# Patient Record
Sex: Female | Born: 1955
Health system: Southern US, Community
[De-identification: ages and names within clinical notes are randomized; demographics above are authoritative.]

## PROBLEM LIST (undated history)

## (undated) DIAGNOSIS — M25569 Pain in unspecified knee: Secondary | ICD-10-CM

## (undated) DIAGNOSIS — J45909 Unspecified asthma, uncomplicated: Secondary | ICD-10-CM

## (undated) DIAGNOSIS — E119 Type 2 diabetes mellitus without complications: Secondary | ICD-10-CM

## (undated) DIAGNOSIS — Z8719 Personal history of other diseases of the digestive system: Secondary | ICD-10-CM

## (undated) DIAGNOSIS — I5189 Other ill-defined heart diseases: Secondary | ICD-10-CM

## (undated) DIAGNOSIS — M722 Plantar fascial fibromatosis: Secondary | ICD-10-CM

## (undated) DIAGNOSIS — I10 Essential (primary) hypertension: Secondary | ICD-10-CM

## (undated) DIAGNOSIS — M797 Fibromyalgia: Secondary | ICD-10-CM

## (undated) DIAGNOSIS — K8021 Calculus of gallbladder without cholecystitis with obstruction: Secondary | ICD-10-CM

## (undated) DIAGNOSIS — I509 Heart failure, unspecified: Secondary | ICD-10-CM

## (undated) DIAGNOSIS — G4730 Sleep apnea, unspecified: Secondary | ICD-10-CM

## (undated) DIAGNOSIS — O24419 Gestational diabetes mellitus in pregnancy, unspecified control: Secondary | ICD-10-CM

## (undated) DIAGNOSIS — Z79899 Other long term (current) drug therapy: Secondary | ICD-10-CM

## (undated) DIAGNOSIS — I499 Cardiac arrhythmia, unspecified: Secondary | ICD-10-CM

## (undated) DIAGNOSIS — I4891 Unspecified atrial fibrillation: Secondary | ICD-10-CM

## (undated) DIAGNOSIS — I4819 Other persistent atrial fibrillation: Secondary | ICD-10-CM

## (undated) DIAGNOSIS — M549 Dorsalgia, unspecified: Secondary | ICD-10-CM

## (undated) DIAGNOSIS — Z5181 Encounter for therapeutic drug level monitoring: Secondary | ICD-10-CM

## (undated) DIAGNOSIS — R109 Unspecified abdominal pain: Secondary | ICD-10-CM

## (undated) DIAGNOSIS — Q2112 Patent foramen ovale: Secondary | ICD-10-CM

## (undated) DIAGNOSIS — M1990 Unspecified osteoarthritis, unspecified site: Secondary | ICD-10-CM

## (undated) DIAGNOSIS — J189 Pneumonia, unspecified organism: Secondary | ICD-10-CM

## (undated) DIAGNOSIS — K8020 Calculus of gallbladder without cholecystitis without obstruction: Secondary | ICD-10-CM

## (undated) DIAGNOSIS — Z87442 Personal history of urinary calculi: Secondary | ICD-10-CM

## (undated) DIAGNOSIS — K219 Gastro-esophageal reflux disease without esophagitis: Secondary | ICD-10-CM

## (undated) HISTORY — DX: Unspecified asthma, uncomplicated: J45.909

## (undated) HISTORY — PX: APPENDECTOMY: SHX54

## (undated) HISTORY — PX: ABDOMINAL HYSTERECTOMY: SHX81

## (undated) HISTORY — DX: Unspecified osteoarthritis, unspecified site: M19.90

## (undated) HISTORY — DX: Calculus of gallbladder without cholecystitis without obstruction: K80.20

## (undated) HISTORY — DX: Dorsalgia, unspecified: M54.9

## (undated) HISTORY — DX: Pain in unspecified knee: M25.569

## (undated) HISTORY — DX: Unspecified atrial fibrillation: I48.91

## (undated) HISTORY — DX: Heart failure, unspecified: I50.9

## (undated) HISTORY — DX: Unspecified abdominal pain: R10.9

## (undated) HISTORY — DX: Other persistent atrial fibrillation: I48.19

## (undated) HISTORY — DX: Calculus of gallbladder without cholecystitis with obstruction: K80.21

## (undated) HISTORY — DX: Patent foramen ovale: Q21.12

---

## 2000-08-01 ENCOUNTER — Other Ambulatory Visit: Admission: RE | Admit: 2000-08-01 | Discharge: 2000-08-01 | Payer: Self-pay | Admitting: *Deleted

## 2001-06-07 HISTORY — PX: ABDOMINAL HYSTERECTOMY: SHX81

## 2015-02-22 ENCOUNTER — Emergency Department (HOSPITAL_COMMUNITY): Payer: Medicare Other

## 2015-02-22 ENCOUNTER — Inpatient Hospital Stay (HOSPITAL_COMMUNITY)
Admission: EM | Admit: 2015-02-22 | Discharge: 2015-02-26 | DRG: 418 | Disposition: A | Discharge status: Home / Self Care | Payer: Medicare Other | Attending: Internal Medicine | Admitting: Internal Medicine

## 2015-02-22 ENCOUNTER — Encounter (HOSPITAL_COMMUNITY): Payer: Self-pay | Admitting: Emergency Medicine

## 2015-02-22 DIAGNOSIS — K8020 Calculus of gallbladder without cholecystitis without obstruction: Secondary | ICD-10-CM

## 2015-02-22 DIAGNOSIS — Z9049 Acquired absence of other specified parts of digestive tract: Secondary | ICD-10-CM | POA: Diagnosis not present

## 2015-02-22 DIAGNOSIS — Z01810 Encounter for preprocedural cardiovascular examination: Secondary | ICD-10-CM

## 2015-02-22 DIAGNOSIS — I481 Persistent atrial fibrillation: Secondary | ICD-10-CM | POA: Diagnosis not present

## 2015-02-22 DIAGNOSIS — R1011 Right upper quadrant pain: Secondary | ICD-10-CM | POA: Diagnosis not present

## 2015-02-22 DIAGNOSIS — I480 Paroxysmal atrial fibrillation: Secondary | ICD-10-CM | POA: Diagnosis present

## 2015-02-22 DIAGNOSIS — M797 Fibromyalgia: Secondary | ICD-10-CM | POA: Diagnosis not present

## 2015-02-22 DIAGNOSIS — M179 Osteoarthritis of knee, unspecified: Secondary | ICD-10-CM | POA: Diagnosis present

## 2015-02-22 DIAGNOSIS — R339 Retention of urine, unspecified: Secondary | ICD-10-CM | POA: Diagnosis not present

## 2015-02-22 DIAGNOSIS — K5900 Constipation, unspecified: Secondary | ICD-10-CM | POA: Diagnosis not present

## 2015-02-22 DIAGNOSIS — R109 Unspecified abdominal pain: Secondary | ICD-10-CM | POA: Insufficient documentation

## 2015-02-22 DIAGNOSIS — K8001 Calculus of gallbladder with acute cholecystitis with obstruction: Secondary | ICD-10-CM | POA: Diagnosis present

## 2015-02-22 DIAGNOSIS — K8021 Calculus of gallbladder without cholecystitis with obstruction: Secondary | ICD-10-CM | POA: Insufficient documentation

## 2015-02-22 DIAGNOSIS — R030 Elevated blood-pressure reading, without diagnosis of hypertension: Secondary | ICD-10-CM | POA: Diagnosis present

## 2015-02-22 DIAGNOSIS — Z90710 Acquired absence of both cervix and uterus: Secondary | ICD-10-CM

## 2015-02-22 DIAGNOSIS — I4891 Unspecified atrial fibrillation: Secondary | ICD-10-CM | POA: Diagnosis not present

## 2015-02-22 DIAGNOSIS — Z6841 Body Mass Index (BMI) 40.0 and over, adult: Secondary | ICD-10-CM

## 2015-02-22 DIAGNOSIS — L299 Pruritus, unspecified: Secondary | ICD-10-CM | POA: Diagnosis not present

## 2015-02-22 DIAGNOSIS — I4892 Unspecified atrial flutter: Secondary | ICD-10-CM | POA: Diagnosis present

## 2015-02-22 HISTORY — DX: Calculus of gallbladder without cholecystitis without obstruction: K80.20

## 2015-02-22 HISTORY — DX: Fibromyalgia: M79.7

## 2015-02-22 HISTORY — DX: Gestational diabetes mellitus in pregnancy, unspecified control: O24.419

## 2015-02-22 LAB — CBC
HCT: 42.2 % (ref 36.0–46.0)
Hemoglobin: 14.3 g/dL (ref 12.0–15.0)
MCH: 30.8 pg (ref 26.0–34.0)
MCHC: 33.9 g/dL (ref 30.0–36.0)
MCV: 90.9 fL (ref 78.0–100.0)
PLATELETS: 230 10*3/uL (ref 150–400)
RBC: 4.64 MIL/uL (ref 3.87–5.11)
RDW: 12.7 % (ref 11.5–15.5)
WBC: 8.8 10*3/uL (ref 4.0–10.5)

## 2015-02-22 LAB — URINALYSIS, ROUTINE W REFLEX MICROSCOPIC
BILIRUBIN URINE: NEGATIVE
Glucose, UA: NEGATIVE mg/dL
Hgb urine dipstick: NEGATIVE
KETONES UR: 15 mg/dL — AB
LEUKOCYTES UA: NEGATIVE
NITRITE: NEGATIVE
PROTEIN: NEGATIVE mg/dL
Specific Gravity, Urine: 1.018 (ref 1.005–1.030)
UROBILINOGEN UA: 0.2 mg/dL (ref 0.0–1.0)
pH: 6.5 (ref 5.0–8.0)

## 2015-02-22 LAB — MRSA PCR SCREENING: MRSA BY PCR: NEGATIVE

## 2015-02-22 LAB — COMPREHENSIVE METABOLIC PANEL
ALT: 25 U/L (ref 14–54)
AST: 30 U/L (ref 15–41)
Albumin: 4.2 g/dL (ref 3.5–5.0)
Alkaline Phosphatase: 58 U/L (ref 38–126)
Anion gap: 7 (ref 5–15)
BILIRUBIN TOTAL: 0.7 mg/dL (ref 0.3–1.2)
BUN: 8 mg/dL (ref 6–20)
CHLORIDE: 106 mmol/L (ref 101–111)
CO2: 25 mmol/L (ref 22–32)
CREATININE: 0.84 mg/dL (ref 0.44–1.00)
Calcium: 9.3 mg/dL (ref 8.9–10.3)
Glucose, Bld: 149 mg/dL — ABNORMAL HIGH (ref 65–99)
Potassium: 3.7 mmol/L (ref 3.5–5.1)
Sodium: 138 mmol/L (ref 135–145)
TOTAL PROTEIN: 8.5 g/dL — AB (ref 6.5–8.1)

## 2015-02-22 LAB — LIPASE, BLOOD: LIPASE: 26 U/L (ref 22–51)

## 2015-02-22 LAB — TSH: TSH: 1.26 u[IU]/mL (ref 0.350–4.500)

## 2015-02-22 MED ORDER — SODIUM CHLORIDE 0.9 % IV BOLUS (SEPSIS)
1000.0000 mL | Freq: Once | INTRAVENOUS | Status: AC
  Administered 2015-02-22: 1000 mL via INTRAVENOUS

## 2015-02-22 MED ORDER — SODIUM CHLORIDE 0.9 % IV SOLN
INTRAVENOUS | Status: DC
  Administered 2015-02-25: 1000 mL via INTRAVENOUS

## 2015-02-22 MED ORDER — KETOROLAC TROMETHAMINE 30 MG/ML IJ SOLN
30.0000 mg | Freq: Once | INTRAMUSCULAR | Status: AC
  Administered 2015-02-22: 30 mg via INTRAVENOUS
  Filled 2015-02-22: qty 1

## 2015-02-22 MED ORDER — HEPARIN (PORCINE) IN NACL 100-0.45 UNIT/ML-% IJ SOLN
1700.0000 [IU]/h | INTRAMUSCULAR | Status: DC
  Administered 2015-02-22: 1200 [IU]/h via INTRAVENOUS
  Administered 2015-02-23: 1500 [IU]/h via INTRAVENOUS
  Administered 2015-02-24: 1700 [IU]/h via INTRAVENOUS
  Filled 2015-02-22 (×3): qty 250

## 2015-02-22 MED ORDER — ONDANSETRON 4 MG PO TBDP
ORAL_TABLET | ORAL | Status: AC
  Filled 2015-02-22: qty 1

## 2015-02-22 MED ORDER — SODIUM CHLORIDE 0.9 % IJ SOLN
3.0000 mL | Freq: Two times a day (BID) | INTRAMUSCULAR | Status: DC
  Administered 2015-02-23 – 2015-02-25 (×5): 3 mL via INTRAVENOUS

## 2015-02-22 MED ORDER — DILTIAZEM LOAD VIA INFUSION
10.0000 mg | Freq: Once | INTRAVENOUS | Status: DC
  Filled 2015-02-22: qty 10

## 2015-02-22 MED ORDER — ONDANSETRON 4 MG PO TBDP
4.0000 mg | ORAL_TABLET | Freq: Once | ORAL | Status: AC | PRN
  Administered 2015-02-22: 4 mg via ORAL

## 2015-02-22 MED ORDER — HYDROMORPHONE HCL 1 MG/ML IJ SOLN
1.0000 mg | Freq: Once | INTRAMUSCULAR | Status: AC
  Administered 2015-02-22: 1 mg via INTRAVENOUS
  Filled 2015-02-22: qty 1

## 2015-02-22 MED ORDER — MORPHINE SULFATE (PF) 4 MG/ML IV SOLN: 4.0000 mg | INTRAVENOUS | Status: DC | PRN

## 2015-02-22 MED ORDER — MORPHINE SULFATE (PF) 2 MG/ML IV SOLN: 1.0000 mg | INTRAVENOUS | Status: DC | PRN

## 2015-02-22 MED ORDER — MORPHINE SULFATE (PF) 4 MG/ML IV SOLN
4.0000 mg | Freq: Once | INTRAVENOUS | Status: AC
  Administered 2015-02-22: 4 mg via INTRAVENOUS
  Filled 2015-02-22: qty 1

## 2015-02-22 MED ORDER — DIPHENHYDRAMINE HCL 50 MG/ML IJ SOLN
25.0000 mg | Freq: Once | INTRAMUSCULAR | Status: AC
  Administered 2015-02-22: 25 mg via INTRAVENOUS
  Filled 2015-02-22: qty 1

## 2015-02-22 MED ORDER — ONDANSETRON HCL 4 MG/2ML IJ SOLN
4.0000 mg | Freq: Four times a day (QID) | INTRAMUSCULAR | Status: DC | PRN
  Administered 2015-02-23 – 2015-02-25 (×7): 4 mg via INTRAVENOUS
  Filled 2015-02-22 (×7): qty 2

## 2015-02-22 MED ORDER — KETOROLAC TROMETHAMINE 30 MG/ML IJ SOLN
30.0000 mg | Freq: Four times a day (QID) | INTRAMUSCULAR | Status: DC | PRN
  Administered 2015-02-22 – 2015-02-24 (×4): 30 mg via INTRAVENOUS
  Filled 2015-02-22 (×4): qty 1

## 2015-02-22 MED ORDER — HEPARIN BOLUS VIA INFUSION
4000.0000 [IU] | Freq: Once | INTRAVENOUS | Status: DC
  Filled 2015-02-22: qty 4000

## 2015-02-22 MED ORDER — DILTIAZEM HCL 25 MG/5ML IV SOLN
10.0000 mg | Freq: Once | INTRAVENOUS | Status: AC
  Administered 2015-02-22: 10 mg via INTRAVENOUS
  Filled 2015-02-22: qty 5

## 2015-02-22 MED ORDER — ONDANSETRON HCL 4 MG/2ML IJ SOLN
4.0000 mg | Freq: Once | INTRAMUSCULAR | Status: AC
  Administered 2015-02-22: 4 mg via INTRAVENOUS
  Filled 2015-02-22: qty 2

## 2015-02-22 MED ORDER — DILTIAZEM HCL 100 MG IV SOLR
5.0000 mg/h | INTRAVENOUS | Status: DC
  Administered 2015-02-22 (×2): 10 mg/h via INTRAVENOUS
  Administered 2015-02-22 (×2): 5 mg/h via INTRAVENOUS
  Administered 2015-02-22: 15 mg/h via INTRAVENOUS
  Administered 2015-02-23: 10 mg/h via INTRAVENOUS
  Filled 2015-02-22 (×2): qty 100

## 2015-02-22 MED ORDER — ONDANSETRON HCL 4 MG PO TABS
4.0000 mg | ORAL_TABLET | Freq: Four times a day (QID) | ORAL | Status: DC | PRN
  Administered 2015-02-26: 4 mg via ORAL
  Filled 2015-02-22: qty 1

## 2015-02-22 MED ORDER — DEXTROSE 5 % IV SOLN
1.0000 g | INTRAVENOUS | Status: DC
  Administered 2015-02-22 – 2015-02-23 (×2): 1 g via INTRAVENOUS
  Filled 2015-02-22 (×3): qty 10

## 2015-02-22 MED ORDER — PROMETHAZINE HCL 25 MG/ML IJ SOLN
12.5000 mg | Freq: Once | INTRAMUSCULAR | Status: AC
  Administered 2015-02-24: 12.5 mg via INTRAVENOUS
  Filled 2015-02-22 (×2): qty 1

## 2015-02-22 MED ORDER — METOPROLOL TARTRATE 1 MG/ML IV SOLN
10.0000 mg | Freq: Once | INTRAVENOUS | Status: AC
  Administered 2015-02-22: 10 mg via INTRAVENOUS
  Filled 2015-02-22: qty 10

## 2015-02-22 MED ORDER — MORPHINE SULFATE (PF) 2 MG/ML IV SOLN
1.0000 mg | INTRAVENOUS | Status: DC | PRN
  Administered 2015-02-23 – 2015-02-24 (×2): 2 mg via INTRAVENOUS
  Filled 2015-02-22 (×2): qty 1

## 2015-02-22 NOTE — ED Notes (Signed)
Pt HR reading 130 on monitor; pt placed on 12 lead and EKG captured, shown to MD. No recommendations at this time. Pt denies CP and SOB.

## 2015-02-22 NOTE — Consult Note (Signed)
Reason for Consult:abdominal pain, n/v Referring Physician: Cyrilla, [facility] Atkinson is an 59 y.o. female.  HPI: 59 yo obese WF with fibromyalgia awoke around 0300 with RUQ/epigastric pain that was constant that worsened throughout morning prompting her to come to [facility] ED. Pain was severe. Some radiation to back. No f/c. +n/V in ED. No prior symptoms. Normal BMs. No weight loss. No frequent NSAID use. No alcoholic stools. No melena/hematochezia. Late this pm, pt went into rapid Afib with RVR.   History reviewed. No pertinent past medical history.  Past Surgical History  Procedure Laterality Date  . Abdominal hysterectomy    . Appendectomy      History reviewed. No pertinent family history.  Social History:  reports that she has never smoked. She does not have any smokeless tobacco history on file. She reports that she drinks alcohol. She reports that she does not use illicit drugs.  Allergies:  Allergies  Allergen Reactions  . Other     Metals  . Sulfur     Medications: I have reviewed [facility] Atkinson's current medications.  Results for orders placed or performed during [facility] hospital encounter of 02/22/15 (from [facility] past 48 hour(s))  Urinalysis, Routine w reflex microscopic (not at [facility])     Status: Abnormal   Collection Time: 02/22/15 11:59 AM  Result Value Ref Range   Color, Urine YELLOW YELLOW   APPearance CLEAR CLEAR   Specific Gravity, Urine 1.018 1.005 - 1.030   pH 6.5 5.0 - 8.0   Glucose, UA NEGATIVE NEGATIVE mg/dL   Hgb urine dipstick NEGATIVE NEGATIVE   Bilirubin Urine NEGATIVE NEGATIVE   Ketones, ur 15 (A) NEGATIVE mg/dL   Protein, ur NEGATIVE NEGATIVE mg/dL   Urobilinogen, UA 0.2 0.0 - 1.0 mg/dL   Nitrite NEGATIVE NEGATIVE   Leukocytes, UA NEGATIVE NEGATIVE    Comment: MICROSCOPIC NOT DONE ON URINES WITH NEGATIVE PROTEIN, BLOOD, LEUKOCYTES, NITRITE, OR GLUCOSE <1000 mg/dL.  Lipase, blood     Status: None   Collection Time: 02/22/15 12:15 PM  Result Value Ref  Range   Lipase 26 22 - 51 U/L  Comprehensive metabolic panel     Status: Abnormal   Collection Time: 02/22/15 12:15 PM  Result Value Ref Range   Sodium 138 135 - 145 mmol/L   Potassium 3.7 3.5 - 5.1 mmol/L   Chloride 106 101 - 111 mmol/L   CO2 25 22 - 32 mmol/L   Glucose, Bld 149 (H) 65 - 99 mg/dL   BUN 8 6 - 20 mg/dL   Creatinine, Ser 0.84 0.44 - 1.00 mg/dL   Calcium 9.3 8.9 - 10.3 mg/dL   Total Protein 8.5 (H) 6.5 - 8.1 g/dL   Albumin 4.2 3.5 - 5.0 g/dL   AST 30 15 - 41 U/L   ALT 25 14 - 54 U/L   Alkaline Phosphatase 58 38 - 126 U/L   Total Bilirubin 0.7 0.3 - 1.2 mg/dL   GFR calc non Af Amer >60 >60 mL/min   GFR calc Af Amer >60 >60 mL/min    Comment: (NOTE) Christie eGFR has been calculated using Christie CKD EPI equation. This calculation has not been validated in all clinical situations. eGFR's persistently <60 mL/min signify possible Chronic Kidney Disease.    Anion gap 7 5 - 15  CBC     Status: None   Collection Time: 02/22/15 12:15 PM  Result Value Ref Range   WBC 8.8 4.0 - 10.5 K/uL   RBC 4.64 3.87 -  5.11 MIL/uL   Hemoglobin 14.3 12.0 - 15.0 g/dL   HCT 42.2 36.0 - 46.0 %   MCV 90.9 78.0 - 100.0 fL   MCH 30.8 26.0 - 34.0 pg   MCHC 33.9 30.0 - 36.0 g/dL   RDW 12.7 11.5 - 15.5 %   Platelets 230 150 - 400 K/uL    Dg Abd 1 View  02/22/2015   CLINICAL DATA:  Abdominal pain. Pt c/o generalized abdominal pain, nausea, of vomiting of yellow substance x today. Hx appendectomy, hysterectomy and liver biopsy.  EXAM: ABDOMEN - 1 VIEW  COMPARISON:  CT 10/20/2010  FINDINGS: Small bowel decompressed. Moderate proximal colonic fecal material, decompressed distally. Surgical clips in Christie left pelvis.  No abnormal abdominal calcifications.  Mild degenerative changes in Christie lower lumbar spine.  IMPRESSION: 1. Nonobstructive bowel gas pattern.   Electronically Signed   By: Lucrezia Europe M.D.   On: 02/22/2015 15:08   US Abdomen Complete  02/22/2015   CLINICAL DATA:  Pain right upper quadrant  radiating to left upper quadrant right flank since 3 a.m. with nausea and vomiting  EXAM: ULTRASOUND ABDOMEN COMPLETE  COMPARISON:  None.  FINDINGS: Gallbladder: 18 mm fell lodged in Christie neck of Christie gallbladder. Gallbladder wall is not thickened and there is no Murphy's sign.  Common bile duct: Diameter: 5 mm  Liver: Fatty infiltration  IVC: No abnormality visualized.  Pancreas: Visualized portion unremarkable.  Spleen: Size and appearance within normal limits.  Right Kidney: Length: 13.6 cm. Echogenicity within normal limits. No mass or hydronephrosis visualized.  Left Kidney: Length: 13.3 cm. Echogenicity within normal limits. No mass or hydronephrosis visualized.  Abdominal aorta: No aneurysm visualized.  Other findings: None.  IMPRESSION: Cholelithiasis with nonmobile stone lodged in Christie neck of Christie gallbladder.   Electronically Signed   By: Skipper Cliche M.D.   On: 02/22/2015 15:15    Review of Systems  Constitutional: Negative for fever, chills and weight loss.  HENT: Negative for nosebleeds.   Eyes: Negative for blurred vision.       Spots when have migraines   Respiratory: Positive for shortness of breath.   Cardiovascular: Negative for chest pain, palpitations, orthopnea and PND.       Denies DOE  Gastrointestinal: Positive for nausea, vomiting and abdominal pain. Negative for diarrhea and constipation.  Genitourinary: Negative for dysuria and hematuria.       Has "very large" bladder and "will eventually lose ability to empty bladder". No self cath   Musculoskeletal: Positive for joint pain (has b/l knee pain).       Has fibromyalgia; got injections in knees recently  Skin: Negative for itching and rash.  Neurological: Negative for dizziness, focal weakness, seizures, loss of consciousness and headaches.       Denies TIAs, amaurosis fugax; +h/o migraines  Endo/Heme/Allergies: Does not bruise/bleed easily.  Psychiatric/Behavioral: Christie Atkinson is not nervous/anxious.    Blood pressure  140/94, pulse 102, temperature 97.7 F (36.5 C), temperature source Oral, resp. rate 18, height 5' 6.5" (1.689 m), weight 117.935 kg (260 lb), SpO2 94 %. Physical Exam  Vitals reviewed. Constitutional: She is oriented to person, place, and time. She appears well-developed and well-nourished. No distress.  obese  HENT:  Head: Normocephalic and atraumatic.  Right Ear: External ear normal.  Left Ear: External ear normal.  Eyes: Conjunctivae are normal. No scleral icterus.  Neck: Normal range of motion. Neck supple. No tracheal deviation present. No thyromegaly present.  Cardiovascular: Normal heart sounds.  An irregular rhythm present. Tachycardia present.   Respiratory: Effort normal and breath sounds normal. No stridor. No respiratory distress. She has no wheezes.  GI: Soft. Normal appearance. There is no rigidity, no rebound and no guarding. No hernia.  Very MILD TTP in RUQ. No rt/guarding. Old transverse incision  Musculoskeletal: She exhibits no edema or tenderness.  Lymphadenopathy:    She has no cervical adenopathy.  Neurological: She is alert and oriented to person, place, and time. She exhibits normal muscle tone.  Skin: Skin is warm and dry. No rash noted. She is not diaphoretic. No erythema. No pallor.  Psychiatric: She has a normal mood and affect. Her behavior is normal. Judgment and thought content normal.    Assessment/Plan: Symptomatic cholelithiasis vs acute calculous cholecystitis New onset Afib Morbid obesity Fibromyalgia Anticoagulated  Unclear if pt has acute cholecystitis. No wall thickening, fluid on u/s. Normal labs. Min tenderness on exam. Could be early acute cholecystitis.  Unclear why pt developed afib. -?cholecystitis For now, would cover for cholecystitis with IV rocephin Need work up for afib and cardiology consult Need cardiology to weigh in Can always check HIDA to confirm cholecystitis Can have clears from my standpoint. Would make NPO after  midnight for possible HIDA scan Sunday Repeat labs in am Right now not anticipating cholecystectomy on Sunday  Will follow  Leighton Ruff. Redmond Pulling, MD, FACS General, Bariatric, & Minimally Invasive Surgery [facility], [place]   [facility] M 02/22/2015, 7:18 PM

## 2015-02-22 NOTE — Progress Notes (Signed)
Ok to run cardizem and heparin together if connected at lowest point.

## 2015-02-22 NOTE — ED Provider Notes (Signed)
CSN: [id number]     Arrival date & time 02/22/15  1133 History   First MD Initiated Contact with Patient 02/22/15 1214     Chief Complaint  Patient presents with  . Abdominal Pain     (Consider location/radiation/quality/duration/timing/severity/associated sxs/prior Treatment) The history is provided by the patient.     Patient is a 59 year-old female with history of abdominal hysterectomy, appendectomy, distended and dysfunctional bladder, fibromyalgia, who presents to the emergency room with abdominal pain, nausea, vomiting and diarrhea. She states that yesterday she had dark diarrhea but she denies hematochezia, melena. At approximately 4 AM today, she woke up with sudden onset right upper quadrant abdominal pain that radiated across her abdomen and to her back. The pain has been constant since it's onset and associated with abdominal distention and sweats.  She has gotten some relief with standing and leaning over forward.  She has not eaten since the onset of the abdominal pain and she has never experienced anything like this before. She states that her vomiting was yellow, she denies hematemesis.   She complains of urinary retention which is chronic due to bladder sling complication. She states that she never has complete bladder emptying and does feel very distended currently. She has been told that she will need to catheter herself eventually, and she is currently requesting a Foley catheter be placed, and also requesting a urology consult.  She denies dysuria and hematuria. The patient claimed that she has not passed gas since yesterday and has not had a bowel movement since yesterday.   She denies any chest pain, shortness of breath, fever, chills, sweats.  She denies any sick contacts. She states that she has had some dyspepsia and bloating for several months, but she has not yet seen a physician to have it evaluated.  She has not other complaints at this time.  History reviewed. No  pertinent past medical history. Past Surgical History  Procedure Laterality Date  . Abdominal hysterectomy    . Appendectomy     History reviewed. No pertinent family history. Social History  Substance Use Topics  . Smoking status: Never Smoker   . Smokeless tobacco: None  . Alcohol Use: Yes     Comment: little   OB History    No data available     Review of Systems  Constitutional: Negative.   HENT: Negative.   Eyes: Negative.   Respiratory: Negative.  Negative for cough, chest tightness, shortness of breath and wheezing.   Cardiovascular: Negative.  Negative for chest pain, palpitations and leg swelling.  Endocrine: Negative.   Genitourinary: Positive for difficulty urinating. Negative for dysuria, urgency, frequency, hematuria, flank pain, pelvic pain and dyspareunia.  Musculoskeletal: Positive for myalgias.  Skin: Negative.   Neurological: Negative for dizziness, tremors, syncope, weakness, light-headedness and headaches.  Hematological: Negative.   Psychiatric/Behavioral: Negative.       Allergies  Other and Sulfur  Home Medications   Prior to Admission medications   Not on File   BP 144/101 mmHg  Pulse 53  Temp(Src) 97.7 F (36.5 C) (Oral)  Resp 18  Ht 5' 6.5" (1.689 m)  Wt 260 lb (117.935 kg)  BMI 41.34 kg/m2  SpO2 94% Physical Exam  Constitutional: She is oriented to person, place, and time. Vital signs are normal. She appears well-developed and well-nourished. She is cooperative. No distress.  Obese female, appears stated age, appears uncomfortable writhing around in bed then standing and leaning over bed, moaning, NAD  HENT:  Head: Normocephalic and atraumatic.  Nose: Nose normal.  Mouth/Throat: Oropharynx is clear and moist. No oropharyngeal exudate.  Eyes: Conjunctivae and EOM are normal. Pupils are equal, round, and reactive to light. Right eye exhibits no discharge. Left eye exhibits no discharge. No scleral icterus.  Neck: Normal range of  motion. No JVD present. No tracheal deviation present. No thyromegaly present.  Cardiovascular: Normal rate, regular rhythm, normal heart sounds and intact distal pulses.  Exam reveals no gallop and no friction rub.   No murmur heard. Pulses:      Radial pulses are 2+ on the right side, and 2+ on the left side.       Dorsalis pedis pulses are 2+ on the right side, and 2+ on the left side.  No LE edema, symmetrical pulses radial and DP 2+, regular rate and rhythm.    Pulmonary/Chest: Effort normal and breath sounds normal. No respiratory distress. She has no wheezes. She has no rales. She exhibits no tenderness.  Abdominal: Soft. Bowel sounds are normal. She exhibits no mass. There is tenderness in the right upper quadrant, epigastric area and left upper quadrant. There is guarding. There is no rigidity, no rebound, no CVA tenderness and negative Murphy's sign. No hernia.  Abdomen obese, mildly distended, exam limited by body habitus  Musculoskeletal: Normal range of motion. She exhibits no edema or tenderness.  Lymphadenopathy:    She has no cervical adenopathy.  Neurological: She is alert and oriented to person, place, and time. She has normal reflexes. No cranial nerve deficit. She exhibits normal muscle tone. Coordination normal.  Skin: Skin is warm and dry. No rash noted. She is not diaphoretic. No cyanosis or erythema. No pallor. Nails show no clubbing.  Psychiatric: She has a normal mood and affect. Her behavior is normal. Judgment and thought content normal.  Nursing note and vitals reviewed.     ED Course  Procedures (including critical care time) Labs Review Labs Reviewed  COMPREHENSIVE METABOLIC PANEL - Abnormal; Notable for the following:    Glucose, Bld 149 (*)    Total Protein 8.5 (*)    All other components within normal limits  URINALYSIS, ROUTINE W REFLEX MICROSCOPIC (NOT AT [facility]) - Abnormal; Notable for the following:    Ketones, ur 15 (*)    All other components within  normal limits  LIPASE, BLOOD  CBC    Imaging Review Dg Abd 1 View  02/22/2015   CLINICAL DATA:  Abdominal pain. Pt c/o generalized abdominal pain, nausea, of vomiting of yellow substance x today. Hx appendectomy, hysterectomy and liver biopsy.  EXAM: ABDOMEN - 1 VIEW  COMPARISON:  CT 10/20/2010  FINDINGS: Small bowel decompressed. Moderate proximal colonic fecal material, decompressed distally. Surgical clips in the left pelvis.  No abnormal abdominal calcifications.  Mild degenerative changes in the lower lumbar spine.  IMPRESSION: 1. Nonobstructive bowel gas pattern.   Electronically Signed   By: Lucrezia Europe M.D.   On: 02/22/2015 15:08   US Abdomen Complete  02/22/2015   CLINICAL DATA:  Pain right upper quadrant radiating to left upper quadrant right flank since 3 a.m. with nausea and vomiting  EXAM: ULTRASOUND ABDOMEN COMPLETE  COMPARISON:  None.  FINDINGS: Gallbladder: 18 mm fell lodged in the neck of the gallbladder. Gallbladder wall is not thickened and there is no Murphy's sign.  Common bile duct: Diameter: 5 mm  Liver: Fatty infiltration  IVC: No abnormality visualized.  Pancreas: Visualized portion unremarkable.  Spleen: Size and appearance within normal  limits.  Right Kidney: Length: 13.6 cm. Echogenicity within normal limits. No mass or hydronephrosis visualized.  Left Kidney: Length: 13.3 cm. Echogenicity within normal limits. No mass or hydronephrosis visualized.  Abdominal aorta: No aneurysm visualized.  Other findings: None.  IMPRESSION: Cholelithiasis with nonmobile stone lodged in the neck of the gallbladder.   Electronically Signed   By: Skipper Cliche M.D.   On: 02/22/2015 15:15   I have personally reviewed and evaluated these images and lab results as part of my medical decision-making.   EKG Interpretation   Date/Time:  Saturday February 22 2015 15:46:59 EDT Ventricular Rate:  133 PR Interval:    QRS Duration: 87 QT Interval:  325 QTC Calculation: 483 R Axis:   88 Text  Interpretation:  AtTrial fibrillation with RVR  No prior EKG for  comparison Confirmed by LIU MD, DANA [phone]) on 02/22/2015 3:51:28 PM      MDM   Final diagnoses:  Abdominal pain  Calculus of gallbladder with biliary obstruction but without cholecystitis    Abdominal pain, sudden onset this am, constant with N/V Abdominal x-ray reviewed normal gas bowel patterns, no obstruction  Abdominal ultrasound revealed nonmobile stone in gallbladder neck, measuring 18 mm, without gallbladder thickening, negative Murphy's sign.   Labs were largely unremarkable, the patient does not have a white count, electrolytes are within normal limits, no elevated LFTs, normal lipase UA negative for UTI The patient continued to have abdominal pain with nausea and vomiting, she was given another dose of morphine and Zofran.  Vitals at 1545 revealed tachycardia (which was new, pt had previously had regular pulse recorded and palpated in the 50-60's) patient was subsequently placed a 5-lead and EKG obtained which showed Afib with RVR, patient did feel palpitations and lightheadedness, but denies chest pain, blood pressure is stable, 135/89, respirations 14, unlabored The patient denies any cardiac history.  Another liter IV fluids was administered with more aggressive pain and nausea treatment.  Will observe the pt to see if she spontaneously converts.  Dilaudid cause pruritis, pt given benadryl for itch Pt had no improvement in HR/rhythm, after an hour of fluids, the pt was sleeping with HR 122-141 and BP 143/104 - 144/101. Pt's was discussed with Dr. Oleta Mouse, the new attending on shift, will add diltiazem.  Calls out to general surgery for cholelithiasis/early cholecystitis with nonmobile/lodged gallstone Also called unassigned for admission for new onset A. fib, pain and nausea control. They may need to assist with serial abdominal exams to watch for progression of cholecystitis.  Dr. Redmond Pulling from surgery will  consult Internal medicine to admit for new onset A. fib, RUQ abdominal pain, Veneda Melter, PA-C 02/24/15 [place], DO 02/26/15 [id number]

## 2015-02-22 NOTE — Consult Note (Addendum)
CARDIOLOGY CONSULT NOTE   Patient ID: Christie Atkinson MRN: [id number], DOB/AGE: 06-Nov-1955   Admit date: 02/22/2015 Date of Consult: 02/23/2015  Primary Physician: No primary care provider on file.  Reason for Consult:  Afib  HPI:  59 yo F w a h/o fibromyalgia & chronic urinary retention was admitted this evening with a 1 day h/o RUQ pain, N/V, & diarrhea, found to have evidence of an 18 mm obstructive gallstone in the biliary tract.  Per surgical consultation, it is unclear if her presentation is consistent with cholecystitis with a potential HIDA planned for tomorrow.  Though initial vitals did not reveal tachycardia, HR at 15:45 revealed a HR of 125, when an EKG was obtained & revealed Afib with RVR as well as nonspecific ST-T abnormalities.  She was asymptomatic & HD stable.  While in the ED, she was treated with Diltiazem 10 mg IV, Metoprolol 10 mg IV, Morphine 4 mg, Dilaudid 1 mg, Benadryl 25 mg IV, Zofran x 2, & NS x 2 L.  She is currently receiving a Diltiazem gtt at 5 mg/hr.    She was unaware of ever having Afib previously.  She stated that she last saw a cardiologist in the 2000's (unable to recall the name) for CP for which she underwent a stress test that was unremarkable.  The pain was ultimately attributed to pleurisy but has not recurred since then.  At baseline, her physical activity is limited by knee OA & Fibromyalgia for which she is on disability.  She is able to garden comfortably but has occasional dyspnea when climbing the stairs in her home.  This is chronic & stable.  Otherwise, she denied chest pain, palpitations, lightheadedness, lower extremity swelling, orthopnea, or paroxysmal nocturnal dyspnea.  She consumes 2 large cups of coffee daily but denied other stimulants.     Problem List  History reviewed. No pertinent past medical history.  Past Surgical History  Procedure Laterality Date  . Abdominal hysterectomy    . Appendectomy      Allergies  Allergies    Allergen Reactions  . Other     Metals  . Sulfur    Inpatient Medications  . cefTRIAXone (ROCEPHIN)  IV  1 g Intravenous Q24H  . diltiazem  10 mg Intravenous Once  . heparin  4,000 Units Intravenous Once  . promethazine  12.5 mg Intravenous Once  . sodium chloride  3 mL Intravenous Q12H   Family History History reviewed. No pertinent family history.   Social History Social History   Social History  . Marital Status: Married    Spouse Name: N/A  . Number of Children: N/A  . Years of Education: N/A   Occupational History  . Not on file.   Social History Main Topics  . Smoking status: Never Smoker   . Smokeless tobacco: Not on file  . Alcohol Use: Yes     Comment: little  . Drug Use: No  . Sexual Activity: Not on file   Other Topics Concern  . Not on file   Social History Narrative  . No narrative on file    Review of Systems:  A 12 point ROS was negative except as indicated in HPI.  Physical Exam  Blood pressure 101/61, pulse 94, temperature 98.6 F (37 C), temperature source Oral, resp. rate 14, height 5' 6.5" (1.689 m), weight 260 lb 5.8 oz (118.1 kg), SpO2 95 %.  General:  Pleasant, NAD Psych:  Normal affect. Neuro:  Alert and oriented  X 3. Moves all extremities spontaneously. HEENT:  Normal  Neck:   Supple without bruits or JVD. Lungs:   Resp regular and unlabored, CTA. Heart: I rregularly irregular, no s3, s4, or murmurs. Abdomen:  Soft, RUQ TTP, non-distended, BS + x 4.  Extremities: No clubbing, cyanosis or edema. DP/PT/Radials 2+ and equal bilaterally.  Labs  No results for input(s): CKTOTAL, CKMB, TROPONINI in the last 72 hours. Lab Results  Component Value Date   WBC 8.8 02/22/2015   HGB 14.3 02/22/2015   HCT 42.2 02/22/2015   MCV 90.9 02/22/2015   PLT 230 02/22/2015    Recent Labs Lab 02/22/15 1215  NA 138  K 3.7  CL 106  CO2 25  BUN 8  CREATININE 0.84  CALCIUM 9.3  PROT 8.5*  BILITOT 0.7  ALKPHOS 58  ALT 25  AST 30   GLUCOSE 149*   No results found for: CHOL, HDL, LDLCALC, TRIG No results found for: DDIMER  Radiology/Studies  Dg Abd 1 View  02/22/2015   CLINICAL DATA:  Abdominal pain. Pt c/o generalized abdominal pain, nausea, of vomiting of yellow substance x today. Hx appendectomy, hysterectomy and liver biopsy.  EXAM: ABDOMEN - 1 VIEW  COMPARISON:  CT 10/20/2010  FINDINGS: Small bowel decompressed. Moderate proximal colonic fecal material, decompressed distally. Surgical clips in the left pelvis.  No abnormal abdominal calcifications.  Mild degenerative changes in the lower lumbar spine.  IMPRESSION: 1. Nonobstructive bowel gas pattern.   Electronically Signed   By: Lucrezia Europe M.D.   On: 02/22/2015 15:08   US Abdomen Complete  02/22/2015   CLINICAL DATA:  Pain right upper quadrant radiating to left upper quadrant right flank since 3 a.m. with nausea and vomiting  EXAM: ULTRASOUND ABDOMEN COMPLETE  COMPARISON:  None.  FINDINGS: Gallbladder: 18 mm fell lodged in the neck of the gallbladder. Gallbladder wall is not thickened and there is no Murphy's sign.  Common bile duct: Diameter: 5 mm  Liver: Fatty infiltration  IVC: No abnormality visualized.  Pancreas: Visualized portion unremarkable.  Spleen: Size and appearance within normal limits.  Right Kidney: Length: 13.6 cm. Echogenicity within normal limits. No mass or hydronephrosis visualized.  Left Kidney: Length: 13.3 cm. Echogenicity within normal limits. No mass or hydronephrosis visualized.  Abdominal aorta: No aneurysm visualized.  Other findings: None.  IMPRESSION: Cholelithiasis with nonmobile stone lodged in the neck of the gallbladder.   Electronically Signed   By: Skipper Cliche M.D.   On: 02/22/2015 15:15   59 yo F w a h/o fibromyalgia & chronic urinary retention was admitted this evening with a 1 day h/o RUQ pain, N/V, & diarrhea, found to have evidence of an 18 mm obstructive gallstone in the biliary tract, course complicated by Afib.    # Atrial  fibrillation - This appears to have started during her time in the ED, likely related to catecholamine release from her pain.  CHA2DS2-VASc = 1 (based on gender).  Her TSH is wnl.   - Will plan for an echocardiogram tomorrow to rule-out structural disease contributing to her Afib.   - As long as her HR is controlled, the Afib would not preclude her from undergoing surgery. - Based on CHA2DS2-VASc = 1, she would be a candidate for ASA or anticoagulation.  Without either of these, she would have a 1.3% annual risk of CVA. - Overall, we would advise pain control & observing on telemetry while her abdominal pain is being further worked up during which time  the Heparin gtt can be continued.  If it is deemed that surgery is necessary, we can further discuss timing for discontinuation of the Heparin gtt.  Based on "Perioperative Bridging Anticoagulation in Patients with Atrial Fibrillation" (NEJM 2015), perioperative risk of arterial embolization in AF is <0.5%. - If Afib is persistent, she would not be a candidate for DCCV prior to surgery as this mandates 30 days of anticoagulation.  However, this could be considered after surgery once she is able to be anticoagulated. - Continue Heparin & Diltiazem gtts for now.   - This sounds complicated, but the overall message is to continue workup of her potential cholecystitis, & we will continue to follow with you as we collaborate to risk stratify her from an Afib & surgical perspective.  # Pre-operative Risk Assessment - Her Revised cardiac risk index = 0 for this intermidiate risk procedure, meaning that her risk of MACE is < 1%.  Her baseline METS are ~ 4.   - There would be no benefit to stress testing this patient.   - As per above, we will plan for a resting echocardiogram.    # Intermittent blood pressure elevation ED without a diagnosis of hypertension - This occurred in the ED & may have been related to pain.   - Will continue to  monitor.  Signed, Alfonso Ramus, MD 02/23/2015, 1:25 AM

## 2015-02-22 NOTE — ED Notes (Signed)
Pt reports woke up about 3 am with a bad smell in her room that caused her to feel nausea. Pt reports abdominal pain and back pain.

## 2015-02-22 NOTE — H&P (Signed)
Date: 02/22/2015               Patient Name:  Christie Atkinson MRN: [id number]  DOB: Jun 21, 1955 Age / Sex: 59 y.o., female   PCP: No primary care provider on file.         Medical Service: Internal Medicine Teaching Service         Attending Physician: Dr. Sid Falcon, MD    First Contact: Dr. Benjamine Mola Pager: [phone]  Second Contact: Dr. Genene Churn Pager: [phone]       After Hours (After 5p/  First Contact Pager: [phone]  weekends / holidays): Second Contact Pager: [phone]   Chief Complaint: Abdominal pain  History of Present Illness: 59 year old woman with history of gestational diabetes, total hysterectomy, appendectomy, osteoarthritis, fibromyalgia present to the emergency department with right upper quadrant abdominal pain, nausea, vomiting, and diarrhea. She was generally feeling well yesterday although did note a single episode of loose foul-smelling stool. She states that she woke up around 4 AM this morning to a malodorous smell and right upper quadrant pain. She felt significant abdominal pressure and attempted but was unable to pass a bowel movement. Pain was severe and accompanied by sweating. It is partially relieved by leaning forward. Since that time she has multiple episodes of vomiting mostly of yellow color and gastric contents. Denies any fevers chills or generalized pain except in her right upper quadrant and right flank. At the emergency department right upper quadrant ultrasound demonstrated an obstructing 18 mm gallstone in the biliary duct. She was then noted to go into A. fib with RVR with a pulse maximum of 153. Pulse improved with pain control with IV morphine and Dilaudid. Also Toradol and intravenous metoprolol administered with incomplete resolution. Heart rate of 110 and hemodynamically stable patient was started on diltiazem drip.  Meds: Current Facility-Administered Medications  Medication Dose Route Frequency Provider Last Rate Last Dose  . 0.9 %  sodium  chloride infusion   Intravenous Continuous Tasrif Ahmed, MD      . diltiazem (CARDIZEM) 1 mg/mL load via infusion 10 mg  10 mg Intravenous Once Tasrif Ahmed, MD      . diltiazem (CARDIZEM) 100 mg in dextrose 5 % 100 mL (1 mg/mL) infusion  5-15 mg/hr Intravenous Titrated Tasrif Ahmed, MD      . heparin ADULT infusion 100 units/mL (25000 units/250 mL)  1,200 Units/hr Intravenous Continuous Harvel Quale, RPH 12 mL/hr at 02/22/15 1856 1,200 Units/hr at 02/22/15 1856  . heparin bolus via infusion 4,000 Units  4,000 Units Intravenous Once [employer], [facility]      . morphine 2 MG/ML injection 1 mg  1 mg Intravenous Q4H PRN Tasrif Ahmed, MD      . ondansetron (ZOFRAN) tablet 4 mg  4 mg Oral Q6H PRN Tasrif Ahmed, MD       Or  . ondansetron (ZOFRAN) injection 4 mg  4 mg Intravenous Q6H PRN Tasrif Ahmed, MD      . promethazine (PHENERGAN) injection 12.5 mg  12.5 mg Intravenous Once Leisa Tapia, PA-C      . sodium chloride 0.9 % injection 3 mL  3 mL Intravenous Q12H Tasrif Ahmed, MD        Allergies: Allergies as of 02/22/2015 - Review Complete 02/22/2015  Allergen Reaction Noted  . Other  02/22/2015  . Sulfur  02/22/2015   History reviewed. No pertinent past medical history. Past Surgical History  Procedure Laterality Date  . Abdominal hysterectomy    .  Appendectomy     History reviewed. No pertinent family history. Social History   Social History  . Marital Status: Married    Spouse Name: N/A  . Number of Children: N/A  . Years of Education: N/A   Occupational History  . Not on file.   Social History Main Topics  . Smoking status: Never Smoker   . Smokeless tobacco: Not on file  . Alcohol Use: Yes     Comment: little  . Drug Use: No  . Sexual Activity: Not on file   Other Topics Concern  . Not on file   Social History Narrative  . No narrative on file    Review of Systems: Review of Systems  Constitutional: Positive for diaphoresis. Negative for fever and chills.    Eyes: Negative for blurred vision and double vision.  Respiratory: Negative for shortness of breath.   Cardiovascular: Negative for chest pain and leg swelling.  Gastrointestinal: Positive for nausea and vomiting.  Genitourinary:       Retention  Musculoskeletal: Positive for joint pain.  Skin: Negative for itching.  Neurological: Negative for dizziness, loss of consciousness and headaches.  Endo/Heme/Allergies: Does not bruise/bleed easily.   Physical Exam: Blood pressure 140/94, pulse 102, temperature 97.7 F (36.5 C), temperature source Oral, resp. rate 18, height 5' 6.5" (1.689 m), weight 117.935 kg (260 lb), SpO2 94 %. GENERAL- alert, co-operative, obese, NAD HEENT- pupils constricted bilaterally, oral mucosa appears moist, good and intact dentition CARDIAC- RRR, no murmurs, rubs or gallops. RESP- CTAB, no wheezes or crackles. ABDOMEN- Soft, right upper quadrant tenderness to palpation without rebound, normoactive bowel sounds present throughout BACK- Normal curvature, no paraspinal tenderness, no CVA tenderness. EXTREMITIES- pulse 2+, symmetric, no pedal edema. SKIN- Warm, dry, No rash or lesion. PSYCH- euphoric mood and affect, tangential thought content and speech  Lab results: Basic Metabolic Panel:  Recent Labs  02/22/15 1215  NA 138  K 3.7  CL 106  CO2 25  GLUCOSE 149*  BUN 8  CREATININE 0.84  CALCIUM 9.3   Liver Function Tests:  Recent Labs  02/22/15 1215  AST 30  ALT 25  ALKPHOS 58  BILITOT 0.7  PROT 8.5*  ALBUMIN 4.2    Recent Labs  02/22/15 1215  LIPASE 26   No results for input(s): AMMONIA in the last 72 hours. CBC:  Recent Labs  02/22/15 1215  WBC 8.8  HGB 14.3  HCT 42.2  MCV 90.9  PLT 230   Cardiac Enzymes: No results for input(s): CKTOTAL, CKMB, CKMBINDEX, TROPONINI in the last 72 hours. BNP: No results for input(s): PROBNP in the last 72 hours. D-Dimer: No results for input(s): DDIMER in the last 72 hours. CBG: No  results for input(s): GLUCAP in the last 72 hours. Hemoglobin A1C: No results for input(s): HGBA1C in the last 72 hours. Fasting Lipid Panel: No results for input(s): CHOL, HDL, LDLCALC, TRIG, CHOLHDL, LDLDIRECT in the last 72 hours. Thyroid Function Tests: No results for input(s): TSH, T4TOTAL, FREET4, T3FREE, THYROIDAB in the last 72 hours. Anemia Panel: No results for input(s): VITAMINB12, FOLATE, FERRITIN, TIBC, IRON, RETICCTPCT in the last 72 hours. Coagulation: No results for input(s): LABPROT, INR in the last 72 hours. Urine Drug Screen: Drugs of Abuse  No results found for: LABOPIA, COCAINSCRNUR, LABBENZ, AMPHETMU, THCU, LABBARB  Alcohol Level: No results for input(s): ETH in the last 72 hours. Urinalysis:  Recent Labs  02/22/15 1159  COLORURINE YELLOW  LABSPEC 1.018  PHURINE 6.5  GLUCOSEU NEGATIVE  HGBUR NEGATIVE  BILIRUBINUR NEGATIVE  KETONESUR 15*  PROTEINUR NEGATIVE  UROBILINOGEN 0.2  NITRITE NEGATIVE  LEUKOCYTESUR NEGATIVE    Imaging results:  Dg Abd 1 View  02/22/2015   CLINICAL DATA:  Abdominal pain. Pt c/o generalized abdominal pain, nausea, of vomiting of yellow substance x today. Hx appendectomy, hysterectomy and liver biopsy.  EXAM: ABDOMEN - 1 VIEW  COMPARISON:  CT 10/20/2010  FINDINGS: Small bowel decompressed. Moderate proximal colonic fecal material, decompressed distally. Surgical clips in the left pelvis.  No abnormal abdominal calcifications.  Mild degenerative changes in the lower lumbar spine.  IMPRESSION: 1. Nonobstructive bowel gas pattern.   Electronically Signed   By: Lucrezia Europe M.D.   On: 02/22/2015 15:08   US Abdomen Complete  02/22/2015   CLINICAL DATA:  Pain right upper quadrant radiating to left upper quadrant right flank since 3 a.m. with nausea and vomiting  EXAM: ULTRASOUND ABDOMEN COMPLETE  COMPARISON:  None.  FINDINGS: Gallbladder: 18 mm fell lodged in the neck of the gallbladder. Gallbladder wall is not thickened and there is no  Murphy's sign.  Common bile duct: Diameter: 5 mm  Liver: Fatty infiltration  IVC: No abnormality visualized.  Pancreas: Visualized portion unremarkable.  Spleen: Size and appearance within normal limits.  Right Kidney: Length: 13.6 cm. Echogenicity within normal limits. No mass or hydronephrosis visualized.  Left Kidney: Length: 13.3 cm. Echogenicity within normal limits. No mass or hydronephrosis visualized.  Abdominal aorta: No aneurysm visualized.  Other findings: None.  IMPRESSION: Cholelithiasis with nonmobile stone lodged in the neck of the gallbladder.   Electronically Signed   By: Skipper Cliche M.D.   On: 02/22/2015 15:15    Other results: EKG: A. fib with RVR heart rate 90-100  Assessment & Plan by Problem: Symptomatic cholelithiasis 18 mm obstructive gallstone identified on right upper quadrant ultrasound. General surgery consult for evaluation of patient. Intervention on current A. fib will need to be planned accordingly as is his stone is high risk for cholecystitis if not resolved. - Morphine 1-2 mg IV every 2 hours when necessary - Zofran 4 mg every 6 hours when necessary - F/U AM CBC, CMP  A. fib with RVR New onset atrial fibrillation and patient not on chronic anticoagulation. Likely secondary to acute pain from obstructive gallstone. No other contributory etiologies at this time. Cardiology consulted for management recs. Patient hemodynamically stable and admitted on drips. - Unfractionated Heparin infusion - Cardizem drip 5-15 mg/hr - TSH - F/U AM EKG - Cardiac monitoring  Diet: NPO DVT ppx: On heparin drip FULL CODE  Dispo: Disposition is deferred at this time, awaiting improvement of current medical problems.   The patient does not have a current PCP (No primary care provider on file.) and does not know need an [facility] hospital follow-up appointment after discharge.  The patient does not know have transportation limitations that hinder transportation to clinic  appointments.  Signed: Collier Salina, MD 02/22/2015, 7:16 PM

## 2015-02-22 NOTE — Progress Notes (Addendum)
ANTICOAGULATION CONSULT NOTE - Initial Consult  Pharmacy Consult for heparin Indication: atrial fibrillation  Allergies  Allergen Reactions  . Other     Metals  . Sulfur     Patient Measurements: Height: 5' 6.5" (168.9 cm) Weight: 260 lb (117.935 kg) IBW/kg (Calculated) : 60.45 Heparin Dosing Weight: 88.2 kg  Vital Signs: Temp: 97.7 F (36.5 C) (09/17 1140) Temp Source: Oral (09/17 1140) BP: 134/87 mmHg (09/17 1715) Pulse Rate: 102 (09/17 1715)  Labs:  Recent Labs  02/22/15 1215  HGB 14.3  HCT 42.2  PLT 230  CREATININE 0.84    Estimated Creatinine Clearance: 93.9 mL/min (by C-G formula based on Cr of 0.84).   Medical History: History reviewed. No pertinent past medical history.  Medications:   (Not in a hospital admission)  Assessment: 59 yo female admitted with new onset AFib. No anticoagulation PTA. CBC stable, SCr 0.8, eCrCl 90 ml/min.  Goal of Therapy:  Heparin level 0.3-0.7 units/ml Monitor platelets by anticoagulation protocol: Yes   Plan:  -Heparin 4000 units x1 then 1200 units/hr -Daily HL, CBC -Monitor s/sx bleeding -First heparin level this evening    Hughes Better, PharmD, BCPS Clinical Pharmacist Pager: [phone] 02/22/2015 5:38 PM   ADDN: Initial HL is subtherapeutic at 0.13 on heparin 1200 units/hr. Nurse reports no issues with infusion or bleeding.  Plan: Bolus heparin 2500 units and increase rate to 1500 units/hr 6h HL  Andrey Cota. Diona Foley, PharmD Clinical Pharmacist Pager [phone]

## 2015-02-22 NOTE — ED Notes (Signed)
After 10 mg of cardizem pt hr from 154 to 91, new EKG captured.

## 2015-02-22 NOTE — ED Notes (Signed)
Pt bladder scan revealed <70 cc urine. Spoke with PA. Not doing foley catheter.

## 2015-02-23 ENCOUNTER — Inpatient Hospital Stay (HOSPITAL_COMMUNITY): Payer: Medicare Other

## 2015-02-23 DIAGNOSIS — R030 Elevated blood-pressure reading, without diagnosis of hypertension: Secondary | ICD-10-CM | POA: Diagnosis present

## 2015-02-23 DIAGNOSIS — K8021 Calculus of gallbladder without cholecystitis with obstruction: Secondary | ICD-10-CM | POA: Insufficient documentation

## 2015-02-23 DIAGNOSIS — M797 Fibromyalgia: Secondary | ICD-10-CM

## 2015-02-23 DIAGNOSIS — Z01810 Encounter for preprocedural cardiovascular examination: Secondary | ICD-10-CM

## 2015-02-23 DIAGNOSIS — R109 Unspecified abdominal pain: Secondary | ICD-10-CM | POA: Insufficient documentation

## 2015-02-23 DIAGNOSIS — I4891 Unspecified atrial fibrillation: Secondary | ICD-10-CM

## 2015-02-23 DIAGNOSIS — I481 Persistent atrial fibrillation: Secondary | ICD-10-CM

## 2015-02-23 DIAGNOSIS — K8020 Calculus of gallbladder without cholecystitis without obstruction: Secondary | ICD-10-CM | POA: Insufficient documentation

## 2015-02-23 HISTORY — DX: Elevated blood-pressure reading, without diagnosis of hypertension: R03.0

## 2015-02-23 HISTORY — DX: Unspecified atrial fibrillation: I48.91

## 2015-02-23 HISTORY — DX: Encounter for preprocedural cardiovascular examination: Z01.810

## 2015-02-23 LAB — CBC WITH DIFFERENTIAL/PLATELET
BASOS ABS: 0 10*3/uL (ref 0.0–0.1)
BASOS PCT: 0 %
EOS ABS: 0 10*3/uL (ref 0.0–0.7)
EOS PCT: 0 %
HCT: 41.7 % (ref 36.0–46.0)
Hemoglobin: 14 g/dL (ref 12.0–15.0)
Lymphocytes Relative: 15 %
Lymphs Abs: 1.8 10*3/uL (ref 0.7–4.0)
MCH: 30.8 pg (ref 26.0–34.0)
MCHC: 33.6 g/dL (ref 30.0–36.0)
MCV: 91.9 fL (ref 78.0–100.0)
MONO ABS: 1.2 10*3/uL — AB (ref 0.1–1.0)
Monocytes Relative: 10 %
Neutro Abs: 9 10*3/uL — ABNORMAL HIGH (ref 1.7–7.7)
Neutrophils Relative %: 75 %
PLATELETS: 192 10*3/uL (ref 150–400)
RBC: 4.54 MIL/uL (ref 3.87–5.11)
RDW: 12.9 % (ref 11.5–15.5)
WBC: 12 10*3/uL — AB (ref 4.0–10.5)

## 2015-02-23 LAB — COMPREHENSIVE METABOLIC PANEL
ALBUMIN: 3.5 g/dL (ref 3.5–5.0)
ALK PHOS: 57 U/L (ref 38–126)
ALT: 25 U/L (ref 14–54)
ANION GAP: 9 (ref 5–15)
AST: 32 U/L (ref 15–41)
BILIRUBIN TOTAL: 0.9 mg/dL (ref 0.3–1.2)
BUN: 6 mg/dL (ref 6–20)
CALCIUM: 8.9 mg/dL (ref 8.9–10.3)
CO2: 22 mmol/L (ref 22–32)
Chloride: 106 mmol/L (ref 101–111)
Creatinine, Ser: 0.89 mg/dL (ref 0.44–1.00)
GFR calc Af Amer: >60 mL/min (ref >60)
GLUCOSE: 143 mg/dL — AB (ref 65–99)
Potassium: 3.9 mmol/L (ref 3.5–5.1)
Sodium: 137 mmol/L (ref 135–145)
TOTAL PROTEIN: 7.5 g/dL (ref 6.5–8.1)

## 2015-02-23 LAB — HEPARIN LEVEL (UNFRACTIONATED)
Heparin Unfractionated: 0.13 IU/mL — ABNORMAL LOW (ref 0.30–0.70)
Heparin Unfractionated: 0.24 IU/mL — ABNORMAL LOW (ref 0.30–0.70)
Heparin Unfractionated: 0.3 IU/mL (ref 0.30–0.70)

## 2015-02-23 MED ORDER — DILTIAZEM HCL ER COATED BEADS 240 MG PO CP24: 240.0000 mg | ORAL_CAPSULE | Freq: Every day | ORAL | Status: DC

## 2015-02-23 MED ORDER — POTASSIUM CHLORIDE CRYS ER 20 MEQ PO TBCR
40.0000 meq | EXTENDED_RELEASE_TABLET | Freq: Two times a day (BID) | ORAL | Status: DC
  Administered 2015-02-23 – 2015-02-25 (×6): 40 meq via ORAL
  Filled 2015-02-23 (×8): qty 2

## 2015-02-23 MED ORDER — DILTIAZEM HCL 60 MG PO TABS
60.0000 mg | ORAL_TABLET | Freq: Four times a day (QID) | ORAL | Status: DC
  Administered 2015-02-23 – 2015-02-25 (×8): 60 mg via ORAL
  Filled 2015-02-23 (×8): qty 1

## 2015-02-23 MED ORDER — MAGNESIUM SULFATE IN D5W 10-5 MG/ML-% IV SOLN
1.0000 g | Freq: Once | INTRAVENOUS | Status: AC
  Administered 2015-02-23: 1 g via INTRAVENOUS
  Filled 2015-02-23: qty 100

## 2015-02-23 MED ORDER — DOCUSATE SODIUM 50 MG/5ML PO LIQD: 50.0000 mg | Freq: Every day | ORAL | Status: DC | PRN

## 2015-02-23 MED ORDER — HEPARIN BOLUS VIA INFUSION
2500.0000 [IU] | Freq: Once | INTRAVENOUS | Status: AC
  Administered 2015-02-23: 2500 [IU] via INTRAVENOUS
  Filled 2015-02-23: qty 2500

## 2015-02-23 MED ORDER — DOCUSATE SODIUM 50 MG PO CAPS
50.0000 mg | ORAL_CAPSULE | Freq: Every day | ORAL | Status: DC | PRN
  Filled 2015-02-23: qty 1

## 2015-02-23 MED ORDER — DIPHENHYDRAMINE HCL 50 MG/ML IJ SOLN
12.5000 mg | Freq: Once | INTRAMUSCULAR | Status: AC
  Administered 2015-02-23: 12.5 mg via INTRAVENOUS
  Filled 2015-02-23: qty 1

## 2015-02-23 NOTE — Progress Notes (Signed)
*  PRELIMINARY RESULTS* Echocardiogram 2D Echocardiogram has been performed.  Leavy Cella 02/23/2015, 3:55 PM

## 2015-02-23 NOTE — Progress Notes (Signed)
  Date: 02/23/2015  Patient name: CHANIYAH JAHR  Medical record number: [id number]  Date of birth: Oct 06, 1955   I have seen and evaluated [name] and discussed their care with the Residency Team.  Briefly, Ms. Tadros is a 59yo woman with PMH of gestational DM, osteoarthritis, fibromyalgia who presented to the emergency department with RUQ pain in her abdomen, nausea, vomiting and diarrhea. She notes feeling well prior to pain starting.  She had one episode of loose stool on the day prior to admission.  On the day of admission, she was awoken early by a bad smell and pain in her abdomen.  She had associated symptoms including diaphoresis, vomiting.  The pain was 10/10 and better with leaning forward.  The pain was continuous.  She denied any blood in the emesis or stool, fevers, chills, other sites of pain. She denied any chest pain.   In the ED, she was found to have an obstructing gallstone by ultrasound.  Likely due to pain, she went in to Afib with RVR.  Her pain was treated with IV morphine, dilaudid, toradol.  She was started on a diltiazem drip.   Exam:  Gen: Alert, NAD Eyes: Anicteric, EOMI HENT: NCAT, no oropharyngeal exudate CVS: Irreg Irreg, no murmur noted, pulses palpable in upper extremities Pulm: CTAB at bases, no wheezing Abd: Pain free when I saw her, low frequency of bowel sounds, obese Ext: No edema or erythema Neuro: Grossly intact, strength normal.   Pertinent data LFTs normal except for mildly elevated Protein, WBC normal, lipase normal  US Abdomen Complete  02/22/2015 CLINICAL DATA: Pain right upper quadrant radiating to left upper quadrant right flank since 3 a.m. with nausea and vomiting EXAM: ULTRASOUND ABDOMEN COMPLETE COMPARISON: None. FINDINGS: Gallbladder: 18 mm fell lodged in the neck of the gallbladder. Gallbladder wall is not thickened and there is no Murphy's sign. Common bile duct: Diameter: 5 mm Liver: Fatty infiltration IVC: No abnormality  visualized. Pancreas: Visualized portion unremarkable. Spleen: Size and appearance within normal limits. Right Kidney: Length: 13.6 cm. Echogenicity within normal limits. No mass or hydronephrosis visualized. Left Kidney: Length: 13.3 cm. Echogenicity within normal limits. No mass or hydronephrosis visualized. Abdominal aorta: No aneurysm visualized. Other findings: None. IMPRESSION: Cholelithiasis with nonmobile stone lodged in the neck of the gallbladder. Electronically Signed By: Skipper Cliche M.D. On: 02/22/2015 15:15   EKG reviewed: Afib   Assessment and Plan: I have seen and evaluated the patient as outlined above. I saw the patient with the resident team.   1. Symptomatic cholelithiasis - Gallstone seen on ultrasound - Surgery consulted, requested HIDA scan which will be ordered - Morphine PRN for pain - Zofran for nausea - Trend WBC and LFTs - Will likely need surgery given size of gallstone; possibility of passing the stone is likely small  2. New onset Afib with RVR - Cardiology consulted - Heparin infusion started given new onset - Cardizem drip, transition to PO when able - check TSH - AM EKG - Consider cardioversion  3. Fibromyalgia, pain - morphine as noted above  Sid Falcon, MD 9/18/201610:59 AM

## 2015-02-23 NOTE — Progress Notes (Signed)
Subjective: Patient doing well this morning with some improvement in right upper quadrant abdominal pain, reporting of mild at this time. No chest pain or shortness of breath. No fevers chills, does endorse some nausea without vomiting well controlled on Zofran when necessary.  Objective: Vital signs in last 24 hours: Filed Vitals:   02/23/15 0800 02/23/15 0900 02/23/15 1000 02/23/15 1308  BP: 131/108 107/66 97/49 123/97  Pulse: 81 93 80 88  Temp: 99.7 F (37.6 C)   98.6 F (37 C)  TempSrc: Axillary   Oral  Resp: 19 25 17 18   Height:      Weight:      SpO2:   98% 96%   Weight change:   Intake/Output Summary (Last 24 hours) at 02/23/15 1414 Last data filed at 02/23/15 1300  Gross per 24 hour  Intake 1774.23 ml  Output   3050 ml  Net -1275.77 ml   GENERAL- alert, co-operative, obese, NAD HEENT- pupils constricted bilaterally, oral mucosa appears moist, good and intact dentition CARDIAC- RRR, no murmurs, rubs or gallops. RESP- CTAB, no wheezes or crackles. ABDOMEN- Soft, right upper quadrant tenderness to palpation without rebound, normoactive bowel sounds present throughout BACK- Normal curvature, no paraspinal tenderness, no CVA tenderness. EXTREMITIES- pulse 2+, symmetric, no pedal edema. SKIN- Warm, dry, No rash or lesion. PSYCH- euphoric mood and affect, tangential thought content and speech   Lab Results: Basic Metabolic Panel:  Recent Labs Lab 02/22/15 1215 02/23/15 0619  NA 138 137  K 3.7 3.9  CL 106 106  CO2 25 22  GLUCOSE 149* 143*  BUN 8 6  CREATININE 0.84 0.89  CALCIUM 9.3 8.9   Liver Function Tests:  Recent Labs Lab 02/22/15 1215 02/23/15 0619  AST 30 32  ALT 25 25  ALKPHOS 58 57  BILITOT 0.7 0.9  PROT 8.5* 7.5  ALBUMIN 4.2 3.5    Recent Labs Lab 02/22/15 1215  LIPASE 26   No results for input(s): AMMONIA in the last 168 hours. CBC:  Recent Labs Lab 02/22/15 1215 02/23/15 0619  WBC 8.8 12.0*  NEUTROABS  --  9.0*  HGB 14.3  14.0  HCT 42.2 41.7  MCV 90.9 91.9  PLT 230 192   Cardiac Enzymes: No results for input(s): CKTOTAL, CKMB, CKMBINDEX, TROPONINI in the last 168 hours. BNP: No results for input(s): PROBNP in the last 168 hours. D-Dimer: No results for input(s): DDIMER in the last 168 hours. CBG: No results for input(s): GLUCAP in the last 168 hours. Hemoglobin A1C: No results for input(s): HGBA1C in the last 168 hours. Fasting Lipid Panel: No results for input(s): CHOL, HDL, LDLCALC, TRIG, CHOLHDL, LDLDIRECT in the last 168 hours. Thyroid Function Tests:  Recent Labs Lab 02/22/15 1954  TSH 1.260   Coagulation: No results for input(s): LABPROT, INR in the last 168 hours. Anemia Panel: No results for input(s): VITAMINB12, FOLATE, FERRITIN, TIBC, IRON, RETICCTPCT in the last 168 hours. Urine Drug Screen: Drugs of Abuse  No results found for: LABOPIA, COCAINSCRNUR, LABBENZ, AMPHETMU, THCU, LABBARB  Alcohol Level: No results for input(s): ETH in the last 168 hours. Urinalysis:  Recent Labs Lab 02/22/15 1159  COLORURINE YELLOW  LABSPEC 1.018  PHURINE 6.5  GLUCOSEU NEGATIVE  HGBUR NEGATIVE  BILIRUBINUR NEGATIVE  KETONESUR 15*  PROTEINUR NEGATIVE  UROBILINOGEN 0.2  NITRITE NEGATIVE  LEUKOCYTESUR NEGATIVE    Micro Results: Recent Results (from the past 240 hour(s))  MRSA PCR Screening     Status: None   Collection Time: 02/22/15  6:45 PM  Result Value Ref Range Status   MRSA by PCR NEGATIVE NEGATIVE Final    Comment:        The GeneXpert MRSA Assay (FDA approved for NASAL specimens only), is one component of a comprehensive MRSA colonization surveillance program. It is not intended to diagnose MRSA infection nor to guide or monitor treatment for MRSA infections.    Studies/Results: Dg Abd 1 View  02/22/2015   CLINICAL DATA:  Abdominal pain. Pt c/o generalized abdominal pain, nausea, of vomiting of yellow substance x today. Hx appendectomy, hysterectomy and liver biopsy.   EXAM: ABDOMEN - 1 VIEW  COMPARISON:  CT 10/20/2010  FINDINGS: Small bowel decompressed. Moderate proximal colonic fecal material, decompressed distally. Surgical clips in the left pelvis.  No abnormal abdominal calcifications.  Mild degenerative changes in the lower lumbar spine.  IMPRESSION: 1. Nonobstructive bowel gas pattern.   Electronically Signed   By: Lucrezia Europe M.D.   On: 02/22/2015 15:08   US Abdomen Complete  02/22/2015   CLINICAL DATA:  Pain right upper quadrant radiating to left upper quadrant right flank since 3 a.m. with nausea and vomiting  EXAM: ULTRASOUND ABDOMEN COMPLETE  COMPARISON:  None.  FINDINGS: Gallbladder: 18 mm fell lodged in the neck of the gallbladder. Gallbladder wall is not thickened and there is no Murphy's sign.  Common bile duct: Diameter: 5 mm  Liver: Fatty infiltration  IVC: No abnormality visualized.  Pancreas: Visualized portion unremarkable.  Spleen: Size and appearance within normal limits.  Right Kidney: Length: 13.6 cm. Echogenicity within normal limits. No mass or hydronephrosis visualized.  Left Kidney: Length: 13.3 cm. Echogenicity within normal limits. No mass or hydronephrosis visualized.  Abdominal aorta: No aneurysm visualized.  Other findings: None.  IMPRESSION: Cholelithiasis with nonmobile stone lodged in the neck of the gallbladder.   Electronically Signed   By: Skipper Cliche M.D.   On: 02/22/2015 15:15   Medications: I have reviewed the patient's current medications. Scheduled Meds: . cefTRIAXone (ROCEPHIN)  IV  1 g Intravenous Q24H  . diltiazem  60 mg Oral 4 times per day  . potassium chloride  40 mEq Oral BID  . promethazine  12.5 mg Intravenous Once  . sodium chloride  3 mL Intravenous Q12H   Continuous Infusions: . sodium chloride    . heparin 1,500 Units/hr (02/23/15 1107)   PRN Meds:.docusate sodium, ketorolac, morphine injection, ondansetron **OR** ondansetron (ZOFRAN) IV Assessment/Plan: Symptomatic cholelithiasis 18 mm stone in neck  of gallbladder on RUQ. No HIDA scan today due to patient was eating and ongoing workup. Leukocytosis mildly increased to 12.0 from 8.8 yesterday and patient is on ceftriaxone due to risk of developing acute cholecystitis. - Morphine 1-2 mg IV every 2 hours when necessary - Zofran 4 mg every 6 hours when necessary - F/U AM CBC, CMP - Nothing by mouth at midnight for possible HIDA scan versus other intervention  A. fib with RVR Remains in A. Fib, hemodynamically stable with pulse less than 100 on evaluation. Rhythm not improved despite adequate pain control. TSH normal. - Unfractionated Heparin infusion - Convert to by mouth diltiazem, starting 60 mg by mouth 4 times a day and titrate to maintain heart rate less than 120 - Cardiac monitoring  Diet: NPO at midnight DVT ppx: On heparin drip FULL CODE  Dispo: Disposition is deferred at this time, awaiting improvement of current medical problems.   The patient does not have a current PCP (No primary care provider on file.) and does  not know need an [facility] hospital follow-up appointment after discharge.  The patient does not know have transportation limitations that hinder transportation to clinic appointments.   LOS: 1 day   Collier Salina, MD 02/23/2015, 2:14 PM

## 2015-02-23 NOTE — Progress Notes (Signed)
  Subjective: RUQ pain better  Objective: Vital signs in last 24 hours: Temp:  [97.7 F (36.5 C)-99.8 F (37.7 C)] 99.8 F (37.7 C) (09/18 0455) Pulse Rate:  [53-114] 81 (09/18 0800) Resp:  [14-21] 19 (09/18 0800) BP: (93-164)/(55-108) 131/108 mmHg (09/18 0800) SpO2:  [91 %-99 %] 91 % (09/18 0455) Weight:  [117.935 kg (260 lb)-118.1 kg (260 lb 5.8 oz)] 118.1 kg (260 lb 5.8 oz) (09/17 1833) Last BM Date: 02/22/15  Intake/Output from previous day: 09/17 0701 - 09/18 0700 In: 1314.2 [I.V.:1214.2; IV Piggyback:100] Out: 1450 [Urine:1450] Intake/Output this shift:    General appearance: cooperative Resp: clear to auscultation bilaterally Cardio: irregularly irregular rhythm and A flutter on monitor GI: soft, mild RUQ tenderness  Lab Results:   Recent Labs  02/22/15 1215 02/23/15 0619  WBC 8.8 12.0*  HGB 14.3 14.0  HCT 42.2 41.7  PLT 230 192   BMET  Recent Labs  02/22/15 1215 02/23/15 0619  NA 138 137  K 3.7 3.9  CL 106 106  CO2 25 22  GLUCOSE 149* 143*  BUN 8 6  CREATININE 0.84 0.89  CALCIUM 9.3 8.9   PT/INR No results for input(s): LABPROT, INR in the last 72 hours. ABG No results for input(s): PHART, HCO3 in the last 72 hours.  Invalid input(s): PCO2, PO2  Studies/Results: Dg Abd 1 View  02/22/2015   CLINICAL DATA:  Abdominal pain. Pt c/o generalized abdominal pain, nausea, of vomiting of yellow substance x today. Hx appendectomy, hysterectomy and liver biopsy.  EXAM: ABDOMEN - 1 VIEW  COMPARISON:  CT 10/20/2010  FINDINGS: Small bowel decompressed. Moderate proximal colonic fecal material, decompressed distally. Surgical clips in the left pelvis.  No abnormal abdominal calcifications.  Mild degenerative changes in the lower lumbar spine.  IMPRESSION: 1. Nonobstructive bowel gas pattern.   Electronically Signed   By: Lucrezia Europe M.D.   On: 02/22/2015 15:08   US Abdomen Complete  02/22/2015   CLINICAL DATA:  Pain right upper quadrant radiating to left  upper quadrant right flank since 3 a.m. with nausea and vomiting  EXAM: ULTRASOUND ABDOMEN COMPLETE  COMPARISON:  None.  FINDINGS: Gallbladder: 18 mm fell lodged in the neck of the gallbladder. Gallbladder wall is not thickened and there is no Murphy's sign.  Common bile duct: Diameter: 5 mm  Liver: Fatty infiltration  IVC: No abnormality visualized.  Pancreas: Visualized portion unremarkable.  Spleen: Size and appearance within normal limits.  Right Kidney: Length: 13.6 cm. Echogenicity within normal limits. No mass or hydronephrosis visualized.  Left Kidney: Length: 13.3 cm. Echogenicity within normal limits. No mass or hydronephrosis visualized.  Abdominal aorta: No aneurysm visualized.  Other findings: None.  IMPRESSION: Cholelithiasis with nonmobile stone lodged in the neck of the gallbladder.   Electronically Signed   By: Skipper Cliche M.D.   On: 02/22/2015 15:15    Anti-infectives: Anti-infectives    Start     Dose/Rate Route Frequency Ordered Stop   02/22/15 2100  cefTRIAXone (ROCEPHIN) 1 g in dextrose 5 % 50 mL IVPB     1 g 100 mL/hr over 30 Minutes Intravenous Every 24 hours 02/22/15 2025        Assessment/Plan: Symptomatic cholelithiasis - no need for urgent surgery, consider lap chole this admit pending cardiology work-up, Rocephin Paroxysmal a fib/ a flutter - per cards  LOS: 1 day    THOMPSON,BURKE E 02/23/2015

## 2015-02-23 NOTE — Progress Notes (Signed)
Utilization Review Completed.Dowell, Deborah T9/18/2016  

## 2015-02-23 NOTE — Progress Notes (Signed)
       Patient Name: Christie Atkinson Date of Encounter: 02/23/2015    SUBJECTIVE: No specific cardiac complaints. No prior history of A. fib. Chads score is 1 and we will await echo to help further define risk of stroke.  TELEMETRY:  Atrial fibrillation with better controlled rate. Filed Vitals:   02/23/15 0600 02/23/15 0800 02/23/15 0900 02/23/15 1000  BP: 96/55 131/108 107/66 97/49  Pulse: 88 81 93 80  Temp:  99.7 F (37.6 C)    TempSrc:  Axillary    Resp: 19 19 25 17   Height:      Weight:      SpO2:    98%    Intake/Output Summary (Last 24 hours) at 02/23/15 1102 Last data filed at 02/23/15 1000  Gross per 24 hour  Intake 1774.23 ml  Output   1850 ml  Net -75.77 ml   LABS: Basic Metabolic Panel:  Recent Labs  02/22/15 1215 02/23/15 0619  NA 138 137  K 3.7 3.9  CL 106 106  CO2 25 22  GLUCOSE 149* 143*  BUN 8 6  CREATININE 0.84 0.89  CALCIUM 9.3 8.9   CBC:  Recent Labs  02/22/15 1215 02/23/15 0619  WBC 8.8 12.0*  NEUTROABS  --  9.0*  HGB 14.3 14.0  HCT 42.2 41.7  MCV 90.9 91.9  PLT 230 192   Radiology/Studies:  No cardiopulmonary data  ECG: Coarse atrial fibrillation with controlled rate at 80 weeks per minute  Physical Exam: Blood pressure 97/49, pulse 80, temperature 99.7 F (37.6 C), temperature source Axillary, resp. rate 17, height 5' 6.5" (1.689 m), weight 118.1 kg (260 lb 5.8 oz), SpO2 98 %. Weight change:   Wt Readings from Last 3 Encounters:  02/22/15 118.1 kg (260 lb 5.8 oz)    Irregularly irregular rhythm. No rub.  ASSESSMENT:  1. Atrial fibrillation of unknown duration. CHADS VASC of 1. Patient is asymptomatic with reference to atrial flutter fib.  Plan:  1. Await echo results 2. Agree with consult as outlined by Dr. Rosanna Randy last evening.  Demetrios Isaacs 02/23/2015, 11:02 AM

## 2015-02-23 NOTE — Progress Notes (Addendum)
ANTICOAGULATION CONSULT NOTE - Follow Up Consult  Pharmacy Consult for Heparin Indication: atrial fibrillation  Allergies  Allergen Reactions  . Other     Metals Polyester - including hospital gowns and sheets - allergic contact dermatitis  . Sulfur     Patient Measurements: Height: 5' 6.5" (168.9 cm) Weight: 260 lb 5.8 oz (118.1 kg) IBW/kg (Calculated) : 60.45 Heparin Dosing Weight: 88 kg  Vital Signs: Temp: 98.6 F (37 C) (09/18 1308) Temp Source: Oral (09/18 1308) BP: 123/97 mmHg (09/18 1308) Pulse Rate: 88 (09/18 1308)  Labs:  Recent Labs  02/22/15 1215 02/23/15 0030 02/23/15 0619 02/23/15 1203  HGB 14.3  --  14.0  --   HCT 42.2  --  41.7  --   PLT 230  --  192  --   HEPARINUNFRC  --  0.13*  --  0.24*  CREATININE 0.84  --  0.89  --     Estimated Creatinine Clearance: 88.6 mL/min (by C-G formula based on Cr of 0.89).   Medications:  Infusions:  . sodium chloride    . heparin 1,500 Units/hr (02/23/15 1107)    Assessment: 59 year old female with new onset atrial fibrillation who is receiving anticoagulation with Heparin.  Her heparin level remains subtherapeutic after rate adjustment this morning. Hgb is stable, Platelets are down slightly, no bleeding noted  Goal of Therapy:  Heparin level 0.3-0.7 units/ml Monitor platelets by anticoagulation protocol: Yes   Plan:  Increase Heparin to 1700 units/hr 6hr heparin level  Legrand Como, Pharm.D., BCPS, AAHIVP Clinical Pharmacist Phone: [phone] or [phone] 02/23/2015, 1:51 PM   Addendum  -HL therapeutic on low end, continue 1700 units/hr for now -Monitor s/sx bleeding -F/u plan for longterm anticoagulation -Next level with AM labs   Harvel Quale  02/23/2015 8:42 PM

## 2015-02-24 ENCOUNTER — Encounter (HOSPITAL_COMMUNITY): Payer: Self-pay

## 2015-02-24 ENCOUNTER — Inpatient Hospital Stay (HOSPITAL_COMMUNITY): Payer: Medicare Other | Admitting: Anesthesiology

## 2015-02-24 ENCOUNTER — Encounter (HOSPITAL_COMMUNITY)
Admission: EM | Disposition: A | Discharge status: Home / Self Care | Payer: Self-pay | Source: Home / Self Care | Attending: Internal Medicine

## 2015-02-24 HISTORY — PX: CHOLECYSTECTOMY: SHX55

## 2015-02-24 LAB — CBC
HEMATOCRIT: 43.2 % (ref 36.0–46.0)
Hemoglobin: 14.8 g/dL (ref 12.0–15.0)
MCH: 31.9 pg (ref 26.0–34.0)
MCHC: 34.3 g/dL (ref 30.0–36.0)
MCV: 93.1 fL (ref 78.0–100.0)
Platelets: 216 10*3/uL (ref 150–400)
RBC: 4.64 MIL/uL (ref 3.87–5.11)
RDW: 13 % (ref 11.5–15.5)
WBC: 9.3 10*3/uL (ref 4.0–10.5)

## 2015-02-24 LAB — HEPARIN LEVEL (UNFRACTIONATED): Heparin Unfractionated: 0.35 IU/mL (ref 0.30–0.70)

## 2015-02-24 SURGERY — LAPAROSCOPIC CHOLECYSTECTOMY
Anesthesia: General | Site: Abdomen

## 2015-02-24 MED ORDER — EPHEDRINE SULFATE 50 MG/ML IJ SOLN
INTRAMUSCULAR | Status: DC | PRN
  Administered 2015-02-24 (×4): 5 mg via INTRAVENOUS

## 2015-02-24 MED ORDER — ROCURONIUM BROMIDE 100 MG/10ML IV SOLN
INTRAVENOUS | Status: DC | PRN
  Administered 2015-02-24: 50 mg via INTRAVENOUS

## 2015-02-24 MED ORDER — 0.9 % SODIUM CHLORIDE (POUR BTL) OPTIME
TOPICAL | Status: DC | PRN
  Administered 2015-02-24: 1000 mL

## 2015-02-24 MED ORDER — DEXTROSE 5 % IV SOLN
2.0000 g | Freq: Once | INTRAVENOUS | Status: AC
  Administered 2015-02-25: 2 g via INTRAVENOUS
  Filled 2015-02-24 (×2): qty 2

## 2015-02-24 MED ORDER — MEPERIDINE HCL 25 MG/ML IJ SOLN: 6.2500 mg | INTRAMUSCULAR | Status: DC | PRN

## 2015-02-24 MED ORDER — PNEUMOCOCCAL VAC POLYVALENT 25 MCG/0.5ML IJ INJ
0.5000 mL | INJECTION | INTRAMUSCULAR | Status: AC
  Administered 2015-02-25: 0.5 mL via INTRAMUSCULAR
  Filled 2015-02-24: qty 0.5

## 2015-02-24 MED ORDER — SUGAMMADEX SODIUM 200 MG/2ML IV SOLN
INTRAVENOUS | Status: DC | PRN
  Administered 2015-02-24: 236.2 mg via INTRAVENOUS

## 2015-02-24 MED ORDER — PHENYLEPHRINE HCL 10 MG/ML IJ SOLN
10.0000 mg | INTRAVENOUS | Status: DC | PRN
  Administered 2015-02-24: 20 ug/min via INTRAVENOUS

## 2015-02-24 MED ORDER — BUPIVACAINE-EPINEPHRINE (PF) 0.25% -1:200000 IJ SOLN
INTRAMUSCULAR | Status: AC
  Filled 2015-02-24: qty 30

## 2015-02-24 MED ORDER — LIDOCAINE HCL (CARDIAC) 20 MG/ML IV SOLN
INTRAVENOUS | Status: AC
  Filled 2015-02-24: qty 5

## 2015-02-24 MED ORDER — SUGAMMADEX SODIUM 200 MG/2ML IV SOLN
INTRAVENOUS | Status: AC
  Filled 2015-02-24: qty 2

## 2015-02-24 MED ORDER — FENTANYL CITRATE (PF) 100 MCG/2ML IJ SOLN
INTRAMUSCULAR | Status: DC | PRN
  Administered 2015-02-24: 100 ug via INTRAVENOUS
  Administered 2015-02-24: 50 ug via INTRAVENOUS
  Administered 2015-02-24: 100 ug via INTRAVENOUS

## 2015-02-24 MED ORDER — MORPHINE SULFATE (PF) 2 MG/ML IV SOLN
1.0000 mg | INTRAVENOUS | Status: DC | PRN
  Administered 2015-02-24: 2 mg via INTRAVENOUS
  Filled 2015-02-24: qty 1

## 2015-02-24 MED ORDER — MIDAZOLAM HCL 5 MG/5ML IJ SOLN
INTRAMUSCULAR | Status: DC | PRN
  Administered 2015-02-24: 2 mg via INTRAVENOUS

## 2015-02-24 MED ORDER — HYDROMORPHONE HCL 1 MG/ML IJ SOLN: 0.2500 mg | INTRAMUSCULAR | Status: DC | PRN

## 2015-02-24 MED ORDER — ROCURONIUM BROMIDE 50 MG/5ML IV SOLN
INTRAVENOUS | Status: AC
  Filled 2015-02-24: qty 1

## 2015-02-24 MED ORDER — PROPRANOLOL HCL 1 MG/ML IV SOLN
INTRAVENOUS | Status: DC | PRN
  Administered 2015-02-24: .25 mg via INTRAVENOUS

## 2015-02-24 MED ORDER — DEXTROSE 5 % IV SOLN
2.0000 g | INTRAVENOUS | Status: DC
  Administered 2015-02-24: 2 g via INTRAVENOUS
  Filled 2015-02-24: qty 2

## 2015-02-24 MED ORDER — OXYCODONE-ACETAMINOPHEN 5-325 MG PO TABS
1.0000 | ORAL_TABLET | ORAL | Status: DC | PRN
  Administered 2015-02-24: 2 via ORAL
  Administered 2015-02-25: 1 via ORAL
  Administered 2015-02-25 – 2015-02-26 (×5): 2 via ORAL
  Filled 2015-02-24 (×8): qty 2

## 2015-02-24 MED ORDER — PROMETHAZINE HCL 25 MG/ML IJ SOLN: 6.2500 mg | INTRAMUSCULAR | Status: DC | PRN

## 2015-02-24 MED ORDER — ENOXAPARIN SODIUM 40 MG/0.4ML ~~LOC~~ SOLN
40.0000 mg | SUBCUTANEOUS | Status: DC
  Filled 2015-02-24: qty 0.4

## 2015-02-24 MED ORDER — LACTATED RINGERS IV SOLN
INTRAVENOUS | Status: DC | PRN
  Administered 2015-02-24 (×2): via INTRAVENOUS

## 2015-02-24 MED ORDER — SODIUM CHLORIDE 0.9 % IR SOLN
Status: DC | PRN
  Administered 2015-02-24: 1000 mL

## 2015-02-24 MED ORDER — LIDOCAINE HCL (CARDIAC) 20 MG/ML IV SOLN
INTRAVENOUS | Status: DC | PRN
  Administered 2015-02-24: 80 mg via INTRAVENOUS

## 2015-02-24 MED ORDER — MIDAZOLAM HCL 2 MG/2ML IJ SOLN
INTRAMUSCULAR | Status: AC
Start: 2015-02-24 — End: 2015-02-24
  Filled 2015-02-24: qty 4

## 2015-02-24 MED ORDER — MIDAZOLAM HCL 2 MG/2ML IJ SOLN: 0.5000 mg | Freq: Once | INTRAMUSCULAR | Status: DC | PRN

## 2015-02-24 MED ORDER — FENTANYL CITRATE (PF) 250 MCG/5ML IJ SOLN
INTRAMUSCULAR | Status: AC
  Filled 2015-02-24: qty 5

## 2015-02-24 MED ORDER — PROPOFOL 10 MG/ML IV BOLUS
INTRAVENOUS | Status: AC
  Filled 2015-02-24: qty 20

## 2015-02-24 MED ORDER — IOHEXOL 300 MG/ML  SOLN
INTRAMUSCULAR | Status: DC | PRN
  Administered 2015-02-24: 50 mL

## 2015-02-24 MED ORDER — PROPOFOL 10 MG/ML IV BOLUS
INTRAVENOUS | Status: DC | PRN
  Administered 2015-02-24: 160 mg via INTRAVENOUS

## 2015-02-24 MED ORDER — ONDANSETRON HCL 4 MG/2ML IJ SOLN
INTRAMUSCULAR | Status: DC | PRN
  Administered 2015-02-24: 4 mg via INTRAVENOUS

## 2015-02-24 MED ORDER — BUPIVACAINE-EPINEPHRINE 0.25% -1:200000 IJ SOLN
INTRAMUSCULAR | Status: DC | PRN
  Administered 2015-02-24: 18 mL

## 2015-02-24 SURGICAL SUPPLY — 52 items
APPLIER CLIP 5 13 M/L LIGAMAX5 (MISCELLANEOUS) ×3
BANDAGE ADH SHEER 1  50/CT (GAUZE/BANDAGES/DRESSINGS) ×9 IMPLANT
BENZOIN TINCTURE PRP APPL 2/3 (GAUZE/BANDAGES/DRESSINGS) ×3 IMPLANT
BLADE SURG ROTATE 9660 (MISCELLANEOUS) IMPLANT
CANISTER SUCTION 2500CC (MISCELLANEOUS) ×3 IMPLANT
CHLORAPREP W/TINT 26ML (MISCELLANEOUS) ×3 IMPLANT
CLIP APPLIE 5 13 M/L LIGAMAX5 (MISCELLANEOUS) ×2 IMPLANT
COVER MAYO STAND STRL (DRAPES) ×3 IMPLANT
COVER SURGICAL LIGHT HANDLE (MISCELLANEOUS) ×3 IMPLANT
DEVICE TROCAR PUNCTURE CLOSURE (ENDOMECHANICALS) ×3 IMPLANT
DRAPE C-ARM 42X72 X-RAY (DRAPES) ×3 IMPLANT
DRAPE LAPAROSCOPIC ABDOMINAL (DRAPES) ×3 IMPLANT
DRSG TEGADERM 4X4.75 (GAUZE/BANDAGES/DRESSINGS) ×3 IMPLANT
ELECT REM PT RETURN 9FT ADLT (ELECTROSURGICAL) ×3
ELECTRODE REM PT RTRN 9FT ADLT (ELECTROSURGICAL) ×2 IMPLANT
GAUZE SPONGE 2X2 8PLY STRL LF (GAUZE/BANDAGES/DRESSINGS) ×2 IMPLANT
GLOVE BIO SURGEON STRL SZ 6.5 (GLOVE) ×6 IMPLANT
GLOVE BIOGEL M STRL SZ7.5 (GLOVE) ×3 IMPLANT
GLOVE BIOGEL PI IND STRL 6.5 (GLOVE) ×2 IMPLANT
GLOVE BIOGEL PI IND STRL 7.0 (GLOVE) ×2 IMPLANT
GLOVE BIOGEL PI IND STRL 8 (GLOVE) ×4 IMPLANT
GLOVE BIOGEL PI INDICATOR 6.5 (GLOVE) ×1
GLOVE BIOGEL PI INDICATOR 7.0 (GLOVE) ×1
GLOVE BIOGEL PI INDICATOR 8 (GLOVE) ×2
GLOVE ECLIPSE 7.0 STRL STRAW (GLOVE) ×3 IMPLANT
GLOVE ORTHOPEDIC STR SZ6.5 (GLOVE) ×3 IMPLANT
GOWN STRL REUS W/ TWL LRG LVL3 (GOWN DISPOSABLE) ×6 IMPLANT
GOWN STRL REUS W/ TWL XL LVL3 (GOWN DISPOSABLE) ×2 IMPLANT
GOWN STRL REUS W/TWL LRG LVL3 (GOWN DISPOSABLE) ×3
GOWN STRL REUS W/TWL XL LVL3 (GOWN DISPOSABLE) ×1
KIT BASIN OR (CUSTOM PROCEDURE TRAY) ×3 IMPLANT
KIT ROOM TURNOVER OR (KITS) ×3 IMPLANT
NS IRRIG 1000ML POUR BTL (IV SOLUTION) ×3 IMPLANT
PAD ARMBOARD 7.5X6 YLW CONV (MISCELLANEOUS) ×3 IMPLANT
PENCIL BUTTON HOLSTER BLD 10FT (ELECTRODE) ×3 IMPLANT
POUCH RETRIEVAL ECOSAC 10 (ENDOMECHANICALS) ×2 IMPLANT
POUCH RETRIEVAL ECOSAC 10MM (ENDOMECHANICALS) ×1
SCISSORS LAP 5X35 DISP (ENDOMECHANICALS) ×3 IMPLANT
SET CHOLANGIOGRAPH 5 50 .035 (SET/KITS/TRAYS/PACK) ×3 IMPLANT
SET IRRIG TUBING LAPAROSCOPIC (IRRIGATION / IRRIGATOR) ×3 IMPLANT
SLEEVE ENDOPATH XCEL 5M (ENDOMECHANICALS) ×6 IMPLANT
SPECIMEN JAR SMALL (MISCELLANEOUS) ×3 IMPLANT
SPONGE GAUZE 2X2 STER 10/PKG (GAUZE/BANDAGES/DRESSINGS) ×1
STRIP CLOSURE SKIN 1/2X4 (GAUZE/BANDAGES/DRESSINGS) ×3 IMPLANT
SUT MNCRL AB 4-0 PS2 18 (SUTURE) ×3 IMPLANT
SUT VICRYL 0 UR6 27IN ABS (SUTURE) ×3 IMPLANT
TOWEL OR 17X24 6PK STRL BLUE (TOWEL DISPOSABLE) ×3 IMPLANT
TOWEL OR 17X26 10 PK STRL BLUE (TOWEL DISPOSABLE) ×3 IMPLANT
TRAY LAPAROSCOPIC MC (CUSTOM PROCEDURE TRAY) ×3 IMPLANT
TROCAR XCEL BLUNT TIP 100MML (ENDOMECHANICALS) ×3 IMPLANT
TROCAR XCEL NON-BLD 5MMX100MML (ENDOMECHANICALS) ×3 IMPLANT
TUBING INSUFFLATION (TUBING) ×3 IMPLANT

## 2015-02-24 NOTE — Op Note (Signed)
Christie Atkinson [id number] Jun 05, 1956 02/24/2015  Laparoscopic Cholecystectomy Procedure Note  Indications: This patient presents with symptomatic gallbladder disease and will undergo laparoscopic cholecystectomy. Please see chart for additional details  Pre-operative Diagnosis: Acute cholecystitis  Post-operative Diagnosis: Same  Surgeon: Gayland Curry   Assistants: Jocelyn Lamer, PA-C  Anesthesia: General endotracheal anesthesia  ASA Class: 3  Procedure Details  The patient was seen again in the Holding Room. The risks, benefits, complications, treatment options, and expected outcomes were discussed with the patient. The possibilities of reaction to medication, pulmonary aspiration, perforation of viscus, bleeding, recurrent infection, finding a normal gallbladder, the need for additional procedures, failure to diagnose a condition, the possible need to convert to an open procedure, and creating a complication requiring transfusion or operation were discussed with the patient. The likelihood of improving the patient's symptoms with return to their baseline status is good.  The patient and/or family concurred with the proposed plan, giving informed consent. The site of surgery properly noted. The patient was taken to Operating Room, identified as Christie Atkinson and the procedure verified as Laparoscopic Cholecystectomy with possible Intraoperative Cholangiogram. A Time Out was held and the above information confirmed. Antibiotic prophylaxis was administered.   Prior to the induction of general anesthesia, antibiotic prophylaxis was administered. General endotracheal anesthesia was then administered and tolerated well. After the induction, the abdomen was prepped with Chloraprep and draped in the sterile fashion. The patient was positioned in the supine position.  Local anesthetic agent was injected into the skin near the umbilicus and an incision made. We dissected down to the abdominal  fascia with blunt dissection.  The fascia was incised vertically and we entered the peritoneal cavity bluntly.  A pursestring suture of 0-Vicryl was placed around the fascial opening.  The Hasson cannula was inserted and secured with the stay suture.  Pneumoperitoneum was then created with CO2 and tolerated well without any adverse changes in the patient's vital signs. An 5-mm port was placed in the subxiphoid position.  Two 5-mm ports were placed in the right upper quadrant. All skin incisions were infiltrated with a local anesthetic agent before making the incision and placing the trocars.   We positioned the patient in reverse Trendelenburg, tilted slightly to the patient's left.  The gallbladder was very thickened and inflammed and edematous. I had to aspirate gallbladder in order to grasp it. The gallbladder was identified, the fundus grasped and retracted cephalad. Adhesions were lysed bluntly and with the electrocautery where indicated, taking care not to injure any adjacent organs or viscus. The infundibulum was grasped and retracted laterally, exposing the peritoneum overlying the triangle of Calot. This was then divided and exposed in a blunt fashion. There was inflammed peritoneum which was stripped off bluntly and with a Clinical research associate.  A critical view of the cystic duct and cystic artery was obtained.  The cystic duct was clearly identified and bluntly dissected circumferentially. It was shortened but clearly going into the gallbladder. Because it was short, i elected not to do a cholangiogram.  The cystic duct was then ligated with clips and divided. The cystic artery which had been identified & dissected free was ligated with clips and divided as well.   The gallbladder was dissected from the liver bed in retrograde fashion with the electrocautery. The gallbladder was removed and placed in an Ecco sac.  The gallbladder and Ecco sac were then removed through the umbilical port site. The liver  bed was irrigated and inspected. Hemostasis was  achieved with the electrocautery. Copious irrigation was utilized and was repeatedly aspirated until clear.  The pursestring suture was used to close the umbilical fascia.  An additional interrupted 0 vicryl suture was placed at the umbilical fascia.   We again inspected the right upper quadrant for hemostasis.  The umbilical closure was inspected and there was no air leak and nothing trapped within the closure. Pneumoperitoneum was released as we removed the trocars.  4-0 Monocryl was used to close the skin.   Benzoin, steri-strips, and clean dressings were applied. The patient was then extubated and brought to the recovery room in stable condition. Instrument, sponge, and needle counts were correct at closure and at the conclusion of the case.   Findings: Cholecystitis with Cholelithiasis  Estimated Blood Loss: Minimal         Drains: none         Specimens: Gallbladder           Complications: None; patient tolerated the procedure well.         Disposition: PACU - hemodynamically stable.         Condition: stable  Christie Atkinson. Redmond Pulling, MD, FACS General, Bariatric, & Minimally Invasive Surgery [facility], [place]

## 2015-02-24 NOTE — Transfer of Care (Signed)
Immediate Anesthesia Transfer of Care Note  Patient: Christie Atkinson  Procedure(s) Performed: Procedure(s): LAPAROSCOPIC CHOLECYSTECTOMY (N/A)  Patient Location: PACU  Anesthesia Type:General  Level of Consciousness: awake, sedated and patient cooperative  Airway & Oxygen Therapy: Patient Spontanous Breathing and Patient connected to face mask oxygen  Post-op Assessment: Report given to RN, Post -op Vital signs reviewed and stable and Patient moving all extremities  Post vital signs: Reviewed and stable  Last Vitals:  Filed Vitals:   02/24/15 1221  BP: 116/69  Pulse:   Temp:   Resp:     Complications: No apparent anesthesia complications

## 2015-02-24 NOTE — Progress Notes (Signed)
     SUBJECTIVE: Only c/o abdominal pain. No SOB or chest pain.   BP 131/74 mmHg  Pulse 75  Temp(Src) 98.4 F (36.9 C) (Oral)  Resp 16  Ht 5' 6.5" (1.689 m)  Wt 260 lb 5.8 oz (118.1 kg)  BMI 41.40 kg/m2  SpO2 96%  Intake/Output Summary (Last 24 hours) at 02/24/15 0915 Last data filed at 02/24/15 0405  Gross per 24 hour  Intake 1139.01 ml  Output   2500 ml  Net -1360.99 ml    PHYSICAL EXAM General: Well developed, well nourished, in no acute distress. Alert and oriented x 3.  Psych:  Good affect, responds appropriately Neck: No JVD. No masses noted.  Lungs: Clear bilaterally with no wheezes or rhonci noted.  Heart: Irregular irregular with no murmurs noted. Abdomen: Bowel sounds are present. Soft, non-tender.  Extremities: No lower extremity edema.   LABS: Basic Metabolic Panel:  Recent Labs  02/22/15 1215 02/23/15 0619  NA 138 137  K 3.7 3.9  CL 106 106  CO2 25 22  GLUCOSE 149* 143*  BUN 8 6  CREATININE 0.84 0.89  CALCIUM 9.3 8.9   CBC:  Recent Labs  02/23/15 0619 02/24/15 0310  WBC 12.0* 9.3  NEUTROABS 9.0*  --   HGB 14.0 14.8  HCT 41.7 43.2  MCV 91.9 93.1  PLT 192 216   Current Meds: . cefTRIAXone (ROCEPHIN)  IV  2 g Intravenous Q24H  . diltiazem  60 mg Oral 4 times per day  . potassium chloride  40 mEq Oral BID  . promethazine  12.5 mg Intravenous Once  . sodium chloride  3 mL Intravenous Q12H   Echo 02/23/15: Left ventricle: The cavity size was normal. Wall thickness was increased in a pattern of mild LVH. Systolic function was normal. The estimated ejection fraction was in the range of 55% to 60%. Wall motion was normal; there were no regional wall motion abnormalities. - Left atrium: The atrium was moderately to severely dilated. Impressions: - Normal LV systolic function; mild LVH; moderate to severe LAE; trace MR and TR.  ASSESSMENT AND PLAN: 59 yo female admitted with acute cholecystitis and found to have atrial  fibrillation with RVR.   1. Atrial fibrillation: Uncertain duration. CHADS VASC score of 1. Rate controlled on oral Cardizem. She has been on a heparin drip. Echo with normal LV function. Moderate left atrial enlargement. NO significant valvular disease. Based on current data, the only feature of her history that would increase her risk of stroke would be her gender (CHADS VASC score 1 due to gender). ASA would be appropriate at this time although she could opt for anti-coagulation also. Can stop heparin today and proceed with the planned surgical procedure.  In the post-operative period, would use ASA alone. If plan is for rate control only and she does not convert to sinus, would d/c later this week with Cardizem and ASA. I will follow with you.   MCALHANY,CHRISTOPHER  9/19/20169:15 AM

## 2015-02-24 NOTE — Anesthesia Procedure Notes (Signed)
Procedure Name: Intubation Date/Time: 02/24/2015 1:28 PM Performed by: Izora Gala Pre-anesthesia Checklist: Patient identified, Emergency Drugs available, Suction available and Patient being monitored Patient Re-evaluated:Patient Re-evaluated prior to inductionOxygen Delivery Method: Circle system utilized Preoxygenation: Pre-oxygenation with 100% oxygen Intubation Type: IV induction Ventilation: Mask ventilation without difficulty Laryngoscope Size: Miller and 3 Grade View: Grade II Tube type: Oral Tube size: 7.0 mm Airway Equipment and Method: Stylet Placement Confirmation: ETT inserted through vocal cords under direct vision,  positive ETCO2 and breath sounds checked- equal and bilateral Secured at: 22 cm Tube secured with: Tape Dental Injury: Teeth and Oropharynx as per pre-operative assessment

## 2015-02-24 NOTE — Anesthesia Preprocedure Evaluation (Addendum)
Anesthesia Evaluation  Patient identified by MRN, date of birth, ID band Patient awake    Reviewed: Allergy & Precautions, NPO status , Patient's Chart, lab work & pertinent test results  History of Anesthesia Complications Negative for: history of anesthetic complications  Airway Mallampati: I  TM Distance: >3 FB Neck ROM: Full    Dental  (+) Dental Advisory Given   Pulmonary neg pulmonary ROS,    breath sounds clear to auscultation       Cardiovascular (-) angina+ dysrhythmias (new onset ) Atrial Fibrillation  Rhythm:Irregular Rate:Normal  02/23/15 ECHO: EF 55-60%, valves OK   Neuro/Psych negative neurological ROS     GI/Hepatic Neg liver ROS, N/v with acute chole   Endo/Other  Morbid obesity  Renal/GU negative Renal ROS     Musculoskeletal   Abdominal (+) + obese,   Peds  Hematology negative hematology ROS (+)   Anesthesia Other Findings   Reproductive/Obstetrics                          Anesthesia Physical Anesthesia Plan  ASA: III  Anesthesia Plan: General   Post-op Pain Management:    Induction: Intravenous  Airway Management Planned: Oral ETT  Additional Equipment:   Intra-op Plan:   Post-operative Plan: Extubation in OR  Informed Consent: I have reviewed the patients History and Physical, chart, labs and discussed the procedure including the risks, benefits and alternatives for the proposed anesthesia with the patient or authorized representative who has indicated his/her understanding and acceptance.   Dental advisory given  Plan Discussed with: CRNA and Surgeon  Anesthesia Plan Comments: (Plan routine monitors, GETA)        Anesthesia Quick Evaluation

## 2015-02-24 NOTE — Progress Notes (Addendum)
Subjective: Feeling bloated and little more tender today.   Objective: Vital signs in last 24 hours: Temp:  [97.9 F (36.6 C)-99 F (37.2 C)] 98.4 F (36.9 C) (09/19 0743) Pulse Rate:  [67-111] 111 (09/19 0728) Resp:  [11-25] 16 (09/19 0728) BP: (96-145)/(27-97) 131/74 mmHg (09/19 0728) SpO2:  [94 %-98 %] 96 % (09/19 0728) Last BM Date: 02/22/15  Intake/Output from previous day: 09/18 0701 - 09/19 0700 In: 1539 [P.O.:1080; I.V.:359; IV Piggyback:100] Out: 2900 [Urine:2900] Intake/Output this shift:    Alert, nontoxic cta  Irregular Obese, soft, RUQ TTP  Lab Results:   Recent Labs  02/23/15 0619 02/24/15 0310  WBC 12.0* 9.3  HGB 14.0 14.8  HCT 41.7 43.2  PLT 192 216   BMET  Recent Labs  02/22/15 1215 02/23/15 0619  NA 138 137  K 3.7 3.9  CL 106 106  CO2 25 22  GLUCOSE 149* 143*  BUN 8 6  CREATININE 0.84 0.89  CALCIUM 9.3 8.9   PT/INR No results for input(s): LABPROT, INR in the last 72 hours. ABG No results for input(s): PHART, HCO3 in the last 72 hours.  Invalid input(s): PCO2, PO2  Studies/Results: Dg Abd 1 View  02/22/2015   CLINICAL DATA:  Abdominal pain. Pt c/o generalized abdominal pain, nausea, of vomiting of yellow substance x today. Hx appendectomy, hysterectomy and liver biopsy.  EXAM: ABDOMEN - 1 VIEW  COMPARISON:  CT 10/20/2010  FINDINGS: Small bowel decompressed. Moderate proximal colonic fecal material, decompressed distally. Surgical clips in the left pelvis.  No abnormal abdominal calcifications.  Mild degenerative changes in the lower lumbar spine.  IMPRESSION: 1. Nonobstructive bowel gas pattern.   Electronically Signed   By: Lucrezia Europe M.D.   On: 02/22/2015 15:08   US Abdomen Complete  02/22/2015   CLINICAL DATA:  Pain right upper quadrant radiating to left upper quadrant right flank since 3 a.m. with nausea and vomiting  EXAM: ULTRASOUND ABDOMEN COMPLETE  COMPARISON:  None.  FINDINGS: Gallbladder: 18 mm fell lodged in the neck of  the gallbladder. Gallbladder wall is not thickened and there is no Murphy's sign.  Common bile duct: Diameter: 5 mm  Liver: Fatty infiltration  IVC: No abnormality visualized.  Pancreas: Visualized portion unremarkable.  Spleen: Size and appearance within normal limits.  Right Kidney: Length: 13.6 cm. Echogenicity within normal limits. No mass or hydronephrosis visualized.  Left Kidney: Length: 13.3 cm. Echogenicity within normal limits. No mass or hydronephrosis visualized.  Abdominal aorta: No aneurysm visualized.  Other findings: None.  IMPRESSION: Cholelithiasis with nonmobile stone lodged in the neck of the gallbladder.   Electronically Signed   By: Skipper Cliche M.D.   On: 02/22/2015 15:15    Anti-infectives: Anti-infectives    Start     Dose/Rate Route Frequency Ordered Stop   02/24/15 2000  cefTRIAXone (ROCEPHIN) 2 g in dextrose 5 % 50 mL IVPB     2 g 100 mL/hr over 30 Minutes Intravenous Every 24 hours 02/24/15 0816     02/22/15 2100  cefTRIAXone (ROCEPHIN) 1 g in dextrose 5 % 50 mL IVPB  Status:  Discontinued     1 g 100 mL/hr over 30 Minutes Intravenous Every 24 hours 02/22/15 2025 02/24/15 0816      Assessment/Plan: Morbid obesity Symptomatic cholelithiasis vs cholecystitis A fib on hep gtt  No fever, wbc normal today. No cmet today. More tender today so I'm leaning more toward cholecystitis given abdominal exam.  If she is high risk for surgery  and/or to stop hep gtt for surgery then can get HIDA  If risk is low and no significant risk with stopping hep gtt, then would rec proceeding with lap cholecystectomy later today once hep gtt has been off Spoke with cards who will see her soon and let me know  UPDATE - cleared by cards for cholecystectomy. Hep gtt off at 0930  I believe the patient's symptoms are consistent with gallbladder disease.  We discussed gallbladder disease.  I discussed laparoscopic cholecystectomy with IOC in detail.  The patient was shown diagrams  detailing the procedure.  We discussed the risks and benefits of a laparoscopic cholecystectomy including, but not limited to bleeding, infection, injury to surrounding structures such as the intestine or liver, bile leak, retained gallstones, need to convert to an open procedure, prolonged diarrhea, blood clots such as  DVT, common bile duct injury, anesthesia risks, and possible need for additional procedures.  We discussed the typical post-operative recovery course. I explained that the likelihood of improvement of their symptoms is good.   Leighton Ruff. Redmond Pulling, MD, FACS General, Bariatric, & Minimally Invasive Surgery [facility], [place]   LOS: 2 days    Gayland Curry 02/24/2015

## 2015-02-24 NOTE — Progress Notes (Signed)
Subjective: Patient was continuing to feel some right upper quadrant pain, controlled with morphine. Also had moderate discomfort from constipation with no stools since the day before admission (9/16). She was NPO prior going for laproscopic cholecystectomy today.  Objective: Vital signs in last 24 hours: Filed Vitals:   02/24/15 1523 02/24/15 1540 02/24/15 1553 02/24/15 1608  BP: 94/39 103/55 108/53 120/52  Pulse: 60 56 56 57  Temp:      TempSrc:      Resp: 15 12 17 11   Height:      Weight:      SpO2: 98% 97% 98% 99%   Weight change:   Intake/Output Summary (Last 24 hours) at 02/24/15 1729 Last data filed at 02/24/15 1512  Gross per 24 hour  Intake 1934.13 ml  Output    710 ml  Net 1224.13 ml   GENERAL- alert, co-operative, obese, NAD HEENT- oral mucosa appears moist CARDIAC- RRR, no murmurs, rubs or gallops. RESP- CTAB, no wheezes or crackles. ABDOMEN- Soft, right upper quadrant tenderness to palpation, normoactive bowel sounds present throughout BACK- Normal curvature, no paraspinal tenderness, no CVA tenderness. EXTREMITIES- pulse 2+, symmetric, no pedal edema. SKIN- Warm, dry, No rash or lesion. PSYCH- appropriate mood and affect  Lab Results: Basic Metabolic Panel:  Recent Labs Lab 02/22/15 1215 02/23/15 0619  NA 138 137  K 3.7 3.9  CL 106 106  CO2 25 22  GLUCOSE 149* 143*  BUN 8 6  CREATININE 0.84 0.89  CALCIUM 9.3 8.9   Liver Function Tests:  Recent Labs Lab 02/22/15 1215 02/23/15 0619  AST 30 32  ALT 25 25  ALKPHOS 58 57  BILITOT 0.7 0.9  PROT 8.5* 7.5  ALBUMIN 4.2 3.5    Recent Labs Lab 02/22/15 1215  LIPASE 26   No results for input(s): AMMONIA in the last 168 hours. CBC:  Recent Labs Lab 02/23/15 0619 02/24/15 0310  WBC 12.0* 9.3  NEUTROABS 9.0*  --   HGB 14.0 14.8  HCT 41.7 43.2  MCV 91.9 93.1  PLT 192 216   Cardiac Enzymes: No results for input(s): CKTOTAL, CKMB, CKMBINDEX, TROPONINI in the last 168  hours. BNP: No results for input(s): PROBNP in the last 168 hours. D-Dimer: No results for input(s): DDIMER in the last 168 hours. CBG: No results for input(s): GLUCAP in the last 168 hours. Hemoglobin A1C: No results for input(s): HGBA1C in the last 168 hours. Fasting Lipid Panel: No results for input(s): CHOL, HDL, LDLCALC, TRIG, CHOLHDL, LDLDIRECT in the last 168 hours. Thyroid Function Tests:  Recent Labs Lab 02/22/15 1954  TSH 1.260   Coagulation: No results for input(s): LABPROT, INR in the last 168 hours. Anemia Panel: No results for input(s): VITAMINB12, FOLATE, FERRITIN, TIBC, IRON, RETICCTPCT in the last 168 hours. Urine Drug Screen: Drugs of Abuse  No results found for: LABOPIA, COCAINSCRNUR, LABBENZ, AMPHETMU, THCU, LABBARB  Alcohol Level: No results for input(s): ETH in the last 168 hours. Urinalysis:  Recent Labs Lab 02/22/15 1159  COLORURINE YELLOW  LABSPEC 1.018  PHURINE 6.5  GLUCOSEU NEGATIVE  HGBUR NEGATIVE  BILIRUBINUR NEGATIVE  KETONESUR 15*  PROTEINUR NEGATIVE  UROBILINOGEN 0.2  NITRITE NEGATIVE  LEUKOCYTESUR NEGATIVE    Micro Results: Recent Results (from the past 240 hour(s))  MRSA PCR Screening     Status: None   Collection Time: 02/22/15  6:45 PM  Result Value Ref Range Status   MRSA by PCR NEGATIVE NEGATIVE Final    Comment:  The GeneXpert MRSA Assay (FDA approved for NASAL specimens only), is one component of a comprehensive MRSA colonization surveillance program. It is not intended to diagnose MRSA infection nor to guide or monitor treatment for MRSA infections.    Studies/Results: No results found. Medications: I have reviewed the patient's current medications. Scheduled Meds: . [START ON 02/25/2015] cefTRIAXone (ROCEPHIN)  IV  2 g Intravenous Once  . diltiazem  60 mg Oral 4 times per day  . [START ON 02/25/2015] enoxaparin (LOVENOX) injection  40 mg Subcutaneous Q24H  . [START ON 02/25/2015] pneumococcal 23 valent  vaccine  0.5 mL Intramuscular Tomorrow-1000  . potassium chloride  40 mEq Oral BID  . promethazine  12.5 mg Intravenous Once  . sodium chloride  3 mL Intravenous Q12H   Continuous Infusions: . sodium chloride     PRN Meds:.docusate sodium, morphine injection, ondansetron **OR** ondansetron (ZOFRAN) IV, oxyCODONE-acetaminophen Assessment/Plan: Symptomatic cholelithiasis Underwent laproscopic cholecystectomy today. Will continue to need adequate pain control but should be curative after recovery and no abnormal or unexpected findings in surgical report. - Morphine 1-2 mg IV every 2 hours when necessary - Zofran 4 mg every 6 hours when necessary - F/U AM CBC, CMP  A. fib with RVR Hemodynamically stable, rate very well controlled in the 60s-100s over interval. Per cardiology recs she is quite low risk and likely does not need cardioversion even if Afib remains overnight on monitoring - Unfractionated Heparin infusion - Continue PO cardizem - Cardiac monitoring  Constipation Patient on low PO intake plus fairly high doses of morphine for pain control. - Docusate 50mg  PO  Diet: Regular DVT ppx: On heparin drip FULL CODE  Dispo: Disposition is deferred at this time, awaiting improvement of current medical problems.   The patient does not have a current PCP (No primary care provider on file.) and does not know need an [facility] hospital follow-up appointment after discharge.  The patient does not know have transportation limitations that hinder transportation to clinic appointments.    LOS: 2 days   Collier Salina, MD 02/24/2015, 5:29 PM

## 2015-02-24 NOTE — Progress Notes (Signed)
  Date: 02/24/2015  Patient name: Christie Atkinson  Medical record number: [id number]  Date of birth: 11/29/1955    ID: Ms. Bartz is a 59yo woman with PMH of gestational DM, osteoarthritis, fibromyalgia who presented  With abdominal RUQ pain , nausea, vomiting and diarrhea.   The pain was 10/10 but now improved with pain medication. on ultrasound, she was found to have an obstructing gallstone. She was also found to have new onset Afib with RVR.  She was started on a diltiazem drip then switched to oral diltiazem for rate control of afib. She is being followed by cardiology and general surgery  S:  Better abdominal pain, denies chest pain, passing flatus but no bowel movement since admit.  i have reviewed her meds, and notes/evaluation by general surgery and cardiology  BP 120/52 mmHg  Pulse 57  Temp(Src) 98.2 F (36.8 C) (Oral)  Resp 11  Ht 5' 6.5" (1.689 m)  Wt 260 lb 5.8 oz (118.1 kg)  BMI 41.40 kg/m2  SpO2 99% Physical Exam  Constitutional:  oriented to person, place, and time. appears well-developed and well-nourished. No distress.  HENT: [place]/AT, PERRLA, no scleral icterus Mouth/Throat: Oropharynx is clear and moist. No oropharyngeal exudate.  Cardiovascular: Normal rate, regular rhythm and normal heart sounds. Exam reveals no gallop and no friction rub.  No murmur heard.  Pulmonary/Chest: Effort normal and breath sounds normal. No respiratory distress.  has no wheezes.  Neck = supple, no nuchal rigidity Abdominal: Soft. Bowel sounds are normal.  mild distension. RUQ pain with mild palpation Skin: Skin is warm and dry. No rash noted. No erythema.  Psychiatric: a normal mood and affect.  behavior is normal.   Labs: CBC    Component Value Date/Time   WBC 9.3 02/24/2015 0310   RBC 4.64 02/24/2015 0310   HGB 14.8 02/24/2015 0310   HCT 43.2 02/24/2015 0310   PLT 216 02/24/2015 0310   MCV 93.1 02/24/2015 0310   MCH 31.9 02/24/2015 0310   MCHC 34.3 02/24/2015 0310   RDW  13.0 02/24/2015 0310   LYMPHSABS 1.8 02/23/2015 0619   MONOABS 1.2* 02/23/2015 0619   EOSABS 0.0 02/23/2015 0619   BASOSABS 0.0 02/23/2015 0619    BMET    Component Value Date/Time   NA 137 02/23/2015 0619   K 3.9 02/23/2015 0619   CL 106 02/23/2015 0619   CO2 22 02/23/2015 0619   GLUCOSE 143* 02/23/2015 0619   BUN 6 02/23/2015 0619   CREATININE 0.89 02/23/2015 0619   CALCIUM 8.9 02/23/2015 0619   GFRNONAA >60 02/23/2015 0619   GFRAA >60 02/23/2015 0619    Assessment and Plan: 59yo F with cholelithiasis presented with sudden onset RUQ likely for obstructive gallstone, pain causing secondary afib with RVR. I have seen and evaluated [name] and discussed their care with the Residency Team.  - agree with surgery plan to have patient undergo cholecystectomy  - afib appears under good rate control, continue on telemetry, and diltiazem for rate control. Per discussion with cardiology, plan to treat with aspirin and have her follow up with cardiology as outpatient if she remains asymptomatic. Will keep on telemetry overnight to see if she returns back to sinus rhythm.  -constipation = likely situational in part due to poor po intake, and also getting morphine for abdominal pain. Will give stool softener    [name], MD 02/24/2015, 4:54 PM

## 2015-02-25 ENCOUNTER — Encounter (HOSPITAL_COMMUNITY): Payer: Self-pay | Admitting: General Surgery

## 2015-02-25 DIAGNOSIS — K8001 Calculus of gallbladder with acute cholecystitis with obstruction: Secondary | ICD-10-CM | POA: Diagnosis not present

## 2015-02-25 LAB — CBC
HCT: 36.8 % (ref 36.0–46.0)
Hemoglobin: 12 g/dL (ref 12.0–15.0)
MCH: 30.6 pg (ref 26.0–34.0)
MCHC: 32.6 g/dL (ref 30.0–36.0)
MCV: 93.9 fL (ref 78.0–100.0)
PLATELETS: 217 10*3/uL (ref 150–400)
RBC: 3.92 MIL/uL (ref 3.87–5.11)
RDW: 13.3 % (ref 11.5–15.5)
WBC: 11.1 10*3/uL — AB (ref 4.0–10.5)

## 2015-02-25 LAB — BASIC METABOLIC PANEL
ANION GAP: 5 (ref 5–15)
BUN: 7 mg/dL (ref 6–20)
CALCIUM: 8.2 mg/dL — AB (ref 8.9–10.3)
CO2: 25 mmol/L (ref 22–32)
Chloride: 108 mmol/L (ref 101–111)
Creatinine, Ser: 1.14 mg/dL — ABNORMAL HIGH (ref 0.44–1.00)
GFR, EST AFRICAN AMERICAN: 59 mL/min — AB (ref >60)
GFR, EST NON AFRICAN AMERICAN: 51 mL/min — AB (ref >60)
Glucose, Bld: 118 mg/dL — ABNORMAL HIGH (ref 65–99)
POTASSIUM: 4.4 mmol/L (ref 3.5–5.1)
Sodium: 138 mmol/L (ref 135–145)

## 2015-02-25 MED ORDER — DILTIAZEM HCL ER COATED BEADS 180 MG PO CP24
180.0000 mg | ORAL_CAPSULE | Freq: Every day | ORAL | Status: DC
  Administered 2015-02-25: 180 mg via ORAL
  Filled 2015-02-25 (×2): qty 1

## 2015-02-25 MED ORDER — DIPHENHYDRAMINE HCL 25 MG PO CAPS
25.0000 mg | ORAL_CAPSULE | Freq: Four times a day (QID) | ORAL | Status: DC | PRN
  Administered 2015-02-25 – 2015-02-26 (×4): 25 mg via ORAL
  Filled 2015-02-25 (×4): qty 1

## 2015-02-25 MED ORDER — DOCUSATE SODIUM 100 MG PO CAPS
100.0000 mg | ORAL_CAPSULE | Freq: Every day | ORAL | Status: DC | PRN
  Administered 2015-02-25 – 2015-02-26 (×3): 100 mg via ORAL
  Filled 2015-02-25 (×3): qty 1

## 2015-02-25 MED ORDER — DIPHENHYDRAMINE HCL 25 MG PO CAPS
25.0000 mg | ORAL_CAPSULE | Freq: Once | ORAL | Status: AC
  Administered 2015-02-25: 25 mg via ORAL
  Filled 2015-02-25: qty 1

## 2015-02-25 NOTE — Progress Notes (Signed)
Pt being transferred to 5W31. Report called to Ginger on 5W. Pt eating lunch. Will transfer as soon as she is finished.

## 2015-02-25 NOTE — Progress Notes (Signed)
Received report from [place] on [facility]

## 2015-02-25 NOTE — Progress Notes (Signed)
Patient arrived to Christie Atkinson. Patient gave pain score 6/10 and all questions answered. Patient oriented to unit. Skin assessment completed with Ginger Gleason RN. Laparotomy sites on abdomen clean, dry, and intact. Nursing staff will continue to monitor. Earleen Reaper, RN

## 2015-02-25 NOTE — Anesthesia Postprocedure Evaluation (Signed)
  Anesthesia Post-op Note  Patient: Christie Atkinson  Procedure(s) Performed: Procedure(s): LAPAROSCOPIC CHOLECYSTECTOMY (N/A)  Patient Location: PACU  Anesthesia Type:General  Level of Consciousness: awake  Airway and Oxygen Therapy: Patient Spontanous Breathing  Post-op Pain: mild  Post-op Assessment: Post-op Vital signs reviewed, Patient's Cardiovascular Status Stable, Respiratory Function Stable, Patent Airway, No signs of Nausea or vomiting and Pain level controlled              Post-op Vital Signs: Reviewed and stable  Last Vitals:  Filed Vitals:   02/25/15 1410  BP: 169/100  Pulse: 68  Temp: 37.3 C  Resp: 16    Complications: No apparent anesthesia complications

## 2015-02-25 NOTE — Care Management Important Message (Signed)
Important Message  Patient Details  Name: Christie Atkinson MRN: [id number] Date of Birth: [birth year]   Medicare Important Message Given:  Yes-second notification given    Nathen May 02/25/2015, 11:26 AM

## 2015-02-25 NOTE — Progress Notes (Signed)
Date: 02/25/2015  Patient name: Christie Atkinson  Medical record number: [id number]  Date of birth: Mar 18, 1956    ID: Ms. Yott is a 59yo woman with PMH of gestational DM, osteoarthritis, fibromyalgia who presented  With abdominal RUQ pain , nausea, vomiting and diarrhea.   The pain was 10/10 but now improved with pain medication. on ultrasound, she was found to have an obstructing gallstone. She was also found to have new onset Afib with RVR.  She was started on a diltiazem drip then switched to oral diltiazem for rate control of afib. She is being followed by cardiology and general surgery.POD#1 s/p cholecystectomy  S:  Having some abdominal discomfort from diffuse swelling likely from surgery, still less pain than on admit. Has ambulated in room without assistance. No chest pain or palpitations. She is concerned that she is not reaching goal of incentive spirometer. Has mild dyspnea with deep breathing.  Telemetry: NSR, has occ HR 60s   i have reviewed her meds, and OR notes/evaluation by general surgery and cardiology  BP 169/100 mmHg  Pulse 68  Temp(Src) 99.1 F (37.3 C) (Oral)  Resp 16  Ht 5' 6.5" (1.689 m)  Wt 260 lb 5.8 oz (118.1 kg)  BMI 41.40 kg/m2  SpO2 96% Physical Exam  Constitutional:  oriented to person, place, and time. appears well-developed and well-nourished. No distress.  HENT: [place]/AT, PERRLA, no scleral icterus Mouth/Throat: Oropharynx is clear and moist. No oropharyngeal exudate.  Cardiovascular: Normal rate, regular rhythm and normal heart sounds. Exam reveals no gallop and no friction rub.  No murmur heard.  Pulmonary/Chest: Effort normal and breath sounds normal. No respiratory distress.  has no wheezes.  Abdominal: Soft. Protuberant. Mild TTP Skin: Skin is warm and dry. No rash noted. No erythema.  Psychiatric: a normal mood and affect.  behavior is normal.   Labs: CBC    Component Value Date/Time   WBC 11.1* 02/25/2015 0054   RBC 3.92 02/25/2015  0054   HGB 12.0 02/25/2015 0054   HCT 36.8 02/25/2015 0054   PLT 217 02/25/2015 0054   MCV 93.9 02/25/2015 0054   MCH 30.6 02/25/2015 0054   MCHC 32.6 02/25/2015 0054   RDW 13.3 02/25/2015 0054   LYMPHSABS 1.8 02/23/2015 0619   MONOABS 1.2* 02/23/2015 0619   EOSABS 0.0 02/23/2015 0619   BASOSABS 0.0 02/23/2015 0619    BMET    Component Value Date/Time   NA 138 02/25/2015 0054   K 4.4 02/25/2015 0054   CL 108 02/25/2015 0054   CO2 25 02/25/2015 0054   GLUCOSE 118* 02/25/2015 0054   BUN 7 02/25/2015 0054   CREATININE 1.14* 02/25/2015 0054   CALCIUM 8.2* 02/25/2015 0054   GFRNONAA 51* 02/25/2015 0054   GFRAA 59* 02/25/2015 0054    Assessment and Plan: 59yo F with cholelithiasis presented with sudden onset RUQ likely for obstructive gallstone, pain causing secondary afib with RVR POD#1 s/p cholecystectomy and now resolution of arrhythmia   I have seen and evaluated Christie Atkinson and discussed their care with the Residency Team.  - cholelithiasis = will finish last dose of antibiotics has had definitive treatment with cholecystectomy.  - afib = appears that she is going back into sinus rhythm. Per cardiology, will change her to lower dose of diltiazem at 180mg  daily plus ASA to see how she tolerates. Anticipate to follow up with cardiology as o/p.  -constipation =passing flatus, and ambulating. Anticipate improvement with stool softener  dispo anticipate discharge tomorrow.  Christie Basques, MD 02/25/2015, 3:16 PM

## 2015-02-25 NOTE — Progress Notes (Signed)
Patient Name: Christie Atkinson Date of Encounter: 02/25/2015  Active Problems:   Cholelithiasis   Preoperative cardiovascular examination   Atrial fibrillation   Elevated blood pressure reading without diagnosis of hypertension   Abdominal pain   Calculus of gallbladder with biliary obstruction but without cholecystitis   Symptomatic cholelithiasis   Primary Cardiologist: Dr Tamala Julian (saw in [place])  Patient Profile: 59 yo female w/ hx fibromyalgia, urinary retention, was admitted 09/17 w/ symptomatic cholelithiasis and rapid afib.  SUBJECTIVE: Not aware of when she went into SR, sore all over and has belly pain, no chest pain or SOB.  OBJECTIVE Filed Vitals:   02/25/15 0048 02/25/15 0140 02/25/15 0300 02/25/15 0615  BP: 95/49 100/55 90/52 109/64  Pulse:   65   Temp: 98 F (36.7 C)  97.9 F (36.6 C)   TempSrc: Oral  Oral   Resp:   15   Height:      Weight:      SpO2:   94%     Intake/Output Summary (Last 24 hours) at 02/25/15 0744 Last data filed at 02/25/15 0300  Gross per 24 hour  Intake   2766 ml  Output   1285 ml  Net   1481 ml   Filed Weights   02/22/15 1140 02/22/15 1833  Weight: 260 lb (117.935 kg) 260 lb 5.8 oz (118.1 kg)    PHYSICAL EXAM General: Well developed, well nourished, female in no acute distress. Head: Normocephalic, atraumatic.  Neck: Supple without bruits, JVD difficult to assess 2nd body habitus. Lungs:  Resp regular and unlabored, CTA. Heart: RRR, S1, S2, no S3, S4, 2/6 murmur; no rub. Abdomen: Soft, non-tender, non-distended, BS + x 4.  Extremities: No clubbing, cyanosis, trace edema.  Neuro: Alert and oriented X 3. Moves all extremities spontaneously. Psych: Normal affect.  LABS: CBC: Recent Labs  02/23/15 0619 02/24/15 0310 02/25/15 0054  WBC 12.0* 9.3 11.1*  NEUTROABS 9.0*  --   --   HGB 14.0 14.8 12.0  HCT 41.7 43.2 36.8  MCV 91.9 93.1 93.9  PLT 192 216 595   Basic Metabolic Panel: Recent Labs  02/23/15 0619  02/25/15 0054  NA 137 138  K 3.9 4.4  CL 106 108  CO2 22 25  GLUCOSE 143* 118*  BUN 6 7  CREATININE 0.89 1.14*  CALCIUM 8.9 8.2*   Liver Function Tests: Recent Labs  02/22/15 1215 02/23/15 0619  AST 30 32  ALT 25 25  ALKPHOS 58 57  BILITOT 0.7 0.9  PROT 8.5* 7.5  ALBUMIN 4.2 3.5   Thyroid Function Tests: Recent Labs  02/22/15 1954  TSH 1.260   TELE:  SR, S brady 96s, since last pm  Echo 02/23/15: Left ventricle: The cavity size was normal. Wall thickness was increased in a pattern of mild LVH. Systolic function was normal. The estimated ejection fraction was in the range of 55% to 60%. Wall motion was normal; there were no regional wall motion abnormalities. - Left atrium: The atrium was moderately to severely dilated. Impressions: - Normal LV systolic function; mild LVH; moderate to severe LAE; trace MR and TR.  Current Medications:  . cefTRIAXone (ROCEPHIN)  IV  2 g Intravenous Once  . diltiazem  60 mg Oral 4 times per day  . enoxaparin (LOVENOX) injection  40 mg Subcutaneous Q24H  . pneumococcal 23 valent vaccine  0.5 mL Intramuscular Tomorrow-1000  . potassium chloride  40 mEq Oral BID  . sodium chloride  3 mL  Intravenous Q12H   . sodium chloride 1,000 mL (02/25/15 0147)    ASSESSMENT AND PLAN: 1. Atrial fibrillation: Uncertain duration. CHADS VASC score of 1. Rate controlled on oral Cardizem, will change 60 mg q 6 h>> 180 mg daily as she has never gotten more than 3 doses in 24 hr and HR 50s at times.  Echo with normal LV function. Moderate left atrial enlargement. NO significant valvular disease. Based on current data, the only feature of her history that would increase her risk of stroke would be her gender (CHADS VASC score 1 due to gender). ASA would be appropriate at this time although she could opt for anti-coagulation also.   OK  to d/c when medically stable with Cardizem and ASA. We will see prn here and f/u as OP.   MD advise on event  monitor to determine afib burden.   Otherwise, per IM/CCS, consider decreasing IV to 75 cc/hr. Active Problems:   Cholelithiasis   Preoperative cardiovascular examination   Atrial fibrillation   Elevated blood pressure reading without diagnosis of hypertension   Abdominal pain   Calculus of gallbladder with biliary obstruction but without cholecystitis   Symptomatic cholelithiasis   Signed, Lenoard Aden 7:44 AM 02/25/2015  Patient seen, examined. Available data reviewed. Agree with findings, assessment, and plan as outlined by Rosaria Ferries, PA-C. The patient was independently interviewed and examined. I have reviewed her telemetry which shows normal sinus rhythm over the last 24 hours. I agree with discharge on aspirin and Cardizem. With CHADS-Vasc = 1 and atrial fib limited to period of acute illness, I think risk of oral anticoagulation outweighs potential benefit. Will arrange outpatient follow-up. thx  Sherren Mocha, M.D. 02/25/2015 12:57 PM

## 2015-02-25 NOTE — Progress Notes (Signed)
Patient ID: Christie Atkinson, female   DOB: 12/18/1955, 59 y.o.   MRN: [id number] 1 Day Post-Op  Subjective: Pt sore, but pain is better.  Tolerating clear liquids with no nausea.  Objective: Vital signs in last 24 hours: Temp:  [97.8 F (36.6 C)-98.4 F (36.9 C)] 97.9 F (36.6 C) (09/20 0300) Pulse Rate:  [56-90] 65 (09/20 0300) Resp:  [11-17] 15 (09/20 0300) BP: (90-125)/(39-69) 109/64 mmHg (09/20 0615) SpO2:  [94 %-100 %] 94 % (09/20 0300) Last BM Date: 02/22/15  Intake/Output from previous day: 09/19 0701 - 09/20 0700 In: 2766 [P.O.:360; I.V.:2406] Out: 1285 [Urine:1275; Blood:10] Intake/Output this shift:    PE: Abd: soft, appropriately tender, +BS, ND, obese, incisions c/d/i with minimal old bloody drainage on umbilical incision  Lab Results:   Recent Labs  02/24/15 0310 02/25/15 0054  WBC 9.3 11.1*  HGB 14.8 12.0  HCT 43.2 36.8  PLT 216 217   BMET  Recent Labs  02/23/15 0619 02/25/15 0054  NA 137 138  K 3.9 4.4  CL 106 108  CO2 22 25  GLUCOSE 143* 118*  BUN 6 7  CREATININE 0.89 1.14*  CALCIUM 8.9 8.2*   PT/INR No results for input(s): LABPROT, INR in the last 72 hours. CMP     Component Value Date/Time   NA 138 02/25/2015 0054   K 4.4 02/25/2015 0054   CL 108 02/25/2015 0054   CO2 25 02/25/2015 0054   GLUCOSE 118* 02/25/2015 0054   BUN 7 02/25/2015 0054   CREATININE 1.14* 02/25/2015 0054   CALCIUM 8.2* 02/25/2015 0054   PROT 7.5 02/23/2015 0619   ALBUMIN 3.5 02/23/2015 0619   AST 32 02/23/2015 0619   ALT 25 02/23/2015 0619   ALKPHOS 57 02/23/2015 0619   BILITOT 0.9 02/23/2015 0619   GFRNONAA 51* 02/25/2015 0054   GFRAA 59* 02/25/2015 0054   Lipase     Component Value Date/Time   LIPASE 26 02/22/2015 1215       Studies/Results: No results found.  Anti-infectives: Anti-infectives    Start     Dose/Rate Route Frequency Ordered Stop   02/25/15 1400  cefTRIAXone (ROCEPHIN) 2 g in dextrose 5 % 50 mL IVPB     2 g 100 mL/hr over  30 Minutes Intravenous  Once 02/24/15 1651     02/24/15 2000  cefTRIAXone (ROCEPHIN) 2 g in dextrose 5 % 50 mL IVPB  Status:  Discontinued     2 g 100 mL/hr over 30 Minutes Intravenous Every 24 hours 02/24/15 0816 02/24/15 1651   02/22/15 2100  cefTRIAXone (ROCEPHIN) 1 g in dextrose 5 % 50 mL IVPB  Status:  Discontinued     1 g 100 mL/hr over 30 Minutes Intravenous Every 24 hours 02/22/15 2025 02/24/15 0816       Assessment/Plan  POD 1, s/p lap chole for acute cholecystitis -advance diet -mobilize and pulm toilet -1 dose of post op abx today, then DC  -convert to oral pain meds A fib -per primary and cards  LOS: 3 days    Dreyton Roessner E 02/25/2015, 7:30 AM Pager: [phone]

## 2015-02-25 NOTE — Progress Notes (Signed)
Patient reported burning upon urination and had chills. Checked temp, 98.1. Nursing staff will continue to monitor. Earleen Reaper, RN

## 2015-02-25 NOTE — Progress Notes (Signed)
Writer entered patients room to find patient yelling about the IV pole beeping.  Pt was very demanding and complaining about care.  Pt then stated that she wanted to leave the hospital and wanted to call her doctor.  Writer called Agricultural consultant and paged MD on call.  Pt refused IV fluids and Lovenox, MD aware.

## 2015-02-25 NOTE — Evaluation (Signed)
Physical Therapy Evaluation Patient Details Name: Christie Atkinson MRN: [id number] DOB: [birth year] Today's Date: 02/25/2015   History of Present Illness  Christie Atkinson is a 59yo woman with PMH of gestational DM, osteoarthritis, fibromyalgia who presented With abdominal RUQ pain , nausea, vomiting and diarrhea.  The pain was 10/10 but now improved with pain medication. on ultrasound, she was found to have an obstructing gallstone. She was also found to have new onset Afib with RVR. She was started on a diltiazem drip then switched to oral diltiazem for rate control of afib. She is being followed by cardiology and general surgery.POD#1 s/p cholecystectomy  Clinical Impression  Pt with sleepiness from benydrl however tolerated mobility well. Pt able to tolerate ambulation with RW however deferred stair negotiation due to dizziness. Anticipate pt to be safe to d/c home with 24/7 assist from children once medically stable. Pt with good home set up and recommended DME.    Follow Up Recommendations No PT follow up;Supervision/Assistance - 24 hour    Equipment Recommendations  Rolling walker with 5" wheels (pt already has)    Recommendations for Other Services       Precautions / Restrictions Precautions Precautions: Fall Precaution Comments: dizzy from benydrl Restrictions Weight Bearing Restrictions: No      Mobility  Bed Mobility Overal bed mobility: Needs Assistance Bed Mobility: Rolling;Sidelying to Sit;Sit to Supine Rolling: Supervision Sidelying to sit: Supervision   Sit to supine: Supervision   General bed mobility comments: used bed rail, able to swing legs back up into bed  Transfers Overall transfer level: Needs assistance Equipment used: Rolling walker (2 wheeled) Transfers: Sit to/from Stand Sit to Stand: Min guard         General transfer comment: v/c's to push up from bed, walker safety  Ambulation/Gait Ambulation/Gait assistance: Min guard Ambulation  Distance (Feet): 150 Feet Assistive device: Rolling walker (2 wheeled) Gait Pattern/deviations: Step-through pattern;Decreased stride length;Trunk flexed Gait velocity: decreased but typically for surgery Gait velocity interpretation: Below normal speed for age/gender General Gait Details: pt c/o lightheaded from benydrl and sleepy. pt without knee buckling or LOB however with lateral and ant/post sway due to dizziness. dizziness ceased once pt laid down   Stairs Stairs:  (deferred due to dizziness)          Wheelchair Mobility    Modified Rankin (Stroke Patients Only)       Balance Overall balance assessment: Needs assistance Sitting-balance support: Feet supported;No upper extremity supported Sitting balance-Leahy Scale: Good     Standing balance support: Bilateral upper extremity supported Standing balance-Leahy Scale: Poor Standing balance comment: needs RW at this time                             Pertinent Vitals/Pain Pain Assessment: 0-10 Pain Score: 4  Pain Location: abdomen Pain Descriptors / Indicators: Aching Pain Intervention(s): Monitored during session    Home Living Family/patient expects to be discharged to:: Private residence Living Arrangements: Children Available Help at Discharge: Family;Available 24 hours/day Type of Home: House Home Access: Stairs to enter Entrance Stairs-Rails: Can reach both Entrance Stairs-Number of Steps: 5 Home Layout: One level Home Equipment: Walker - 2 wheels;Shower seat      Prior Function Level of Independence: Independent         Comments: had to use RW day she arrived to hospital due to [place]: Right  Extremity/Trunk Assessment   Upper Extremity Assessment: Overall WFL for tasks assessed           Lower Extremity Assessment: Overall WFL for tasks assessed      Cervical / Trunk Assessment: Normal  Communication   Communication: No difficulties   Cognition Arousal/Alertness: Awake/alert (but sleepy from benydrl) Behavior During Therapy: WFL for tasks assessed/performed Overall Cognitive Status: Within Functional Limits for tasks assessed                      General Comments      Exercises        Assessment/Plan    PT Assessment Patient needs continued PT services  PT Diagnosis Generalized weakness;Difficulty walking   PT Problem List Decreased strength;Decreased activity tolerance;Decreased balance;Decreased mobility;Decreased knowledge of use of DME  PT Treatment Interventions DME instruction;Gait training;Stair training;Functional mobility training;Therapeutic activities;Therapeutic exercise   PT Goals (Current goals can be found in the Care Plan section) Acute Rehab PT Goals PT Goal Formulation: With patient Time For Goal Achievement: 03/04/15 Potential to Achieve Goals: Good    Frequency Min 3X/week   Barriers to discharge        Co-evaluation               End of Session Equipment Utilized During Treatment: Gait belt Activity Tolerance: Patient tolerated treatment well Patient left: in bed;with call bell/phone within reach;with bed alarm set Nurse Communication: Mobility status (dizziness with amb, SpO2 >94% on RA)         Time: 6226-3335 PT Time Calculation (min) (ACUTE ONLY): 27 min   Charges:   PT Evaluation $Initial PT Evaluation Tier I: 1 Procedure PT Treatments $Gait Training: 8-22 mins   PT G CodesKingsley Callander 02/25/2015, 4:12 PM   Kittie Plater, PT, DPT Pager #: [phone] Office #: [phone]

## 2015-02-25 NOTE — Progress Notes (Signed)
Subjective: Patient doing well today, moved to bedside without assistance and using incentive spirometry frequently. Pain at surgical site is significant but controlled on current medication. She has no fevers, no nausea, and has been passing flatus but no stools. She appears to have spontaneously converted to sinus rhythm from Afib according to monitor. Transferred to floor from step down.  Objective: Vital signs in last 24 hours: Filed Vitals:   02/25/15 0140 02/25/15 0300 02/25/15 0615 02/25/15 1410  BP: 100/55 90/52 109/64 169/100  Pulse:  65  68  Temp:  97.9 F (36.6 C)  99.1 F (37.3 C)  TempSrc:  Oral  Oral  Resp:  15  16  Height:      Weight:      SpO2:  94%  96%   Weight change:   Intake/Output Summary (Last 24 hours) at 02/25/15 1535 Last data filed at 02/25/15 1507  Gross per 24 hour  Intake   1825 ml  Output   1075 ml  Net    750 ml   GENERAL- alert, co-operative, obese, NAD HEENT- oral mucosa appears moist CARDIAC- RRR, no murmurs, rubs or gallops. RESP- CTAB, mild end-inspiratory crackles at bases ABDOMEN- Soft, minimal TTP, no ecchymoses, dressing on laparoscopic incisions all clean/dry/intact BACK- Normal curvature, no paraspinal tenderness, no CVA tenderness. EXTREMITIES- pulse 2+, symmetric, no pedal edema. SKIN- Warm, dry, No rash or lesion. PSYCH- appropriate mood and affect  Lab Results: Basic Metabolic Panel:  Recent Labs Lab 02/23/15 0619 02/25/15 0054  NA 137 138  K 3.9 4.4  CL 106 108  CO2 22 25  GLUCOSE 143* 118*  BUN 6 7  CREATININE 0.89 1.14*  CALCIUM 8.9 8.2*   Liver Function Tests:  Recent Labs Lab 02/22/15 1215 02/23/15 0619  AST 30 32  ALT 25 25  ALKPHOS 58 57  BILITOT 0.7 0.9  PROT 8.5* 7.5  ALBUMIN 4.2 3.5    Recent Labs Lab 02/22/15 1215  LIPASE 26   No results for input(s): AMMONIA in the last 168 hours. CBC:  Recent Labs Lab 02/23/15 0619 02/24/15 0310 02/25/15 0054  WBC 12.0* 9.3 11.1*    NEUTROABS 9.0*  --   --   HGB 14.0 14.8 12.0  HCT 41.7 43.2 36.8  MCV 91.9 93.1 93.9  PLT 192 216 217   Cardiac Enzymes: No results for input(s): CKTOTAL, CKMB, CKMBINDEX, TROPONINI in the last 168 hours. BNP: No results for input(s): PROBNP in the last 168 hours. D-Dimer: No results for input(s): DDIMER in the last 168 hours. CBG: No results for input(s): GLUCAP in the last 168 hours. Hemoglobin A1C: No results for input(s): HGBA1C in the last 168 hours. Fasting Lipid Panel: No results for input(s): CHOL, HDL, LDLCALC, TRIG, CHOLHDL, LDLDIRECT in the last 168 hours. Thyroid Function Tests:  Recent Labs Lab 02/22/15 1954  TSH 1.260   Coagulation: No results for input(s): LABPROT, INR in the last 168 hours. Anemia Panel: No results for input(s): VITAMINB12, FOLATE, FERRITIN, TIBC, IRON, RETICCTPCT in the last 168 hours. Urine Drug Screen: Drugs of Abuse  No results found for: LABOPIA, COCAINSCRNUR, LABBENZ, AMPHETMU, THCU, LABBARB  Alcohol Level: No results for input(s): ETH in the last 168 hours. Urinalysis:  Recent Labs Lab 02/22/15 1159  COLORURINE YELLOW  LABSPEC 1.018  PHURINE 6.5  GLUCOSEU NEGATIVE  HGBUR NEGATIVE  BILIRUBINUR NEGATIVE  KETONESUR 15*  PROTEINUR NEGATIVE  UROBILINOGEN 0.2  NITRITE NEGATIVE  LEUKOCYTESUR NEGATIVE    Micro Results: Recent Results (from the  past 240 hour(s))  MRSA PCR Screening     Status: None   Collection Time: 02/22/15  6:45 PM  Result Value Ref Range Status   MRSA by PCR NEGATIVE NEGATIVE Final    Comment:        The GeneXpert MRSA Assay (FDA approved for NASAL specimens only), is one component of a comprehensive MRSA colonization surveillance program. It is not intended to diagnose MRSA infection nor to guide or monitor treatment for MRSA infections.    Studies/Results: No results found. Medications: I have reviewed the patient's current medications. Scheduled Meds: . diltiazem  180 mg Oral Daily  .  enoxaparin (LOVENOX) injection  40 mg Subcutaneous Q24H  . potassium chloride  40 mEq Oral BID  . sodium chloride  3 mL Intravenous Q12H   Continuous Infusions: . sodium chloride 1,000 mL (02/25/15 0147)   PRN Meds:.diphenhydrAMINE, docusate sodium, ondansetron **OR** ondansetron (ZOFRAN) IV, oxyCODONE-acetaminophen Assessment/Plan: Symptomatic cholelithiasis Well healing incisions and well controlled pain. No evidence of obstruction or significant ileus. Will walk her today with PT and maybe discharge tonight if ambulates well. Otherwise expect discharge tomorrow. - Zofran 4 mg every 6 hours when necessary - Percocet 5mg  q4hrs PRN  A. fib with RVR Apparently resolved, spontaneous conversion to NSR. Will - Change to 180 dilt 24-hr PO qday  Pruritis Complaint of moderate pruritis diffusely since yesterday. She states this is common at home and uses nutritional supplements to manage. - Benadryl 25mg  PO q6hrs PRN  Constipation Patient on low PO intake plus fairly high doses of morphine for pain control. - Docusate 50mg  PO  Diet: Regular DVT ppx: On heparin drip FULL CODE  Dispo: Disposition is deferred at this time, awaiting improvement of current medical problems. Discharge to home planning in approximately 0-1 days.  The patient does not have a current PCP (No primary care provider on file.) and does not know need an [facility] hospital follow-up appointment after discharge.  The patient does not know have transportation limitations that hinder transportation to clinic appointments.   LOS: 3 days   Collier Salina, MD 02/25/2015, 3:35 PM

## 2015-02-26 ENCOUNTER — Telehealth: Payer: Self-pay | Admitting: Internal Medicine

## 2015-02-26 LAB — BASIC METABOLIC PANEL
ANION GAP: 5 (ref 5–15)
BUN: 7 mg/dL (ref 6–20)
CALCIUM: 8.6 mg/dL — AB (ref 8.9–10.3)
CO2: 26 mmol/L (ref 22–32)
Chloride: 102 mmol/L (ref 101–111)
Creatinine, Ser: 0.91 mg/dL (ref 0.44–1.00)
Glucose, Bld: 118 mg/dL — ABNORMAL HIGH (ref 65–99)
POTASSIUM: 4.3 mmol/L (ref 3.5–5.1)
Sodium: 133 mmol/L — ABNORMAL LOW (ref 135–145)

## 2015-02-26 MED ORDER — TRAMADOL HCL 50 MG PO TABS: 50.0000 mg | ORAL_TABLET | Freq: Four times a day (QID) | ORAL | Status: AC | PRN

## 2015-02-26 MED ORDER — OXYCODONE-ACETAMINOPHEN 2.5-325 MG PO TABS: 1.0000 | ORAL_TABLET | ORAL | Status: DC | PRN

## 2015-02-26 MED ORDER — ASPIRIN 81 MG PO CHEW: 81.0000 mg | CHEWABLE_TABLET | Freq: Every day | ORAL | Status: DC

## 2015-02-26 NOTE — Care Management Note (Signed)
Case Management Note  Patient Details  Name: ISABELLAH SOBOCINSKI MRN: [id number] Date of Birth: Jan 26, 1956  Subjective/Objective:      Date: 921/16 Spoke with patient at the bedside along with daughter. Introduced self as Tourist information centre manager and explained role in discharge planning and how to be reached. Verified patient lives in [place] with her two daughters, has DME rolling walker . Expressed no need for no other DME. Verified patient anticipates to go home with family,  at time of discharge and will have  part-time supervision by family  at this time to best of their knowledge. Patient  denied needing help with their medication. Patient drives to  MD appointments. Verified patient has PCP [facility].   Plan: CM will continue to follow for discharge planning and [facility] resources.               Action/Plan:   Expected Discharge Date:                  Expected Discharge Plan:  Home/Self Care  In-House Referral:     Discharge planning Services  CM Consult  Post Acute Care Choice:    Choice offered to:     DME Arranged:    DME Agency:     HH Arranged:    [place] Agency:     Status of Service:  Completed, signed off  Medicare Important Message Given:  Yes-second notification given Date Medicare IM Given:    Medicare IM give by:    Date Additional Medicare IM Given:    Additional Medicare Important Message give by:     If discussed at [place] of Stay Meetings, dates discussed:    Additional Comments:  Zenon Mayo, RN 02/26/2015, 2:36 PM

## 2015-02-26 NOTE — Progress Notes (Signed)
Date: 02/26/2015  Patient name: Christie Atkinson  Medical record number: [id number]  Date of birth: [birth year]    ID: Christie Atkinson is a 59yo woman with PMH of gestational DM, osteoarthritis, fibromyalgia who presented  With abdominal RUQ pain , nausea, vomiting and diarrhea.   The pain was 10/10 but now improved with pain medication. on ultrasound, she was found to have an obstructing gallstone. She was also found to have new onset Afib with RVR.  She was started on a diltiazem drip then switched to oral diltiazem for rate control of afib. She is being followed by cardiology and general surgery.POD#2 s/p cholecystectomy, now in sinus rhythm  S:   Has ambulated in room without assistance. No chest pain or palpitations. She is feeling less distended but still has some abd discomfort.   Telemetry: NSR,  HR 60s   i have reviewed her meds, and OR notes/evaluation by general surgery and cardiology  BP 113/54 mmHg  Pulse 66  Temp(Src) 98.2 F (36.8 C) (Oral)  Resp 18  Ht 5' 6.5" (1.689 m)  Wt 260 lb 5.8 oz (118.1 kg)  BMI 41.40 kg/m2  SpO2 94% Physical Exam  Constitutional:  oriented to person, place, and time. appears well-developed and well-nourished. No distress.  HENT: Christie Atkinson, PERRLA, no scleral icterus Mouth/Throat: Oropharynx is clear and moist. No oropharyngeal exudate.  Cardiovascular: Normal rate, regular rhythm and normal heart sounds. Exam reveals no gallop and no friction rub.  No murmur heard.  Pulmonary/Chest: Effort normal and breath sounds normal. No respiratory distress.  has no wheezes.  Abdominal: Soft. Protuberant. 3 incisions, bandaged/glued Skin: Skin is warm and dry. No rash noted. No erythema.  Psychiatric: a normal mood and affect.  behavior is normal.   Labs: CBC    Component Value Date/Time   WBC 11.1* 02/25/2015 0054   RBC 3.92 02/25/2015 0054   HGB 12.0 02/25/2015 0054   HCT 36.8 02/25/2015 0054   PLT 217 02/25/2015 0054   MCV 93.9 02/25/2015 0054   MCH 30.6 02/25/2015 0054   MCHC 32.6 02/25/2015 0054   RDW 13.3 02/25/2015 0054   LYMPHSABS 1.8 02/23/2015 0619   MONOABS 1.2* 02/23/2015 0619   EOSABS 0.0 02/23/2015 0619   BASOSABS 0.0 02/23/2015 0619    BMET    Component Value Date/Time   NA 133* 02/26/2015 1158   K 4.3 02/26/2015 1158   CL 102 02/26/2015 1158   CO2 26 02/26/2015 1158   GLUCOSE 118* 02/26/2015 1158   BUN 7 02/26/2015 1158   CREATININE 0.91 02/26/2015 1158   CALCIUM 8.6* 02/26/2015 1158   GFRNONAA >60 02/26/2015 1158   GFRAA >60 02/26/2015 1158    Assessment and Plan: 59yo F with cholelithiasis presented with sudden onset RUQ likely for obstructive gallstone, pain causing secondary afib with RVR POD#2s/p cholecystectomy and now resolution of arrhythmia   I have seen and evaluated Christie Atkinson and discussed their care with the Residency Team.  - cholelithiasis =  has had definitive treatment with cholecystectomy. Abdominal pain is no longer RUQ but rather from discomfort from insuflation. Continue to use pain meds prn. Follow surgery recs for bathing, and no heavy lifting x 3 wk  - afib = appears that she is going back into sinus rhythm. No need for dilt at this time since HR 60 and sBP 110s. Will only send out on ASA and have her pcp check BP to see if needs add back diltiazem. Appreciate cardiology recs. Anticipate to follow up with  cardiology as o/p.  -constipation =passing flatus, and ambulating. Anticipate improvement with stool softener and returning back to baseline in 1-2 wk   dispo anticipate discharge today    [name], MD 02/26/2015, 8:15 PM

## 2015-02-26 NOTE — Progress Notes (Signed)
Subjective: Patient had many complaints about her sleep being disrupted and towards staff but was physically quite well. Passed a formed bowel movement overnight. Some itching overnight she attributes to her gown, improved with benadryl. She anticipates heading home today with her daughter.  Objective: Vital signs in last 24 hours: Filed Vitals:   02/25/15 0615 02/25/15 1410 02/25/15 2139 02/26/15 0552  BP: 109/64 169/100 169/57 113/54  Pulse:  68 79 66  Temp:  99.1 F (37.3 C) 99.8 F (37.7 C) 98.2 F (36.8 C)  TempSrc:  Oral Oral Oral  Resp:  16 18 18   Height:      Weight:      SpO2:  96% 94% 94%   Weight change:   Intake/Output Summary (Last 24 hours) at 02/26/15 1342 Last data filed at 02/26/15 1100  Gross per 24 hour  Intake 862.83 ml  Output   2500 ml  Net -1637.17 ml   GENERAL- alert, co-operative, obese, NAD HEENT- oral mucosa appears moist CARDIAC- RRR, no murmurs, rubs or gallops. RESP- CTAB, good air movement ABDOMEN- Soft, minimal TTP, no ecchymoses, dressing on laparoscopic incisions all clean/dry/intact, decreased abdominal distension BACK- Normal curvature, no paraspinal tenderness, no CVA tenderness. EXTREMITIES- pulse 2+, symmetric, no pedal edema. SKIN- Warm, dry, No rash or lesion. PSYCH- appropriate mood and affect  Lab Results: Basic Metabolic Panel:  Recent Labs Lab 02/25/15 0054 02/26/15 1158  NA 138 133*  K 4.4 4.3  CL 108 102  CO2 25 26  GLUCOSE 118* 118*  BUN 7 7  CREATININE 1.14* 0.91  CALCIUM 8.2* 8.6*   Liver Function Tests:  Recent Labs Lab 02/22/15 1215 02/23/15 0619  AST 30 32  ALT 25 25  ALKPHOS 58 57  BILITOT 0.7 0.9  PROT 8.5* 7.5  ALBUMIN 4.2 3.5    Recent Labs Lab 02/22/15 1215  LIPASE 26   No results for input(s): AMMONIA in the last 168 hours. CBC:  Recent Labs Lab 02/23/15 0619 02/24/15 0310 02/25/15 0054  WBC 12.0* 9.3 11.1*  NEUTROABS 9.0*  --   --   HGB 14.0 14.8 12.0  HCT 41.7 43.2 36.8   MCV 91.9 93.1 93.9  PLT 192 216 217   Cardiac Enzymes: No results for input(s): CKTOTAL, CKMB, CKMBINDEX, TROPONINI in the last 168 hours. BNP: No results for input(s): PROBNP in the last 168 hours. D-Dimer: No results for input(s): DDIMER in the last 168 hours. CBG: No results for input(s): GLUCAP in the last 168 hours. Hemoglobin A1C: No results for input(s): HGBA1C in the last 168 hours. Fasting Lipid Panel: No results for input(s): CHOL, HDL, LDLCALC, TRIG, CHOLHDL, LDLDIRECT in the last 168 hours. Thyroid Function Tests:  Recent Labs Lab 02/22/15 1954  TSH 1.260   Coagulation: No results for input(s): LABPROT, INR in the last 168 hours. Anemia Panel: No results for input(s): VITAMINB12, FOLATE, FERRITIN, TIBC, IRON, RETICCTPCT in the last 168 hours. Urine Drug Screen: Drugs of Abuse  No results found for: LABOPIA, COCAINSCRNUR, LABBENZ, AMPHETMU, THCU, LABBARB  Alcohol Level: No results for input(s): ETH in the last 168 hours. Urinalysis:  Recent Labs Lab 02/22/15 1159  COLORURINE YELLOW  LABSPEC 1.018  PHURINE 6.5  GLUCOSEU NEGATIVE  HGBUR NEGATIVE  BILIRUBINUR NEGATIVE  KETONESUR 15*  PROTEINUR NEGATIVE  UROBILINOGEN 0.2  NITRITE NEGATIVE  LEUKOCYTESUR NEGATIVE    Micro Results: Recent Results (from the past 240 hour(s))  MRSA PCR Screening     Status: None   Collection Time: 02/22/15  6:45 PM  Result Value Ref Range Status   MRSA by PCR NEGATIVE NEGATIVE Final    Comment:        The GeneXpert MRSA Assay (FDA approved for NASAL specimens only), is one component of a comprehensive MRSA colonization surveillance program. It is not intended to diagnose MRSA infection nor to guide or monitor treatment for MRSA infections.    Studies/Results: No results found. Medications: I have reviewed the patient's current medications. Scheduled Meds: . enoxaparin (LOVENOX) injection  40 mg Subcutaneous Q24H  . sodium chloride  3 mL Intravenous Q12H    Continuous Infusions: . sodium chloride Stopped (02/26/15 0700)   PRN Meds:.diphenhydrAMINE, docusate sodium, ondansetron **OR** ondansetron (ZOFRAN) IV, oxyCODONE-acetaminophen Assessment/Plan: Symptomatic cholelithiasis Well healing incisions and well controlled pain. No evidence of obstruction or significant ileus. Walked well up and down the hallway with PT. - Zofran 4 mg every 6 hours when necessary - Percocet 5mg  q4hrs PRN  A. fib with RVR Apparently resolved. Heart rate in NSR normal to brady during evaluation this morning. Will hold medications and defer to outpatient F/U / outpatient cardiology - ASA 81mg   Pruritis Complaint of moderate pruritis, thinks it is due to her gown. - Benadryl 25mg  PO q6hrs PRN  Constipation Patient on low PO intake plus fairly high doses of morphine for pain control. Bowel movement improved today. - Docusate 50mg  PO  Diet: Regular DVT ppx: On heparin drip FULL CODE  Dispo: Discharge to home later today.  The patient does not have a current PCP (No primary care provider on file.) and does not know need an [facility] hospital follow-up appointment after discharge.  The patient does not know have transportation limitations that hinder transportation to clinic appointments.    LOS: 4 days   Collier Salina, MD 02/26/2015, 1:42 PM

## 2015-02-26 NOTE — Discharge Instructions (Signed)
[facility] ______[facility] °LAPAROSCOPIC SURGERY: POST OP INSTRUCTIONS °Always review your discharge instruction sheet given to you by the facility where your surgery was performed. °IF YOU HAVE DISABILITY OR FAMILY LEAVE FORMS, YOU MUST BRING THEM TO THE OFFICE FOR PROCESSING.   °DO NOT GIVE THEM TO YOUR DOCTOR. ° °1. A prescription for pain medication may be given to you upon discharge.  Take your pain medication as prescribed, if needed.  If narcotic pain medicine is not needed, then you may take acetaminophen (Tylenol) or ibuprofen (Advil) as needed. °2. Take your usually prescribed medications unless otherwise directed. °3. If you need a refill on your pain medication, please contact your pharmacy.  They will contact our office to request authorization. Prescriptions will not be filled after 5pm or on week-ends. °4. You should follow a light diet the first few days after arrival home, such as soup and crackers, etc.  Be sure to include lots of fluids daily. °5. Most patients will experience some swelling and bruising in the area of the incisions.  Ice packs will help.  Swelling and bruising can take several days to resolve.  °6. It is common to experience some constipation if taking pain medication after surgery.  Increasing fluid intake and taking a stool softener (such as Colace) will usually help or prevent this problem from occurring.  A mild laxative (Milk of Magnesia or Miralax) should be taken according to package instructions if there are no bowel movements after 48 hours. °7. Unless discharge instructions indicate otherwise, you may remove your bandages 24-48 hours after surgery, and you may shower at that time.  You may have steri-strips (small skin tapes) in place directly over the incision.  These strips should be left on the skin for 7-10 days.  If your surgeon used skin glue on the incision, you may shower in 24 hours.  The glue will flake off over the next 2-3 weeks.  Any sutures or  staples will be removed at the office during your follow-up visit. °8. ACTIVITIES:  You may resume regular (light) daily activities beginning the next day--such as daily self-care, walking, climbing stairs--gradually increasing activities as tolerated.  You may have sexual intercourse when it is comfortable.  Refrain from any heavy lifting or straining until approved by your doctor. °a. You may drive when you are no longer taking prescription pain medication, you can comfortably wear a seatbelt, and you can safely maneuver your car and apply brakes. °b. RETURN TO WORK:  __________________________________________________________ °9. You should see your doctor in the office for a follow-up appointment approximately 2-3 weeks after your surgery.  Make sure that you call for this appointment within a day or two after you arrive home to insure a convenient appointment time. °10. OTHER INSTRUCTIONS: __________________________________________________________________________________________________________________________ __________________________________________________________________________________________________________________________ °WHEN TO CALL YOUR DOCTOR: °1. Fever over 101.0 °2. Inability to urinate °3. Continued bleeding from incision. °4. Increased pain, redness, or drainage from the incision. °5. Increasing abdominal pain ° °The clinic staff is available to answer your questions during regular business hours.  Please don’t hesitate to call and ask to speak to one of the nurses for clinical concerns.  If you have a medical emergency, go to the nearest emergency room or call 911.  A surgeon from [facility] is always on call at the hospital. °[address], [address], [place], [place]  [address] ? [address] [address], [place],    [address] °[phone] ? [phone] ? FAX [phone] °Web site:   [email or web address]  - No heavy lifting >20lbs for 3 weeks.  - No submerging  wounds for 24-48 hours.  - Take aspirin 81mg  daily indefinitely unless instructed otherwise by your regular doctor.  - If you develop persistent heart palpitations that do not improve with rest or severe shortness of breath, do not wait for your scheduled appointment to seek medical help

## 2015-02-26 NOTE — Progress Notes (Signed)
Pt refusing all medications; Cardizem and potassium. Education given with pt continuing to refuse. Notified MD, Rice.

## 2015-02-26 NOTE — Telephone Encounter (Signed)
Patient called after being unable to fill written pain medication prescription as that dose is not stocked by her chosen pharmacy. Called pharmacy to instruct them to provide alternate prescription. Instructed them to require previous script and void/destroy this before filling this new prescription. Discussed with patient and pharmacy and all expressed understanding of plan.

## 2015-02-26 NOTE — Progress Notes (Signed)
[facility] Progress Note  2 Days Post-Op  Subjective: Pt doing well.  Less pain each day.  No N/V, tolerating HH diet.  Ambulating well OOB with therapy.  Urinating well, +flatus, no BM yet.  Objective: Vital signs in last 24 hours: Temp:  [98.2 F (36.8 C)-99.8 F (37.7 C)] 98.2 F (36.8 C) (09/21 0552) Pulse Rate:  [66-79] 66 (09/21 0552) Resp:  [16-18] 18 (09/21 0552) BP: (113-169)/(54-100) 113/54 mmHg (09/21 0552) SpO2:  [94 %-96 %] 94 % (09/21 0552) Last BM Date: 02/22/15  Intake/Output from previous day: 09/20 0701 - 09/21 0700 In: 862.8 [P.O.:462; I.V.:400.8] Out: 800 [Urine:800] Intake/Output this shift: Total I/O In: 240 [P.O.:240] Out: 1200 [Urine:1200]  PE: Gen:  Alert, NAD, pleasant Abd: Soft, minimal tenderness, mild distension, +BS, no HSM, incisions C/D/I   Lab Results:   Recent Labs  02/24/15 0310 02/25/15 0054  WBC 9.3 11.1*  HGB 14.8 12.0  HCT 43.2 36.8  PLT 216 217   BMET  Recent Labs  02/25/15 0054  NA 138  K 4.4  CL 108  CO2 25  GLUCOSE 118*  BUN 7  CREATININE 1.14*  CALCIUM 8.2*   PT/INR No results for input(s): LABPROT, INR in the last 72 hours. CMP     Component Value Date/Time   NA 138 02/25/2015 0054   K 4.4 02/25/2015 0054   CL 108 02/25/2015 0054   CO2 25 02/25/2015 0054   GLUCOSE 118* 02/25/2015 0054   BUN 7 02/25/2015 0054   CREATININE 1.14* 02/25/2015 0054   CALCIUM 8.2* 02/25/2015 0054   PROT 7.5 02/23/2015 0619   ALBUMIN 3.5 02/23/2015 0619   AST 32 02/23/2015 0619   ALT 25 02/23/2015 0619   ALKPHOS 57 02/23/2015 0619   BILITOT 0.9 02/23/2015 0619   GFRNONAA 51* 02/25/2015 0054   GFRAA 59* 02/25/2015 0054   Lipase     Component Value Date/Time   LIPASE 26 02/22/2015 1215       Studies/Results: No results found.  Anti-infectives: Anti-infectives    Start     Dose/Rate Route Frequency Ordered Stop   02/25/15 1400  cefTRIAXone (ROCEPHIN) 2 g in dextrose 5 % 50 mL IVPB     2  g 100 mL/hr over 30 Minutes Intravenous  Once 02/24/15 1651 02/25/15 1529   02/24/15 2000  cefTRIAXone (ROCEPHIN) 2 g in dextrose 5 % 50 mL IVPB  Status:  Discontinued     2 g 100 mL/hr over 30 Minutes Intravenous Every 24 hours 02/24/15 0816 02/24/15 1651   02/22/15 2100  cefTRIAXone (ROCEPHIN) 1 g in dextrose 5 % 50 mL IVPB  Status:  Discontinued     1 g 100 mL/hr over 30 Minutes Intravenous Every 24 hours 02/22/15 2025 02/24/15 0816       Assessment/Plan POD #2, s/p lap chole for acute cholecystitis -Tolerating HH diet -mobilize and pulm toilet -no further antibiotics needed -Oral pain meds -Discussed discharge instructions A fib -per primary and cards  Okay from our perspective to d/c home when medically stable.  Follow up arranged.   LOS: 4 days    Nat Christen 02/26/2015, 10:16 AM Pager: [phone]

## 2015-02-26 NOTE — Progress Notes (Signed)
Physical Therapy Treatment Patient Details Name: Christie Atkinson MRN: [id number] DOB: August 12, 1955 Today's Date: 02/26/2015    History of Present Illness Ms. Fluellen is a 59yo woman with PMH of gestational DM, osteoarthritis, fibromyalgia who presented With abdominal RUQ pain , nausea, vomiting and diarrhea.  The pain was 10/10 but now improved with pain medication. on ultrasound, she was found to have an obstructing gallstone. She was also found to have new onset Afib with RVR. She was started on a diltiazem drip then switched to oral diltiazem for rate control of afib. She is being followed by cardiology and general surgery.POD#1 s/p cholecystectomy    PT Comments    Ready for DC home from PT standpoint. SaO2 93% with amb on RA.  Follow Up Recommendations  No PT follow up;Supervision - Intermittent     Equipment Recommendations  None recommended by PT    Recommendations for Other Services       Precautions / Restrictions Precautions Precautions: Fall Precaution Comments: dizzy from benydrl Restrictions Weight Bearing Restrictions: No    Mobility  Bed Mobility Overal bed mobility: Modified Independent Bed Mobility: Rolling;Sidelying to Sit;Sit to Supine Rolling: Modified independent (Device/Increase time) Sidelying to sit: Modified independent (Device/Increase time);HOB elevated   Sit to supine: Modified independent (Device/Increase time);HOB elevated   General bed mobility comments: Incr time  Transfers Overall transfer level: Modified independent Equipment used: Rolling walker (2 wheeled)   Sit to Stand: Modified independent (Device/Increase time)            Ambulation/Gait Ambulation/Gait assistance: Modified independent (Device/Increase time) Ambulation Distance (Feet): 200 Feet Assistive device: Rolling walker (2 wheeled) Gait Pattern/deviations: Step-through pattern;Decreased stride length Gait velocity: decreased but typically for surgery Gait  velocity interpretation: Below normal speed for age/gender General Gait Details: SaO2 93% amb on RA.Slow, steady gait with walker   Stairs Stairs: Yes Stairs assistance: Min guard Stair Management: One rail Left Number of Stairs: 1    Wheelchair Mobility    Modified Rankin (Stroke Patients Only)       Balance     Sitting balance-Leahy Scale: Good       Standing balance-Leahy Scale: Fair                      Cognition Arousal/Alertness: Awake/alert (but sleepy from benydrl) Behavior During Therapy: WFL for tasks assessed/performed Overall Cognitive Status: Within Functional Limits for tasks assessed                      Exercises      General Comments        Pertinent Vitals/Pain Pain Assessment: Faces Faces Pain Scale: Hurts little more Pain Location: abdomen Pain Descriptors / Indicators: Operative site guarding Pain Intervention(s): Limited activity within patient's tolerance;Monitored during session    Home Living                      Prior Function            PT Goals (current goals can now be found in the care plan section) Acute Rehab PT Goals PT Goal Formulation: With patient Time For Goal Achievement: 03/04/15 Potential to Achieve Goals: Good Progress towards PT goals: Goals met/education completed, patient discharged from PT    Frequency  Min 3X/week    PT Plan Current plan remains appropriate    Co-evaluation             End of Session   Activity  Tolerance: Patient tolerated treatment well Patient left: in bed;with call bell/phone within reach     Time: 1112-1127 PT Time Calculation (min) (ACUTE ONLY): 15 min  Charges:  $Gait Training: 8-22 mins                    G Codes:      MAYCOCK,CARY March 04, 2015, 11:52 AM  Suanne Marker PT [phone]

## 2015-02-26 NOTE — Progress Notes (Signed)
RN encouraged pt to ambulate in hall with staff as per order. Pt refused at this time.

## 2015-02-26 NOTE — Discharge Summary (Signed)
Name: Christie Atkinson MRN: [id number] DOB: [birth year] 59 y.o. PCP: No primary care [name] on file.  Date of Admission: 02/22/2015 12:14 PM Date of Discharge: 02/26/2015 Attending Physician: [name], MD  Discharge Diagnosis: Active Problems:   Cholelithiasis   Preoperative cardiovascular examination   Atrial fibrillation   Elevated blood pressure reading without diagnosis of hypertension   Abdominal pain   Calculus of gallbladder with biliary obstruction but without cholecystitis   Symptomatic cholelithiasis  Discharge Medications:   Medication List    TAKE these medications        aspirin 81 MG chewable tablet  Commonly known as:  ASPIRIN CHILDRENS  Chew 1 tablet (81 mg total) by mouth daily.     oxycodone-acetaminophen 2.5-325 MG per tablet  Commonly known as:  PERCOCET  Take 1 tablet by mouth every 4 (four) hours as needed for pain.        Disposition and follow-up:   Ms.Christie Atkinson was discharged from [facility] in Good condition.  At the hospital follow up visit please address:  1. Laparoscopic cholecystectomy: Surgery 3/87 without complication. Patient ambulatory at discharge. Given short course 5mg  Percocet PRN for pain control, no antibiotics indicated. Check site and analgesic requirement as appropriate.  2. Atrial fibrillation: New onset of atrial fibrillation with rapid ventricular response during hospital course. This resolved after cholecystectomy. Patient discharged on ASA 81mg  daily. Follow up with vitals check and EKG at office. She may need to be placed on home PO diltiazem/other rate controller if tachycardic with recurrence of Afib.  3. Labs / imaging needed at time of follow-up: EKG   Follow-up Appointments: Follow-up Information    Follow up with [name] On 03/11/2015.   Specialty:  General Surgery   Why:  Doc of the [facility], 3:45pm, arrive no later than 3:15pm for paperwork   Contact  information:   [place] [address] [phone]       [facility] Go on 03/04/2015.   Why:  @ 2:15pm for hospital follow up   Contact information:   [name], [name] [address] [phone]      Discharge Instructions: Discharge Instructions    Diet - low sodium heart healthy    Complete by:  As directed      Increase activity slowly    Complete by:  As directed            Consultations: Treatment Team:  Jerline Pain, MD  Procedures Performed:  Dg Abd 1 View  02/22/2015   CLINICAL DATA:  Abdominal pain. Pt c/o generalized abdominal pain, nausea, of vomiting of yellow substance x today. Hx appendectomy, hysterectomy and liver biopsy.  EXAM: ABDOMEN - 1 VIEW  COMPARISON:  CT 10/20/2010  FINDINGS: Small bowel decompressed. Moderate proximal colonic fecal material, decompressed distally. Surgical clips in the left pelvis.  No abnormal abdominal calcifications.  Mild degenerative changes in the lower lumbar spine.  IMPRESSION: 1. Nonobstructive bowel gas pattern.   Electronically Signed   By: Lucrezia Europe M.D.   On: 02/22/2015 15:08   US Abdomen Complete  02/22/2015   CLINICAL DATA:  Pain right upper quadrant radiating to left upper quadrant right flank since 3 a.m. with nausea and vomiting  EXAM: ULTRASOUND ABDOMEN COMPLETE  COMPARISON:  None.  FINDINGS: Gallbladder: 18 mm fell lodged in the neck of the gallbladder. Gallbladder wall is not thickened and there is  no Murphy's sign.  Common bile duct: Diameter: 5 mm  Liver: Fatty infiltration  IVC: No abnormality visualized.  Pancreas: Visualized portion unremarkable.  Spleen: Size and appearance within normal limits.  Right Kidney: Length: 13.6 cm. Echogenicity within normal limits. No mass or hydronephrosis visualized.  Left Kidney: Length: 13.3 cm. Echogenicity within normal limits. No mass or hydronephrosis visualized.  Abdominal aorta: No aneurysm visualized.  Other findings:  None.  IMPRESSION: Cholelithiasis with nonmobile stone lodged in the neck of the gallbladder.   Electronically Signed   By: Skipper Cliche M.D.   On: 02/22/2015 15:15    2D Echo: LV EF: 55% -  60%  ------------------------------------------------------------------- Indications:   Atrial fibrillation - 427.31.  ------------------------------------------------------------------- History:  PMH: Elevated Blood Pressure. Atrial fibrillation.  ------------------------------------------------------------------- Study Conclusions  - Left ventricle: The cavity size was normal. Wall thickness was increased in a pattern of mild LVH. Systolic function was normal. The estimated ejection fraction was in the range of 55% to 60%. Wall motion was normal; there were no regional wall motion abnormalities. - Left atrium: The atrium was moderately to severely dilated.  Impressions:  - Normal LV systolic function; mild LVH; moderate to severe LAE; trace MR and TR.  Transthoracic echocardiography. M-mode, complete 2D, spectral Doppler, and color Doppler. Birthdate: Patient birthdate: [birth year]. Age: Patient is 59 yr old. Sex: Gender: female. BMI: 40.7 kg/m^2. Blood pressure:   101/66 Patient status: Inpatient. Study date: Study date: 02/23/2015. Study time: 03:09 PM. Location: Bedside.  -------------------------------------------------------------------  ------------------------------------------------------------------- Left ventricle: The cavity size was normal. Wall thickness was increased in a pattern of mild LVH. Systolic function was normal. The estimated ejection fraction was in the range of 55% to 60%. Wall motion was normal; there were no regional wall motion abnormalities. The study was not technically sufficient to allow evaluation of LV diastolic dysfunction due to atrial  fibrillation.  ------------------------------------------------------------------- Aortic valve:  Trileaflet; normal thickness leaflets. Mobility was not restricted. Doppler: Transvalvular velocity was within the normal range. There was no stenosis. There was no regurgitation.  ------------------------------------------------------------------- Aorta: Aortic root: The aortic root was normal in size.  ------------------------------------------------------------------- Mitral valve:  Structurally normal valve.  Mobility was not restricted. Doppler: Transvalvular velocity was within the normal range. There was no evidence for stenosis. There was trivial regurgitation.  Peak gradient (D): 5 mm Hg.  ------------------------------------------------------------------- Left atrium: The atrium was moderately to severely dilated.  ------------------------------------------------------------------- Right ventricle: The cavity size was normal. Systolic function was normal.  ------------------------------------------------------------------- Pulmonic valve:  Doppler: Transvalvular velocity was within the normal range. There was no evidence for stenosis. There was trivial regurgitation.  ------------------------------------------------------------------- Tricuspid valve:  Structurally normal valve.  Doppler: Transvalvular velocity was within the normal range. There was trivial regurgitation.  ------------------------------------------------------------------- Right atrium: The atrium was normal in size.  ------------------------------------------------------------------- Pericardium: There was no pericardial effusion.  ------------------------------------------------------------------- Systemic veins: Inferior vena cava: The vessel was normal in size.  ------------------------------------------------------------------- Measurements  Left ventricle             Value    Reference LV ID, ED, PLAX chordal    (L)  35.1 mm   43 - 52 LV ID, ES, PLAX chordal        24.7 mm   23 - 38 LV fx shortening, PLAX chordal    30  %   >=29 LV PW thickness, ED          13.4 mm   --------- IVS/LV PW ratio, ED  1.07     <=1.3 LV e&', lateral            10.3 cm/s  --------- LV E/e&', lateral           10.39    --------- LV e&', medial             7.94 cm/s  --------- LV E/e&', medial            13.48    --------- LV e&', average            9.12 cm/s  --------- LV E/e&', average           11.73    --------- Longitudinal strain, TDI       15  %   ---------  Ventricular septum          Value    Reference IVS thickness, ED           14.3 mm   ---------  LVOT                 Value    Reference LVOT ID, S              20  mm   --------- LVOT area               3.14 cm^2  ---------  Aorta                 Value    Reference Aortic root ID, ED          32  mm   ---------  Left atrium              Value    Reference LA ID, A-P, ES            43  mm   --------- LA ID/bsa, A-P            1.78 cm/m^2 <=2.2 LA volume, S             89.3 ml   --------- LA volume/bsa, S           36.9 ml/m^2 --------- LA volume, ES, 1-p A4C        108  ml   --------- LA volume/bsa, ES, 1-p A4C      44.7 ml/m^2 --------- LA volume, ES, 1-p A2C        66.7 ml   --------- LA volume/bsa, ES, 1-p A2C      27.6 ml/m^2 ---------  Mitral valve             Value    Reference Mitral E-wave peak velocity      107  cm/s  --------- Mitral deceleration  time    (H)  236  ms   150 - 230 Mitral peak gradient, D        5   mm Hg ---------  Systemic veins            Value    Reference Estimated CVP             3   mm Hg ---------  Right ventricle            Value    Reference TAPSE                 22.1 mm   ---------  Legend: (L) and (H) mark values outside specified reference range.  ------------------------------------------------------------------- Prepared and Electronically Authenticated by  Kirk Ruths 2016-09-19T07:23:11  Admission HPI: 59 year old woman with history of gestational diabetes, total hysterectomy, appendectomy, osteoarthritis, fibromyalgia present to the emergency department with right upper quadrant abdominal pain, nausea, vomiting, and diarrhea. She was generally feeling well yesterday although did note a single episode of loose foul-smelling stool. She states that she woke up around 4 AM this morning to a malodorous smell and right upper quadrant pain. She felt significant abdominal pressure and attempted but was unable to pass a bowel movement. Pain was severe and accompanied by sweating. It is partially relieved by leaning forward. Since that time she has multiple episodes of vomiting mostly of yellow color and gastric contents. Denies any fevers chills or generalized pain except in her right upper quadrant and right flank. At the emergency department right upper quadrant ultrasound demonstrated an obstructing 18 mm gallstone in the biliary duct. She was then noted to go into A. fib with RVR with a pulse maximum of 153. Pulse improved with pain control with IV morphine and Dilaudid. Also Toradol and intravenous metoprolol administered with incomplete resolution. Heart rate of 110 and hemodynamically stable patient was started on diltiazem drip.  Hospital Course by problem list: Active Problems:   Cholelithiasis   Preoperative  cardiovascular examination   Atrial fibrillation   Elevated blood pressure reading without diagnosis of hypertension   Abdominal pain   Calculus of gallbladder with biliary obstruction but without cholecystitis   Symptomatic cholelithiasis   Symptomatic cholelithiasis Patient admitted with significant RUQ pain, nausea, and vomiting. In ED ultrasound noted a 84mm stone in the gallbladder neck. She was placed on ceftriaxone and observed for 1 day, with worsening leukocytosis and no obvious improvement or worsening. She underwent laparoscopic cholecystectomy on 9/19 without any signs of complication. She was observed until the morning of 9/21 in order to ensure her PO intake, ambulation, and family at home would be appropriate for discharge.  Atrial fibrillation She was noted to be in Afib with RVR at an elevated rate most likely secondary to her acute pain. No history of previous Afib. Patient chads-vasc2 score was 1 and very low risk for embolic event in the acute setting. She was placed on cardizem drip for rate control and underwent surgery. Postoperatively she was back in NSR, medication changed to 180mg  ER PO dilt and remained stable. Medication discontinued prior to discharge with no evidence of tachycardia, hypertension, and seemed to be returned to baseline. Discharged on ASA 81mg  daily due to postoperative and paroxysmal Afib.  Discharge Vitals:   BP 113/54 mmHg  Pulse 66  Temp(Src) 98.2 F (36.8 C) (Oral)  Resp 18  Ht 5' 6.5" (1.689 m)  Wt 118.1 kg (260 lb 5.8 oz)  BMI 41.40 kg/m2  SpO2 94%  Discharge Labs:  Results for orders placed or performed during the hospital encounter of 02/22/15 (from the past 24 hour(s))  Basic metabolic panel     Status: Abnormal   Collection Time: 02/26/15 11:58 AM  Result Value Ref Range   Sodium 133 (L) 135 - 145 mmol/L   Potassium 4.3 3.5 - 5.1 mmol/L   Chloride 102 101 - 111 mmol/L   CO2 26 22 - 32 mmol/L   Glucose, Bld 118 (H) 65 - 99 mg/dL    BUN 7 6 - 20 mg/dL   Creatinine, Ser 0.91 0.44 - 1.00 mg/dL   Calcium 8.6 (L) 8.9 - 10.3 mg/dL   GFR calc non Af Amer >60 >60 mL/min   GFR calc Af Amer >60 >60 mL/min   Anion  gap 5 5 - 15    Signed: Collier Salina, MD 02/26/2015, 2:00 PM

## 2015-02-26 NOTE — Progress Notes (Signed)
Christie Atkinson discharged Home with family per MD order.  Discharge instructions reviewed and discussed with the patient, all questions and concerns answered. Copy of instructions, care note for new medication & diagnosis and scripts given to patient.    Medication List    TAKE these medications        aspirin 81 MG chewable tablet  Commonly known as:  ASPIRIN CHILDRENS  Chew 1 tablet (81 mg total) by mouth daily.     oxycodone-acetaminophen 2.5-325 MG per tablet  Commonly known as:  PERCOCET  Take 1 tablet by mouth every 4 (four) hours as needed for pain.        Patients skin is clean, dry and intact, no evidence of skin break down, incisions to abdomen CDI with steri strips & gauge in place. IV site discontinued and catheter remains intact. Site without signs and symptoms of complications. Dressing and pressure applied.  Patient escorted to car by NT in a wheelchair,  no distress noted upon discharge.  Wynetta Emery, Delcine C 02/26/2015 3:01 PM

## 2015-06-10 DIAGNOSIS — R2689 Other abnormalities of gait and mobility: Secondary | ICD-10-CM | POA: Diagnosis not present

## 2015-06-10 DIAGNOSIS — M170 Bilateral primary osteoarthritis of knee: Secondary | ICD-10-CM | POA: Diagnosis not present

## 2015-06-10 DIAGNOSIS — M1712 Unilateral primary osteoarthritis, left knee: Secondary | ICD-10-CM | POA: Diagnosis not present

## 2015-06-10 DIAGNOSIS — M25562 Pain in left knee: Secondary | ICD-10-CM | POA: Diagnosis not present

## 2015-06-10 DIAGNOSIS — M25561 Pain in right knee: Secondary | ICD-10-CM | POA: Diagnosis not present

## 2015-06-13 DIAGNOSIS — M25561 Pain in right knee: Secondary | ICD-10-CM | POA: Diagnosis not present

## 2015-06-13 DIAGNOSIS — M170 Bilateral primary osteoarthritis of knee: Secondary | ICD-10-CM | POA: Diagnosis not present

## 2015-06-13 DIAGNOSIS — M25562 Pain in left knee: Secondary | ICD-10-CM | POA: Diagnosis not present

## 2015-06-13 DIAGNOSIS — R2689 Other abnormalities of gait and mobility: Secondary | ICD-10-CM | POA: Diagnosis not present

## 2015-06-13 DIAGNOSIS — M1711 Unilateral primary osteoarthritis, right knee: Secondary | ICD-10-CM | POA: Diagnosis not present

## 2015-06-18 DIAGNOSIS — M25562 Pain in left knee: Secondary | ICD-10-CM | POA: Diagnosis not present

## 2015-06-18 DIAGNOSIS — M1712 Unilateral primary osteoarthritis, left knee: Secondary | ICD-10-CM | POA: Diagnosis not present

## 2015-06-18 DIAGNOSIS — M170 Bilateral primary osteoarthritis of knee: Secondary | ICD-10-CM | POA: Diagnosis not present

## 2015-06-18 DIAGNOSIS — M25561 Pain in right knee: Secondary | ICD-10-CM | POA: Diagnosis not present

## 2015-06-18 DIAGNOSIS — R2689 Other abnormalities of gait and mobility: Secondary | ICD-10-CM | POA: Diagnosis not present

## 2015-06-27 DIAGNOSIS — M25562 Pain in left knee: Secondary | ICD-10-CM | POA: Diagnosis not present

## 2015-06-27 DIAGNOSIS — M25561 Pain in right knee: Secondary | ICD-10-CM | POA: Diagnosis not present

## 2015-06-27 DIAGNOSIS — M170 Bilateral primary osteoarthritis of knee: Secondary | ICD-10-CM | POA: Diagnosis not present

## 2015-07-23 DIAGNOSIS — R2689 Other abnormalities of gait and mobility: Secondary | ICD-10-CM | POA: Diagnosis not present

## 2015-07-23 DIAGNOSIS — M25562 Pain in left knee: Secondary | ICD-10-CM | POA: Diagnosis not present

## 2015-07-23 DIAGNOSIS — M25561 Pain in right knee: Secondary | ICD-10-CM | POA: Diagnosis not present

## 2015-07-23 DIAGNOSIS — M170 Bilateral primary osteoarthritis of knee: Secondary | ICD-10-CM | POA: Diagnosis not present

## 2015-10-21 DIAGNOSIS — M25561 Pain in right knee: Secondary | ICD-10-CM | POA: Diagnosis not present

## 2015-10-21 DIAGNOSIS — M170 Bilateral primary osteoarthritis of knee: Secondary | ICD-10-CM | POA: Diagnosis not present

## 2015-10-21 DIAGNOSIS — R262 Difficulty in walking, not elsewhere classified: Secondary | ICD-10-CM | POA: Diagnosis not present

## 2015-10-21 DIAGNOSIS — M25562 Pain in left knee: Secondary | ICD-10-CM | POA: Diagnosis not present

## 2015-10-27 DIAGNOSIS — M1711 Unilateral primary osteoarthritis, right knee: Secondary | ICD-10-CM | POA: Diagnosis not present

## 2015-10-27 DIAGNOSIS — M25561 Pain in right knee: Secondary | ICD-10-CM | POA: Diagnosis not present

## 2015-10-28 DIAGNOSIS — M1712 Unilateral primary osteoarthritis, left knee: Secondary | ICD-10-CM | POA: Diagnosis not present

## 2015-10-28 DIAGNOSIS — M25562 Pain in left knee: Secondary | ICD-10-CM | POA: Diagnosis not present

## 2015-11-04 DIAGNOSIS — M25561 Pain in right knee: Secondary | ICD-10-CM | POA: Diagnosis not present

## 2015-11-04 DIAGNOSIS — M1711 Unilateral primary osteoarthritis, right knee: Secondary | ICD-10-CM | POA: Diagnosis not present

## 2015-11-06 DIAGNOSIS — M1712 Unilateral primary osteoarthritis, left knee: Secondary | ICD-10-CM | POA: Diagnosis not present

## 2015-11-06 DIAGNOSIS — M25562 Pain in left knee: Secondary | ICD-10-CM | POA: Diagnosis not present

## 2015-11-10 DIAGNOSIS — M25561 Pain in right knee: Secondary | ICD-10-CM | POA: Diagnosis not present

## 2015-11-10 DIAGNOSIS — M1711 Unilateral primary osteoarthritis, right knee: Secondary | ICD-10-CM | POA: Diagnosis not present

## 2015-11-11 DIAGNOSIS — M1712 Unilateral primary osteoarthritis, left knee: Secondary | ICD-10-CM | POA: Diagnosis not present

## 2015-11-11 DIAGNOSIS — M25562 Pain in left knee: Secondary | ICD-10-CM | POA: Diagnosis not present

## 2016-08-09 DIAGNOSIS — M170 Bilateral primary osteoarthritis of knee: Secondary | ICD-10-CM | POA: Diagnosis not present

## 2016-08-09 DIAGNOSIS — M25562 Pain in left knee: Secondary | ICD-10-CM | POA: Diagnosis not present

## 2016-08-09 DIAGNOSIS — M25561 Pain in right knee: Secondary | ICD-10-CM | POA: Diagnosis not present

## 2016-08-17 DIAGNOSIS — M25562 Pain in left knee: Secondary | ICD-10-CM | POA: Diagnosis not present

## 2016-08-17 DIAGNOSIS — M25561 Pain in right knee: Secondary | ICD-10-CM | POA: Diagnosis not present

## 2016-08-17 DIAGNOSIS — M170 Bilateral primary osteoarthritis of knee: Secondary | ICD-10-CM | POA: Diagnosis not present

## 2016-08-23 DIAGNOSIS — M25561 Pain in right knee: Secondary | ICD-10-CM | POA: Diagnosis not present

## 2016-08-23 DIAGNOSIS — M1711 Unilateral primary osteoarthritis, right knee: Secondary | ICD-10-CM | POA: Diagnosis not present

## 2016-08-24 DIAGNOSIS — M1712 Unilateral primary osteoarthritis, left knee: Secondary | ICD-10-CM | POA: Diagnosis not present

## 2016-08-24 DIAGNOSIS — M25562 Pain in left knee: Secondary | ICD-10-CM | POA: Diagnosis not present

## 2016-08-30 DIAGNOSIS — M170 Bilateral primary osteoarthritis of knee: Secondary | ICD-10-CM | POA: Diagnosis not present

## 2016-08-30 DIAGNOSIS — M25561 Pain in right knee: Secondary | ICD-10-CM | POA: Diagnosis not present

## 2016-08-30 DIAGNOSIS — M25562 Pain in left knee: Secondary | ICD-10-CM | POA: Diagnosis not present

## 2017-03-23 DIAGNOSIS — M25561 Pain in right knee: Secondary | ICD-10-CM | POA: Diagnosis not present

## 2017-03-23 DIAGNOSIS — M170 Bilateral primary osteoarthritis of knee: Secondary | ICD-10-CM | POA: Diagnosis not present

## 2017-03-23 DIAGNOSIS — M25562 Pain in left knee: Secondary | ICD-10-CM | POA: Diagnosis not present

## 2017-03-29 DIAGNOSIS — M25562 Pain in left knee: Secondary | ICD-10-CM | POA: Diagnosis not present

## 2017-03-29 DIAGNOSIS — M1712 Unilateral primary osteoarthritis, left knee: Secondary | ICD-10-CM | POA: Diagnosis not present

## 2017-03-30 DIAGNOSIS — M25561 Pain in right knee: Secondary | ICD-10-CM | POA: Diagnosis not present

## 2017-03-30 DIAGNOSIS — M1711 Unilateral primary osteoarthritis, right knee: Secondary | ICD-10-CM | POA: Diagnosis not present

## 2017-04-05 DIAGNOSIS — M25561 Pain in right knee: Secondary | ICD-10-CM | POA: Diagnosis not present

## 2017-04-05 DIAGNOSIS — M25562 Pain in left knee: Secondary | ICD-10-CM | POA: Diagnosis not present

## 2017-04-05 DIAGNOSIS — M170 Bilateral primary osteoarthritis of knee: Secondary | ICD-10-CM | POA: Diagnosis not present

## 2017-06-23 ENCOUNTER — Telehealth: Payer: Self-pay | Admitting: Interventional Cardiology

## 2017-06-23 NOTE — Telephone Encounter (Signed)
New message     Pt c/o Shortness Of Breath: STAT if SOB developed within the last 24 hours or pt is noticeably SOB on the phone  1. Are you currently SOB (can you hear that pt is SOB on the phone)? no  2. How long have you been experiencing SOB? Allergy asthma attack the week of Christmas   3. Are you SOB when sitting or when up moving around? both  4. Are you currently experiencing any other symptoms?  She said when she lays down she struggles to breathe, feels like all her oxygen is being cut off, she is also having problems eating,  She says she is having a lot of bloating and belching.

## 2017-06-23 NOTE — Telephone Encounter (Signed)
Spoke with pt and she states she has been having SOB when lying down, belching, bloating and occasional pain in arm that comes and goes.  Not currently having any of those sx.  Pt states she had an asthma attack the week of Christmas and has had trouble since.  Pt has hx of Afib in 2016 and states that she does feels fluttering and palps off and on.  Scheduled pt to come in and see Ellen Henri, PA-C tomorrow. Pt verbalized understanding and was in agreement with this plan.

## 2017-06-24 ENCOUNTER — Encounter: Payer: Self-pay | Admitting: Cardiology

## 2017-06-24 ENCOUNTER — Ambulatory Visit (INDEPENDENT_AMBULATORY_CARE_PROVIDER_SITE_OTHER): Payer: Medicare Other | Admitting: Cardiology

## 2017-06-24 VITALS — BP 150/100 | HR 84 | Ht 66.0 in | Wt 264.8 lb

## 2017-06-24 DIAGNOSIS — R0600 Dyspnea, unspecified: Secondary | ICD-10-CM

## 2017-06-24 DIAGNOSIS — I4891 Unspecified atrial fibrillation: Secondary | ICD-10-CM

## 2017-06-24 LAB — CBC
HEMATOCRIT: 39.1 % (ref 34.0–46.6)
HEMOGLOBIN: 13.4 g/dL (ref 11.1–15.9)
MCH: 31.1 pg (ref 26.6–33.0)
MCHC: 34.3 g/dL (ref 31.5–35.7)
MCV: 91 fL (ref 79–97)
Platelets: 247 10*3/uL (ref 150–379)
RBC: 4.31 x10E6/uL (ref 3.77–5.28)
RDW: 12.9 % (ref 12.3–15.4)
WBC: 9.6 10*3/uL (ref 3.4–10.8)

## 2017-06-24 LAB — BASIC METABOLIC PANEL
BUN/Creatinine Ratio: 14 (ref 12–28)
BUN: 14 mg/dL (ref 8–27)
CO2: 19 mmol/L — AB (ref 20–29)
CREATININE: 1.01 mg/dL — AB (ref 0.57–1.00)
Calcium: 9.3 mg/dL (ref 8.7–10.3)
Chloride: 99 mmol/L (ref 96–106)
GFR calc Af Amer: 69 mL/min/{1.73_m2} (ref >59)
GFR, EST NON AFRICAN AMERICAN: 60 mL/min/{1.73_m2} (ref >59)
GLUCOSE: 131 mg/dL — AB (ref 65–99)
Potassium: 4.2 mmol/L (ref 3.5–5.2)
SODIUM: 135 mmol/L (ref 134–144)

## 2017-06-24 LAB — TSH: TSH: 4.41 u[IU]/mL (ref 0.450–4.500)

## 2017-06-24 LAB — PRO B NATRIURETIC PEPTIDE: NT-PRO BNP: 3404 pg/mL — AB (ref 0–287)

## 2017-06-24 MED ORDER — DILTIAZEM HCL ER COATED BEADS 120 MG PO CP24: 120.0000 mg | ORAL_CAPSULE | Freq: Every day | ORAL | 3 refills | Status: DC

## 2017-06-24 MED ORDER — APIXABAN 5 MG PO TABS: 5.0000 mg | ORAL_TABLET | Freq: Two times a day (BID) | ORAL | 3 refills | Status: DC

## 2017-06-24 NOTE — Patient Instructions (Addendum)
Medication Instructions:  1. START CARDIZEM 120 MG DAILY 2. START ELIQUIS 5 MG 1 TABLET TWICE DAILY 3. STOP ASPIRIN  Labwork: 1. TODAY BMET, PRO BNP, CBC, TSH  Testing/Procedures: NONE ORDERED TODAY  Follow-Up: 1. [employer] ON 07/14/17 @ 3 PM   Any Other Special Instructions Will Be Listed Below (If Applicable). AVOID CAFFEINE AVOID IBUPROFEN  CALL IF YOU HAVE ANY UNUSUAL BLEEDING      If you need a refill on your cardiac medications before your next appointment, please call your pharmacy.

## 2017-06-24 NOTE — Progress Notes (Signed)
06/24/2017 Christie Atkinson   13-Jul-1955  [id number]  Primary Physician Patient, No Pcp Per Primary Cardiologist: Dr. Tamala Julian   Reason for Visit/CC: SOB  HPI:  Christie Atkinson is a 62 y.o. female with a h/o gestational DM,  Fibromyalgia, gallbladder disease, asthma and atrial fibrillation, presenting to clinic with complaint of exertional dyspnea.   She was first seen by [facility] when she was admitted to the hospital 02/2015 with symptomatic cholelithiasis and was noted at that time to be in new onset atrial fibrillation of unknown duration. She was placed in IV Cardizem for rate control. Echo showed normal LVEF with moderate LAE. No significant valvular disease. CHA2DS2 VASc score 1. No indication for Outlook. ASA was started. Same hospitalization, she underwent lap chole on 02/24/15. She did well post op. On telemetry heart rate remained stable and it was noted on post op cardiology rounding notes that she was in NSR. She was discharged on ASA and PO Cardizem. Pt failed to f/u in clinic. She has not been seen since her hospitalization 02/2015.   Pt called our office yesterday with new complaints of dyspnea, occasional arm pain and intermitted palpations. Pt requested office visit. She is here today with her son. She looks comfortable at rest. No distress or increased work of breathing. EKG shows atrial fibrillation w/ RVR at 136 bpm. BP is elevated at 150/100. Med list reviewed. Only listed Med is ASA. No cardizem. Pt states she was never prescribed it in 2016 after hospital discharge. Did not know she was suppose to take it.   Pt states she started having symptoms 1 month ago, which she thinks was triggered by an asthma attack, which is better. She denies any use or albuterol. She notes some slight abdominal fullness but no LEE. She has cut out caffiene. No n/v/d. No h/o abnormal bleeding. No chest pain.    No outpatient medications have been marked as taking for the 06/24/17 encounter (Office  Visit) with Consuelo Pandy, PA-C.   Allergies  Allergen Reactions  . Other     Metals Polyester - including hospital gowns and sheets - allergic contact dermatitis  . Sulfur    Past Medical History:  Diagnosis Date  . Fibromyalgia   . Gestational diabetes   . Heart murmur    Family History  Problem Relation Age of Onset  . Heart disease Mother   . Hypertension Mother   . Hyperlipidemia Mother   . Heart disease Father   . Heart attack Father   . Hypertension Father   . Hyperlipidemia Father    Past Surgical History:  Procedure Laterality Date  . ABDOMINAL HYSTERECTOMY    . APPENDECTOMY    . CHOLECYSTECTOMY N/A 02/24/2015   Procedure: LAPAROSCOPIC CHOLECYSTECTOMY;  Surgeon: [name], MD;  Location: [place];  Service: General;  Laterality: N/A;   Social History   Socioeconomic History  . Marital status: Married    Spouse name: Not on file  . Number of children: Not on file  . Years of education: Not on file  . Highest education level: Not on file  Social Needs  . Financial resource strain: Not on file  . Food insecurity - worry: Not on file  . Food insecurity - inability: Not on file  . Transportation needs - medical: Not on file  . Transportation needs - non-medical: Not on file  Occupational History  . Not on file  Tobacco Use  . Smoking status: Never Smoker  . Smokeless  tobacco: Never Used  Substance and Sexual Activity  . Alcohol use: Yes    Comment: little  . Drug use: No  . Sexual activity: Not on file  Other Topics Concern  . Not on file  Social History Narrative  . Not on file     Review of Systems: General: negative for chills, fever, night sweats or weight changes.  Cardiovascular: negative for chest pain, dyspnea on exertion, edema, orthopnea, palpitations, paroxysmal nocturnal dyspnea or shortness of breath Dermatological: negative for rash Respiratory: negative for cough or wheezing Urologic: negative for hematuria Abdominal:  negative for nausea, vomiting, diarrhea, bright red blood per rectum, melena, or hematemesis Neurologic: negative for visual changes, syncope, or dizziness All other systems reviewed and are otherwise negative except as noted above.   Physical Exam:  Blood pressure (!) 150/100, pulse 84, height 5\' 6"  (1.676 m), weight 264 lb 12.8 oz (120.1 kg), SpO2 93 %.  General appearance: alert, cooperative, no distress and moderately obese Neck: no carotid bruit and no JVD Lungs: clear to auscultation bilaterally Heart: irregularly irregular rhythm and tachy rate Abdomen: soft, non-tender; bowel sounds normal; no masses,  no organomegaly Extremities: extremities normal, atraumatic, no cyanosis or edema Pulses: 2+ and symmetric Skin: Skin color, texture, turgor normal. No rashes or lesions Neurologic: Grossly normal  EKG atrial fibrillation w/ RVR  136 bpm-- personally reviewed   ASSESSMENT AND PLAN:   1. Atrial Fibrillation w/ RVR: first diagnosed in 2016. Pt lost to f/u and has not been on Cardizem as initially recommended (pt states she was unaware to take). Despite RVR, she is comfortable at rest. She has mild dyspnea with exertion but denies syncope/ near syncope. No hypotension. BP is elevated at 150/100. Pt notes some abdominal fullness and some recent orthopnea, but no LEE on exam and lungs are CTAB. O2 sats are stable on RA. At this point, It don't think that she is unstable to the point where she would need to be admitted to the hospital. I think outpatient management is reasonable. If she becomes more unstable in regards to symptoms, she is to go to the closest ER. We will check basic labs today including CBC, BMP, BNP and TSH. We will initiate PO Cardizem, 120 mg to start, and can further titrate if needed. Given possible need for DCCV, we will start Eliquis 5 mg BID. Pt denies h/o abnormal bleeding. CBC and BMP today for baseline assessment of H/H and renal function. She was given instruction to  stop ASA. Pt to f/u in 2 weeks. If she is still in afib, will set up DCCV after 3 weeks of Eliquis. We discussed plan and pt is in agreement. Pt instructed to f/u with phone call if HR and symptoms not improved after starting rate control medication and we will arrange sooner f/u. Pt advised to go to the ED if she develops any significant symptoms at rest. Call if any abnormal bleeding. Given body habitus/ obesity, we will also need to consider sleep study in the future  to r/o OSA. If initial plan fails and if she continues to have difficulties controlling afib, will refer to the [facility]ib Clinic for further management.   Khiara Shuping Ladoris Gene, MHS [facility] 06/24/2017 10:35 AM

## 2017-06-25 ENCOUNTER — Emergency Department (HOSPITAL_COMMUNITY): Payer: Medicare Other

## 2017-06-25 ENCOUNTER — Other Ambulatory Visit: Payer: Self-pay

## 2017-06-25 ENCOUNTER — Telehealth: Payer: Self-pay | Admitting: Cardiology

## 2017-06-25 ENCOUNTER — Inpatient Hospital Stay (HOSPITAL_COMMUNITY)
Admission: EM | Admit: 2017-06-25 | Discharge: 2017-06-29 | DRG: 309 | Disposition: A | Discharge status: Home / Self Care | Payer: Medicare Other | Attending: Internal Medicine | Admitting: Internal Medicine

## 2017-06-25 ENCOUNTER — Encounter (HOSPITAL_COMMUNITY): Payer: Self-pay | Admitting: Emergency Medicine

## 2017-06-25 ENCOUNTER — Telehealth: Payer: Self-pay | Admitting: Physician Assistant

## 2017-06-25 DIAGNOSIS — Z91048 Other nonmedicinal substance allergy status: Secondary | ICD-10-CM

## 2017-06-25 DIAGNOSIS — M797 Fibromyalgia: Secondary | ICD-10-CM | POA: Diagnosis present

## 2017-06-25 DIAGNOSIS — Z7901 Long term (current) use of anticoagulants: Secondary | ICD-10-CM

## 2017-06-25 DIAGNOSIS — I5031 Acute diastolic (congestive) heart failure: Secondary | ICD-10-CM | POA: Diagnosis not present

## 2017-06-25 DIAGNOSIS — R0602 Shortness of breath: Secondary | ICD-10-CM | POA: Diagnosis not present

## 2017-06-25 DIAGNOSIS — Z882 Allergy status to sulfonamides status: Secondary | ICD-10-CM | POA: Diagnosis not present

## 2017-06-25 DIAGNOSIS — Z888 Allergy status to other drugs, medicaments and biological substances status: Secondary | ICD-10-CM | POA: Diagnosis not present

## 2017-06-25 DIAGNOSIS — Z8249 Family history of ischemic heart disease and other diseases of the circulatory system: Secondary | ICD-10-CM | POA: Diagnosis not present

## 2017-06-25 DIAGNOSIS — I493 Ventricular premature depolarization: Secondary | ICD-10-CM | POA: Diagnosis present

## 2017-06-25 DIAGNOSIS — R739 Hyperglycemia, unspecified: Secondary | ICD-10-CM | POA: Diagnosis present

## 2017-06-25 DIAGNOSIS — Z9049 Acquired absence of other specified parts of digestive tract: Secondary | ICD-10-CM | POA: Diagnosis not present

## 2017-06-25 DIAGNOSIS — I4891 Unspecified atrial fibrillation: Secondary | ICD-10-CM | POA: Diagnosis not present

## 2017-06-25 DIAGNOSIS — E876 Hypokalemia: Secondary | ICD-10-CM | POA: Diagnosis not present

## 2017-06-25 DIAGNOSIS — Z6841 Body Mass Index (BMI) 40.0 and over, adult: Secondary | ICD-10-CM

## 2017-06-25 DIAGNOSIS — J45909 Unspecified asthma, uncomplicated: Secondary | ICD-10-CM

## 2017-06-25 DIAGNOSIS — I480 Paroxysmal atrial fibrillation: Principal | ICD-10-CM | POA: Diagnosis present

## 2017-06-25 DIAGNOSIS — I361 Nonrheumatic tricuspid (valve) insufficiency: Secondary | ICD-10-CM | POA: Diagnosis not present

## 2017-06-25 DIAGNOSIS — R030 Elevated blood-pressure reading, without diagnosis of hypertension: Secondary | ICD-10-CM | POA: Diagnosis present

## 2017-06-25 DIAGNOSIS — I509 Heart failure, unspecified: Secondary | ICD-10-CM | POA: Diagnosis not present

## 2017-06-25 DIAGNOSIS — Z90710 Acquired absence of both cervix and uterus: Secondary | ICD-10-CM

## 2017-06-25 LAB — BASIC METABOLIC PANEL WITH GFR
Anion gap: 9 (ref 5–15)
BUN: 11 mg/dL (ref 6–20)
CO2: 22 mmol/L (ref 22–32)
Calcium: 8.7 mg/dL — ABNORMAL LOW (ref 8.9–10.3)
Chloride: 104 mmol/L (ref 101–111)
Creatinine, Ser: 0.87 mg/dL (ref 0.44–1.00)
GFR calc Af Amer: >60 mL/min
GFR calc non Af Amer: >60 mL/min
Glucose, Bld: 108 mg/dL — ABNORMAL HIGH (ref 65–99)
Potassium: 3.8 mmol/L (ref 3.5–5.1)
Sodium: 135 mmol/L (ref 135–145)

## 2017-06-25 LAB — I-STAT TROPONIN, ED: Troponin i, poc: 0 ng/mL (ref 0.00–0.08)

## 2017-06-25 LAB — CBC
HCT: 39.1 % (ref 36.0–46.0)
Hemoglobin: 12.8 g/dL (ref 12.0–15.0)
MCH: 30.8 pg (ref 26.0–34.0)
MCHC: 32.7 g/dL (ref 30.0–36.0)
MCV: 94 fL (ref 78.0–100.0)
Platelets: 218 K/uL (ref 150–400)
RBC: 4.16 MIL/uL (ref 3.87–5.11)
RDW: 13.2 % (ref 11.5–15.5)
WBC: 7.7 K/uL (ref 4.0–10.5)

## 2017-06-25 LAB — BRAIN NATRIURETIC PEPTIDE: B Natriuretic Peptide: 300.1 pg/mL — ABNORMAL HIGH (ref 0.0–100.0)

## 2017-06-25 MED ORDER — DILTIAZEM HCL-DEXTROSE 100-5 MG/100ML-% IV SOLN (PREMIX)
5.0000 mg/h | Freq: Once | INTRAVENOUS | Status: AC
  Administered 2017-06-25: 5 mg/h via INTRAVENOUS
  Filled 2017-06-25: qty 100

## 2017-06-25 MED ORDER — FUROSEMIDE 10 MG/ML IJ SOLN
40.0000 mg | Freq: Once | INTRAMUSCULAR | Status: AC
  Administered 2017-06-25: 40 mg via INTRAVENOUS
  Filled 2017-06-25: qty 4

## 2017-06-25 NOTE — Telephone Encounter (Signed)
Received page that patient called answering service again asking about admission orders, but the number she provided was her daughter whom she is not with. Tried her mobile number but reached another daughter. She told me to try calling brother but this went right to voicemail. The page was in reference to "no admission orders" - per triage note patient was under the impression there would be orders in (not clear where this came from as I was sitting next to the APP who instructed her to proceed to ER for further eval by EDP for recent suspicion of CHF). As I was unable to reach her I called ED triage and relayed the situation to them and asked them to make Ms. Vera aware we got her page but she provided incorrect number, and that it looks like she is currently undergoing ED assessment to see whether or not she will need admission. Jacqlyn Larsen took the message and reported they would relay to pt. Jasim Harari PA-C

## 2017-06-25 NOTE — ED Notes (Signed)
MD notified of current vitals.

## 2017-06-25 NOTE — Telephone Encounter (Signed)
Pt called after hours answering service for complaints of SOB and needing a prescription for an inhaler. She has a history of asthma, however has not had an acute exacerbation since around the end of December. She was recently seen in the office (06/24/17) for dyspnea and palpitations and was dx with Afib RVR, started on Cardizem CD 120/Eliquis, and had outpatient labs drawn. Her BNP level was found to be >3000. She denies chest pain or palpitations today and actually reports that she feels better overall. She does report that in taking her pulse, her HR has been "spiking" with ambulation. Due to her recent diagnosis and symptoms (dyspnea with exertion and mildly at rest), it was recommended that she go to the emergency room for further evaluation.   Pt knows not to drive herself and has plans in place to have one of her children to bring her to the hospital.   Kathyrn Drown NP-C [place] Pager: [phone]

## 2017-06-25 NOTE — ED Notes (Signed)
10mg  bolus of Diltiazem given per EDP verbal order

## 2017-06-25 NOTE — ED Provider Notes (Signed)
[facility] EMERGENCY DEPARTMENT Provider Note   CSN: [id number] Arrival date & time: 06/25/17  1329     History   Chief Complaint Chief Complaint  Patient presents with  . Congestive Heart Failure  . Atrial Fibrillation    HPI Christie Atkinson is a 62 y.o. female.  HPI 62 year old female presents today complaining of shortness of breath that began around Christmas.  She describes it as worsening at night.  She describes paroxysmal nocturnal dyspnea.  She is now sleeping on up to 2-3 pillows.  She states it is present all the time and feels like her breath is cut off.  She has not noted any associated chest pain, lateralized leg swelling, history of PE, or DVT.  She has noted increased abdominal girth.  He has a history of atrial fibrillation in the past.  She has not been on any medication for this.  She states that she was seen by Dr. Pernell Dupre in the office yesterday.  She states that she was started on diltiazem 120 ER as well as Eliquis.  She has taken 2 doses of this.  She presents to the ED because she was under the impression that she had abnormalities that need to be treated.  However, notes from cardiology nurse practitioner states that patient called the answering service complaining of shortness of breath.  They note she was seen in the office yesterday for dyspnea and palpitations.  They noted a diagnosis of A. fib RVR that she was started on the Cardizem and Eliquis and had outpatient labs drawn.  They note that her BNP was found to be greater than 3000. Past Medical History:  Diagnosis Date  . CHF (congestive heart failure) ([place])   . Fibromyalgia   . Gestational diabetes   . Heart murmur     Patient Active Problem List   Diagnosis Date Noted  . Preoperative cardiovascular examination 02/23/2015  . Atrial fibrillation ([place]) 02/23/2015  . Elevated blood pressure reading without diagnosis of hypertension 02/23/2015  . Abdominal pain   . Calculus of  gallbladder with biliary obstruction but without cholecystitis   . Symptomatic cholelithiasis   . Cholelithiasis 02/22/2015    Past Surgical History:  Procedure Laterality Date  . ABDOMINAL HYSTERECTOMY    . APPENDECTOMY    . CHOLECYSTECTOMY N/A 02/24/2015   Procedure: LAPAROSCOPIC CHOLECYSTECTOMY;  Surgeon: [name], MD;  Location: [facility];  Service: General;  Laterality: N/A;    OB History    No data available       Home Medications    Prior to Admission medications   Medication Sig Start Date End Date Taking? Authorizing Provider  apixaban (ELIQUIS) 5 MG TABS tablet Take 1 tablet (5 mg total) by mouth 2 (two) times daily. 06/24/17   Lyda Jester M, PA-C  diltiazem (CARDIZEM CD) 120 MG 24 hr capsule Take 1 capsule (120 mg total) by mouth daily. 06/24/17 09/22/17  Consuelo Pandy, PA-C    Family History Family History  Problem Relation Age of Onset  . Heart disease Mother   . Hypertension Mother   . Hyperlipidemia Mother   . Heart disease Father   . Heart attack Father   . Hypertension Father   . Hyperlipidemia Father     Social History Social History   Tobacco Use  . Smoking status: Never Smoker  . Smokeless tobacco: Never Used  Substance Use Topics  . Alcohol use: Yes    Comment: little  . Drug use:  No     Allergies   Other and Sulfur   Review of Systems Review of Systems   Physical Exam Updated Vital Signs BP (!) 143/119   Pulse (!) 103   Temp 97.8 F (36.6 C) (Oral)   Resp 18   SpO2 99%   Physical Exam  Constitutional: She is oriented to person, place, and time. She appears well-developed and well-nourished. No distress.  HENT:  Head: Normocephalic.  Right Ear: External ear normal.  Left Ear: External ear normal.  Nose: Nose normal.  Mouth/Throat: Oropharynx is clear and moist.  Eyes: EOM are normal. Pupils are equal, round, and reactive to light.  Neck: Normal range of motion. Neck supple.  Cardiovascular: An irregularly  irregular rhythm present. Tachycardia present.  Pulmonary/Chest: Effort normal.  Bibasilar rhonchi  Abdominal: Soft. Bowel sounds are normal.  Musculoskeletal: Normal range of motion. She exhibits edema.  Neurological: She is alert and oriented to person, place, and time.  Skin: Skin is warm and dry. Capillary refill takes less than 2 seconds.  Psychiatric: She has a normal mood and affect.  Nursing note and vitals reviewed.    ED Treatments / Results  Labs (all labs ordered are listed, but only abnormal results are displayed) Labs Reviewed  BASIC METABOLIC PANEL - Abnormal; Notable for the following components:      Result Value   Glucose, Bld 108 (*)    Calcium 8.7 (*)    All other components within normal limits  BRAIN NATRIURETIC PEPTIDE - Abnormal; Notable for the following components:   B Natriuretic Peptide 300.1 (*)    All other components within normal limits  CBC  I-STAT TROPONIN, ED    EKG  EKG Interpretation  Date/Time:  Saturday June 25 2017 13:53:51 EST Ventricular Rate:  100 PR Interval:    QRS Duration: 84 QT Interval:  370 QTC Calculation: 477 R Axis:   93 Text Interpretation:  Atrial fibrillation Rightward axis Abnormal ECG Confirmed by Pattricia Boss [phone]) on 06/25/2017 4:51:08 PM       Radiology Dg Chest 2 View  Result Date: 06/25/2017 CLINICAL DATA:  Increasing shortness of breath. AFib. Elevated BNP. EXAM: CHEST  2 VIEW COMPARISON:  08/23/2003 FINDINGS: The cardiac silhouette is mildly enlarged. Mediastinal contours appear intact. Tortuosity of the aorta. There is no evidence pneumothorax. Right pleural effusion versus pleural thickening with airspace consolidation or pulmonary nodule in the right costophrenic angle. Mildly increased interstitial markings. Osseous structures are without acute abnormality. Soft tissues are grossly normal. IMPRESSION: Enlarged cardiac silhouette. Mild pulmonary vascular congestion. Right pleural effusion versus  pleural thickening with airspace consolidation versus pulmonary nodule in the right costophrenic angle. Electronically Signed   By: Fidela Salisbury M.D.   On: 06/25/2017 15:07    Procedures Procedures (including critical care time)  Medications Ordered in ED Medications - No data to display   Initial Impression / Assessment and Plan / ED Course  I have reviewed the triage vital signs and the nursing notes.  Pertinent labs & imaging results that were available during my care of the patient were reviewed by me and considered in my medical decision making (see chart for details).     Patient with hr 130-140 on monitor with some dyspnea. Iv lasix given Iv cardizem bolus and infusion started HR down to 110 at rest but increases to 140 with any exertion. Patient had started cardizem and has had two doses as op Plan admission for rate control and diuresis Discussed with  Dr. Rhae Hammock and will admit for cardiology   CRITICAL CARE Performed by: Pattricia Boss Total critical care time: 30 minutes  Titration of cardizem for rate control with mulitple bedside rechecks-with one episode of borderline hypotension following bolus.  HR down to 100s at rest Critical care time was exclusive of separately billable procedures and treating other patients. Critical care was necessary to treat or prevent imminent or life-threatening deterioration. Critical care was time spent personally by me on the following activities: development of treatment plan with patient and/or surrogate as well as nursing, discussions with consultants, evaluation of patient's response to treatment, examination of patient, obtaining history from patient or surrogate, ordering and performing treatments and interventions, ordering and review of laboratory studies, ordering and review of radiographic studies, pulse oximetry and re-evaluation of patient's condition.  Final Clinical Impressions(s) / ED Diagnoses   Final diagnoses:   Atrial fibrillation with RVR (HCC)  Congestive heart failure, unspecified HF chronicity, unspecified heart failure type [facility])    ED Discharge Orders    None       Pattricia Boss, MD 06/25/17 [phone]

## 2017-06-25 NOTE — ED Triage Notes (Addendum)
Pt has abnormal lab results yesterday at cardiology. BNP elevated. Pt states it all started around christmas. Pt states diagnosis of afib and CHF. Pt very difficult to triage, will not give detailed answers and states "they sent me here and were supposed to put in the orders." EKG shows afib

## 2017-06-26 DIAGNOSIS — I4891 Unspecified atrial fibrillation: Secondary | ICD-10-CM

## 2017-06-26 DIAGNOSIS — I509 Heart failure, unspecified: Secondary | ICD-10-CM

## 2017-06-26 LAB — LIPID PANEL
Cholesterol: 107 mg/dL (ref 0–200)
HDL: 20 mg/dL — AB (ref >40)
LDL Cholesterol: 72 mg/dL (ref 0–99)
TRIGLYCERIDES: 77 mg/dL (ref <150)
Total CHOL/HDL Ratio: 5.4 RATIO
VLDL: 15 mg/dL (ref 0–40)

## 2017-06-26 LAB — BASIC METABOLIC PANEL
ANION GAP: 11 (ref 5–15)
BUN: 10 mg/dL (ref 6–20)
CALCIUM: 8.7 mg/dL — AB (ref 8.9–10.3)
CO2: 23 mmol/L (ref 22–32)
CREATININE: 1 mg/dL (ref 0.44–1.00)
Chloride: 105 mmol/L (ref 101–111)
GFR, EST NON AFRICAN AMERICAN: 59 mL/min — AB (ref >60)
Glucose, Bld: 147 mg/dL — ABNORMAL HIGH (ref 65–99)
Potassium: 4.2 mmol/L (ref 3.5–5.1)
Sodium: 139 mmol/L (ref 135–145)

## 2017-06-26 LAB — CBC
HEMATOCRIT: 37.2 % (ref 36.0–46.0)
HEMOGLOBIN: 12.2 g/dL (ref 12.0–15.0)
MCH: 30.6 pg (ref 26.0–34.0)
MCHC: 32.8 g/dL (ref 30.0–36.0)
MCV: 93.2 fL (ref 78.0–100.0)
Platelets: 232 10*3/uL (ref 150–400)
RBC: 3.99 MIL/uL (ref 3.87–5.11)
RDW: 13.6 % (ref 11.5–15.5)
WBC: 8.6 10*3/uL (ref 4.0–10.5)

## 2017-06-26 LAB — HEMOGLOBIN A1C
Hgb A1c MFr Bld: 5.8 % — ABNORMAL HIGH (ref 4.8–5.6)
Mean Plasma Glucose: 119.76 mg/dL

## 2017-06-26 LAB — HIV ANTIBODY (ROUTINE TESTING W REFLEX): HIV SCREEN 4TH GENERATION: NONREACTIVE

## 2017-06-26 MED ORDER — FUROSEMIDE 10 MG/ML IJ SOLN
40.0000 mg | Freq: Two times a day (BID) | INTRAMUSCULAR | Status: DC
  Administered 2017-06-26 – 2017-06-28 (×5): 40 mg via INTRAVENOUS
  Filled 2017-06-26 (×5): qty 4

## 2017-06-26 MED ORDER — APIXABAN 5 MG PO TABS
5.0000 mg | ORAL_TABLET | Freq: Two times a day (BID) | ORAL | Status: DC
  Administered 2017-06-26 – 2017-06-29 (×7): 5 mg via ORAL
  Filled 2017-06-26 (×8): qty 1

## 2017-06-26 MED ORDER — DILTIAZEM HCL-DEXTROSE 100-5 MG/100ML-% IV SOLN (PREMIX)
5.0000 mg/h | INTRAVENOUS | Status: DC
  Administered 2017-06-26 – 2017-06-27 (×3): 7.5 mg/h via INTRAVENOUS
  Filled 2017-06-26 (×3): qty 100

## 2017-06-26 MED ORDER — ACETAMINOPHEN 325 MG PO TABS
650.0000 mg | ORAL_TABLET | ORAL | Status: DC | PRN
  Administered 2017-06-26 – 2017-06-29 (×5): 650 mg via ORAL
  Filled 2017-06-26 (×5): qty 2

## 2017-06-26 NOTE — Progress Notes (Signed)
Patient arrived to the unit from the ED. Patient is alert and oriented. Patient VS are 114/50, HR 95, O2 95% and RR14. Patient Is on a Cardizem drip and HR is controlled. Patient lunch tray has been ordered 2 g Sodium diet. Patient is resting comfortably in bed.

## 2017-06-26 NOTE — H&P (Signed)
Cardiology Admission History and Physical:   Patient ID: Christie Atkinson; MRN: [id number]; DOB: July 29, 1955   Admission date: 06/25/2017  Primary Care Provider: Patient, No Pcp Per Primary Cardiologist: Dr. Tamala Julian   Chief Complaint:  AF, dyspnea  Patient Profile:   Christie Atkinson is a 62 y.o. female with a history of AF, heart failure, fibromyalgia who presents with AFRVR and heart failure exacerbation.   History of Present Illness:   Christie Atkinson is a 62 y.o. female with a history of AF, heart failure, fibromyalgia who presents with AFRVR and heart failure exacerbation.   The patient was diagnosed with AF during a hospitalization in 02/2015 for laparoscopic cholecystectomy. She was discharged on oral diltiazem and ASA 81. She was not evaluated by cardiology again until 06/24/17, when she was seen in clinic and reported one month of exertional dyspnea and palpitations. At that visit, she was in AF with HR 130s with elevated BP. She reported at that visit that she never started her diltiazem. At that visit, her diltiazem was restarted and apixaban was initiated with plans for outpatient follow up for possible DCCV. Lab were checked and later revealed pro-BNP >3000. The patient called back to clinic and reported worsening dyspnea, and she was instructed to come to the ED.   In the ED, the patient was in AF with HR up to the 140s. ECG showed AF with PVCs and nonspecific ST changes. Labs showed normal BMP and CBC with negative troponin and elevated BNP of 300. She was given a dose of IV furosemide and was started on a diltiazem infusion. Cardiology was consulted for admission.  On my evaluation, the patient describes her progressive dyspnea with associated PND and orthopnea. She denies chest pain. She does endorse edema in her abdomen >legs.    Past Medical History:  Diagnosis Date  . CHF (congestive heart failure) ([place])   . Fibromyalgia   . Gestational diabetes   . Heart murmur      Past Surgical History:  Procedure Laterality Date  . ABDOMINAL HYSTERECTOMY    . APPENDECTOMY    . CHOLECYSTECTOMY N/A 02/24/2015   Procedure: LAPAROSCOPIC CHOLECYSTECTOMY;  Surgeon: [name], MD;  Location: [facility];  Service: General;  Laterality: N/A;     Medications Prior to Admission: Prior to Admission medications   Medication Sig Start Date End Date Taking? Authorizing Provider  apixaban (ELIQUIS) 5 MG TABS tablet Take 1 tablet (5 mg total) by mouth 2 (two) times daily. 06/24/17  Yes Rosita Fire, Brittainy M, PA-C  diltiazem (CARDIZEM CD) 120 MG 24 hr capsule Take 1 capsule (120 mg total) by mouth daily. 06/24/17 09/22/17 Yes Simmons, Brittainy M, PA-C  Hyaluronic Acid-Vitamin C (HYALURONIC ACID PO) Take 1 tablet by mouth 2 (two) times a week.   Yes [provider]  Multiple Vitamin (MULTIVITAMIN WITH MINERALS) TABS tablet Take 1 tablet by mouth daily as needed (when not eating properly).   Yes [provider]  OVER THE COUNTER MEDICATION Take 250 mg by mouth daily as needed (cold symptoms). "Relora" herbal supplement   Yes [provider]  OVER THE COUNTER MEDICATION Take 3 drops by mouth daily as needed (pain). CBD oil   Yes [provider]     Allergies:    Allergies  Allergen Reactions  . Other Other (See Comments)    Metals Polyester - including hospital gowns and sheets - allergic contact dermatitis  . Sulfa Antibiotics Hives  . Sulfur Hives  Social History:   Social History   Socioeconomic History  . Marital status: Married    Spouse name: Not on file  . Number of children: Not on file  . Years of education: Not on file  . Highest education level: Not on file  Social Needs  . Financial resource strain: Not on file  . Food insecurity - worry: Not on file  . Food insecurity - inability: Not on file  . Transportation needs - medical: Not on file  . Transportation needs - non-medical: Not on file  Occupational History  . Not  on file  Tobacco Use  . Smoking status: Never Smoker  . Smokeless tobacco: Never Used  Substance and Sexual Activity  . Alcohol use: Yes    Comment: little  . Drug use: No  . Sexual activity: Not on file  Other Topics Concern  . Not on file  Social History Narrative  . Not on file    Family History:   The patient's family history includes Heart attack in her father; Heart disease in her father and mother; Hyperlipidemia in her father and mother; Hypertension in her father and mother.    ROS:  All other ROS reviewed and negative.     Physical Exam/Data:   Vitals:   06/25/17 2030 06/25/17 2245 06/25/17 2330 06/26/17 0000  BP: 108/71 102/64 101/67 101/83  Pulse:  (!) 122 (!) 54 95  Resp: (!) 27 16 19 19   Temp:      TempSrc:      SpO2:  95% 97% 94%  Weight:      Height:        Intake/Output Summary (Last 24 hours) at 06/26/2017 0108 Last data filed at 06/26/2017 0053 Gross per 24 hour  Intake -  Output 2600 ml  Net -2600 ml   Filed Weights   06/25/17 1807  Weight: 119.7 kg (264 lb)   Body mass index is 42.61 kg/m.  General:  Well nourished, well developed, in no acute distress  HEENT: normal Neck: JVD at mid-neck at 45 degrees Cardiac:  Tachycardic and irregularly irregular without significant murmur Lungs:  Crackles at bases bilaterally Abd: soft, nontender with some fullness  Ext: 1+ pitting LE edema Musculoskeletal:  No deformities, BUE and BLE strength Skin: warm and dry  Neuro:  No focal abnormalities noted Psych:  Normal affect    EKG:  The ECG that was done was personally reviewed and demonstrates AFRVR with nonspecific ST changes  Relevant CV Studies: Study Conclusions - Left ventricle: The cavity size was normal. Wall thickness was   increased in a pattern of mild LVH. Systolic function was normal.   The estimated ejection fraction was in the range of 55% to 60%. Wall motion was normal; there were no regional wall motion abnormalities. - Left  atrium: The atrium was moderately to severely dilated. Impressions: - Normal LV systolic function; mild LVH; moderate to severe LAE; trace MR and TR  Laboratory Data:  Chemistry Recent Labs  Lab 06/24/17 1046 06/25/17 1359  NA 135 135  K 4.2 3.8  CL 99 104  CO2 19* 22  GLUCOSE 131* 108*  BUN 14 11  CREATININE 1.01* 0.87  CALCIUM 9.3 8.7*  GFRNONAA 60 >60  GFRAA 69 >60  ANIONGAP  --  9    No results for input(s): PROT, ALBUMIN, AST, ALT, ALKPHOS, BILITOT in the last 168 hours. Hematology Recent Labs  Lab 06/24/17 1046 06/25/17 1359  WBC 9.6 7.7  RBC 4.31  4.16  HGB 13.4 12.8  HCT 39.1 39.1  MCV 91 94.0  MCH 31.1 30.8  MCHC 34.3 32.7  RDW 12.9 13.2  PLT 247 218   Cardiac EnzymesNo results for input(s): TROPONINI in the last 168 hours.  Recent Labs  Lab 06/25/17 1404  TROPIPOC 0.00    BNP Recent Labs  Lab 06/24/17 1046 06/25/17 1359  BNP  --  300.1*  PROBNP 3,404*  --     DDimer No results for input(s): DDIMER in the last 168 hours.  Radiology/Studies:  Dg Chest 2 View  Result Date: 06/25/2017 CLINICAL DATA:  Increasing shortness of breath. AFib. Elevated BNP. EXAM: CHEST  2 VIEW COMPARISON:  08/23/2003 FINDINGS: The cardiac silhouette is mildly enlarged. Mediastinal contours appear intact. Tortuosity of the aorta. There is no evidence pneumothorax. Right pleural effusion versus pleural thickening with airspace consolidation or pulmonary nodule in the right costophrenic angle. Mildly increased interstitial markings. Osseous structures are without acute abnormality. Soft tissues are grossly normal. IMPRESSION: Enlarged cardiac silhouette. Mild pulmonary vascular congestion. Right pleural effusion versus pleural thickening with airspace consolidation versus pulmonary nodule in the right costophrenic angle. Electronically Signed   By: Fidela Salisbury M.D.   On: 06/25/2017 15:07    Assessment and Plan:   Christie Atkinson is a 62 y.o. female with a history of  AF, heart failure, fibromyalgia who presents with AFRVR and heart failure exacerbation.   AFRVR The patient has known AF that was untreated until recent clinic visit for [facility] and heart failure symptoms (although prior TTE in 2016 showed normal LV function and indeterminate diastolic dysfunction). She presents to the ED today with worsening dyspnea and signs of volume overload. She is found to have AVRVR that is likely driven by her heart failure exacerbation. She has been started on diltiazem infusion with some improvement of her heart rates. Otherwise, she has CHADS2VASC score of 2 (gender, CHF) and has been on apixaban in anticipation of upcoming DCCV. -Continue to monitor on telemetry -Continue diltiazem infusion. Can likely transition to oral agents in AM. Should choose non-CCB agent if she is found to have systolic dysfunction. -Continue apixaban -Consider DCCV once euvolemic  Suspected heart failure exacerbation The patient has signs and symptoms of heart failure exacerbation. Prior TTE showed normal LV function and LVH. She likely has diastolic dysfunction (although it was described to be indeterminate in the setting of AF at the time of her last study). IV furosemide was initiated in the ED with good UOP. -Continue diuresis with IV furosemide -Repeat TTE ordered  Severity of Illness: The appropriate patient status for this patient is INPATIENT. Inpatient status is judged to be reasonable and necessary in order to provide the required intensity of service to ensure the patient's safety. The patient's presenting symptoms, physical exam findings, and initial radiographic and laboratory data in the context of their chronic comorbidities is felt to place them at high risk for further clinical deterioration. Furthermore, it is not anticipated that the patient will be medically stable for discharge from the hospital within 2 midnights of admission. The following factors support the patient status of  inpatient.   " The patient's presenting symptoms include dyspnea, edema. " The worrisome physical exam findings include hypervolemia. " The initial radiographic and laboratory data are worrisome because of AFRVR. " The chronic co-morbidities include obesity, AF.   * I certify that at the point of admission it is my clinical judgment that the patient will require inpatient hospital care spanning  beyond 2 midnights from the point of admission due to high intensity of service, high risk for further deterioration and high frequency of surveillance required.*    For questions or updates, please contact [place] Please consult [email or web address] for contact info under Cardiology/STEMI.    Signed, Nila Nephew, MD  06/26/2017 1:08 AM

## 2017-06-26 NOTE — Progress Notes (Signed)
Progress Note  Patient Name: Christie Atkinson Date of Encounter: 06/26/2017  Primary Cardiologist: Dr. Tamala Julian  Subjective   She is currently on diltiazem gtt at 7.5mg /hr. In Afib HR 82, however, rate controlled at this time. She states that she is feeling much better, her breathing has improved tremendously.   Inpatient Medications    Scheduled Meds: . apixaban  5 mg Oral BID  . furosemide  40 mg Intravenous BID   Continuous Infusions: . diltiazem (CARDIZEM) infusion 7.5 mg/hr (06/26/17 0448)   PRN Meds: acetaminophen   Vital Signs    Vitals:   06/26/17 0901 06/26/17 1000 06/26/17 1100 06/26/17 1200  BP: (!) 146/87 116/80 122/88 (!) 126/95  Pulse: (!) 113 81  94  Resp: (!) 21 16  18   Temp:      TempSrc:      SpO2: 98% 90% 96% 97%  Weight:      Height:        Intake/Output Summary (Last 24 hours) at 06/26/2017 1302 Last data filed at 06/26/2017 0053 Gross per 24 hour  Intake -  Output 2600 ml  Net -2600 ml   Filed Weights   06/25/17 1807  Weight: 264 lb (119.7 kg)    Physical Exam   General: Well developed, well nourished, NAD Skin: Warm, dry, intact  Head: Normocephalic, atraumatic, clear, moist mucus membranes. Neck: Negative for carotid bruits. No JVD Lungs:Clear to ausculation bilaterally. No wheezes, rales, or rhonchi. Breathing is unlabored. Cardiovascular: Irregular with S1 S2. No murmurs, rubs, or gallops Abdomen: Soft, non-tender, non-distended with normoactive bowel sounds. No obvious abdominal masses. MSK: Strength and tone appear normal for age. 5/5 in all extremities. Mild tenderness to palpation  Extremities: No edema. No clubbing or cyanosis. DP/PT pulses 2+ bilaterally Neuro: Alert and oriented. No focal deficits. No facial asymmetry. MAE spontaneously. Psych: Responds to questions appropriately with normal affect.    Labs    Chemistry Recent Labs  Lab 06/24/17 1046 06/25/17 1359 06/26/17 0517  NA 135 135 139  K 4.2 3.8 4.2    CL 99 104 105  CO2 19* 22 23  GLUCOSE 131* 108* 147*  BUN 14 11 10   CREATININE 1.01* 0.87 1.00  CALCIUM 9.3 8.7* 8.7*  GFRNONAA 60 >60 59*  GFRAA 69 >60 >60  ANIONGAP  --  9 11     Hematology Recent Labs  Lab 06/24/17 1046 06/25/17 1359 06/26/17 0517  WBC 9.6 7.7 8.6  RBC 4.31 4.16 3.99  HGB 13.4 12.8 12.2  HCT 39.1 39.1 37.2  MCV 91 94.0 93.2  MCH 31.1 30.8 30.6  MCHC 34.3 32.7 32.8  RDW 12.9 13.2 13.6  PLT 247 218 232    Cardiac EnzymesNo results for input(s): TROPONINI in the last 168 hours.  Recent Labs  Lab 06/25/17 1404  TROPIPOC 0.00     BNP Recent Labs  Lab 06/24/17 1046 06/25/17 1359  BNP  --  300.1*  PROBNP 3,404*  --      DDimer No results for input(s): DDIMER in the last 168 hours.   Radiology    Dg Chest 2 View  Result Date: 06/25/2017 CLINICAL DATA:  Increasing shortness of breath. AFib. Elevated BNP. EXAM: CHEST  2 VIEW COMPARISON:  08/23/2003 FINDINGS: The cardiac silhouette is mildly enlarged. Mediastinal contours appear intact. Tortuosity of the aorta. There is no evidence pneumothorax. Right pleural effusion versus pleural thickening with airspace consolidation or pulmonary nodule in the right costophrenic angle. Mildly increased interstitial markings.  Osseous structures are without acute abnormality. Soft tissues are grossly normal. IMPRESSION: Enlarged cardiac silhouette. Mild pulmonary vascular congestion. Right pleural effusion versus pleural thickening with airspace consolidation versus pulmonary nodule in the right costophrenic angle. Electronically Signed   By: Fidela Salisbury M.D.   On: 06/25/2017 15:07    Telemetry    06/26/17 Afib HR 82 - Personally Reviewed  ECG    06/25/17 Afib HR 100 - Personally Reviewed  Cardiac Studies   Echo 02/23/15: ------------------------------------------------------------------- Study Conclusions  - Left ventricle: The cavity size was normal. Wall thickness was   increased in a  pattern of mild LVH. Systolic function was normal.   The estimated ejection fraction was in the range of 55% to 60%.   Wall motion was normal; there were no regional wall motion   abnormalities. - Left atrium: The atrium was moderately to severely dilated.  Impressions:  - Normal LV systolic function; mild LVH; moderate to severe LAE;   trace MR and TR.  Patient Profile     62 y.o. female with a history of Atrial fibrillation with RVR intially dx'd in 2016 s/p cholecystectomy, gallbladder disease, asthma, and fibromyalgia who is being followed by cardiology for the management of atrial fibrillation with rapid ventricular response.    Assessment & Plan    1. Atrial fibrillation with rapid ventricular response: -Pt currently on Diltiazem gtt, rate controlled and asymptomatic -Pt was seen in the office on 06/24/17 with Afib RVR, she was started on PO Cardizem 120mg  daily and Eliquis>> the pt was instructed to call if HR increased or if she became symptomatic at home. She called the on call answering service on 06/25/16 and due to her increased respiratory symptoms, she was instructed to come to the hospital. Her rate was elevated to the 140's upon ED arrival>>>per office note from 06/24/17 if pt remains in afib at her 2 week follow up appointment  and continual anticoagulation, consider outpatient DCCV in 3-4 weeks.  -Consider transitioning her off the diltiazem gtt and to her PO at 120mg  -Continue Eliquis -CHA2DS2 VASc score initially to be a 1, however, she now has evidence of heart failure and hyperglycemia. CHA2DS2 VASc at least a 2  2. Suspected diastolic heart failure with pending echo results: -Per PM fellow, pt has s/s of heart failure -On my exam, lungs clear, no s/s of LE swelling -Weight, 264 lb -I&O, net neg 2.6L since admission 06/25/17 -Last echocardiogram completed revealed normal LV function with an EF of 55-60% -Echo this admission, pending results -IV lasix was  initiated last evening with adequate UO, consider changing to oral dose  3. Hyperglycemia: -Will check HB A1c -If elevated, will consider placing on SSI  Signed, Kathyrn Drown NP-C [place] Pager: [phone] 06/26/2017, 1:02 PM   For questions or updates, please contact   Please consult [email or web address] for contact info under Cardiology/STEMI.

## 2017-06-26 NOTE — ED Notes (Signed)
Breakfast tray ordered 

## 2017-06-26 NOTE — ED Notes (Signed)
Brought pillow and water to patient.

## 2017-06-26 NOTE — ED Notes (Addendum)
Patient states that she would like a sonogram vs an ECG because her daughter, who is a Radio broadcast assistant, states that a sonogram will show more, based on her readings. Explained to patient that she can ask her physician about that concern.

## 2017-06-27 ENCOUNTER — Inpatient Hospital Stay (HOSPITAL_COMMUNITY): Payer: Medicare Other

## 2017-06-27 DIAGNOSIS — I361 Nonrheumatic tricuspid (valve) insufficiency: Secondary | ICD-10-CM

## 2017-06-27 LAB — ECHOCARDIOGRAM COMPLETE
Height: 66 in
WEIGHTICAEL: 4009.6 [oz_av]

## 2017-06-27 MED ORDER — DILTIAZEM HCL 30 MG PO TABS
90.0000 mg | ORAL_TABLET | Freq: Three times a day (TID) | ORAL | Status: DC
  Administered 2017-06-27 – 2017-06-28 (×3): 90 mg via ORAL
  Filled 2017-06-27 (×3): qty 1

## 2017-06-27 NOTE — Progress Notes (Signed)
Progress Note  Patient Name: Christie Atkinson Date of Encounter: 06/27/2017  Primary Cardiologist: No primary care provider on file.   Subjective   Feeling much better.  Continues to have palpitations.  Would like to avoid TEE if possible.   Inpatient Medications    Scheduled Meds: . apixaban  5 mg Oral BID  . furosemide  40 mg Intravenous BID   Continuous Infusions: . diltiazem (CARDIZEM) infusion 7.5 mg/hr (06/27/17 0529)   PRN Meds: acetaminophen   Vital Signs    Vitals:   06/27/17 0400 06/27/17 0519 06/27/17 0806 06/27/17 1211  BP: 130/74  124/80 109/81  Pulse: 78 61 91 67  Resp:  18 18   Temp:  (!) 97.5 F (36.4 C) 97.9 F (36.6 C) 97.8 F (36.6 C)  TempSrc:  Oral Oral Oral  SpO2:  100% 94% 94%  Weight:  250 lb 9.6 oz (113.7 kg)    Height:        Intake/Output Summary (Last 24 hours) at 06/27/2017 1303 Last data filed at 06/27/2017 0445 Gross per 24 hour  Intake 813.25 ml  Output -  Net 813.25 ml   Filed Weights   06/25/17 1807 06/27/17 0519  Weight: 264 lb (119.7 kg) 250 lb 9.6 oz (113.7 kg)    Telemetry    Atrial fibrillation.  Rate 70s-110s.  - Personally Reviewed  ECG    N/a  - Personally Reviewed  Physical Exam   VS:  BP 109/81 (BP Location: Left Arm)   Pulse 67   Temp 97.8 F (36.6 C) (Oral)   Resp 18   Ht 5\' 6"  (1.676 m)   Wt 250 lb 9.6 oz (113.7 kg)   SpO2 94%   BMI 40.45 kg/m  , BMI Body mass index is 40.45 kg/m. GENERAL:  Well appearing.  Morbidly obese. HEENT: Pupils equal round and reactive, fundi not visualized, oral mucosa unremarkable NECK:  No jugular venous distention, waveform within normal limits, carotid upstroke brisk and symmetric, no bruits, no thyromegaly LYMPHATICS:  No cervical adenopathy LUNGS:  Clear to auscultation bilaterally HEART:  Irregularly irregular.  Tachycardic.   PMI not displaced or sustained,S1 and S2 within normal limits, no S3, no S4, no clicks, no rubs, no murmurs ABD:  Flat, positive  bowel sounds normal in frequency in pitch, no bruits, no rebound, no guarding, no midline pulsatile mass, no hepatomegaly, no splenomegaly EXT:  2 plus pulses throughout, no edema, no cyanosis no clubbing SKIN:  No rashes no nodules NEURO:  Cranial nerves II through XII grossly intact, motor grossly intact throughout [facility]:  Cognitively intact, oriented to person place and time   Labs    Chemistry Recent Labs  Lab 06/24/17 1046 06/25/17 1359 06/26/17 0517  NA 135 135 139  K 4.2 3.8 4.2  CL 99 104 105  CO2 19* 22 23  GLUCOSE 131* 108* 147*  BUN 14 11 10   CREATININE 1.01* 0.87 1.00  CALCIUM 9.3 8.7* 8.7*  GFRNONAA 60 >60 59*  GFRAA 69 >60 >60  ANIONGAP  --  9 11     Hematology Recent Labs  Lab 06/24/17 1046 06/25/17 1359 06/26/17 0517  WBC 9.6 7.7 8.6  RBC 4.31 4.16 3.99  HGB 13.4 12.8 12.2  HCT 39.1 39.1 37.2  MCV 91 94.0 93.2  MCH 31.1 30.8 30.6  MCHC 34.3 32.7 32.8  RDW 12.9 13.2 13.6  PLT 247 218 232    Cardiac EnzymesNo results for input(s): TROPONINI in the last 168 hours.  Recent Labs  Lab 06/25/17 1404  TROPIPOC 0.00     BNP Recent Labs  Lab 06/24/17 1046 06/25/17 1359  BNP  --  300.1*  PROBNP 3,404*  --      DDimer No results for input(s): DDIMER in the last 168 hours.   Radiology    Dg Chest 2 View  Result Date: 06/25/2017 CLINICAL DATA:  Increasing shortness of breath. AFib. Elevated BNP. EXAM: CHEST  2 VIEW COMPARISON:  08/23/2003 FINDINGS: The cardiac silhouette is mildly enlarged. Mediastinal contours appear intact. Tortuosity of the aorta. There is no evidence pneumothorax. Right pleural effusion versus pleural thickening with airspace consolidation or pulmonary nodule in the right costophrenic angle. Mildly increased interstitial markings. Osseous structures are without acute abnormality. Soft tissues are grossly normal. IMPRESSION: Enlarged cardiac silhouette. Mild pulmonary vascular congestion. Right pleural effusion versus pleural  thickening with airspace consolidation versus pulmonary nodule in the right costophrenic angle. Electronically Signed   By: Fidela Salisbury M.D.   On: 06/25/2017 15:07    Cardiac Studies   Echo pending.   Patient Profile     62 y.o. female with paroxysmal atrial fibrillation, morbid obesity, asthma and fibromyalgia here with atrial fibrillation with RVR.   Assessment & Plan    # Paroxysmal atrial fibrillation: Christie Atkinson remains in atrial fibrillation.  Her rates are ranging from the 70s-110s.  She is feeling much better. Echo pending.  We will plan to switch to oral diltiazem.  She prefers to wait three weeks and undergo elective cardioversion.  Continue Eliquis. Patient was diagnosed with atrial fibrillation in 2016 but hasn't been on anticoagulation until 06/24/17. Thyroid function was normal 06/2017. Consider outpatient sleep study.   # Morbid obesity: Will need to further discuss weight loss options.  This will limit atrial fibrillation management.    # Acute heart failure, type unknown: Echo pending.  Mild pulmonary congestion noted on CXR 1/19 and BNP was elevated to 300.  Likely switch lasix to oral tomorrow.   For questions or updates, please contact [place] Please consult [email or web address] for contact info under Cardiology/STEMI.      Signed, Skeet Latch, MD  06/27/2017, 1:03 PM

## 2017-06-27 NOTE — Progress Notes (Signed)
  Echocardiogram 2D Echocardiogram has been performed.  Christie Atkinson 06/27/2017, 3:28 PM

## 2017-06-28 DIAGNOSIS — I5031 Acute diastolic (congestive) heart failure: Secondary | ICD-10-CM

## 2017-06-28 LAB — BASIC METABOLIC PANEL
ANION GAP: 12 (ref 5–15)
BUN: 11 mg/dL (ref 6–20)
CHLORIDE: 100 mmol/L — AB (ref 101–111)
CO2: 25 mmol/L (ref 22–32)
Calcium: 8.8 mg/dL — ABNORMAL LOW (ref 8.9–10.3)
Creatinine, Ser: 0.99 mg/dL (ref 0.44–1.00)
GFR, EST NON AFRICAN AMERICAN: 60 mL/min — AB (ref >60)
Glucose, Bld: 109 mg/dL — ABNORMAL HIGH (ref 65–99)
POTASSIUM: 3.3 mmol/L — AB (ref 3.5–5.1)
Sodium: 137 mmol/L (ref 135–145)

## 2017-06-28 LAB — MAGNESIUM: MAGNESIUM: 2.2 mg/dL (ref 1.7–2.4)

## 2017-06-28 MED ORDER — DILTIAZEM HCL ER COATED BEADS 240 MG PO CP24
240.0000 mg | ORAL_CAPSULE | Freq: Every day | ORAL | Status: DC
  Administered 2017-06-28 – 2017-06-29 (×2): 240 mg via ORAL
  Filled 2017-06-28 (×2): qty 1

## 2017-06-28 MED ORDER — POTASSIUM CHLORIDE CRYS ER 20 MEQ PO TBCR
20.0000 meq | EXTENDED_RELEASE_TABLET | Freq: Two times a day (BID) | ORAL | Status: DC
  Administered 2017-06-28 – 2017-06-29 (×3): 20 meq via ORAL
  Filled 2017-06-28 (×3): qty 1

## 2017-06-28 MED ORDER — FUROSEMIDE 40 MG PO TABS
40.0000 mg | ORAL_TABLET | Freq: Every day | ORAL | Status: DC
  Administered 2017-06-29: 40 mg via ORAL
  Filled 2017-06-28: qty 1

## 2017-06-28 NOTE — H&P (View-Only) (Signed)
Progress Note  Patient Name: Christie Atkinson Date of Encounter: 06/28/2017  Primary Cardiologist: No primary care provider on file.   Subjective   Feeling weak and tired this morning. Says she didn't sleep well. No further palpitations, CP, or SOB. Some weakness and dizziness with getting up to the bathroom.   Inpatient Medications    Scheduled Meds: . apixaban  5 mg Oral BID  . diltiazem  90 mg Oral Q8H  . furosemide  40 mg Intravenous BID   Continuous Infusions:  PRN Meds: acetaminophen   Vital Signs    Vitals:   06/27/17 1921 06/28/17 0004 06/28/17 0426 06/28/17 0900  BP: (!) 129/97 (!) 108/54 126/66 (!) 101/58  Pulse: 72 78 81   Resp: 18 18 19 18   Temp: 98.3 F (36.8 C) 98 F (36.7 C) 98.3 F (36.8 C) 98.4 F (36.9 C)  TempSrc: Oral Oral Oral Oral  SpO2: 96%  96% 96%  Weight:   247 lb 11.2 oz (112.4 kg)   Height:        Intake/Output Summary (Last 24 hours) at 06/28/2017 1002 Last data filed at 06/28/2017 0927 Gross per 24 hour  Intake 550 ml  Output -  Net 550 ml   Filed Weights   06/25/17 1807 06/27/17 0519 06/28/17 0426  Weight: 264 lb (119.7 kg) 250 lb 9.6 oz (113.7 kg) 247 lb 11.2 oz (112.4 kg)    Telemetry    A fib 70-90's mostly - Personally Reviewed  ECG    No new tracings.  Physical Exam   General: Drowsy. Morbidly obese. No resp difficulty. HEENT: Normal Neck: Supple. JVP difficult to assess with body habitus. Carotids 2+ bilat; no bruits. No thyromegaly or nodule noted. Cor: Irregularly irregular, No M/G/R noted Lungs: clear, diminished bilaterally, normal effort. On 2 L [place] Abdomen: Soft, non-tender, non-distended, no HSM. No bruits or masses. +BS  Extremities: No cyanosis, clubbing, or rash. R and LLE no edema.  Neuro: Alert & orientedx3, cranial nerves grossly intact. moves all 4 extremities w/o difficulty. Affect pleasant  VS:  BP (!) 101/58 (BP Location: Right Arm)   Pulse 81   Temp 98.4 F (36.9 C) (Oral)   Resp 18    Ht 5\' 6"  (1.676 m)   Wt 247 lb 11.2 oz (112.4 kg)   SpO2 96%   BMI 39.98 kg/m  , BMI Body mass index is 39.98 kg/m.   Labs    Chemistry Recent Labs  Lab 06/25/17 1359 06/26/17 0517 06/28/17 0249  NA 135 139 137  K 3.8 4.2 3.3*  CL 104 105 100*  CO2 22 23 25   GLUCOSE 108* 147* 109*  BUN 11 10 11   CREATININE 0.87 1.00 0.99  CALCIUM 8.7* 8.7* 8.8*  GFRNONAA >60 59* 60*  GFRAA >60 >60 >60  ANIONGAP 9 11 12      Hematology Recent Labs  Lab 06/24/17 1046 06/25/17 1359 06/26/17 0517  WBC 9.6 7.7 8.6  RBC 4.31 4.16 3.99  HGB 13.4 12.8 12.2  HCT 39.1 39.1 37.2  MCV 91 94.0 93.2  MCH 31.1 30.8 30.6  MCHC 34.3 32.7 32.8  RDW 12.9 13.2 13.6  PLT 247 218 232    Cardiac EnzymesNo results for input(s): TROPONINI in the last 168 hours.  Recent Labs  Lab 06/25/17 1404  TROPIPOC 0.00     BNP Recent Labs  Lab 06/24/17 1046 06/25/17 1359  BNP  --  300.1*  PROBNP 3,404*  --      DDimer  No results for input(s): DDIMER in the last 168 hours.   Radiology    No results found.  Cardiac Studies    Echo 06/27/2017: - Left ventricle: The cavity size was normal. Wall thickness was   increased in a pattern of mild LVH. Systolic function was normal.   The estimated ejection fraction was in the range of 60% to 65%.   Wall motion was normal; there were no regional wall motion   abnormalities. The study is not technically sufficient to allow   evaluation of LV diastolic function. - Mitral valve: Calcified annulus. Mildly thickened leaflets .   There was mild regurgitation. Valve area by continuity equation   (using LVOT flow): 2.07 cm^2. - Left atrium: The atrium was mildly dilated. - Atrial septum: No defect or patent foramen ovale was identified. - Pulmonary arteries: PA peak pressure: 31 mm Hg (S).  Patient Profile     62 y.o. female with paroxysmal atrial fibrillation, morbid obesity, asthma and fibromyalgia admitted 06/25/2017 with atrial fibrillation with RVR.    Assessment & Plan    1. Paroxysmal atrial fibrillation: Christie Atkinson remains in atrial fibrillation. Her rates are ranging from the 70s-90s. Now on oral diltiazem 90 mg q8 hours. Feeling weak, tired, and dizzy when up this am. Will check orthostatics. Echo shows EF 60-65%, unable to evaluate for diastolic dysfunction.   She prefers to wait three weeks and undergo elective cardioversion to avoid TEE.  Continue Eliquis. Patient was diagnosed with atrial fibrillation in 2016 but hasn't been on anticoagulation until 06/24/17. Thyroid function was normal 06/2017. Consider outpatient sleep study.    2. Morbid obesity: Will need to further discuss weight loss options. This will limit A fib management.  3. Acute heart failure, type unknown: No ischemic workup in past. Echo results as above (EF 60-65%).  Mild pulmonary congestion noted on CXR 1/19 and BNP was elevated to 300. Not on home diuretics - getting 40 mg IV lasix BID. Likely switch lasix to oral today vs hold pending orthostatics. No urine output charted. Weight is down 17 lbs from admission. Will check orthostatics- pending. She already received lasix this am.   4. Hypokalemia - K 3.3 this am. Will supplement. Monitor daily BMP with diuresis.    For questions or updates, please contact [place] Please consult [email or web address] for contact info under Cardiology/STEMI.      Signed, Georgiana Shore, NP  06/28/2017, 10:02 AM     Attending Addendum:  History and all data above reviewed.  Patient examined.  I agree with the findings as above.   All available labs, radiology testing, previous records reviewed. Agree with documented assessment and plan. Christie Atkinson is a 62F with with paroxysmal atrial fibrillation, morbid obesity, asthma and fibromyalgia here with atrial fibrillation with RVR.  She remains in atrial fibrillation and rates are much better-controlled.  Overnight she had 2.3 second pauses.  She would benefit from an outpatient sleep  study.  Echo showed LVEF 60-65%.  We will consolidate diltiazem to 240mg  today.  Hold furosemide as her BP is low and she is euvolemic.  Will start lasix 40mg  po daily tomorrow.  Home tomorrow if heart rate and BP remain stable.       Tiffany C. Oval Linsey, MD, [facility]  06/28/2017 12:15 PM

## 2017-06-28 NOTE — Progress Notes (Addendum)
Progress Note  Patient Name: Christie Atkinson Date of Encounter: 06/28/2017  Primary Cardiologist: No primary care provider on file.   Subjective   Feeling weak and tired this morning. Says she didn't sleep well. No further palpitations, CP, or SOB. Some weakness and dizziness with getting up to the bathroom.   Inpatient Medications    Scheduled Meds: . apixaban  5 mg Oral BID  . diltiazem  90 mg Oral Q8H  . furosemide  40 mg Intravenous BID   Continuous Infusions:  PRN Meds: acetaminophen   Vital Signs    Vitals:   06/27/17 1921 06/28/17 0004 06/28/17 0426 06/28/17 0900  BP: (!) 129/97 (!) 108/54 126/66 (!) 101/58  Pulse: 72 78 81   Resp: 18 18 19 18   Temp: 98.3 F (36.8 C) 98 F (36.7 C) 98.3 F (36.8 C) 98.4 F (36.9 C)  TempSrc: Oral Oral Oral Oral  SpO2: 96%  96% 96%  Weight:   247 lb 11.2 oz (112.4 kg)   Height:        Intake/Output Summary (Last 24 hours) at 06/28/2017 1002 Last data filed at 06/28/2017 0927 Gross per 24 hour  Intake 550 ml  Output -  Net 550 ml   Filed Weights   06/25/17 1807 06/27/17 0519 06/28/17 0426  Weight: 264 lb (119.7 kg) 250 lb 9.6 oz (113.7 kg) 247 lb 11.2 oz (112.4 kg)    Telemetry    A fib 70-90's mostly - Personally Reviewed  ECG    No new tracings.  Physical Exam   General: Drowsy. Morbidly obese. No resp difficulty. HEENT: Normal Neck: Supple. JVP difficult to assess with body habitus. Carotids 2+ bilat; no bruits. No thyromegaly or nodule noted. Cor: Irregularly irregular, No M/G/R noted Lungs: clear, diminished bilaterally, normal effort. On 2 L [place] Abdomen: Soft, non-tender, non-distended, no HSM. No bruits or masses. +BS  Extremities: No cyanosis, clubbing, or rash. R and LLE no edema.  Neuro: Alert & orientedx3, cranial nerves grossly intact. moves all 4 extremities w/o difficulty. Affect pleasant  VS:  BP (!) 101/58 (BP Location: Right Arm)   Pulse 81   Temp 98.4 F (36.9 C) (Oral)   Resp 18    Ht 5\' 6"  (1.676 m)   Wt 247 lb 11.2 oz (112.4 kg)   SpO2 96%   BMI 39.98 kg/m  , BMI Body mass index is 39.98 kg/m.   Labs    Chemistry Recent Labs  Lab 06/25/17 1359 06/26/17 0517 06/28/17 0249  NA 135 139 137  K 3.8 4.2 3.3*  CL 104 105 100*  CO2 22 23 25   GLUCOSE 108* 147* 109*  BUN 11 10 11   CREATININE 0.87 1.00 0.99  CALCIUM 8.7* 8.7* 8.8*  GFRNONAA >60 59* 60*  GFRAA >60 >60 >60  ANIONGAP 9 11 12      Hematology Recent Labs  Lab 06/24/17 1046 06/25/17 1359 06/26/17 0517  WBC 9.6 7.7 8.6  RBC 4.31 4.16 3.99  HGB 13.4 12.8 12.2  HCT 39.1 39.1 37.2  MCV 91 94.0 93.2  MCH 31.1 30.8 30.6  MCHC 34.3 32.7 32.8  RDW 12.9 13.2 13.6  PLT 247 218 232    Cardiac EnzymesNo results for input(s): TROPONINI in the last 168 hours.  Recent Labs  Lab 06/25/17 1404  TROPIPOC 0.00     BNP Recent Labs  Lab 06/24/17 1046 06/25/17 1359  BNP  --  300.1*  PROBNP 3,404*  --      DDimer  No results for input(s): DDIMER in the last 168 hours.   Radiology    No results found.  Cardiac Studies    Echo 06/27/2017: - Left ventricle: The cavity size was normal. Wall thickness was   increased in a pattern of mild LVH. Systolic function was normal.   The estimated ejection fraction was in the range of 60% to 65%.   Wall motion was normal; there were no regional wall motion   abnormalities. The study is not technically sufficient to allow   evaluation of LV diastolic function. - Mitral valve: Calcified annulus. Mildly thickened leaflets .   There was mild regurgitation. Valve area by continuity equation   (using LVOT flow): 2.07 cm^2. - Left atrium: The atrium was mildly dilated. - Atrial septum: No defect or patent foramen ovale was identified. - Pulmonary arteries: PA peak pressure: 31 mm Hg (S).  Patient Profile     62 y.o. female with paroxysmal atrial fibrillation, morbid obesity, asthma and fibromyalgia admitted 06/25/2017 with atrial fibrillation with RVR.    Assessment & Plan    1. Paroxysmal atrial fibrillation: Christie Atkinson remains in atrial fibrillation. Her rates are ranging from the 70s-90s. Now on oral diltiazem 90 mg q8 hours. Feeling weak, tired, and dizzy when up this am. Will check orthostatics. Echo shows EF 60-65%, unable to evaluate for diastolic dysfunction.   She prefers to wait three weeks and undergo elective cardioversion to avoid TEE.  Continue Eliquis. Patient was diagnosed with atrial fibrillation in 2016 but hasn't been on anticoagulation until 06/24/17. Thyroid function was normal 06/2017. Consider outpatient sleep study.    2. Morbid obesity: Will need to further discuss weight loss options. This will limit A fib management.  3. Acute heart failure, type unknown: No ischemic workup in past. Echo results as above (EF 60-65%).  Mild pulmonary congestion noted on CXR 1/19 and BNP was elevated to 300. Not on home diuretics - getting 40 mg IV lasix BID. Likely switch lasix to oral today vs hold pending orthostatics. No urine output charted. Weight is down 17 lbs from admission. Will check orthostatics- pending. She already received lasix this am.   4. Hypokalemia - K 3.3 this am. Will supplement. Monitor daily BMP with diuresis.    For questions or updates, please contact [place] Please consult [email or web address] for contact info under Cardiology/STEMI.      Signed, Georgiana Shore, NP  06/28/2017, 10:02 AM     Attending Addendum:  History and all data above reviewed.  Patient examined.  I agree with the findings as above.   All available labs, radiology testing, previous records reviewed. Agree with documented assessment and plan. Christie Atkinson is a 19F with with paroxysmal atrial fibrillation, morbid obesity, asthma and fibromyalgia here with atrial fibrillation with RVR.  She remains in atrial fibrillation and rates are much better-controlled.  Overnight she had 2.3 second pauses.  She would benefit from an outpatient sleep  study.  Echo showed LVEF 60-65%.  We will consolidate diltiazem to 240mg  today.  Hold furosemide as her BP is low and she is euvolemic.  Will start lasix 40mg  po daily tomorrow.  Home tomorrow if heart rate and BP remain stable.       Christie Braud C. Oval Linsey, MD, [facility]  06/28/2017 12:15 PM

## 2017-06-28 NOTE — Addendum Note (Signed)
Addended by: Briant Cedar on: 06/28/2017 12:14 PM   Modules accepted: Orders

## 2017-06-29 DIAGNOSIS — J45909 Unspecified asthma, uncomplicated: Secondary | ICD-10-CM

## 2017-06-29 DIAGNOSIS — M797 Fibromyalgia: Secondary | ICD-10-CM

## 2017-06-29 HISTORY — DX: Morbid (severe) obesity due to excess calories: E66.01

## 2017-06-29 LAB — BASIC METABOLIC PANEL
ANION GAP: 11 (ref 5–15)
BUN: 10 mg/dL (ref 6–20)
CO2: 25 mmol/L (ref 22–32)
Calcium: 8.7 mg/dL — ABNORMAL LOW (ref 8.9–10.3)
Chloride: 100 mmol/L — ABNORMAL LOW (ref 101–111)
Creatinine, Ser: 0.89 mg/dL (ref 0.44–1.00)
GFR calc Af Amer: >60 mL/min (ref >60)
GFR calc non Af Amer: >60 mL/min (ref >60)
GLUCOSE: 113 mg/dL — AB (ref 65–99)
Potassium: 3.8 mmol/L (ref 3.5–5.1)
Sodium: 136 mmol/L (ref 135–145)

## 2017-06-29 MED ORDER — FUROSEMIDE 40 MG PO TABS: 40.0000 mg | ORAL_TABLET | Freq: Every day | ORAL | 3 refills | Status: DC

## 2017-06-29 MED ORDER — DILTIAZEM HCL ER COATED BEADS 300 MG PO CP24: 300.0000 mg | ORAL_CAPSULE | Freq: Every day | ORAL | 3 refills | Status: DC

## 2017-06-29 MED ORDER — POTASSIUM CHLORIDE CRYS ER 20 MEQ PO TBCR: 20.0000 meq | EXTENDED_RELEASE_TABLET | Freq: Every day | ORAL | Status: DC

## 2017-06-29 MED ORDER — DILTIAZEM HCL ER COATED BEADS 180 MG PO CP24: 300.0000 mg | ORAL_CAPSULE | Freq: Every day | ORAL | Status: DC

## 2017-06-29 MED ORDER — POTASSIUM CHLORIDE CRYS ER 20 MEQ PO TBCR: 20.0000 meq | EXTENDED_RELEASE_TABLET | Freq: Every day | ORAL | 3 refills | Status: DC

## 2017-06-29 NOTE — Progress Notes (Addendum)
Progress Note  Patient Name: Christie Atkinson Date of Encounter: 06/29/2017  Primary Cardiologist: No primary care provider on file.   Subjective   Feeling better this morning. Feels like she has some fluid coming back in her left shin. No CP or SOB. She wonders if she should go ahead and do the DCCV, but wants to get Christie. Blenda Atkinson opinion. Her family is concerned about her coming home in A fib.   Inpatient Medications    Scheduled Meds: . apixaban  5 mg Oral BID  . diltiazem  240 mg Oral Daily  . furosemide  40 mg Oral Daily  . potassium chloride  20 mEq Oral BID   Continuous Infusions:  PRN Meds: acetaminophen   Vital Signs    Vitals:   06/29/17 0830 06/29/17 1039 06/29/17 1210 06/29/17 1257  BP:  (!) 116/95 127/87   Pulse: (!) 115 (!) 54  81  Resp:  16  18  Temp:  98.1 F (36.7 C)  98.3 F (36.8 C)  TempSrc:  Oral  Oral  SpO2:    97%  Weight:      Height:        Intake/Output Summary (Last 24 hours) at 06/29/2017 1314 Last data filed at 06/29/2017 1302 Gross per 24 hour  Intake 720 ml  Output 1400 ml  Net -680 ml   Filed Weights   06/27/17 0519 06/28/17 0426 06/29/17 0629  Weight: 250 lb 9.6 oz (113.7 kg) 247 lb 11.2 oz (112.4 kg) 249 lb 3.2 oz (113 kg)    Telemetry   A fib 90-120s mostly - Personally Reviewed  ECG    No new tracings.  Physical Exam   General: Well appearing. Obese. No resp difficulty. HEENT: Normal Neck: Supple. Carotids 2+ bilat; no bruits. No thyromegaly or nodule noted. Cor: PMI nondisplaced. RRR, No M/G/R noted Lungs: CTAB, normal effort. Abdomen: Soft, non-tender, non-distended, no HSM. No bruits or masses. +BS  Extremities: No cyanosis, clubbing, or rash. R and LLE no edema.  Neuro: Alert & orientedx3, cranial nerves grossly intact. moves all 4 extremities w/o difficulty. Affect pleasant  VS:  BP 127/87 (BP Location: Right Arm)   Pulse 81   Temp 98.3 F (36.8 C) (Oral)   Resp 18   Ht 5\' 6"  (1.676 m)   Wt 249  lb 3.2 oz (113 kg)   SpO2 97%   BMI 40.22 kg/m  , BMI Body mass index is 40.22 kg/m.   Labs    Chemistry Recent Labs  Lab 06/26/17 0517 06/28/17 0249 06/29/17 0414  NA 139 137 136  K 4.2 3.3* 3.8  CL 105 100* 100*  CO2 23 25 25   GLUCOSE 147* 109* 113*  BUN 10 11 10   CREATININE 1.00 0.99 0.89  CALCIUM 8.7* 8.8* 8.7*  GFRNONAA 59* 60* >60  GFRAA >60 >60 >60  ANIONGAP 11 12 11      Hematology Recent Labs  Lab 06/24/17 1046 06/25/17 1359 06/26/17 0517  WBC 9.6 7.7 8.6  RBC 4.31 4.16 3.99  HGB 13.4 12.8 12.2  HCT 39.1 39.1 37.2  MCV 91 94.0 93.2  MCH 31.1 30.8 30.6  MCHC 34.3 32.7 32.8  RDW 12.9 13.2 13.6  PLT 247 218 232    Cardiac EnzymesNo results for input(s): TROPONINI in the last 168 hours.  Recent Labs  Lab 06/25/17 1404  TROPIPOC 0.00     BNP Recent Labs  Lab 06/24/17 1046 06/25/17 1359  BNP  --  300.1*  PROBNP  3,404*  --      DDimer No results for input(s): DDIMER in the last 168 hours.   Radiology    No results found.  Cardiac Studies    Echo 06/27/2017: - Left ventricle: The cavity size was normal. Wall thickness was   increased in a pattern of mild LVH. Systolic function was normal.   The estimated ejection fraction was in the range of 60% to 65%.   Wall motion was normal; there were no regional wall motion   abnormalities. The study is not technically sufficient to allow   evaluation of LV diastolic function. - Mitral valve: Calcified annulus. Mildly thickened leaflets .   There was mild regurgitation. Valve area by continuity equation   (using LVOT flow): 2.07 cm^2. - Left atrium: The atrium was mildly dilated. - Atrial septum: No defect or patent foramen ovale was identified. - Pulmonary arteries: PA peak pressure: 31 mm Hg (S).  Patient Profile     62 y.o. female with paroxysmal atrial fibrillation, morbid obesity, asthma and fibromyalgia admitted 06/25/2017 with atrial fibrillation with RVR.   Assessment & Plan    1.  Paroxysmal atrial fibrillation:  Patient was diagnosed with atrial fibrillation in 2016 but hasn't been on anticoagulation until 06/24/17. Thyroid function was normal 06/2017.  - Transitioned to once daily diltiazem 240 mg yesterday. HR generally 80-110's on tele, but she dips as low as 43. Still in A fib. SBP 110-120's.  - Continue eliquis. - She is interested is possible DCCV with TEE this admission, but wants to talk to Christie Atkinson about it. Her family is concerned about her coming up in A fib.  - Has had 2.3 sec pauses overnight during admission. Consider outpatient sleep study.  2. Morbid obesity: Will need to further discuss weight loss options. This will limit A fib management.  3. Acute heart failure, type unknown: No ischemic workup in past. Echo results as above (EF 60-65%).  Mild pulmonary congestion noted on CXR 1/19 and BNP was elevated to 300.  - Switched to 40 mg PO lasix today from 40 mg IV BID. Held pm dose of IV lasix yesterday due to dizziness. Not on home diuretics. Likely fluid overload related to A fib.  - Up 2 lbs from yesterday, Down 15 lbs since admission. Net negative 2.2 liters yesterday.  4. Hypokalemia - Resolved. K 3.8 this am. Will decrease scheduled supplement now with less diuresis. Monitor daily BMP.    For questions or updates, please contact [place] Please consult [email or web address] for contact info under Cardiology/STEMI.      Signed, Christie Shore, NP  06/29/2017, 1:14 PM     Attending Addendum:   History and all data above reviewed.  Patient examined.  I agree with the findings as above. All available labs, radiology testing, previous records reviewed. Agree with documented assessment and plan. Christie Atkinson is a 66F with with paroxysmal atrial fibrillation, morbid obesity, asthma and fibromyalgia here with atrial fibrillation with RVR. She remains in atrial fibrillation.  Rates are better-controlled but elevated at times.  Diltiazem was consolidated to  240mg  today.  We will increase that to 300mg  daily.  She is euvolemic.  Even though her heart rate is better controlled she continues to feel poorly.  She would prefer to move forward with TEE/DCCV rather than waiting 3 weeks for DCCV.  We will discharge her home today with plans for her to return 2/25 for TEE/DCCV.   Maddux Vanscyoc C. Oval Linsey, MD, [facility]  06/29/2017  3:09 PM

## 2017-06-29 NOTE — Care Management Important Message (Signed)
Important Message  Patient Details  Name: Christie Atkinson MRN: [id number] Date of Birth: 09-06-1955   Medicare Important Message Given:  Yes    Orbie Pyo 06/29/2017, 12:29 PM

## 2017-06-29 NOTE — H&P (View-Only) (Signed)
Progress Note  Patient Name: Christie Atkinson Date of Encounter: 06/29/2017  Primary Cardiologist: No primary care provider on file.   Subjective   Feeling better this morning. Feels like she has some fluid coming back in her left shin. No CP or SOB. She wonders if she should go ahead and do the DCCV, but wants to get Dr. Blenda Mounts opinion. Her family is concerned about her coming home in A fib.   Inpatient Medications    Scheduled Meds: . apixaban  5 mg Oral BID  . diltiazem  240 mg Oral Daily  . furosemide  40 mg Oral Daily  . potassium chloride  20 mEq Oral BID   Continuous Infusions:  PRN Meds: acetaminophen   Vital Signs    Vitals:   06/29/17 0830 06/29/17 1039 06/29/17 1210 06/29/17 1257  BP:  (!) 116/95 127/87   Pulse: (!) 115 (!) 54  81  Resp:  16  18  Temp:  98.1 F (36.7 C)  98.3 F (36.8 C)  TempSrc:  Oral  Oral  SpO2:    97%  Weight:      Height:        Intake/Output Summary (Last 24 hours) at 06/29/2017 1314 Last data filed at 06/29/2017 1302 Gross per 24 hour  Intake 720 ml  Output 1400 ml  Net -680 ml   Filed Weights   06/27/17 0519 06/28/17 0426 06/29/17 0629  Weight: 250 lb 9.6 oz (113.7 kg) 247 lb 11.2 oz (112.4 kg) 249 lb 3.2 oz (113 kg)    Telemetry   A fib 90-120s mostly - Personally Reviewed  ECG    No new tracings.  Physical Exam   General: Well appearing. Obese. No resp difficulty. HEENT: Normal Neck: Supple. Carotids 2+ bilat; no bruits. No thyromegaly or nodule noted. Cor: PMI nondisplaced. RRR, No M/G/R noted Lungs: CTAB, normal effort. Abdomen: Soft, non-tender, non-distended, no HSM. No bruits or masses. +BS  Extremities: No cyanosis, clubbing, or rash. R and LLE no edema.  Neuro: Alert & orientedx3, cranial nerves grossly intact. moves all 4 extremities w/o difficulty. Affect pleasant  VS:  BP 127/87 (BP Location: Right Arm)   Pulse 81   Temp 98.3 F (36.8 C) (Oral)   Resp 18   Ht 5\' 6"  (1.676 m)   Wt 249  lb 3.2 oz (113 kg)   SpO2 97%   BMI 40.22 kg/m  , BMI Body mass index is 40.22 kg/m.   Labs    Chemistry Recent Labs  Lab 06/26/17 0517 06/28/17 0249 06/29/17 0414  NA 139 137 136  K 4.2 3.3* 3.8  CL 105 100* 100*  CO2 23 25 25   GLUCOSE 147* 109* 113*  BUN 10 11 10   CREATININE 1.00 0.99 0.89  CALCIUM 8.7* 8.8* 8.7*  GFRNONAA 59* 60* >60  GFRAA >60 >60 >60  ANIONGAP 11 12 11      Hematology Recent Labs  Lab 06/24/17 1046 06/25/17 1359 06/26/17 0517  WBC 9.6 7.7 8.6  RBC 4.31 4.16 3.99  HGB 13.4 12.8 12.2  HCT 39.1 39.1 37.2  MCV 91 94.0 93.2  MCH 31.1 30.8 30.6  MCHC 34.3 32.7 32.8  RDW 12.9 13.2 13.6  PLT 247 218 232    Cardiac EnzymesNo results for input(s): TROPONINI in the last 168 hours.  Recent Labs  Lab 06/25/17 1404  TROPIPOC 0.00     BNP Recent Labs  Lab 06/24/17 1046 06/25/17 1359  BNP  --  300.1*  PROBNP  3,404*  --      DDimer No results for input(s): DDIMER in the last 168 hours.   Radiology    No results found.  Cardiac Studies    Echo 06/27/2017: - Left ventricle: The cavity size was normal. Wall thickness was   increased in a pattern of mild LVH. Systolic function was normal.   The estimated ejection fraction was in the range of 60% to 65%.   Wall motion was normal; there were no regional wall motion   abnormalities. The study is not technically sufficient to allow   evaluation of LV diastolic function. - Mitral valve: Calcified annulus. Mildly thickened leaflets .   There was mild regurgitation. Valve area by continuity equation   (using LVOT flow): 2.07 cm^2. - Left atrium: The atrium was mildly dilated. - Atrial septum: No defect or patent foramen ovale was identified. - Pulmonary arteries: PA peak pressure: 31 mm Hg (S).  Patient Profile     62 y.o. female with paroxysmal atrial fibrillation, morbid obesity, asthma and fibromyalgia admitted 06/25/2017 with atrial fibrillation with RVR.   Assessment & Plan    1.  Paroxysmal atrial fibrillation:  Patient was diagnosed with atrial fibrillation in 2016 but hasn't been on anticoagulation until 06/24/17. Thyroid function was normal 06/2017.  - Transitioned to once daily diltiazem 240 mg yesterday. HR generally 80-110's on tele, but she dips as low as 43. Still in A fib. SBP 110-120's.  - Continue eliquis. - She is interested is possible DCCV with TEE this admission, but wants to talk to Dr Oval Linsey about it. Her family is concerned about her coming up in A fib.  - Has had 2.3 sec pauses overnight during admission. Consider outpatient sleep study.  2. Morbid obesity: Will need to further discuss weight loss options. This will limit A fib management.  3. Acute heart failure, type unknown: No ischemic workup in past. Echo results as above (EF 60-65%).  Mild pulmonary congestion noted on CXR 1/19 and BNP was elevated to 300.  - Switched to 40 mg PO lasix today from 40 mg IV BID. Held pm dose of IV lasix yesterday due to dizziness. Not on home diuretics. Likely fluid overload related to A fib.  - Up 2 lbs from yesterday, Down 15 lbs since admission. Net negative 2.2 liters yesterday.  4. Hypokalemia - Resolved. K 3.8 this am. Will decrease scheduled supplement now with less diuresis. Monitor daily BMP.    For questions or updates, please contact [place] Please consult [email or web address] for contact info under Cardiology/STEMI.      Signed, Georgiana Shore, NP  06/29/2017, 1:14 PM     Attending Addendum:   History and all data above reviewed.  Patient examined.  I agree with the findings as above. All available labs, radiology testing, previous records reviewed. Agree with documented assessment and plan. Christie Atkinson is a 33F with with paroxysmal atrial fibrillation, morbid obesity, asthma and fibromyalgia here with atrial fibrillation with RVR. She remains in atrial fibrillation.  Rates are better-controlled but elevated at times.  Diltiazem was consolidated to  240mg  today.  We will increase that to 300mg  daily.  She is euvolemic.  Even though her heart rate is better controlled she continues to feel poorly.  She would prefer to move forward with TEE/DCCV rather than waiting 3 weeks for DCCV.  We will discharge her home today with plans for her to return 2/25 for TEE/DCCV.   Tiffany C. Oval Linsey, MD, [facility]  06/29/2017  3:09 PM

## 2017-06-29 NOTE — Discharge Summary (Signed)
Discharge Summary    Patient ID: Christie Atkinson,  MRN: [id number], DOB/AGE: [birth year] 52 y.o.  Admit date: 06/25/2017 Discharge date: 06/29/2017  Primary Care Provider: Patient, No Pcp Per Primary Cardiologist: Sinclair Grooms, MD  Discharge Diagnoses    Principal Problem:   Atrial fibrillation with RVR [facility]) Active Problems:   Congestive heart failure ([place])   Morbid obesity ([place])   Asthma   Fibromyalgia   Allergies Allergies  Allergen Reactions  . Other Other (See Comments)    Metals Polyester - including hospital gowns and sheets - allergic contact dermatitis  . Sulfa Antibiotics Hives  . Sulfur Hives    Diagnostic Studies/Procedures   Echo 06/27/17:  Study Conclusions - Left ventricle: The cavity size was normal. Wall thickness was   increased in a pattern of mild LVH. Systolic function was normal.   The estimated ejection fraction was in the range of 60% to 65%.   Wall motion was normal; there were no regional wall motion   abnormalities. The study is not technically sufficient to allow   evaluation of LV diastolic function. - Mitral valve: Calcified annulus. Mildly thickened leaflets .   There was mild regurgitation. Valve area by continuity equation   (using LVOT flow): 2.07 cm^2. - Left atrium: The atrium was mildly dilated. - Atrial septum: No defect or patent foramen ovale was identified. - Pulmonary arteries: PA peak pressure: 31 mm Hg (S).   History of Present Illness     Christie Atkinson is a 62 y.o. female with a history of AF, heart failure, fibromyalgia who presents with AFRVR and heart failure exacerbation.   The patient was diagnosed with AF during a hospitalization in 02/2015 for laparoscopic cholecystectomy. She was discharged on oral diltiazem and ASA 81. She was not evaluated by cardiology again until 06/24/17, when she was seen in clinic and reported one month of exertional dyspnea and palpitations. At that visit, she was in AF with HR  130s with elevated BP. She reported at that visit that she never started her diltiazem. At that visit, her diltiazem was restarted and apixaban was initiated with plans for outpatient follow up for possible DCCV. Lab were checked and later revealed pro-BNP >3000. The patient called back to clinic and reported worsening dyspnea, and she was instructed to come to the ED.   In the ED, the patient was in AF with HR up to the 140s. ECG showed AF with PVCs and nonspecific ST changes. Labs showed normal BMP and CBC with negative troponin and elevated BNP of 300. She was given a dose of IV furosemide and was started on a diltiazem infusion. Cardiology was consulted for admission.  On my evaluation, the patient describes her progressive dyspnea with associated PND and orthopnea. She denies chest pain. She does endorse edema in her abdomen >legs.   Hospital Course     Consultants: none  She was admitted to cardiology with acute on chronic congestive heart failure, type unknown, and atrial fibrillation with RVR, likely secondary to heart failure exacerbation.   Acute heart failure, type unknown CXR with mild pulmonary congestion. BNP was elevated to 300 on admission.  Echo with normal EF.  No ischemic workup in past.  She was not on a home diuretic regimen. She was diuresed with 40 mg IV lasix BID. She was transitioned to oral lasix today.  Weight is down 17 lbs from admission - discharge weight is 249 lbs. Orthostatics vitals negative for hypotension.  She was euvolemic on exam. She discharged on 40 mg lasix daily and was instructed to weigh daily and keep a log.   Atrial fibrillation with RVR Felt to be driven by her CHF exacerbation. She was started on diltiazem drip with some improvement in heart rate. She was transitioned to PO diltiazem. Echo with normal EF. Rates ranging 70-90s on oral diltiazem 90 mg q8hr. She was transitioned to cardizem 240 mg daily. HR in the 80-110s on this dose, but she continues  to feel poorly. Diltiazem was increased to 300 mg daily and we will plan for outpatient TEE/DCCV on Monday 07/04/17.   Patient has been seen and examined by Dr. Oval Linsey today and is deemed stable for discharge. All follow up appointments have been scheduled and discharge medications are listed below.  _____________  Discharge Vitals Blood pressure 127/87, pulse 81, temperature 98.3 F (36.8 C), temperature source Oral, resp. rate 18, height 5\' 6"  (1.676 m), weight 249 lb 3.2 oz (113 kg), SpO2 97 %.  Filed Weights   06/27/17 0519 06/28/17 0426 06/29/17 0629  Weight: 250 lb 9.6 oz (113.7 kg) 247 lb 11.2 oz (112.4 kg) 249 lb 3.2 oz (113 kg)    Labs & Radiologic Studies    CBC No results for input(s): WBC, NEUTROABS, HGB, HCT, MCV, PLT in the last 72 hours. Basic Metabolic Panel Recent Labs    06/28/17 0249 06/29/17 0414  NA 137 136  K 3.3* 3.8  CL 100* 100*  CO2 25 25  GLUCOSE 109* 113*  BUN 11 10  CREATININE 0.99 0.89  CALCIUM 8.8* 8.7*  MG 2.2  --    Liver Function Tests No results for input(s): AST, ALT, ALKPHOS, BILITOT, PROT, ALBUMIN in the last 72 hours. No results for input(s): LIPASE, AMYLASE in the last 72 hours. Cardiac Enzymes No results for input(s): CKTOTAL, CKMB, CKMBINDEX, TROPONINI in the last 72 hours. BNP Invalid input(s): POCBNP D-Dimer No results for input(s): DDIMER in the last 72 hours. Hemoglobin A1C No results for input(s): HGBA1C in the last 72 hours. Fasting Lipid Panel No results for input(s): CHOL, HDL, LDLCALC, TRIG, CHOLHDL, LDLDIRECT in the last 72 hours. Thyroid Function Tests No results for input(s): TSH, T4TOTAL, T3FREE, THYROIDAB in the last 72 hours.  Invalid input(s): FREET3 _____________  Dg Chest 2 View  Result Date: 06/25/2017 CLINICAL DATA:  Increasing shortness of breath. AFib. Elevated BNP. EXAM: CHEST  2 VIEW COMPARISON:  08/23/2003 FINDINGS: The cardiac silhouette is mildly enlarged. Mediastinal contours appear intact.  Tortuosity of the aorta. There is no evidence pneumothorax. Right pleural effusion versus pleural thickening with airspace consolidation or pulmonary nodule in the right costophrenic angle. Mildly increased interstitial markings. Osseous structures are without acute abnormality. Soft tissues are grossly normal. IMPRESSION: Enlarged cardiac silhouette. Mild pulmonary vascular congestion. Right pleural effusion versus pleural thickening with airspace consolidation versus pulmonary nodule in the right costophrenic angle. Electronically Signed   By: Fidela Salisbury M.D.   On: 06/25/2017 15:07   Disposition   Pt is being discharged home today in good condition.  Follow-up Plans & Appointments     Discharge Instructions    Diet - low sodium heart healthy   Complete by:  As directed    Discharge instructions   Complete by:  As directed    Report to [facility] on Monday 07/04/17 at 7:45 AM for transesophageal echocardiogram and direct current cardioversion at 9:00AM with Dr. Johnsie Cancel. Please do not eat or drink anything after midnight Sunday night. Take  all medications, including eliquis as directed that morning (may have sips of water with medications). You must have someone with you to drive you home.   Increase activity slowly   Complete by:  As directed       Discharge Medications   Allergies as of 06/29/2017      Reactions   Other Other (See Comments)   Metals Polyester - including hospital gowns and sheets - allergic contact dermatitis   Sulfa Antibiotics Hives   Sulfur Hives      Medication List    TAKE these medications   apixaban 5 MG Tabs tablet Commonly known as:  ELIQUIS Take 1 tablet (5 mg total) by mouth 2 (two) times daily.   diltiazem 300 MG 24 hr capsule Commonly known as:  CARDIZEM CD Take 1 capsule (300 mg total) by mouth daily. Start taking on:  06/30/2017 What changed:    medication strength  how much to take   furosemide 40 MG tablet Commonly known as:   LASIX Take 1 tablet (40 mg total) by mouth daily. Start taking on:  06/30/2017   HYALURONIC ACID PO Take 1 tablet by mouth 2 (two) times a week.   multivitamin with minerals Tabs tablet Take 1 tablet by mouth daily as needed (when not eating properly).   OVER THE COUNTER MEDICATION Take 250 mg by mouth daily as needed (cold symptoms). "Relora" herbal supplement   OVER THE COUNTER MEDICATION Take 3 drops by mouth daily as needed (pain). CBD oil   potassium chloride SA 20 MEQ tablet Commonly known as:  K-DUR,KLOR-CON Take 1 tablet (20 mEq total) by mouth daily. Start taking on:  06/30/2017         Outstanding Labs/Studies   TEE/DCCV scheduled on:  07/04/17  Follow up in clinic  Consider sleep study  Duration of Discharge Encounter   Greater than 30 minutes including physician time.  Signed, Tami Lin Duke NP 06/29/2017, 4:26 PM

## 2017-07-03 NOTE — Anesthesia Preprocedure Evaluation (Addendum)
Anesthesia Evaluation  Patient identified by MRN, date of birth, ID band Patient awake    Reviewed: Allergy & Precautions, NPO status , Patient's Chart, lab work & pertinent test results  History of Anesthesia Complications Negative for: history of anesthetic complications  Airway Mallampati: II  TM Distance: >3 FB Neck ROM: Full    Dental  (+) Dental Advisory Given   Pulmonary asthma , sleep apnea ,    breath sounds clear to auscultation       Cardiovascular (-) angina+ dysrhythmias (new onset ) Atrial Fibrillation  Rhythm:Irregular Rate:Normal  02/23/15 ECHO: EF 55-60%, valves OK   Neuro/Psych negative neurological ROS  negative psych ROS   GI/Hepatic negative GI ROS, Neg liver ROS,   Endo/Other  Morbid obesity  Renal/GU negative Renal ROS     Musculoskeletal  (+) Fibromyalgia -  Abdominal (+) + obese,   Peds  Hematology negative hematology ROS (+)   Anesthesia Other Findings   Reproductive/Obstetrics                            Anesthesia Physical  Anesthesia Plan  ASA: III  Anesthesia Plan: General   Post-op Pain Management:    Induction: Intravenous  PONV Risk Score and Plan: 3 and Propofol infusion and Treatment may vary due to age or medical condition  Airway Management Planned: Natural Airway  Additional Equipment:   Intra-op Plan:   Post-operative Plan:   Informed Consent: I have reviewed the patients History and Physical, chart, labs and discussed the procedure including the risks, benefits and alternatives for the proposed anesthesia with the patient or authorized representative who has indicated his/her understanding and acceptance.   Dental advisory given  Plan Discussed with: CRNA  Anesthesia Plan Comments:         Anesthesia Quick Evaluation

## 2017-07-04 ENCOUNTER — Encounter (HOSPITAL_COMMUNITY)
Admission: RE | Disposition: A | Discharge status: Home / Self Care | Payer: Self-pay | Source: Ambulatory Visit | Attending: Cardiovascular Disease

## 2017-07-04 ENCOUNTER — Ambulatory Visit (HOSPITAL_COMMUNITY)
Admission: RE | Admit: 2017-07-04 | Discharge: 2017-07-04 | Disposition: A | Discharge status: Home / Self Care | Payer: Medicare Other | Source: Ambulatory Visit | Attending: Cardiovascular Disease | Admitting: Cardiovascular Disease

## 2017-07-04 ENCOUNTER — Encounter: Payer: Self-pay | Admitting: *Deleted

## 2017-07-04 ENCOUNTER — Ambulatory Visit (HOSPITAL_BASED_OUTPATIENT_CLINIC_OR_DEPARTMENT_OTHER): Payer: Medicare Other

## 2017-07-04 ENCOUNTER — Ambulatory Visit (HOSPITAL_COMMUNITY): Payer: Medicare Other | Admitting: Anesthesiology

## 2017-07-04 ENCOUNTER — Encounter (HOSPITAL_COMMUNITY): Payer: Self-pay | Admitting: Certified Registered"

## 2017-07-04 DIAGNOSIS — I509 Heart failure, unspecified: Secondary | ICD-10-CM | POA: Diagnosis not present

## 2017-07-04 DIAGNOSIS — G4730 Sleep apnea, unspecified: Secondary | ICD-10-CM | POA: Insufficient documentation

## 2017-07-04 DIAGNOSIS — M797 Fibromyalgia: Secondary | ICD-10-CM | POA: Diagnosis not present

## 2017-07-04 DIAGNOSIS — E876 Hypokalemia: Secondary | ICD-10-CM | POA: Insufficient documentation

## 2017-07-04 DIAGNOSIS — Z79899 Other long term (current) drug therapy: Secondary | ICD-10-CM | POA: Diagnosis not present

## 2017-07-04 DIAGNOSIS — Z6839 Body mass index (BMI) 39.0-39.9, adult: Secondary | ICD-10-CM | POA: Diagnosis not present

## 2017-07-04 DIAGNOSIS — Z882 Allergy status to sulfonamides status: Secondary | ICD-10-CM | POA: Diagnosis not present

## 2017-07-04 DIAGNOSIS — I481 Persistent atrial fibrillation: Secondary | ICD-10-CM

## 2017-07-04 DIAGNOSIS — J45909 Unspecified asthma, uncomplicated: Secondary | ICD-10-CM | POA: Diagnosis not present

## 2017-07-04 DIAGNOSIS — I340 Nonrheumatic mitral (valve) insufficiency: Secondary | ICD-10-CM | POA: Diagnosis not present

## 2017-07-04 DIAGNOSIS — R0989 Other specified symptoms and signs involving the circulatory and respiratory systems: Secondary | ICD-10-CM | POA: Insufficient documentation

## 2017-07-04 DIAGNOSIS — I480 Paroxysmal atrial fibrillation: Secondary | ICD-10-CM | POA: Diagnosis not present

## 2017-07-04 DIAGNOSIS — Z7901 Long term (current) use of anticoagulants: Secondary | ICD-10-CM | POA: Diagnosis not present

## 2017-07-04 DIAGNOSIS — I4891 Unspecified atrial fibrillation: Secondary | ICD-10-CM | POA: Diagnosis not present

## 2017-07-04 DIAGNOSIS — I4819 Other persistent atrial fibrillation: Secondary | ICD-10-CM

## 2017-07-04 DIAGNOSIS — Q211 Atrial septal defect: Secondary | ICD-10-CM | POA: Insufficient documentation

## 2017-07-04 HISTORY — PX: CARDIOVERSION: SHX1299

## 2017-07-04 HISTORY — PX: TEE WITHOUT CARDIOVERSION: SHX5443

## 2017-07-04 HISTORY — DX: Sleep apnea, unspecified: G47.30

## 2017-07-04 LAB — BASIC METABOLIC PANEL
ANION GAP: 10 (ref 5–15)
BUN: 12 mg/dL (ref 6–20)
CALCIUM: 9 mg/dL (ref 8.9–10.3)
CO2: 22 mmol/L (ref 22–32)
Chloride: 107 mmol/L (ref 101–111)
Creatinine, Ser: 0.89 mg/dL (ref 0.44–1.00)
GFR calc Af Amer: >60 mL/min (ref >60)
Glucose, Bld: 116 mg/dL — ABNORMAL HIGH (ref 65–99)
POTASSIUM: 4 mmol/L (ref 3.5–5.1)
SODIUM: 139 mmol/L (ref 135–145)

## 2017-07-04 LAB — MAGNESIUM: MAGNESIUM: 2.1 mg/dL (ref 1.7–2.4)

## 2017-07-04 SURGERY — CARDIOVERSION
Anesthesia: Monitor Anesthesia Care

## 2017-07-04 MED ORDER — BUTAMBEN-TETRACAINE-BENZOCAINE 2-2-14 % EX AERO
INHALATION_SPRAY | CUTANEOUS | Status: DC | PRN
  Administered 2017-07-04: 2 via TOPICAL

## 2017-07-04 MED ORDER — LIDOCAINE 2% (20 MG/ML) 5 ML SYRINGE
INTRAMUSCULAR | Status: DC | PRN
  Administered 2017-07-04: 60 mg via INTRAVENOUS

## 2017-07-04 MED ORDER — SODIUM CHLORIDE 0.9 % IV SOLN: INTRAVENOUS | Status: DC

## 2017-07-04 MED ORDER — PROPOFOL 500 MG/50ML IV EMUL
INTRAVENOUS | Status: DC | PRN
  Administered 2017-07-04: 100 ug/kg/min via INTRAVENOUS

## 2017-07-04 MED ORDER — PROPOFOL 10 MG/ML IV BOLUS
INTRAVENOUS | Status: DC | PRN
  Administered 2017-07-04 (×2): 40 mg via INTRAVENOUS

## 2017-07-04 MED ORDER — HYDROCORTISONE 1 % EX CREA: 1.0000 "application " | TOPICAL_CREAM | Freq: Three times a day (TID) | CUTANEOUS | Status: DC | PRN

## 2017-07-04 MED ORDER — LACTATED RINGERS IV SOLN
INTRAVENOUS | Status: DC | PRN
  Administered 2017-07-04: 08:00:00 via INTRAVENOUS

## 2017-07-04 NOTE — Transfer of Care (Addendum)
Immediate Anesthesia Transfer of Care Note  Patient: Christie Atkinson  Procedure(s) Performed: CARDIOVERSION (N/A ) TRANSESOPHAGEAL ECHOCARDIOGRAM (TEE) (N/A )  Patient Location: Endoscopy Unit  Anesthesia Type:MAC  Level of Consciousness: drowsy  Airway & Oxygen Therapy: Patient Spontanous Breathing and Patient connected to nasal cannula oxygen  Post-op Assessment: Report given to RN and Post -op Vital signs reviewed and stable  Post vital signs: Reviewed and stable  Last Vitals:  Vitals:   07/04/17 0805  BP: (!) 145/90  Pulse: 100  Resp: 17  Temp: 36.8 C  SpO2: 97%    Last Pain:  Vitals:   07/04/17 0805  TempSrc: Oral         Complications: No apparent anesthesia complications

## 2017-07-04 NOTE — Interval H&P Note (Signed)
History and Physical Interval Note:  07/04/2017 8:13 AM  Christie Atkinson  has presented today for surgery, with the diagnosis of afib  The various methods of treatment have been discussed with the patient and family. After consideration of risks, benefits and other options for treatment, the patient has consented to  Procedure(s): CARDIOVERSION (N/A) TRANSESOPHAGEAL ECHOCARDIOGRAM (TEE) (N/A) as a surgical intervention .  The patient's history has been reviewed, patient examined, no change in status, stable for surgery.  I have reviewed the patient's chart and labs.  Questions were answered to the patient's satisfaction.     Jenkins Rouge

## 2017-07-04 NOTE — CV Procedure (Signed)
TEE:  EF 45-50% Mild MR No formed thrombus in LAA but dense spontaneous contrast Biatrial enlargement  PFO present No effusion Normal AV  DCC x 1 200 J Converted from afib rate 113 to NSR rate 80 No immediate neurologic sequelae On eliquis with no missed doses  Jenkins Rouge

## 2017-07-04 NOTE — Anesthesia Postprocedure Evaluation (Signed)
Anesthesia Post Note  Patient: Christie Atkinson  Procedure(s) Performed: CARDIOVERSION (N/A ) TRANSESOPHAGEAL ECHOCARDIOGRAM (TEE) (N/A )     Patient location during evaluation: PACU Anesthesia Type: General Level of consciousness: awake and alert Pain management: pain level controlled Vital Signs Assessment: post-procedure vital signs reviewed and stable Respiratory status: spontaneous breathing, nonlabored ventilation, respiratory function stable and patient connected to nasal cannula oxygen Cardiovascular status: blood pressure returned to baseline and stable Postop Assessment: no apparent nausea or vomiting Anesthetic complications: no    Last Vitals:  Vitals:   07/04/17 1005 07/04/17 1010  BP: 107/65 115/77  Pulse: 64 63  Resp: 13 13  Temp:    SpO2: 96% 95%    Last Pain:  Vitals:   07/04/17 0953  TempSrc: Oral                 Ryan P Ellender

## 2017-07-04 NOTE — Interval H&P Note (Signed)
History and Physical Interval Note:  07/04/2017 9:11 AM  Christie Atkinson  has presented today for surgery, with the diagnosis of afib  The various methods of treatment have been discussed with the patient and family. After consideration of risks, benefits and other options for treatment, the patient has consented to  Procedure(s): CARDIOVERSION (N/A) TRANSESOPHAGEAL ECHOCARDIOGRAM (TEE) (N/A) as a surgical intervention .  The patient's history has been reviewed, patient examined, no change in status, stable for surgery.  I have reviewed the patient's chart and labs.  Questions were answered to the patient's satisfaction.     Jenkins Rouge

## 2017-07-04 NOTE — Anesthesia Procedure Notes (Signed)
Procedure Name: General with mask airway Date/Time: 07/04/2017 9:37 AM Performed by: Imagene Riches, CRNA Pre-anesthesia Checklist: Patient identified, Emergency Drugs available, Suction available and Patient being monitored Patient Re-evaluated:Patient Re-evaluated prior to induction Oxygen Delivery Method: Nasal cannula Preoxygenation: Pre-oxygenation with 100% oxygen Induction Type: IV induction

## 2017-07-06 ENCOUNTER — Encounter (HOSPITAL_COMMUNITY): Payer: Self-pay | Admitting: Cardiovascular Disease

## 2017-07-08 ENCOUNTER — Telehealth: Payer: Self-pay | Admitting: Cardiovascular Disease

## 2017-07-08 DIAGNOSIS — I509 Heart failure, unspecified: Secondary | ICD-10-CM

## 2017-07-08 NOTE — Telephone Encounter (Signed)
Christie Atkinson is returning a call about her lab results . Please call

## 2017-07-11 ENCOUNTER — Ambulatory Visit: Payer: Medicare Other | Admitting: Nurse Practitioner

## 2017-07-11 NOTE — Telephone Encounter (Signed)
Called and made patient aware of lab results and recommendations. Patient states that she was in the hospital and that she had TEE/DCCV. Patient states that she is taking her lasix 40 mg QD and that she has continued to take Cardizem and Eliquis and has not missed any doses. Patient states that she is feeling better. Patient has an appointment with Lyda Jester, PA on 2/7 at 3:00 PM. Instructed patient to keep this appointment and we will draw her BNP, BMET at her appointment. Instructed the patient to let us know if her symptoms worsen before then. Patient verbalized understanding and thanked me for the call.

## 2017-07-11 NOTE — Telephone Encounter (Signed)
-----   Message from Consuelo Pandy, [place] sent at 06/27/2017  8:08 AM EST ----- Fluid marker is high. Acute CHF, in the setting of atrial fibrillation. Pt needs to start fluid pill to get rid of excess. Kidney function and K stable. Start Lasix 40 mg daily. This will make her urinate, but is need to get rid of fluid and help with symptoms. Continue Cardizem and Eliquis that was added on Friday. All other labs ok. Repeat BNP and BMP in 7-10 days. Keep f/u appt. If symptoms worsen, severe shortness of breath at rest, then go to ED.

## 2017-07-14 ENCOUNTER — Other Ambulatory Visit: Payer: Medicare Other | Admitting: *Deleted

## 2017-07-14 ENCOUNTER — Encounter: Payer: Self-pay | Admitting: Cardiology

## 2017-07-14 ENCOUNTER — Ambulatory Visit (INDEPENDENT_AMBULATORY_CARE_PROVIDER_SITE_OTHER): Payer: Medicare Other | Admitting: Cardiology

## 2017-07-14 VITALS — BP 108/80 | HR 102 | Ht 66.5 in | Wt 251.4 lb

## 2017-07-14 DIAGNOSIS — I509 Heart failure, unspecified: Secondary | ICD-10-CM

## 2017-07-14 DIAGNOSIS — I5032 Chronic diastolic (congestive) heart failure: Secondary | ICD-10-CM | POA: Diagnosis not present

## 2017-07-14 DIAGNOSIS — I4891 Unspecified atrial fibrillation: Secondary | ICD-10-CM | POA: Diagnosis not present

## 2017-07-14 NOTE — Patient Instructions (Signed)
Medication Instructions:  Your physician recommends that you continue on your current medications as directed. Please refer to the Current Medication list given to you today.  Labwork: None ordered   Testing/Procedures: None Ordered  Follow-Up: Your physician recommends that you schedule a follow-up appointment at the afib clinic   Any Other Special Instructions Will Be Listed Below (If Applicable). Your provider recommends that you weigh yourself daily using the same scale, usually first thing in the morning.     If you need a refill on your cardiac medications before your next appointment, please call your pharmacy.

## 2017-07-14 NOTE — Progress Notes (Signed)
07/14/2017 Christie Atkinson   1956/02/05  [id number]  Primary Physician Patient, No Pcp Per Primary Cardiologist: Dr. Tamala Julian   Reason for Visit/CC: [facility] f/u for Atrial Fibrillation  HPI: Christie Atkinson presents to clinic today for post hospital follow-up after recent admission for atrial fibrillation with RVR and acute on chronic diastolic heart failure.  She was first diagnosed with atrial fibrillation in 2016, which was discovered when she was admitted for symptomatic cholelithiasis and underwent laparoscopic cholecystectomy. However the patient was lost to follow-up in our office after that hospitalization.   She presented back with complaints of dyspnea in January of this year was noted to be in atrial fibrillation with RVR was also volume overloaded.  She was admitted to [facility] and treated with IV Lasix and diuresed.  Echocardiogram showed normal LV function.  She was placed on rate control therapy with Cardizem, initially IV but later transitioned to p.o. at a dose of 300 mg daily. TSH was normal.  She was also started on anticoagulation therapy with Eliquis, 5 mg twice daily.  On July 04, 2017 she underwent TEE guided direct current cardioversion which was initially successful w/ conversion back to NSR.  In follow-up today, she is here with her daughter.  EKG shows that she has reverted back into atrial fibrillation.  Ventricular rate is 102 bpm.  She is asymptomatic with her arrhythmia but she feels that she is starting to regain edema particularly in her abdomen.  Compared to her discharge weight of 249 pounds she is only gained 2 pounds.  Her weight today is 251 pounds.  She reports full medication compliance with her Lasix.  She was initially discharged home on 40 mg daily.  She has also been fully compliant with her Cardizem and as well as her Eliquis.  2D Echo 06/27/17 Study Conclusions  - Left ventricle: The cavity size was normal. Wall thickness was   increased  in a pattern of mild LVH. Systolic function was normal.   The estimated ejection fraction was in the range of 60% to 65%.   Wall motion was normal; there were no regional wall motion   abnormalities. The study is not technically sufficient to allow   evaluation of LV diastolic function. - Mitral valve: Calcified annulus. Mildly thickened leaflets .   There was mild regurgitation. Valve area by continuity equation   (using LVOT flow): 2.07 cm^2. - Left atrium: The atrium was mildly dilated. - Atrial septum: No defect or patent foramen ovale was identified. - Pulmonary arteries: PA peak pressure: 31 mm Hg (S).   Current Meds  Medication Sig  . apixaban (ELIQUIS) 5 MG TABS tablet Take 1 tablet (5 mg total) by mouth 2 (two) times daily.  [name] diltiazem (CARDIZEM CD) 300 MG 24 hr capsule Take 1 capsule (300 mg total) by mouth daily.  . furosemide (LASIX) 40 MG tablet Take 1 tablet (40 mg total) by mouth daily.  [name] Hyaluronic Acid-Vitamin C (HYALURONIC ACID PO) Take 1 tablet by mouth 2 (two) times a week.  . Multiple Vitamin (MULTIVITAMIN WITH MINERALS) TABS tablet Take 1 tablet by mouth daily as needed (when not eating properly).  [name] OVER THE COUNTER MEDICATION Take 250 mg by mouth daily as needed (cold symptoms). "Relora" herbal supplement  . OVER THE COUNTER MEDICATION Take 3 drops by mouth daily as needed (pain). CBD oil  . potassium chloride SA (K-DUR,KLOR-CON) 20 MEQ tablet Take 1 tablet (20 mEq total) by mouth daily.  Allergies  Allergen Reactions  . Other Other (See Comments)    Metals Polyester - including hospital gowns and sheets - allergic contact dermatitis  . Sulfa Antibiotics Hives  . Sulfur Hives   Past Medical History:  Diagnosis Date  . CHF (congestive heart failure) ([place])   . Fibromyalgia   . Gestational diabetes   . Heart murmur   . Sleep apnea    Family History  Problem Relation Age of Onset  . Heart disease Mother   . Hypertension Mother   . Hyperlipidemia  Mother   . Heart disease Father   . Heart attack Father   . Hypertension Father   . Hyperlipidemia Father    Past Surgical History:  Procedure Laterality Date  . ABDOMINAL HYSTERECTOMY    . APPENDECTOMY    . CARDIOVERSION N/A 07/04/2017   Procedure: CARDIOVERSION;  Surgeon: [name], MD;  Location: [facility] ENDOSCOPY;  Service: Cardiovascular;  Laterality: N/A;  . CHOLECYSTECTOMY N/A 02/24/2015   Procedure: LAPAROSCOPIC CHOLECYSTECTOMY;  Surgeon: [name], MD;  Location: [place];  Service: General;  Laterality: N/A;  . TEE WITHOUT CARDIOVERSION N/A 07/04/2017   Procedure: TRANSESOPHAGEAL ECHOCARDIOGRAM (TEE);  Surgeon: [name], MD;  Location: [facility] ENDOSCOPY;  Service: Cardiovascular;  Laterality: N/A;   Social History   Socioeconomic History  . Marital status: Married    Spouse name: Not on file  . Number of children: Not on file  . Years of education: Not on file  . Highest education level: Not on file  Social Needs  . Financial resource strain: Not on file  . Food insecurity - worry: Not on file  . Food insecurity - inability: Not on file  . Transportation needs - medical: Not on file  . Transportation needs - non-medical: Not on file  Occupational History  . Not on file  Tobacco Use  . Smoking status: Never Smoker  . Smokeless tobacco: Never Used  Substance and Sexual Activity  . Alcohol use: Yes    Comment: little  . Drug use: No  . Sexual activity: Not on file  Other Topics Concern  . Not on file  Social History Narrative  . Not on file     Review of Systems: General: negative for chills, fever, night sweats or weight changes.  Cardiovascular: negative for chest pain, dyspnea on exertion, edema, orthopnea, palpitations, paroxysmal nocturnal dyspnea or shortness of breath Dermatological: negative for rash Respiratory: negative for cough or wheezing Urologic: negative for hematuria Abdominal: negative for nausea, vomiting, diarrhea, bright red blood per  rectum, melena, or hematemesis Neurologic: negative for visual changes, syncope, or dizziness All other systems reviewed and are otherwise negative except as noted above.   Physical Exam:  Blood pressure 108/80, pulse (!) 102, height 5' 6.5" (1.689 m), weight 251 lb 6.4 oz (114 kg).  General appearance: alert, cooperative, no distress and moderate distress Neck: no carotid bruit and no JVD Lungs: clear to auscultation bilaterally Heart: irregularly irregular rhythm and mildly tachy rate Abdomen: soft, non-tender; bowel sounds normal; no masses,  no organomegaly Extremities: extremities normal, atraumatic, no cyanosis or edema Pulses: 2+ and symmetric Skin: Skin color, texture, turgor normal. No rashes or lesions Neurologic: Grossly normal  EKG Atrial fibrillation, 102 bpm -- personally reviewed   ASSESSMENT AND PLAN:   1. Atrial Fibrillation: s/p TEE cardioversion 06/27/17, however pt now back in afib. She reports full med compliance. No missed doses of eliquis since cardioversion. Rate is 102 and pt is asymptomatic. She  is on Cardizem 300 mg. Her BP is soft, thus I don't think she would be able to tolerate additional increase, nor addition of BB, especially given potential need to increase diuretics (labs pending). Will refer to the Afib clinic for further management.   2. Diastolic HF: weight is up only 2 lb since discharge but pt feels that she is retaining fluid in abdomen. No LEE. Difficult to assess abdominal edema given body habitus. Will check BNP and BMP today. Will adjust Lasix if needed. We discussed importance of daily weights and low sodium diet.    Follow-Up  Will place referral to afib clinic for recurrent atrial fibrillation / failed DCCV for consideration for AAD therapy.   Lora Glomski Ladoris Gene, MHS [facility] 07/14/2017 4:27 PM

## 2017-07-15 ENCOUNTER — Telehealth: Payer: Self-pay | Admitting: *Deleted

## 2017-07-15 LAB — BASIC METABOLIC PANEL
BUN / CREAT RATIO: 16 (ref 12–28)
BUN: 15 mg/dL (ref 8–27)
CHLORIDE: 102 mmol/L (ref 96–106)
CO2: 23 mmol/L (ref 20–29)
CREATININE: 0.91 mg/dL (ref 0.57–1.00)
Calcium: 9.1 mg/dL (ref 8.7–10.3)
GFR calc Af Amer: 78 mL/min/{1.73_m2} (ref >59)
GFR calc non Af Amer: 68 mL/min/{1.73_m2} (ref >59)
GLUCOSE: 83 mg/dL (ref 65–99)
POTASSIUM: 4.2 mmol/L (ref 3.5–5.2)
SODIUM: 141 mmol/L (ref 134–144)

## 2017-07-15 LAB — PRO B NATRIURETIC PEPTIDE: NT-PRO BNP: 961 pg/mL — AB (ref 0–287)

## 2017-07-15 MED ORDER — POTASSIUM CHLORIDE CRYS ER 20 MEQ PO TBCR: EXTENDED_RELEASE_TABLET | ORAL | 3 refills | Status: DC

## 2017-07-15 MED ORDER — FUROSEMIDE 40 MG PO TABS: ORAL_TABLET | ORAL | 3 refills | Status: DC

## 2017-07-15 NOTE — Telephone Encounter (Signed)
-----   Message from Christie Atkinson, [place] sent at 07/15/2017 12:02 PM EST ----- Kidney function is stable. Reassure pt. She was concerned about this yesterday. Her BNP (fluid marker) is abnormal. She felt like she was starting to retain more fluid in her abdomen and this supports this. Instruct pt to increase lasix to 40 mg BID x 3 days and to also increase Kdur to 20 meQ Bid x 3 days. After 3 days, go back to original dose of 40 mg Lasix once daily and 20 meq Kdur once daily. Monitor weight at home daily. Weight should improve. If it does not of if any weight gain, call our office next week. Watch salt intake. Keep plans to f/u in afib clinic.

## 2017-07-19 ENCOUNTER — Encounter (HOSPITAL_COMMUNITY): Payer: Self-pay | Admitting: Nurse Practitioner

## 2017-07-19 ENCOUNTER — Ambulatory Visit (HOSPITAL_COMMUNITY)
Admission: RE | Admit: 2017-07-19 | Discharge: 2017-07-19 | Disposition: A | Discharge status: Home / Self Care | Payer: Medicare Other | Source: Ambulatory Visit | Attending: Nurse Practitioner | Admitting: Nurse Practitioner

## 2017-07-19 VITALS — BP 106/72 | HR 99 | Ht 66.5 in | Wt 249.0 lb

## 2017-07-19 DIAGNOSIS — Z882 Allergy status to sulfonamides status: Secondary | ICD-10-CM | POA: Diagnosis not present

## 2017-07-19 DIAGNOSIS — Z79899 Other long term (current) drug therapy: Secondary | ICD-10-CM | POA: Diagnosis not present

## 2017-07-19 DIAGNOSIS — M797 Fibromyalgia: Secondary | ICD-10-CM | POA: Insufficient documentation

## 2017-07-19 DIAGNOSIS — I481 Persistent atrial fibrillation: Secondary | ICD-10-CM | POA: Insufficient documentation

## 2017-07-19 DIAGNOSIS — G4730 Sleep apnea, unspecified: Secondary | ICD-10-CM | POA: Diagnosis not present

## 2017-07-19 DIAGNOSIS — I509 Heart failure, unspecified: Secondary | ICD-10-CM | POA: Insufficient documentation

## 2017-07-19 DIAGNOSIS — I4819 Other persistent atrial fibrillation: Secondary | ICD-10-CM

## 2017-07-19 NOTE — Patient Instructions (Signed)
Scheduling will be in touch to schedule stress test.

## 2017-07-19 NOTE — Progress Notes (Signed)
Primary Care Physician: Patient, No Pcp Per Referring Physician: [facility] f/u Cardiologist: Dr. Linard Millers  Christie Atkinson is a 62 y.o. female with a h/o prior AF with gall bladder surgery in 2016,  CHF, obesity, untreated sleep apnea, that was hospitalized 1/19 to 1/23  for afib with RVR,   She was admitted to cardiology with acute on chronic congestive heart failure, type unknown, and atrial fibrillation with RVR, likely secondary to heart failure exacerbation.   No ischemic workup in past. She was not on a home diuretic regimen. She was diuresed with 40 mg IV lasix BID.She was transitioned to [employer] today.  Weight is down 17 lbs from admission - discharge weight is 249 lbs.Orthostatics vitals negative for hypotension. She was euvolemic on exam. She discharged on 40 mg lasix daily and was instructed to weigh daily and keep a log.   She had TEE guided cardioversion 1/28 which was initially successful but then had ERAF on  f/u appointment with general cardiology 2/07.  She is in the afib clinc today to discuss options to restore SR.  Today, she denies symptoms of palpitations, chest pain, shortness of breath, orthopnea, PND, lower extremity edema, dizziness, presyncope, syncope, or neurologic sequela. The patient is tolerating medications without difficulties and is otherwise without complaint today.   Past Medical History:  Diagnosis Date  . CHF (congestive heart failure) ([place])   . Fibromyalgia   . Gestational diabetes   . Heart murmur   . Sleep apnea    Past Surgical History:  Procedure Laterality Date  . ABDOMINAL HYSTERECTOMY    . APPENDECTOMY    . CARDIOVERSION N/A 07/04/2017   Procedure: CARDIOVERSION;  Surgeon: [name], MD;  Location: [facility] ENDOSCOPY;  Service: Cardiovascular;  Laterality: N/A;  . CHOLECYSTECTOMY N/A 02/24/2015   Procedure: LAPAROSCOPIC CHOLECYSTECTOMY;  Surgeon: [name], MD;  Location: [place];  Service: General;  Laterality: N/A;  . TEE WITHOUT  CARDIOVERSION N/A 07/04/2017   Procedure: TRANSESOPHAGEAL ECHOCARDIOGRAM (TEE);  Surgeon: [name], MD;  Location: [facility] ENDOSCOPY;  Service: Cardiovascular;  Laterality: N/A;    Current Outpatient Medications  Medication Sig Dispense Refill  . apixaban (ELIQUIS) 5 MG TABS tablet Take 1 tablet (5 mg total) by mouth 2 (two) times daily. 180 tablet 3  . Black Elderberry,Berry-Flower, 575 MG CAPS Take 1 capsule by mouth as needed.    . diltiazem (CARDIZEM CD) 300 MG 24 hr capsule Take 1 capsule (300 mg total) by mouth daily. 30 capsule 3  . furosemide (LASIX) 40 MG tablet Take 1 tablet by mouth twice a day for 3 days then go down to 1 tablet daily 90 tablet 3  . Multiple Vitamin (MULTIVITAMIN WITH MINERALS) TABS tablet Take 1 tablet by mouth daily as needed (when not eating properly).    . NON FORMULARY doterra deep blue    . NON FORMULARY cardiotrophin pmg    . OVER THE COUNTER MEDICATION Take 250 mg by mouth daily as needed (cold symptoms). "Relora" herbal supplement    . potassium chloride SA (K-DUR,KLOR-CON) 20 MEQ tablet Take 1 tablet by mouth twice a day for 3 days then go down to 1 tablet daily 90 tablet 3  . Hyaluronic Acid-Vitamin C (HYALURONIC ACID PO) Take 1 tablet by mouth 2 (two) times a week.    [name] OVER THE COUNTER MEDICATION Take 3 drops by mouth daily as needed (pain). CBD oil     No current facility-administered medications for this encounter.  Allergies  Allergen Reactions  . Other Other (See Comments)    Metals Polyester - including hospital gowns and sheets - allergic contact dermatitis  . Sulfa Antibiotics Hives  . Sulfur Hives    Social History   Socioeconomic History  . Marital status: Married    Spouse name: Not on file  . Number of children: Not on file  . Years of education: Not on file  . Highest education level: Not on file  Social Needs  . Financial resource strain: Not on file  . Food insecurity - worry: Not on file  . Food insecurity -  inability: Not on file  . Transportation needs - medical: Not on file  . Transportation needs - non-medical: Not on file  Occupational History  . Not on file  Tobacco Use  . Smoking status: Never Smoker  . Smokeless tobacco: Never Used  Substance and Sexual Activity  . Alcohol use: Yes    Comment: little  . Drug use: No  . Sexual activity: Not on file  Other Topics Concern  . Not on file  Social History Narrative  . Not on file    Family History  Problem Relation Age of Onset  . Heart disease Mother   . Hypertension Mother   . Hyperlipidemia Mother   . Heart disease Father   . Heart attack Father   . Hypertension Father   . Hyperlipidemia Father     ROS- All systems are reviewed and negative except as per the HPI above  Physical Exam: Vitals:   07/19/17 1536  BP: 106/72  Pulse: 99  Weight: 249 lb (112.9 kg)  Height: 5' 6.5" (1.689 m)   Wt Readings from Last 3 Encounters:  07/19/17 249 lb (112.9 kg)  07/14/17 251 lb 6.4 oz (114 kg)  06/29/17 249 lb 3.2 oz (113 kg)    Labs: Lab Results  Component Value Date   NA 141 07/14/2017   K 4.2 07/14/2017   CL 102 07/14/2017   CO2 23 07/14/2017   GLUCOSE 83 07/14/2017   BUN 15 07/14/2017   CREATININE 0.91 07/14/2017   CALCIUM 9.1 07/14/2017   MG 2.1 07/04/2017   No results found for: INR Lab Results  Component Value Date   CHOL 107 06/26/2017   HDL 20 (L) 06/26/2017   LDLCALC 72 06/26/2017   TRIG 77 06/26/2017     GEN- The patient is well appearing, alert and oriented x 3 today.   Head- normocephalic, atraumatic Eyes-  Sclera clear, conjunctiva pink Ears- hearing intact Oropharynx- clear Neck- supple, no JVP Lymph- no cervical lymphadenopathy Lungs- Clear to ausculation bilaterally, normal work of breathing Heart- irregular rate and rhythm, no murmurs, rubs or gallops, PMI not laterally displaced GI- soft, NT, ND, + BS Extremities- no clubbing, cyanosis, or edema MS- no significant deformity or  atrophy Skin- no rash or lesion Psych- euthymic mood, full affect Neuro- strength and sensation are intact  EKG- afib at 99 bpm, qrs int 82 ms, qtc 472 ms Echo-Study Conclusions  - Left ventricle: The cavity size was normal. Wall thickness was   increased in a pattern of mild LVH. Systolic function was normal.   The estimated ejection fraction was in the range of 60% to 65%.   Wall motion was normal; there were no regional wall motion   abnormalities. The study is not technically sufficient to allow   evaluation of LV diastolic function. - Mitral valve: Calcified annulus. Mildly thickened leaflets .   There  was mild regurgitation. Valve area by continuity equation   (using LVOT flow): 2.07 cm^2. - Left atrium: The atrium was mildly dilated. - Atrial septum: No defect or patent foramen ovale was identified. TEE-Study Conclusions  - Left ventricle: Systolic function was normal. The estimated   ejection fraction was in the range of 50% to 55%. No evidence of   thrombus. - Ventricular septum: The contour showed a normal configuration.   There was no evidence of a ventricular septal defect. - Mitral valve: There was mild regurgitation. - Left atrium: No formed thrombus. Dense spontaneous contrast No   evidence of thrombus in the atrial cavity or appendage. - Right ventricle: The cavity size was mildly dilated. - Atrial septum: There was a patent foramen ovale. - Impressions: Advanced 3D rendering of the atrial septum was   performed. [place] x 1 150 Joules   Converted from afib rate 110 to NSR rate 80&'s   No immediate neurologic sequela  Impressions:  - Advanced 3D rendering of the atrial septum was performed. [place] x 1   150 Joules   Converted from afib rate 110 to NSR rate 80&'s   No immediate neurologic sequela Successful cardioversion. No   cardiac source of emboli was indentified.   Assessment and Plan: 1. Persistent afib Successful cardioversion but with  ERAF Discussed antiarrythmic's, flecainide may be a good choice if no CAD Will scheduled for lexi myoview She is too young for amiodarone and does not want to come back into hospital at this point for tikosyn or sotalol   Continue diltiazem 300 mg for rate control Continue eliquis without missed doses for a chadsvasc score of 3  2. Lifestyle issues Weight loss/ exercise recommended She has untreated sleep apnea 2/2 not being able to tolerate cpap I will recommend that she see Dr. Ron Parker for an oral appliance for snoring as sleep apnea untreated will diminish ability to restore SR  After seeing results of myoview, I will instruct pt to the next step   Butch Penny C. Carroll, [facility] [address] [place], [place] [address] [phone]

## 2017-07-19 NOTE — H&P (View-Only) (Signed)
Primary Care Physician: Patient, No Pcp Per Referring Physician: [facility] f/u Cardiologist: Dr. Linard Millers  JEANNEMARIE SAWAYA is a 62 y.o. female with a h/o prior AF with gall bladder surgery in 2016,  CHF, obesity, untreated sleep apnea, that was hospitalized 1/19 to 1/23  for afib with RVR,   She was admitted to cardiology with acute on chronic congestive heart failure, type unknown, and atrial fibrillation with RVR, likely secondary to heart failure exacerbation.   No ischemic workup in past. She was not on a home diuretic regimen. She was diuresed with 40 mg IV lasix BID.She was transitioned to [employer] today.  Weight is down 17 lbs from admission - discharge weight is 249 lbs.Orthostatics vitals negative for hypotension. She was euvolemic on exam. She discharged on 40 mg lasix daily and was instructed to weigh daily and keep a log.   She had TEE guided cardioversion 1/28 which was initially successful but then had ERAF on  f/u appointment with general cardiology 2/07.  She is in the afib clinc today to discuss options to restore SR.  Today, she denies symptoms of palpitations, chest pain, shortness of breath, orthopnea, PND, lower extremity edema, dizziness, presyncope, syncope, or neurologic sequela. The patient is tolerating medications without difficulties and is otherwise without complaint today.   Past Medical History:  Diagnosis Date  . CHF (congestive heart failure) ([place])   . Fibromyalgia   . Gestational diabetes   . Heart murmur   . Sleep apnea    Past Surgical History:  Procedure Laterality Date  . ABDOMINAL HYSTERECTOMY    . APPENDECTOMY    . CARDIOVERSION N/A 07/04/2017   Procedure: CARDIOVERSION;  Surgeon: [name], MD;  Location: [facility] ENDOSCOPY;  Service: Cardiovascular;  Laterality: N/A;  . CHOLECYSTECTOMY N/A 02/24/2015   Procedure: LAPAROSCOPIC CHOLECYSTECTOMY;  Surgeon: [name], MD;  Location: [place];  Service: General;  Laterality: N/A;  . TEE WITHOUT  CARDIOVERSION N/A 07/04/2017   Procedure: TRANSESOPHAGEAL ECHOCARDIOGRAM (TEE);  Surgeon: [name], MD;  Location: [facility] ENDOSCOPY;  Service: Cardiovascular;  Laterality: N/A;    Current Outpatient Medications  Medication Sig Dispense Refill  . apixaban (ELIQUIS) 5 MG TABS tablet Take 1 tablet (5 mg total) by mouth 2 (two) times daily. 180 tablet 3  . Black Elderberry,Berry-Flower, 575 MG CAPS Take 1 capsule by mouth as needed.    . diltiazem (CARDIZEM CD) 300 MG 24 hr capsule Take 1 capsule (300 mg total) by mouth daily. 30 capsule 3  . furosemide (LASIX) 40 MG tablet Take 1 tablet by mouth twice a day for 3 days then go down to 1 tablet daily 90 tablet 3  . Multiple Vitamin (MULTIVITAMIN WITH MINERALS) TABS tablet Take 1 tablet by mouth daily as needed (when not eating properly).    . NON FORMULARY doterra deep blue    . NON FORMULARY cardiotrophin pmg    . OVER THE COUNTER MEDICATION Take 250 mg by mouth daily as needed (cold symptoms). "Relora" herbal supplement    . potassium chloride SA (K-DUR,KLOR-CON) 20 MEQ tablet Take 1 tablet by mouth twice a day for 3 days then go down to 1 tablet daily 90 tablet 3  . Hyaluronic Acid-Vitamin C (HYALURONIC ACID PO) Take 1 tablet by mouth 2 (two) times a week.    [name] OVER THE COUNTER MEDICATION Take 3 drops by mouth daily as needed (pain). CBD oil     No current facility-administered medications for this encounter.  Allergies  Allergen Reactions  . Other Other (See Comments)    Metals Polyester - including hospital gowns and sheets - allergic contact dermatitis  . Sulfa Antibiotics Hives  . Sulfur Hives    Social History   Socioeconomic History  . Marital status: Married    Spouse name: Not on file  . Number of children: Not on file  . Years of education: Not on file  . Highest education level: Not on file  Social Needs  . Financial resource strain: Not on file  . Food insecurity - worry: Not on file  . Food insecurity -  inability: Not on file  . Transportation needs - medical: Not on file  . Transportation needs - non-medical: Not on file  Occupational History  . Not on file  Tobacco Use  . Smoking status: Never Smoker  . Smokeless tobacco: Never Used  Substance and Sexual Activity  . Alcohol use: Yes    Comment: little  . Drug use: No  . Sexual activity: Not on file  Other Topics Concern  . Not on file  Social History Narrative  . Not on file    Family History  Problem Relation Age of Onset  . Heart disease Mother   . Hypertension Mother   . Hyperlipidemia Mother   . Heart disease Father   . Heart attack Father   . Hypertension Father   . Hyperlipidemia Father     ROS- All systems are reviewed and negative except as per the HPI above  Physical Exam: Vitals:   07/19/17 1536  BP: 106/72  Pulse: 99  Weight: 249 lb (112.9 kg)  Height: 5' 6.5" (1.689 m)   Wt Readings from Last 3 Encounters:  07/19/17 249 lb (112.9 kg)  07/14/17 251 lb 6.4 oz (114 kg)  06/29/17 249 lb 3.2 oz (113 kg)    Labs: Lab Results  Component Value Date   NA 141 07/14/2017   K 4.2 07/14/2017   CL 102 07/14/2017   CO2 23 07/14/2017   GLUCOSE 83 07/14/2017   BUN 15 07/14/2017   CREATININE 0.91 07/14/2017   CALCIUM 9.1 07/14/2017   MG 2.1 07/04/2017   No results found for: INR Lab Results  Component Value Date   CHOL 107 06/26/2017   HDL 20 (L) 06/26/2017   LDLCALC 72 06/26/2017   TRIG 77 06/26/2017     GEN- The patient is well appearing, alert and oriented x 3 today.   Head- normocephalic, atraumatic Eyes-  Sclera clear, conjunctiva pink Ears- hearing intact Oropharynx- clear Neck- supple, no JVP Lymph- no cervical lymphadenopathy Lungs- Clear to ausculation bilaterally, normal work of breathing Heart- irregular rate and rhythm, no murmurs, rubs or gallops, PMI not laterally displaced GI- soft, NT, ND, + BS Extremities- no clubbing, cyanosis, or edema MS- no significant deformity or  atrophy Skin- no rash or lesion Psych- euthymic mood, full affect Neuro- strength and sensation are intact  EKG- afib at 99 bpm, qrs int 82 ms, qtc 472 ms Echo-Study Conclusions  - Left ventricle: The cavity size was normal. Wall thickness was   increased in a pattern of mild LVH. Systolic function was normal.   The estimated ejection fraction was in the range of 60% to 65%.   Wall motion was normal; there were no regional wall motion   abnormalities. The study is not technically sufficient to allow   evaluation of LV diastolic function. - Mitral valve: Calcified annulus. Mildly thickened leaflets .   There  was mild regurgitation. Valve area by continuity equation   (using LVOT flow): 2.07 cm^2. - Left atrium: The atrium was mildly dilated. - Atrial septum: No defect or patent foramen ovale was identified. TEE-Study Conclusions  - Left ventricle: Systolic function was normal. The estimated   ejection fraction was in the range of 50% to 55%. No evidence of   thrombus. - Ventricular septum: The contour showed a normal configuration.   There was no evidence of a ventricular septal defect. - Mitral valve: There was mild regurgitation. - Left atrium: No formed thrombus. Dense spontaneous contrast No   evidence of thrombus in the atrial cavity or appendage. - Right ventricle: The cavity size was mildly dilated. - Atrial septum: There was a patent foramen ovale. - Impressions: Advanced 3D rendering of the atrial septum was   performed. [place] x 1 150 Joules   Converted from afib rate 110 to NSR rate 80&'s   No immediate neurologic sequela  Impressions:  - Advanced 3D rendering of the atrial septum was performed. [place] x 1   150 Joules   Converted from afib rate 110 to NSR rate 80&'s   No immediate neurologic sequela Successful cardioversion. No   cardiac source of emboli was indentified.   Assessment and Plan: 1. Persistent afib Successful cardioversion but with  ERAF Discussed antiarrythmic's, flecainide may be a good choice if no CAD Will scheduled for lexi myoview She is too young for amiodarone and does not want to come back into hospital at this point for tikosyn or sotalol   Continue diltiazem 300 mg for rate control Continue eliquis without missed doses for a chadsvasc score of 3  2. Lifestyle issues Weight loss/ exercise recommended She has untreated sleep apnea 2/2 not being able to tolerate cpap I will recommend that she see Dr. Ron Parker for an oral appliance for snoring as sleep apnea untreated will diminish ability to restore SR  After seeing results of myoview, I will instruct pt to the next step   Butch Penny C. Zophia Marrone, [facility] [address] [place], [place] [address] [phone]

## 2017-07-20 ENCOUNTER — Telehealth (HOSPITAL_COMMUNITY): Payer: Self-pay | Admitting: *Deleted

## 2017-07-20 NOTE — Telephone Encounter (Signed)
Pt cld reporting fluid gain of 3lbs per her scale at home and a feeling of swelling and fluid retention.  Per Roderic Palau, NP, pt should increase furosemide to 60 mg daily and continue k-dur at 20 mEq daily.  Pt understood and will start increase today.

## 2017-07-20 NOTE — Telephone Encounter (Signed)
Patient given detailed instructions per Myocardial Perfusion Study Information Sheet for the test on 07/21/17. Patient notified to arrive 15 minutes early and that it is imperative to arrive on time for appointment to keep from having the test rescheduled.  If you need to cancel or reschedule your appointment, please call the office within 24 hours of your appointment. . Patient verbalized understanding. Christie Atkinson Jacqueline    

## 2017-07-21 ENCOUNTER — Ambulatory Visit (HOSPITAL_COMMUNITY): Payer: Medicare Other | Attending: Cardiovascular Disease

## 2017-07-21 DIAGNOSIS — R5383 Other fatigue: Secondary | ICD-10-CM | POA: Insufficient documentation

## 2017-07-21 DIAGNOSIS — R079 Chest pain, unspecified: Secondary | ICD-10-CM | POA: Insufficient documentation

## 2017-07-21 DIAGNOSIS — I10 Essential (primary) hypertension: Secondary | ICD-10-CM | POA: Insufficient documentation

## 2017-07-21 DIAGNOSIS — I481 Persistent atrial fibrillation: Secondary | ICD-10-CM | POA: Diagnosis not present

## 2017-07-21 DIAGNOSIS — R9439 Abnormal result of other cardiovascular function study: Secondary | ICD-10-CM | POA: Insufficient documentation

## 2017-07-21 DIAGNOSIS — R002 Palpitations: Secondary | ICD-10-CM | POA: Insufficient documentation

## 2017-07-21 DIAGNOSIS — Z8249 Family history of ischemic heart disease and other diseases of the circulatory system: Secondary | ICD-10-CM | POA: Insufficient documentation

## 2017-07-21 DIAGNOSIS — R0609 Other forms of dyspnea: Secondary | ICD-10-CM | POA: Diagnosis not present

## 2017-07-21 DIAGNOSIS — I4819 Other persistent atrial fibrillation: Secondary | ICD-10-CM

## 2017-07-21 MED ORDER — TECHNETIUM TC 99M SESTAMIBI GENERIC - CARDIOLITE
32.8000 | Freq: Once | INTRAVENOUS | Status: AC | PRN
  Administered 2017-07-21: 32.8 via INTRAVENOUS
  Filled 2017-07-21: qty 33

## 2017-07-21 MED ORDER — REGADENOSON 0.4 MG/5ML IV SOLN
0.4000 mg | Freq: Once | INTRAVENOUS | Status: AC
  Administered 2017-07-21: 0.4 mg via INTRAVENOUS

## 2017-07-22 ENCOUNTER — Ambulatory Visit (HOSPITAL_COMMUNITY): Payer: Medicare Other | Attending: Cardiology

## 2017-07-22 LAB — MYOCARDIAL PERFUSION IMAGING
CHL CUP NUCLEAR SDS: 5
CHL CUP NUCLEAR SRS: 0
CHL CUP NUCLEAR SSS: 5
CHL CUP RESTING HR STRESS: 81 {beats}/min
LVDIAVOL: 138 mL (ref 46–106)
LVSYSVOL: 78 mL
Peak HR: 103 {beats}/min
RATE: 0.21
TID: 0.98

## 2017-07-22 MED ORDER — TECHNETIUM TC 99M TETROFOSMIN IV KIT
30.9000 | PACK | Freq: Once | INTRAVENOUS | Status: AC | PRN
  Administered 2017-07-22: 30.9 via INTRAVENOUS
  Filled 2017-07-22: qty 31

## 2017-07-26 ENCOUNTER — Encounter (HOSPITAL_COMMUNITY): Payer: Self-pay | Admitting: *Deleted

## 2017-07-26 ENCOUNTER — Telehealth (HOSPITAL_COMMUNITY): Payer: Self-pay | Admitting: *Deleted

## 2017-07-26 DIAGNOSIS — I4819 Other persistent atrial fibrillation: Secondary | ICD-10-CM

## 2017-07-26 NOTE — Telephone Encounter (Signed)
Patient is scheduled for R&L Heart Cath with Dr. Tamala Julian on 3/5. To arrive at 530am NPO after MN Sip of water with cardizem. Hold Eliquis 2 days prior to testing. Labs to be drawn on 2/26. Pt will have daughter with her for transportation home. Follow up will be arrange once results of cath are known. Pt verbalized understanding of all instructions.

## 2017-08-02 ENCOUNTER — Ambulatory Visit (HOSPITAL_COMMUNITY)
Admission: RE | Admit: 2017-08-02 | Discharge: 2017-08-02 | Disposition: A | Discharge status: Home / Self Care | Payer: Medicare Other | Source: Ambulatory Visit | Attending: Nurse Practitioner | Admitting: Nurse Practitioner

## 2017-08-02 DIAGNOSIS — R9439 Abnormal result of other cardiovascular function study: Secondary | ICD-10-CM | POA: Diagnosis not present

## 2017-08-02 LAB — BASIC METABOLIC PANEL
Anion gap: 10 (ref 5–15)
BUN: 24 mg/dL — AB (ref 6–20)
CHLORIDE: 102 mmol/L (ref 101–111)
CO2: 25 mmol/L (ref 22–32)
CREATININE: 1.07 mg/dL — AB (ref 0.44–1.00)
Calcium: 9.3 mg/dL (ref 8.9–10.3)
GFR calc Af Amer: >60 mL/min (ref >60)
GFR calc non Af Amer: 54 mL/min — ABNORMAL LOW (ref >60)
Glucose, Bld: 100 mg/dL — ABNORMAL HIGH (ref 65–99)
Potassium: 4 mmol/L (ref 3.5–5.1)
SODIUM: 137 mmol/L (ref 135–145)

## 2017-08-02 LAB — CBC
HEMATOCRIT: 43.8 % (ref 36.0–46.0)
HEMOGLOBIN: 14.9 g/dL (ref 12.0–15.0)
MCH: 30.8 pg (ref 26.0–34.0)
MCHC: 34 g/dL (ref 30.0–36.0)
MCV: 90.7 fL (ref 78.0–100.0)
Platelets: 203 10*3/uL (ref 150–400)
RBC: 4.83 MIL/uL (ref 3.87–5.11)
RDW: 12.7 % (ref 11.5–15.5)
WBC: 8.3 10*3/uL (ref 4.0–10.5)

## 2017-08-08 ENCOUNTER — Telehealth: Payer: Self-pay | Admitting: *Deleted

## 2017-08-08 DIAGNOSIS — R9439 Abnormal result of other cardiovascular function study: Secondary | ICD-10-CM

## 2017-08-08 HISTORY — DX: Abnormal result of other cardiovascular function study: R94.39

## 2017-08-08 NOTE — Telephone Encounter (Signed)
Pt contacted pre-catheterization scheduled at [facility] for: Tuesday August 09, 2017 7:30AM Verified arrival time and place: [place] Entrance A/[place] at: 5:30 AM Nothing to eat or drink after midnight night prior to cath.  Hold: No apixaban 08/07/17, 08/08/17, until after cath. No furosemide AM of cath  Except hold medications AM meds can be  taken pre-cath with sip of water including: ASA 81 mg  Confirmed patient has responsible person to drive home post procedure and observe patient for 24 hours: yes

## 2017-08-09 ENCOUNTER — Ambulatory Visit (HOSPITAL_COMMUNITY)
Admission: RE | Admit: 2017-08-09 | Discharge: 2017-08-09 | Disposition: A | Discharge status: Home / Self Care | Payer: Medicare Other | Source: Ambulatory Visit | Attending: Interventional Cardiology | Admitting: Interventional Cardiology

## 2017-08-09 ENCOUNTER — Encounter (HOSPITAL_COMMUNITY): Payer: Self-pay | Admitting: Interventional Cardiology

## 2017-08-09 ENCOUNTER — Encounter (HOSPITAL_COMMUNITY)
Admission: RE | Disposition: A | Discharge status: Home / Self Care | Payer: Self-pay | Source: Ambulatory Visit | Attending: Interventional Cardiology

## 2017-08-09 DIAGNOSIS — E669 Obesity, unspecified: Secondary | ICD-10-CM | POA: Diagnosis not present

## 2017-08-09 DIAGNOSIS — Z882 Allergy status to sulfonamides status: Secondary | ICD-10-CM | POA: Diagnosis not present

## 2017-08-09 DIAGNOSIS — I4819 Other persistent atrial fibrillation: Secondary | ICD-10-CM

## 2017-08-09 DIAGNOSIS — Z6838 Body mass index (BMI) 38.0-38.9, adult: Secondary | ICD-10-CM | POA: Insufficient documentation

## 2017-08-09 DIAGNOSIS — I4891 Unspecified atrial fibrillation: Secondary | ICD-10-CM | POA: Diagnosis present

## 2017-08-09 DIAGNOSIS — M797 Fibromyalgia: Secondary | ICD-10-CM | POA: Insufficient documentation

## 2017-08-09 DIAGNOSIS — G4730 Sleep apnea, unspecified: Secondary | ICD-10-CM | POA: Insufficient documentation

## 2017-08-09 DIAGNOSIS — Z7901 Long term (current) use of anticoagulants: Secondary | ICD-10-CM | POA: Insufficient documentation

## 2017-08-09 DIAGNOSIS — R9439 Abnormal result of other cardiovascular function study: Secondary | ICD-10-CM | POA: Diagnosis not present

## 2017-08-09 DIAGNOSIS — I509 Heart failure, unspecified: Secondary | ICD-10-CM | POA: Diagnosis not present

## 2017-08-09 DIAGNOSIS — I481 Persistent atrial fibrillation: Secondary | ICD-10-CM | POA: Diagnosis not present

## 2017-08-09 DIAGNOSIS — I272 Pulmonary hypertension, unspecified: Secondary | ICD-10-CM | POA: Diagnosis not present

## 2017-08-09 DIAGNOSIS — Z01810 Encounter for preprocedural cardiovascular examination: Secondary | ICD-10-CM

## 2017-08-09 HISTORY — PX: RIGHT/LEFT HEART CATH AND CORONARY ANGIOGRAPHY: CATH118266

## 2017-08-09 LAB — PROTIME-INR
INR: 1.07
PROTHROMBIN TIME: 13.8 s (ref 11.4–15.2)

## 2017-08-09 LAB — POCT I-STAT 3, ART BLOOD GAS (G3+)
Acid-base deficit: 1 mmol/L (ref 0.0–2.0)
Bicarbonate: 23.9 mmol/L (ref 20.0–28.0)
O2 Saturation: 93 %
PCO2 ART: 41.5 mmHg (ref 32.0–48.0)
PH ART: 7.369 (ref 7.350–7.450)
PO2 ART: 70 mmHg — AB (ref 83.0–108.0)
TCO2: 25 mmol/L (ref 22–32)

## 2017-08-09 LAB — POCT I-STAT 3, VENOUS BLOOD GAS (G3P V)
Acid-base deficit: 1 mmol/L (ref 0.0–2.0)
BICARBONATE: 24.8 mmol/L (ref 20.0–28.0)
O2 Saturation: 74 %
PH VEN: 7.357 (ref 7.250–7.430)
TCO2: 26 mmol/L (ref 22–32)
pCO2, Ven: 44.2 mmHg (ref 44.0–60.0)
pO2, Ven: 41 mmHg (ref 32.0–45.0)

## 2017-08-09 SURGERY — RIGHT/LEFT HEART CATH AND CORONARY ANGIOGRAPHY
Anesthesia: LOCAL

## 2017-08-09 MED ORDER — LIDOCAINE HCL (PF) 1 % IJ SOLN
INTRAMUSCULAR | Status: DC | PRN
  Administered 2017-08-09 (×2): 2 mL via INTRADERMAL

## 2017-08-09 MED ORDER — SODIUM CHLORIDE 0.9% FLUSH: 3.0000 mL | Freq: Two times a day (BID) | INTRAVENOUS | Status: DC

## 2017-08-09 MED ORDER — SODIUM CHLORIDE 0.9 % IV SOLN: 250.0000 mL | INTRAVENOUS | Status: DC | PRN

## 2017-08-09 MED ORDER — SODIUM CHLORIDE 0.9 % WEIGHT BASED INFUSION: 1.0000 mL/kg/h | INTRAVENOUS | Status: DC

## 2017-08-09 MED ORDER — SODIUM CHLORIDE 0.9% FLUSH: 3.0000 mL | INTRAVENOUS | Status: DC | PRN

## 2017-08-09 MED ORDER — MIDAZOLAM HCL 2 MG/2ML IJ SOLN
INTRAMUSCULAR | Status: AC
  Filled 2017-08-09: qty 2

## 2017-08-09 MED ORDER — SODIUM CHLORIDE 0.9 % WEIGHT BASED INFUSION
3.0000 mL/kg/h | INTRAVENOUS | Status: DC
  Administered 2017-08-09: 3 mL/kg/h via INTRAVENOUS

## 2017-08-09 MED ORDER — HEPARIN SODIUM (PORCINE) 1000 UNIT/ML IJ SOLN
INTRAMUSCULAR | Status: AC
  Filled 2017-08-09: qty 1

## 2017-08-09 MED ORDER — ASPIRIN 81 MG PO CHEW: 81.0000 mg | CHEWABLE_TABLET | ORAL | Status: DC

## 2017-08-09 MED ORDER — ACETAMINOPHEN 325 MG PO TABS: 650.0000 mg | ORAL_TABLET | ORAL | Status: DC | PRN

## 2017-08-09 MED ORDER — VERAPAMIL HCL 2.5 MG/ML IV SOLN
INTRAVENOUS | Status: DC | PRN
  Administered 2017-08-09: 10 mL via INTRA_ARTERIAL

## 2017-08-09 MED ORDER — FENTANYL CITRATE (PF) 100 MCG/2ML IJ SOLN
INTRAMUSCULAR | Status: AC
  Filled 2017-08-09: qty 2

## 2017-08-09 MED ORDER — HEPARIN SODIUM (PORCINE) 1000 UNIT/ML IJ SOLN
INTRAMUSCULAR | Status: DC | PRN
  Administered 2017-08-09: 5000 [IU] via INTRAVENOUS

## 2017-08-09 MED ORDER — FENTANYL CITRATE (PF) 100 MCG/2ML IJ SOLN
INTRAMUSCULAR | Status: DC | PRN
  Administered 2017-08-09: 50 ug via INTRAVENOUS

## 2017-08-09 MED ORDER — MIDAZOLAM HCL 2 MG/2ML IJ SOLN
INTRAMUSCULAR | Status: DC | PRN
  Administered 2017-08-09: 1 mg via INTRAVENOUS

## 2017-08-09 MED ORDER — HEPARIN (PORCINE) IN NACL 2-0.9 UNIT/ML-% IJ SOLN
INTRAMUSCULAR | Status: AC | PRN
  Administered 2017-08-09: 1000 mL

## 2017-08-09 MED ORDER — SODIUM CHLORIDE 0.9 % IV SOLN: INTRAVENOUS | Status: DC

## 2017-08-09 MED ORDER — ONDANSETRON HCL 4 MG/2ML IJ SOLN: 4.0000 mg | Freq: Four times a day (QID) | INTRAMUSCULAR | Status: DC | PRN

## 2017-08-09 MED ORDER — IOPAMIDOL (ISOVUE-370) INJECTION 76%
INTRAVENOUS | Status: AC
  Filled 2017-08-09: qty 100

## 2017-08-09 MED ORDER — VERAPAMIL HCL 2.5 MG/ML IV SOLN
INTRAVENOUS | Status: AC
  Filled 2017-08-09: qty 2

## 2017-08-09 MED ORDER — LIDOCAINE HCL (PF) 1 % IJ SOLN
INTRAMUSCULAR | Status: AC
  Filled 2017-08-09: qty 30

## 2017-08-09 SURGICAL SUPPLY — 13 items
CATH BALLN WEDGE 5F 110CM (CATHETERS) ×2 IMPLANT
CATH IMPULSE 5F ANG/FL3.5 (CATHETERS) ×2 IMPLANT
COVER PRB 48X5XTLSCP FOLD TPE (BAG) ×1 IMPLANT
COVER PROBE 5X48 (BAG) ×1
DEVICE RAD COMP TR BAND LRG (VASCULAR PRODUCTS) ×2 IMPLANT
GLIDESHEATH SLEND A-KIT 6F 22G (SHEATH) ×2 IMPLANT
GUIDEWIRE INQWIRE 1.5J.035X260 (WIRE) ×1 IMPLANT
INQWIRE 1.5J .035X260CM (WIRE) ×2
KIT HEART LEFT (KITS) ×2 IMPLANT
PACK CARDIAC CATHETERIZATION (CUSTOM PROCEDURE TRAY) ×2 IMPLANT
SHEATH GLIDE SLENDER 4/5FR (SHEATH) ×2 IMPLANT
TRANSDUCER W/STOPCOCK (MISCELLANEOUS) ×2 IMPLANT
TUBING CIL FLEX 10 FLL-RA (TUBING) ×2 IMPLANT

## 2017-08-09 NOTE — Progress Notes (Signed)
R brachial sheath pulled, pressure held 10 minutes, VSS, level 0. Gauze/tegaderm dsg applied, Clean,dry,intact.

## 2017-08-09 NOTE — Interval H&P Note (Signed)
Cath Lab Visit (complete for each Cath Lab visit)  Clinical Evaluation Leading to the Procedure:   ACS: No.  Non-ACS:    Anginal Classification: CCS II  Anti-ischemic medical therapy: Minimal Therapy (1 class of medications)  Non-Invasive Test Results: Intermediate-risk stress test findings: cardiac mortality 1-3%/year  Prior CABG: No previous CABG      History and Physical Interval Note:  08/09/2017 7:09 AM  Christie Atkinson  has presented today for surgery, with the diagnosis of abnormal stress test  Indication is an intermediate risk nuclear stress.The various methods of treatment have been discussed with the patient and family. After consideration of risks, benefits and other options for treatment, the patient has consented to  Procedure(s): RIGHT/LEFT HEART CATH AND CORONARY ANGIOGRAPHY (N/A) as a surgical intervention .  The patient's history has been reviewed, patient examined, no change in status, stable for surgery.  I have reviewed the patient's chart and labs.  Questions were answered to the patient's satisfaction.     [name] III

## 2017-08-09 NOTE — Discharge Instructions (Signed)

## 2017-08-10 NOTE — Progress Notes (Signed)
Dr Blenda Mounts patient

## 2017-08-11 ENCOUNTER — Encounter (HOSPITAL_COMMUNITY): Payer: Self-pay | Admitting: *Deleted

## 2017-08-24 ENCOUNTER — Encounter (HOSPITAL_COMMUNITY): Payer: Self-pay | Admitting: Nurse Practitioner

## 2017-08-24 ENCOUNTER — Ambulatory Visit (HOSPITAL_COMMUNITY)
Admission: RE | Admit: 2017-08-24 | Discharge: 2017-08-24 | Disposition: A | Discharge status: Home / Self Care | Payer: Medicare Other | Source: Ambulatory Visit | Attending: Nurse Practitioner | Admitting: Nurse Practitioner

## 2017-08-24 VITALS — BP 112/66 | HR 91 | Ht 67.0 in | Wt 248.6 lb

## 2017-08-24 DIAGNOSIS — M25561 Pain in right knee: Secondary | ICD-10-CM | POA: Diagnosis not present

## 2017-08-24 DIAGNOSIS — M21161 Varus deformity, not elsewhere classified, right knee: Secondary | ICD-10-CM | POA: Diagnosis not present

## 2017-08-24 DIAGNOSIS — M25562 Pain in left knee: Secondary | ICD-10-CM | POA: Diagnosis not present

## 2017-08-24 DIAGNOSIS — Z9889 Other specified postprocedural states: Secondary | ICD-10-CM | POA: Insufficient documentation

## 2017-08-24 DIAGNOSIS — Z9049 Acquired absence of other specified parts of digestive tract: Secondary | ICD-10-CM | POA: Diagnosis not present

## 2017-08-24 DIAGNOSIS — Z8249 Family history of ischemic heart disease and other diseases of the circulatory system: Secondary | ICD-10-CM | POA: Insufficient documentation

## 2017-08-24 DIAGNOSIS — I509 Heart failure, unspecified: Secondary | ICD-10-CM | POA: Insufficient documentation

## 2017-08-24 DIAGNOSIS — G4730 Sleep apnea, unspecified: Secondary | ICD-10-CM | POA: Diagnosis not present

## 2017-08-24 DIAGNOSIS — Z79899 Other long term (current) drug therapy: Secondary | ICD-10-CM | POA: Diagnosis not present

## 2017-08-24 DIAGNOSIS — Z90710 Acquired absence of both cervix and uterus: Secondary | ICD-10-CM | POA: Diagnosis not present

## 2017-08-24 DIAGNOSIS — I481 Persistent atrial fibrillation: Secondary | ICD-10-CM | POA: Diagnosis not present

## 2017-08-24 DIAGNOSIS — Z7901 Long term (current) use of anticoagulants: Secondary | ICD-10-CM | POA: Insufficient documentation

## 2017-08-24 DIAGNOSIS — Z882 Allergy status to sulfonamides status: Secondary | ICD-10-CM | POA: Diagnosis not present

## 2017-08-24 DIAGNOSIS — R269 Unspecified abnormalities of gait and mobility: Secondary | ICD-10-CM | POA: Diagnosis not present

## 2017-08-24 DIAGNOSIS — M21162 Varus deformity, not elsewhere classified, left knee: Secondary | ICD-10-CM | POA: Diagnosis not present

## 2017-08-24 DIAGNOSIS — M170 Bilateral primary osteoarthritis of knee: Secondary | ICD-10-CM | POA: Diagnosis not present

## 2017-08-24 DIAGNOSIS — I4819 Other persistent atrial fibrillation: Secondary | ICD-10-CM

## 2017-08-24 MED ORDER — FLECAINIDE ACETATE 50 MG PO TABS: 50.0000 mg | ORAL_TABLET | Freq: Two times a day (BID) | ORAL | 3 refills | Status: DC

## 2017-08-24 NOTE — Patient Instructions (Signed)
Start Flecainide 50mg  twice a day on Sunday

## 2017-08-24 NOTE — Progress Notes (Signed)
Primary Care Physician: Patient, No Pcp Per Referring Physician: [facility] f/u Cardiologist: Dr. Linard Millers  Christie Atkinson is a 62 y.o. female with a h/o prior AF with gall bladder surgery in 2016,  CHF, obesity, untreated sleep apnea, that was hospitalized 1/19 to 1/23  for afib with RVR,   She was admitted to cardiology with acute on chronic congestive heart failure, type unknown, and atrial fibrillation with RVR, likely secondary to heart failure exacerbation.   No ischemic workup in past. She was not on a home diuretic regimen. She was diuresed with 40 mg IV lasix BID.She was transitioned to [employer] today.  Weight is down 17 lbs from admission - discharge weight is 249 lbs.Orthostatics vitals negative for hypotension. She was euvolemic on exam. She discharged on 40 mg lasix daily and was instructed to weigh daily and keep a log.   She had TEE guided cardioversion 1/28 which was initially successful but then had ERAF on  f/u appointment with general cardiology 2/07. However, it is unclear how long she has had persistent afib after she developed in 2016 and was lost to f/u until January of this year. However, pt reports that she felt well until Christmas when she started retaining fluid and noted the shortness of breath.  She is in the afib clinic, 07/19/17 to discuss options to restore SR. We discussed flecainide and scheduled a Myoview which showed risk for CAD. She then went on to have a cath without any significant CAD.  Today, she denies symptoms of palpitations, chest pain, shortness of breath, orthopnea, PND, lower extremity edema, dizziness, presyncope, syncope, or neurologic sequela. The patient is tolerating medications without difficulties and is otherwise without complaint today.   Past Medical History:  Diagnosis Date  . CHF (congestive heart failure) ([place])   . Fibromyalgia   . Gestational diabetes   . Heart murmur   . Sleep apnea    Past Surgical History:  Procedure  Laterality Date  . ABDOMINAL HYSTERECTOMY    . APPENDECTOMY    . CARDIOVERSION N/A 07/04/2017   Procedure: CARDIOVERSION;  Surgeon: [name], MD;  Location: [facility] ENDOSCOPY;  Service: Cardiovascular;  Laterality: N/A;  . CHOLECYSTECTOMY N/A 02/24/2015   Procedure: LAPAROSCOPIC CHOLECYSTECTOMY;  Surgeon: [name], MD;  Location: [place];  Service: General;  Laterality: N/A;  . RIGHT/LEFT HEART CATH AND CORONARY ANGIOGRAPHY N/A 08/09/2017   Procedure: RIGHT/LEFT HEART CATH AND CORONARY ANGIOGRAPHY;  Surgeon: [name], MD;  Location: [place] CV LAB;  Service: Cardiovascular;  Laterality: N/A;  . TEE WITHOUT CARDIOVERSION N/A 07/04/2017   Procedure: TRANSESOPHAGEAL ECHOCARDIOGRAM (TEE);  Surgeon: [name], MD;  Location: [facility] ENDOSCOPY;  Service: Cardiovascular;  Laterality: N/A;    Current Outpatient Medications  Medication Sig Dispense Refill  . apixaban (ELIQUIS) 5 MG TABS tablet Take 1 tablet (5 mg total) by mouth 2 (two) times daily. 180 tablet 3  . Black Elderberry,Berry-Flower, 575 MG CAPS Take 1 capsule by mouth daily as needed (Cold).     [name] diltiazem (CARDIZEM CD) 300 MG 24 hr capsule Take 1 capsule (300 mg total) by mouth daily. 30 capsule 3  . furosemide (LASIX) 40 MG tablet Take 60 mg by mouth.    . Hyaluronic Acid-Vitamin C (HYALURONIC ACID PO) Take 1 tablet by mouth 2 (two) times a week.    . Multiple Vitamin (MULTIVITAMIN WITH MINERALS) TABS tablet Take 1 tablet by mouth daily as needed (when not eating properly).    [name] NON  FORMULARY 1 application. doterra deep blue     . NON FORMULARY Take 1 tablet by mouth daily. cardiotrophin pmg     . OVER THE COUNTER MEDICATION Take 250 mg by mouth daily as needed (cold symptoms). "Relora" herbal supplement    . OVER THE COUNTER MEDICATION Take 3 drops by mouth daily as needed (pain). CBD oil    . potassium chloride SA (K-DUR,KLOR-CON) 20 MEQ tablet Take 20 mEq by mouth once.    . flecainide (TAMBOCOR) 50 MG tablet Take 1  tablet (50 mg total) by mouth 2 (two) times daily. 60 tablet 3   No current facility-administered medications for this encounter.     Allergies  Allergen Reactions  . Other Other (See Comments)    Metals Polyester - including hospital gowns and sheets - allergic contact dermatitis  . Sulfa Antibiotics Hives  . Sulfur Hives    Social History   Socioeconomic History  . Marital status: Married    Spouse name: Not on file  . Number of children: Not on file  . Years of education: Not on file  . Highest education level: Not on file  Social Needs  . Financial resource strain: Not on file  . Food insecurity - worry: Not on file  . Food insecurity - inability: Not on file  . Transportation needs - medical: Not on file  . Transportation needs - non-medical: Not on file  Occupational History  . Not on file  Tobacco Use  . Smoking status: Never Smoker  . Smokeless tobacco: Never Used  Substance and Sexual Activity  . Alcohol use: Yes    Comment: little  . Drug use: No  . Sexual activity: Not on file  Other Topics Concern  . Not on file  Social History Narrative  . Not on file    Family History  Problem Relation Age of Onset  . Heart disease Mother   . Hypertension Mother   . Hyperlipidemia Mother   . Heart disease Father   . Heart attack Father   . Hypertension Father   . Hyperlipidemia Father     ROS- All systems are reviewed and negative except as per the HPI above  Physical Exam: Vitals:   08/24/17 1351  BP: 112/66  Pulse: 91  Weight: 248 lb 9.6 oz (112.8 kg)  Height: 5\' 7"  (1.702 m)   Wt Readings from Last 3 Encounters:  08/24/17 248 lb 9.6 oz (112.8 kg)  08/09/17 245 lb (111.1 kg)  07/21/17 249 lb (112.9 kg)    Labs: Lab Results  Component Value Date   NA 137 08/02/2017   K 4.0 08/02/2017   CL 102 08/02/2017   CO2 25 08/02/2017   GLUCOSE 100 (H) 08/02/2017   BUN 24 (H) 08/02/2017   CREATININE 1.07 (H) 08/02/2017   CALCIUM 9.3 08/02/2017   MG  2.1 07/04/2017   Lab Results  Component Value Date   INR 1.07 08/09/2017   Lab Results  Component Value Date   CHOL 107 06/26/2017   HDL 20 (L) 06/26/2017   LDLCALC 72 06/26/2017   TRIG 77 06/26/2017     GEN- The patient is well appearing, alert and oriented x 3 today.   Head- normocephalic, atraumatic Eyes-  Sclera clear, conjunctiva pink Ears- hearing intact Oropharynx- clear Neck- supple, no JVP Lymph- no cervical lymphadenopathy Lungs- Clear to ausculation bilaterally, normal work of breathing Heart- irregular rate and rhythm, no murmurs, rubs or gallops, PMI not laterally displaced GI- soft, NT,  ND, + BS Extremities- no clubbing, cyanosis, or edema MS- no significant deformity or atrophy Skin- no rash or lesion Psych- euthymic mood, full affect Neuro- strength and sensation are intact  EKG- afib at 91 bpm, qrs int 78 ms, qtc 460 ms Echo-Study Conclusions  - Left ventricle: The cavity size was normal. Wall thickness was   increased in a pattern of mild LVH. Systolic function was normal.   The estimated ejection fraction was in the range of 60% to 65%.   Wall motion was normal; there were no regional wall motion   abnormalities. The study is not technically sufficient to allow   evaluation of LV diastolic function. - Mitral valve: Calcified annulus. Mildly thickened leaflets .   There was mild regurgitation. Valve area by continuity equation   (using LVOT flow): 2.07 cm^2. - Left atrium: The atrium was mildly dilated. - Atrial septum: No defect or patent foramen ovale was identified. TEE-Study Conclusions  - Left ventricle: Systolic function was normal. The estimated   ejection fraction was in the range of 50% to 55%. No evidence of   thrombus. - Ventricular septum: The contour showed a normal configuration.   There was no evidence of a ventricular septal defect. - Mitral valve: There was mild regurgitation. - Left atrium: No formed thrombus. Dense  spontaneous contrast No   evidence of thrombus in the atrial cavity or appendage. - Right ventricle: The cavity size was mildly dilated. - Atrial septum: There was a patent foramen ovale. - Impressions: Advanced 3D rendering of the atrial septum was   performed. [place] x 1 150 Joules   Converted from afib rate 110 to NSR rate 80&'s   No immediate neurologic sequela  Stress test- Summary Defect 1:  There is a medium defect of moderate severity. The defect is reversible.  Overall Study Impression Myocardial perfusion is abnormal. Findings consistent with ischemia. This is an intermediate risk study. Overall left ventricular systolic function was normal. LV cavity size is normal. Nuclear stress EF: 43%.       Cath report-Conclusion    Normal left main.  Normal left anterior descending coronary artery.  Normal circumflex coronary artery.  Normal dominant RCA.  Normal LV size with normal hemodynamics and estimated ejection fraction of 50%.  False positive myocardial perfusion imaging.  Mild pulmonary hypertension with mean pressure of 27 mmHg.  RECOMMENDATIONS:   Management of atrial fibrillation with aggressive rate control and stroke prophylaxis     Impressions:  - Advanced 3D rendering of the atrial septum was performed. [place] x 1   150 Joules   Converted from afib rate 110 to NSR rate 80&'s   No immediate neurologic sequela Successful cardioversion. No   cardiac source of emboli was indentified.   Assessment and Plan: 1. Persistent afib Successful cardioversion but with ERAF Discussed antiarrythmic's, flecainide may be a good choice, no CAD Start flecainde 50 mg bid on 3/24 Continue diltiazem 300 mg for rate control Continue eliquis without missed doses for a chadsvasc score of 3   2. Lifestyle issues Weight loss/ exercise recommended She has untreated sleep apnea 2/2 not being able to tolerate cpap I have recommend that she see Dr. Ron Parker for an oral  appliance for snoring as sleep apnea untreated will diminish ability to restore SR  F/u for EKG on Wednesday for ekg on flecainide, probably will increase to 100 mg bid, and plan for later cardioversion   Butch Penny C. Nazire Fruth, [facility] [address]  [address] [place], [place] [address] [phone]

## 2017-08-26 ENCOUNTER — Other Ambulatory Visit (HOSPITAL_COMMUNITY): Payer: Self-pay | Admitting: *Deleted

## 2017-08-26 MED ORDER — FUROSEMIDE 40 MG PO TABS: 60.0000 mg | ORAL_TABLET | Freq: Every day | ORAL | 2 refills | Status: DC

## 2017-08-26 MED ORDER — DILTIAZEM HCL ER COATED BEADS 300 MG PO CP24: 300.0000 mg | ORAL_CAPSULE | Freq: Every day | ORAL | 2 refills | Status: DC

## 2017-08-26 MED ORDER — POTASSIUM CHLORIDE CRYS ER 20 MEQ PO TBCR: 20.0000 meq | EXTENDED_RELEASE_TABLET | Freq: Every day | ORAL | 2 refills | Status: DC

## 2017-08-26 MED ORDER — APIXABAN 5 MG PO TABS: 5.0000 mg | ORAL_TABLET | Freq: Two times a day (BID) | ORAL | 2 refills | Status: DC

## 2017-08-30 ENCOUNTER — Telehealth: Payer: Self-pay | Admitting: *Deleted

## 2017-08-30 ENCOUNTER — Other Ambulatory Visit (HOSPITAL_COMMUNITY): Payer: Self-pay | Admitting: *Deleted

## 2017-08-30 DIAGNOSIS — I4891 Unspecified atrial fibrillation: Secondary | ICD-10-CM

## 2017-08-30 MED ORDER — FUROSEMIDE 40 MG PO TABS: 60.0000 mg | ORAL_TABLET | Freq: Every day | ORAL | 1 refills | Status: DC

## 2017-08-30 MED ORDER — APIXABAN 5 MG PO TABS: 5.0000 mg | ORAL_TABLET | Freq: Two times a day (BID) | ORAL | 1 refills | Status: DC

## 2017-08-30 MED ORDER — DILTIAZEM HCL ER COATED BEADS 300 MG PO CP24: 300.0000 mg | ORAL_CAPSULE | Freq: Every day | ORAL | 1 refills | Status: DC

## 2017-08-30 NOTE — Telephone Encounter (Signed)
-----   Message from Juluis Mire, RN sent at 08/26/2017 10:32 AM EDT ----- Regarding: sleep study Pt needs sleep study for afib. Thanks stacy

## 2017-08-31 ENCOUNTER — Encounter (HOSPITAL_COMMUNITY): Payer: Self-pay | Admitting: Nurse Practitioner

## 2017-08-31 ENCOUNTER — Ambulatory Visit (HOSPITAL_COMMUNITY)
Admission: RE | Admit: 2017-08-31 | Discharge: 2017-08-31 | Disposition: A | Discharge status: Home / Self Care | Payer: Medicare Other | Source: Ambulatory Visit | Attending: Nurse Practitioner | Admitting: Nurse Practitioner

## 2017-08-31 VITALS — BP 112/64 | HR 86 | Ht 67.0 in | Wt 254.4 lb

## 2017-08-31 DIAGNOSIS — Z6839 Body mass index (BMI) 39.0-39.9, adult: Secondary | ICD-10-CM | POA: Diagnosis not present

## 2017-08-31 DIAGNOSIS — Z8249 Family history of ischemic heart disease and other diseases of the circulatory system: Secondary | ICD-10-CM | POA: Diagnosis not present

## 2017-08-31 DIAGNOSIS — G4733 Obstructive sleep apnea (adult) (pediatric): Secondary | ICD-10-CM | POA: Diagnosis not present

## 2017-08-31 DIAGNOSIS — I5033 Acute on chronic diastolic (congestive) heart failure: Secondary | ICD-10-CM

## 2017-08-31 DIAGNOSIS — Z90710 Acquired absence of both cervix and uterus: Secondary | ICD-10-CM | POA: Diagnosis not present

## 2017-08-31 DIAGNOSIS — Z882 Allergy status to sulfonamides status: Secondary | ICD-10-CM | POA: Insufficient documentation

## 2017-08-31 DIAGNOSIS — M25562 Pain in left knee: Secondary | ICD-10-CM | POA: Diagnosis not present

## 2017-08-31 DIAGNOSIS — Z79899 Other long term (current) drug therapy: Secondary | ICD-10-CM | POA: Diagnosis not present

## 2017-08-31 DIAGNOSIS — Z9049 Acquired absence of other specified parts of digestive tract: Secondary | ICD-10-CM | POA: Insufficient documentation

## 2017-08-31 DIAGNOSIS — M170 Bilateral primary osteoarthritis of knee: Secondary | ICD-10-CM | POA: Diagnosis not present

## 2017-08-31 DIAGNOSIS — I481 Persistent atrial fibrillation: Secondary | ICD-10-CM | POA: Diagnosis not present

## 2017-08-31 DIAGNOSIS — I4819 Other persistent atrial fibrillation: Secondary | ICD-10-CM

## 2017-08-31 DIAGNOSIS — M25561 Pain in right knee: Secondary | ICD-10-CM | POA: Diagnosis not present

## 2017-08-31 HISTORY — DX: Other ill-defined heart diseases: I51.89

## 2017-08-31 HISTORY — DX: Other persistent atrial fibrillation: I48.19

## 2017-08-31 LAB — MAGNESIUM: Magnesium: 2.1 mg/dL (ref 1.7–2.4)

## 2017-08-31 LAB — BASIC METABOLIC PANEL
ANION GAP: 13 (ref 5–15)
BUN: 11 mg/dL (ref 6–20)
CHLORIDE: 101 mmol/L (ref 101–111)
CO2: 23 mmol/L (ref 22–32)
Calcium: 9.4 mg/dL (ref 8.9–10.3)
Creatinine, Ser: 0.84 mg/dL (ref 0.44–1.00)
GFR calc Af Amer: >60 mL/min (ref >60)
GLUCOSE: 97 mg/dL (ref 65–99)
Potassium: 4 mmol/L (ref 3.5–5.1)
Sodium: 137 mmol/L (ref 135–145)

## 2017-08-31 LAB — CBC
HEMATOCRIT: 42.2 % (ref 36.0–46.0)
HEMOGLOBIN: 14 g/dL (ref 12.0–15.0)
MCH: 30.2 pg (ref 26.0–34.0)
MCHC: 33.2 g/dL (ref 30.0–36.0)
MCV: 91.1 fL (ref 78.0–100.0)
Platelets: 207 10*3/uL (ref 150–400)
RBC: 4.63 MIL/uL (ref 3.87–5.11)
RDW: 13.2 % (ref 11.5–15.5)
WBC: 7.4 10*3/uL (ref 4.0–10.5)

## 2017-08-31 MED ORDER — FUROSEMIDE 40 MG PO TABS: 40.0000 mg | ORAL_TABLET | Freq: Two times a day (BID) | ORAL | 1 refills | Status: DC

## 2017-08-31 MED ORDER — FLECAINIDE ACETATE 50 MG PO TABS: 100.0000 mg | ORAL_TABLET | Freq: Two times a day (BID) | ORAL | 3 refills | Status: DC

## 2017-08-31 NOTE — Patient Instructions (Addendum)
Increase flecainide to 100mg  twice a day (2 of your 50mg  tablets twice a day) Increase lasix to 40mg  twice a day  Cardioversion scheduled for Tuesday, April 2nd  - come to afib clinic at [place] at the [employer] and go to admitting at 10:30am  -Do not eat or drink anything after midnight the night prior to your procedure.  - Take all your medication with a sip of water prior to arrival.  - You will not be able to drive home after your procedure.

## 2017-08-31 NOTE — Progress Notes (Signed)
Primary Care Physician: Patient, No Pcp Per Primary Cardiologist: Dr Tamala Julian Primary Electrophysiologist: Joycelin Radloff     [name] is a 62 y.o. female with a history of persistent atrial fibrillation who presents for follow-up in the [facility].  She recently saw Roderic Palau NP (her note reviewed) and was placed on flecainide 50mg  BID.  She presents today for follow-up.  Remains in afib.  + abdominal distension with 6 lb weigh gain in the past week.  She reports compliance with medical therapy.  Today, she denies symptoms of palpitations, chest pain, shortness of breath, orthopnea, PND, lower extremity edema, dizziness, presyncope, syncope, snoring, daytime somnolence, bleeding, or neurologic sequela. The patient is tolerating medications without difficulties and is otherwise without complaint today.    Atrial Fibrillation Risk Factors:  she does have symptoms or diagnosis of sleep apnea. she is not compliant with CPAP therapy.  she does not have a history of rheumatic fever.  she does not have a history of alcohol use.  she has a BMI of Body mass index is 39.84 kg/m.[name] Filed Weights   08/31/17 0857  Weight: 254 lb 6.4 oz (115.4 kg)    LA size: mildly enlarged   Atrial Fibrillation Management history:  Previous antiarrhythmic drugs: flecainide  Previous cardioversions: 1/19  Previous ablations: none  CHADS2VASC score: 2  Anticoagulation history: eliquis   Past Medical History:  Diagnosis Date  . Diastolic dysfunction   . Fibromyalgia   . Gestational diabetes   . Persistent atrial fibrillation ([place])   . Sleep apnea    Past Surgical History:  Procedure Laterality Date  . ABDOMINAL HYSTERECTOMY    . APPENDECTOMY    . CARDIOVERSION N/A 07/04/2017   Procedure: CARDIOVERSION;  Surgeon: [name], MD;  Location: [facility] ENDOSCOPY;  Service: Cardiovascular;  Laterality: N/A;  . CHOLECYSTECTOMY N/A 02/24/2015   Procedure:  LAPAROSCOPIC CHOLECYSTECTOMY;  Surgeon: [name], MD;  Location: [place];  Service: General;  Laterality: N/A;  . RIGHT/LEFT HEART CATH AND CORONARY ANGIOGRAPHY N/A 08/09/2017   Procedure: RIGHT/LEFT HEART CATH AND CORONARY ANGIOGRAPHY;  Surgeon: [name], MD;  Location: [facility];  Service: Cardiovascular;  Laterality: N/A;  . TEE WITHOUT CARDIOVERSION N/A 07/04/2017   Procedure: TRANSESOPHAGEAL ECHOCARDIOGRAM (TEE);  Surgeon: [name], MD;  Location: [facility] ENDOSCOPY;  Service: Cardiovascular;  Laterality: N/A;    Current Outpatient Medications  Medication Sig Dispense Refill  . apixaban (ELIQUIS) 5 MG TABS tablet Take 1 tablet (5 mg total) by mouth 2 (two) times daily. 60 tablet 1  . Black Elderberry,Berry-Flower, 575 MG CAPS Take 1 capsule by mouth daily as needed (Cold).     [name] diltiazem (CARDIZEM CD) 300 MG 24 hr capsule Take 1 capsule (300 mg total) by mouth daily. 30 capsule 1  . flecainide (TAMBOCOR) 50 MG tablet Take 2 tablets (100 mg total) by mouth 2 (two) times daily. 60 tablet 3  . furosemide (LASIX) 40 MG tablet Take 1 tablet (40 mg total) by mouth 2 (two) times daily. 45 tablet 1  . Hyaluronic Acid-Vitamin C (HYALURONIC ACID PO) Take 1 tablet by mouth 2 (two) times a week.    . Multiple Vitamin (MULTIVITAMIN WITH MINERALS) TABS tablet Take 1 tablet by mouth daily as needed (when not eating properly).    . NON FORMULARY 1 application. doterra deep blue     . NON FORMULARY Take 1 tablet by mouth daily. cardiotrophin pmg     . NON  FORMULARY Shelton Silvas supplement    . NON FORMULARY doterra serenity    . OVER THE COUNTER MEDICATION Take 250 mg by mouth daily as needed (cold symptoms). "Relora" herbal supplement    . OVER THE COUNTER MEDICATION Take 3 drops by mouth daily as needed (pain). CBD oil    . potassium chloride SA (K-DUR,KLOR-CON) 20 MEQ tablet Take 1 tablet (20 mEq total) by mouth daily. 90 tablet 2   No current facility-administered medications for this  encounter.     Allergies  Allergen Reactions  . Other Other (See Comments)    Metals Polyester - including hospital gowns and sheets - allergic contact dermatitis  . Sulfa Antibiotics Hives  . Sulfur Hives    Social History   Socioeconomic History  . Marital status: Married    Spouse name: Not on file  . Number of children: Not on file  . Years of education: Not on file  . Highest education level: Not on file  Occupational History  . Not on file  Social Needs  . Financial resource strain: Not on file  . Food insecurity:    Worry: Not on file    Inability: Not on file  . Transportation needs:    Medical: Not on file    Non-medical: Not on file  Tobacco Use  . Smoking status: Never Smoker  . Smokeless tobacco: Never Used  Substance and Sexual Activity  . Alcohol use: Yes    Comment: little  . Drug use: No  . Sexual activity: Not on file  Lifestyle  . Physical activity:    Days per week: Not on file    Minutes per session: Not on file  . Stress: Not on file  Relationships  . Social connections:    Talks on phone: Not on file    Gets together: Not on file    Attends religious service: Not on file    Active member of club or organization: Not on file    Attends meetings of clubs or organizations: Not on file    Relationship status: Not on file  . Intimate partner violence:    Fear of current or ex partner: Not on file    Emotionally abused: Not on file    Physically abused: Not on file    Forced sexual activity: Not on file  Other Topics Concern  . Not on file  Social History Narrative   Lives in [place]   Not working    Family History  Problem Relation Age of Onset  . Heart disease Mother   . Hypertension Mother   . Hyperlipidemia Mother   . Heart disease Father   . Heart attack Father   . Hypertension Father   . Hyperlipidemia Father    The patient does not have a history of early familial atrial fibrillation or other arrhythmias.  ROS- All  systems are reviewed and negative except as per the HPI above.  Physical Exam: Vitals:   08/31/17 0857  BP: 112/64  Pulse: 86  Weight: 254 lb 6.4 oz (115.4 kg)  Height: 5\' 7"  (1.702 m)    GEN- The patient is well appearing, alert and oriented x 3 today.   Head- normocephalic, atraumatic Eyes-  Sclera clear, conjunctiva pink Ears- hearing intact Oropharynx- clear Neck- supple  Lungs- Clear to ausculation bilaterally, normal work of breathing Heart- Regular rate and rhythm, no murmurs, rubs or gallops  GI- soft, NT, ND, + BS Extremities- no clubbing, cyanosis, or edema MS-  no significant deformity or atrophy Skin- no rash or lesion Psych- euthymic mood, full affect Neuro- strength and sensation are intact  Wt Readings from Last 3 Encounters:  08/31/17 254 lb 6.4 oz (115.4 kg)  08/24/17 248 lb 9.6 oz (112.8 kg)  08/09/17 245 lb (111.1 kg)    EKG today demonstrates afib Echo 1/19 demonstrated preserved EF, mild LA enlargement  Epic records are reviewed at length today  Assessment and Plan:  1. Persistent atrial fibrillation The patient has symptomatic persistent atrial fibrillation.  The patients CHAD2VASC score is 2.  she is  appropriately anticoagulated at this time. The patient is adequately rate controlled with eliquis. Antiarrhythmic therapy to dates has included flecainide 50mg  BID.  The patients left atrial size is mildly enlarged. A long discussion with the patient was had today regarding therapeutic strategies.  Extensive discussion of lifestyle modification including attempt to lose weight was also discussed.  Presently, our recommendations include increase flecainide to 100mg  BID Return in 1 week for ekg.  If still in AF, would pursue cardioversion same day.  Risks of the procedure were discussed with the patient who wishes to proceed.  If she fails medical therapy with flecainide, would proceed with ablation.  We discussed ablation today. Bmet, mg, cbc  today   2. Morbid obesity As above, lifestyle modification was discussed at length including regular exercise and weight reduction.  3. Obstructive sleep apnea The importance of adequate treatment of sleep apnea was discussed today in order to improve our ability to maintain sinus rhythm long term.  She will see Dr Ron Parker for consideration of prosthetic treatment  4. Acute on chronic diastolic dysfunction Volume overloaded today Increase lasix to 40mg  BID Bmet, mg today  Return in 1 week for cardioversion Follow-up in AF clinic in 2 weeks  Very complicated patient with medicine refractory atrial fibrillation.  A high level of decision making was required for this encounter.  [name], MD 08/31/2017 9:42 AM

## 2017-09-06 ENCOUNTER — Encounter (HOSPITAL_COMMUNITY): Payer: Self-pay | Admitting: *Deleted

## 2017-09-06 ENCOUNTER — Ambulatory Visit (HOSPITAL_COMMUNITY): Payer: Medicare Other | Admitting: Anesthesiology

## 2017-09-06 ENCOUNTER — Other Ambulatory Visit: Payer: Self-pay

## 2017-09-06 ENCOUNTER — Ambulatory Visit (HOSPITAL_COMMUNITY)
Admission: RE | Admit: 2017-09-06 | Discharge: 2017-09-06 | Disposition: A | Discharge status: Home / Self Care | Payer: Medicare Other | Source: Ambulatory Visit | Attending: Nurse Practitioner | Admitting: Nurse Practitioner

## 2017-09-06 ENCOUNTER — Encounter (HOSPITAL_COMMUNITY)
Admission: RE | Disposition: A | Discharge status: Home / Self Care | Payer: Self-pay | Source: Ambulatory Visit | Attending: Cardiology

## 2017-09-06 ENCOUNTER — Ambulatory Visit (HOSPITAL_COMMUNITY)
Admission: RE | Admit: 2017-09-06 | Discharge: 2017-09-06 | Disposition: A | Discharge status: Home / Self Care | Payer: Medicare Other | Source: Ambulatory Visit | Attending: Cardiology | Admitting: Cardiology

## 2017-09-06 DIAGNOSIS — M797 Fibromyalgia: Secondary | ICD-10-CM | POA: Diagnosis not present

## 2017-09-06 DIAGNOSIS — Z882 Allergy status to sulfonamides status: Secondary | ICD-10-CM | POA: Insufficient documentation

## 2017-09-06 DIAGNOSIS — E119 Type 2 diabetes mellitus without complications: Secondary | ICD-10-CM | POA: Insufficient documentation

## 2017-09-06 DIAGNOSIS — I509 Heart failure, unspecified: Secondary | ICD-10-CM | POA: Diagnosis not present

## 2017-09-06 DIAGNOSIS — I4891 Unspecified atrial fibrillation: Secondary | ICD-10-CM | POA: Diagnosis not present

## 2017-09-06 DIAGNOSIS — G4730 Sleep apnea, unspecified: Secondary | ICD-10-CM | POA: Insufficient documentation

## 2017-09-06 DIAGNOSIS — Z79899 Other long term (current) drug therapy: Secondary | ICD-10-CM | POA: Diagnosis not present

## 2017-09-06 DIAGNOSIS — Z6839 Body mass index (BMI) 39.0-39.9, adult: Secondary | ICD-10-CM | POA: Insufficient documentation

## 2017-09-06 DIAGNOSIS — M25561 Pain in right knee: Secondary | ICD-10-CM | POA: Diagnosis not present

## 2017-09-06 DIAGNOSIS — M25562 Pain in left knee: Secondary | ICD-10-CM | POA: Diagnosis not present

## 2017-09-06 DIAGNOSIS — I4892 Unspecified atrial flutter: Secondary | ICD-10-CM | POA: Insufficient documentation

## 2017-09-06 DIAGNOSIS — I481 Persistent atrial fibrillation: Secondary | ICD-10-CM

## 2017-09-06 DIAGNOSIS — Z7901 Long term (current) use of anticoagulants: Secondary | ICD-10-CM | POA: Insufficient documentation

## 2017-09-06 DIAGNOSIS — J45909 Unspecified asthma, uncomplicated: Secondary | ICD-10-CM | POA: Diagnosis not present

## 2017-09-06 DIAGNOSIS — I110 Hypertensive heart disease with heart failure: Secondary | ICD-10-CM | POA: Diagnosis not present

## 2017-09-06 DIAGNOSIS — M170 Bilateral primary osteoarthritis of knee: Secondary | ICD-10-CM | POA: Diagnosis not present

## 2017-09-06 DIAGNOSIS — I10 Essential (primary) hypertension: Secondary | ICD-10-CM | POA: Insufficient documentation

## 2017-09-06 HISTORY — PX: CARDIOVERSION: SHX1299

## 2017-09-06 SURGERY — CARDIOVERSION
Anesthesia: General

## 2017-09-06 MED ORDER — ACETAMINOPHEN 160 MG/5ML PO SOLN
325.0000 mg | ORAL | Status: DC | PRN
  Filled 2017-09-06: qty 20.3

## 2017-09-06 MED ORDER — OXYCODONE HCL 5 MG PO TABS: 5.0000 mg | ORAL_TABLET | Freq: Once | ORAL | Status: DC | PRN

## 2017-09-06 MED ORDER — MEPERIDINE HCL 100 MG/ML IJ SOLN: 6.2500 mg | INTRAMUSCULAR | Status: DC | PRN

## 2017-09-06 MED ORDER — ONDANSETRON HCL 4 MG/2ML IJ SOLN: 4.0000 mg | Freq: Once | INTRAMUSCULAR | Status: DC | PRN

## 2017-09-06 MED ORDER — SODIUM CHLORIDE 0.9 % IV SOLN
INTRAVENOUS | Status: AC | PRN
  Administered 2017-09-06: 500 mL via INTRAVENOUS

## 2017-09-06 MED ORDER — FENTANYL CITRATE (PF) 100 MCG/2ML IJ SOLN: 25.0000 ug | INTRAMUSCULAR | Status: DC | PRN

## 2017-09-06 MED ORDER — OXYCODONE HCL 5 MG/5ML PO SOLN: 5.0000 mg | Freq: Once | ORAL | Status: DC | PRN

## 2017-09-06 MED ORDER — ACETAMINOPHEN 325 MG PO TABS
325.0000 mg | ORAL_TABLET | ORAL | Status: DC | PRN
  Filled 2017-09-06: qty 2

## 2017-09-06 MED ORDER — PROPOFOL 10 MG/ML IV BOLUS
INTRAVENOUS | Status: DC | PRN
  Administered 2017-09-06: 90 mg via INTRAVENOUS

## 2017-09-06 MED ORDER — LIDOCAINE 2% (20 MG/ML) 5 ML SYRINGE
INTRAMUSCULAR | Status: DC | PRN
  Administered 2017-09-06: 60 mg via INTRAVENOUS

## 2017-09-06 NOTE — Interval H&P Note (Signed)
History and Physical Interval Note:  09/06/2017 11:58 AM  Christie Atkinson  has presented today for surgery, with the diagnosis of AFIB  The various methods of treatment have been discussed with the patient and family. After consideration of risks, benefits and other options for treatment, the patient has consented to  Procedure(s): CARDIOVERSION (N/A) as a surgical intervention .  The patient's history has been reviewed, patient examined, no change in status, stable for surgery.  I have reviewed the patient's chart and labs.  Questions were answered to the patient's satisfaction.     Fransico Him

## 2017-09-06 NOTE — Addendum Note (Signed)
Addendum  created 09/06/17 1325 by Orlie Dakin, CRNA   Child order released for a procedure order, Intraprocedure Blocks edited, Intraprocedure Event edited, Intraprocedure Flowsheets edited, Sign clinical note

## 2017-09-06 NOTE — Transfer of Care (Signed)
Immediate Anesthesia Transfer of Care Note  Patient: Christie Atkinson  Procedure(s) Performed: CARDIOVERSION (N/A )  Patient Location: Endoscopy Unit  Anesthesia Type:General  Level of Consciousness: sedated and drowsy  Airway & Oxygen Therapy: Patient Spontanous Breathing and Patient connected to face mask oxygen  Post-op Assessment: Report given to RN and Post -op Vital signs reviewed and stable  Post vital signs: Reviewed and stable  Last Vitals:  Vitals Value Taken Time  BP    Temp    Pulse    Resp    SpO2      Last Pain:  Vitals:   09/06/17 1055  TempSrc: Oral  PainSc: 0-No pain         Complications: No apparent anesthesia complications

## 2017-09-06 NOTE — Anesthesia Procedure Notes (Signed)
Procedure Name: General with mask airway Date/Time: 09/06/2017 12:04 PM Performed by: Orlie Dakin, CRNA Pre-anesthesia Checklist: Patient identified, Emergency Drugs available, Suction available, Patient being monitored and Timeout performed Patient Re-evaluated:Patient Re-evaluated prior to induction Oxygen Delivery Method: Ambu bag Preoxygenation: Pre-oxygenation with 100% oxygen Induction Type: IV induction

## 2017-09-06 NOTE — Progress Notes (Addendum)
Pt in for pre dccv EKG.  To be reviewed by Roderic Palau, NP  Pt in for EKG prior to cardioversion as her flecainide was increased to 100 mg bid last week. She continues in atrial flutter at 91 bpm, qrs int 98 ms, qtc 489 ms.To endo unit for cardioversion. No missed doses of anticoagulation.

## 2017-09-06 NOTE — H&P (View-Only) (Signed)
Pt in for pre dccv EKG.  To be reviewed by Roderic Palau, NP  Pt in for EKG prior to cardioversion as her flecainide was increased to 100 mg bid last week. She continues in atrial flutter at 91 bpm, qrs int 98 ms, qtc 489 ms.To endo unit for cardioversion. No missed doses of anticoagulation.

## 2017-09-06 NOTE — Anesthesia Postprocedure Evaluation (Signed)
Anesthesia Post Note  Patient: [name]  Procedure(s) Performed: CARDIOVERSION (N/A )     Patient location during evaluation: PACU Anesthesia Type: General Level of consciousness: awake and alert Pain management: pain level controlled Vital Signs Assessment: post-procedure vital signs reviewed and stable Respiratory status: spontaneous breathing, nonlabored ventilation, respiratory function stable and patient connected to nasal cannula oxygen Cardiovascular status: blood pressure returned to baseline and stable Postop Assessment: no apparent nausea or vomiting Anesthetic complications: no    Last Vitals:  Vitals:   09/06/17 1220 09/06/17 1233  BP: 108/79 (!) 85/60  Pulse: (!) 55 61  Resp: 11 12  Temp: 36.5 C   SpO2: 97% 99%    Last Pain:  Vitals:   09/06/17 1233  TempSrc:   PainSc: 0-No pain                 Rosamund Nyland

## 2017-09-06 NOTE — CV Procedure (Signed)
   Electrical Cardioversion Procedure Note ORA BOLLIG [id number] 1955-07-18  Procedure: Electrical Cardioversion Indications:  Atrial Fibrillation  Time Out: Verified patient identification, verified procedure,medications/allergies/relevent history reviewed, required imaging and test results available.  Performed  Procedure Details  The patient was NPO after midnight. Anesthesia was administered at the beside  by Dr.Oddono with 90mg  of propofol and 60mg  of Lidocaine.  Cardioversion was done with synchronized biphasic defibrillation with AP pads with 150watts.  The patient converted to normal sinus rhythm. The patient tolerated the procedure well   IMPRESSION:  Successful cardioversion of atrial fibrillation    Traci Turner 09/06/2017, 11:58 AM

## 2017-09-06 NOTE — Anesthesia Preprocedure Evaluation (Addendum)
Anesthesia Evaluation  Patient identified by MRN, date of birth, ID band Patient awake    Reviewed: Allergy & Precautions, NPO status , Patient's Chart, lab work & pertinent test results  History of Anesthesia Complications Negative for: history of anesthetic complications  Airway Mallampati: II  TM Distance: >3 FB Neck ROM: Full    Dental  (+) Dental Advisory Given   Pulmonary asthma , sleep apnea ,    breath sounds clear to auscultation       Cardiovascular hypertension, (-) angina+ dysrhythmias (new onset ) Atrial Fibrillation  Rhythm:Irregular Rate:Normal  CATH 3/19             Normal left main.  Normal left anterior descending coronary artery.  Normal circumflex coronary artery.  Normal dominant RCA.  Normal LV size with normal hemodynamics and estimated ejection fraction of 50%.  False positive myocardial perfusion imaging.  Mild pulmonary hypertension with mean pressure of 27 mmHg.     Neuro/Psych negative neurological ROS  negative psych ROS   GI/Hepatic negative GI ROS, Neg liver ROS,   Endo/Other  diabetesMorbid obesity  Renal/GU negative Renal ROS     Musculoskeletal  (+) Fibromyalgia -  Abdominal (+) + obese,   Peds  Hematology negative hematology ROS (+)   Anesthesia Other Findings   Reproductive/Obstetrics                             Anesthesia Physical  Anesthesia Plan  ASA: III  Anesthesia Plan: General   Post-op Pain Management:    Induction: Intravenous  PONV Risk Score and Plan: 3 and Propofol infusion and Treatment may vary due to age or medical condition  Airway Management Planned: Natural Airway, Nasal Cannula and Mask  Additional Equipment:   Intra-op Plan:   Post-operative Plan:   Informed Consent: I have reviewed the patients History and Physical, chart, labs and discussed the procedure including the risks, benefits and alternatives for  the proposed anesthesia with the patient or authorized representative who has indicated his/her understanding and acceptance.   Dental advisory given  Plan Discussed with: CRNA, Anesthesiologist and Surgeon  Anesthesia Plan Comments:       Anesthesia Quick Evaluation

## 2017-09-08 ENCOUNTER — Telehealth: Payer: Self-pay | Admitting: *Deleted

## 2017-09-08 NOTE — Telephone Encounter (Signed)
Note sent to Gae Bon okay to schedule sleep study. Per protocol no PA required.

## 2017-09-09 ENCOUNTER — Other Ambulatory Visit (HOSPITAL_COMMUNITY): Payer: Self-pay | Admitting: *Deleted

## 2017-09-09 MED ORDER — FLECAINIDE ACETATE 100 MG PO TABS: 100.0000 mg | ORAL_TABLET | Freq: Two times a day (BID) | ORAL | 2 refills | Status: DC

## 2017-09-09 MED ORDER — FLECAINIDE ACETATE 100 MG PO TABS: 100.0000 mg | ORAL_TABLET | Freq: Two times a day (BID) | ORAL | 0 refills | Status: DC

## 2017-09-12 ENCOUNTER — Other Ambulatory Visit (HOSPITAL_COMMUNITY): Payer: Self-pay | Admitting: *Deleted

## 2017-09-12 ENCOUNTER — Telehealth (HOSPITAL_COMMUNITY): Payer: Self-pay | Admitting: *Deleted

## 2017-09-12 ENCOUNTER — Other Ambulatory Visit (HOSPITAL_COMMUNITY): Payer: Self-pay | Admitting: Nurse Practitioner

## 2017-09-12 MED ORDER — DILTIAZEM HCL ER COATED BEADS 120 MG PO CP24: 120.0000 mg | ORAL_CAPSULE | Freq: Two times a day (BID) | ORAL | Status: DC

## 2017-09-12 MED ORDER — FLECAINIDE ACETATE 100 MG PO TABS: 100.0000 mg | ORAL_TABLET | Freq: Two times a day (BID) | ORAL | 0 refills | Status: DC

## 2017-09-12 NOTE — Telephone Encounter (Signed)
Patient is scheduled for lab study on Saturday October 01 2017. Patient understands her sleep study will be done at [facility] sleep lab. Patient understands she will receive a sleep packet in a week or so. Patient understands to call if she does not receive the sleep packet in a timely manner. Patient agrees with treatment and thanked me for call.

## 2017-09-12 NOTE — Telephone Encounter (Signed)
Patient has been having BP in the 90s/60s and HR in the 50s. Intermittent dizziness. Discussed with Roderic Palau, NP will decrease cardizem to 120mg  BID until seen Wednesday and will reassess at that time.

## 2017-09-14 ENCOUNTER — Ambulatory Visit (HOSPITAL_COMMUNITY)
Admission: RE | Admit: 2017-09-14 | Discharge: 2017-09-14 | Disposition: A | Discharge status: Home / Self Care | Payer: Medicare Other | Source: Ambulatory Visit | Attending: Nurse Practitioner | Admitting: Nurse Practitioner

## 2017-09-14 ENCOUNTER — Encounter (HOSPITAL_COMMUNITY): Payer: Self-pay | Admitting: Nurse Practitioner

## 2017-09-14 VITALS — BP 116/82 | HR 122 | Ht 67.0 in | Wt 254.0 lb

## 2017-09-14 DIAGNOSIS — M170 Bilateral primary osteoarthritis of knee: Secondary | ICD-10-CM | POA: Diagnosis not present

## 2017-09-14 DIAGNOSIS — Z79899 Other long term (current) drug therapy: Secondary | ICD-10-CM | POA: Diagnosis not present

## 2017-09-14 DIAGNOSIS — I481 Persistent atrial fibrillation: Secondary | ICD-10-CM | POA: Insufficient documentation

## 2017-09-14 DIAGNOSIS — M797 Fibromyalgia: Secondary | ICD-10-CM | POA: Diagnosis not present

## 2017-09-14 DIAGNOSIS — Z7901 Long term (current) use of anticoagulants: Secondary | ICD-10-CM | POA: Insufficient documentation

## 2017-09-14 DIAGNOSIS — Z8632 Personal history of gestational diabetes: Secondary | ICD-10-CM | POA: Insufficient documentation

## 2017-09-14 DIAGNOSIS — Z882 Allergy status to sulfonamides status: Secondary | ICD-10-CM | POA: Insufficient documentation

## 2017-09-14 DIAGNOSIS — Z90710 Acquired absence of both cervix and uterus: Secondary | ICD-10-CM | POA: Diagnosis not present

## 2017-09-14 DIAGNOSIS — Z8249 Family history of ischemic heart disease and other diseases of the circulatory system: Secondary | ICD-10-CM | POA: Insufficient documentation

## 2017-09-14 DIAGNOSIS — I4819 Other persistent atrial fibrillation: Secondary | ICD-10-CM

## 2017-09-14 DIAGNOSIS — G4730 Sleep apnea, unspecified: Secondary | ICD-10-CM | POA: Insufficient documentation

## 2017-09-14 DIAGNOSIS — M25561 Pain in right knee: Secondary | ICD-10-CM | POA: Diagnosis not present

## 2017-09-14 DIAGNOSIS — I4891 Unspecified atrial fibrillation: Secondary | ICD-10-CM | POA: Diagnosis present

## 2017-09-14 DIAGNOSIS — M25562 Pain in left knee: Secondary | ICD-10-CM | POA: Diagnosis not present

## 2017-09-14 DIAGNOSIS — Z9049 Acquired absence of other specified parts of digestive tract: Secondary | ICD-10-CM | POA: Diagnosis not present

## 2017-09-15 NOTE — Progress Notes (Signed)
Primary Care Physician: Patient, No Pcp Per Referring Physician: [facility] f/u Cardiologist: Dr. Linard Millers EP: Dr. [name] is a 62 y.o. female with a h/o prior AF with gall bladder surgery in 2016,  CHF, obesity, untreated sleep apnea, that was hospitalized 1/19 to 1/23  for afib with RVR.  She was admitted to cardiology with acute on chronic congestive heart failure, type unknown, and atrial fibrillation with RVR, likely secondary to heart failure exacerbation.   No ischemic workup in past. She was not on a home diuretic regimen. She was diuresed with 40 mg IV lasix BID.She was transitioned to [employer] today.  Weight is down 17 lbs from admission - discharge weight is 249 lbs.Orthostatics vitals negative for hypotension. She was euvolemic on exam. She discharged on 40 mg lasix daily and was instructed to weigh daily and keep a log.   She had TEE guided cardioversion 1/28 which was initially successful but then had ERAF on  f/u appointment with general cardiology 2/07. However, it is unclear how long she has had persistent afib after she developed in 2016 and was lost to f/u until January of this year. However, pt reports that she felt well until Christmas when she started retaining fluid and noted the shortness of breath.  She is in the afib clinic, 07/19/17 to discuss options to restore SR. We discussed flecainide and scheduled a Myoview which showed risk for CAD. She then went on to have a cath without any significant CAD.  F/u in afib clinic, 4/11, she ultimately was loaded on flecainide 100 mg bid and went on to have successful cardioversion but returns in afib today. Her cardizem was reduced to 120 mg bid from 300 mg daily for soft BP's last week.   Her heart rate is 122 bpm today but reports still with  soft BP's at home.   Today, she denies symptoms of palpitations, chest pain, shortness of breath, orthopnea, PND, lower extremity edema, dizziness, presyncope, syncope, or  neurologic sequela. The patient is tolerating medications without difficulties and is otherwise without complaint today.   Past Medical History:  Diagnosis Date  . Diastolic dysfunction   . Fibromyalgia   . Gestational diabetes   . Persistent atrial fibrillation ([place])   . Sleep apnea    Past Surgical History:  Procedure Laterality Date  . ABDOMINAL HYSTERECTOMY    . APPENDECTOMY    . CARDIOVERSION N/A 07/04/2017   Procedure: CARDIOVERSION;  Surgeon: [name], MD;  Location: [facility] ENDOSCOPY;  Service: Cardiovascular;  Laterality: N/A;  . CARDIOVERSION N/A 09/06/2017   Procedure: CARDIOVERSION;  Surgeon: [name], MD;  Location: [facility] ENDOSCOPY;  Service: Cardiovascular;  Laterality: N/A;  . CHOLECYSTECTOMY N/A 02/24/2015   Procedure: LAPAROSCOPIC CHOLECYSTECTOMY;  Surgeon: [name], MD;  Location: [place];  Service: General;  Laterality: N/A;  . RIGHT/LEFT HEART CATH AND CORONARY ANGIOGRAPHY N/A 08/09/2017   Procedure: RIGHT/LEFT HEART CATH AND CORONARY ANGIOGRAPHY;  Surgeon: [name], MD;  Location: [place] CV LAB;  Service: Cardiovascular;  Laterality: N/A;  . TEE WITHOUT CARDIOVERSION N/A 07/04/2017   Procedure: TRANSESOPHAGEAL ECHOCARDIOGRAM (TEE);  Surgeon: [name], MD;  Location: [facility] ENDOSCOPY;  Service: Cardiovascular;  Laterality: N/A;    Current Outpatient Medications  Medication Sig Dispense Refill  . acetaminophen (TYLENOL) 325 MG tablet Take 325 mg by mouth every 6 (six) hours as needed for moderate pain or headache.    [name] apixaban (ELIQUIS) 5 MG TABS tablet Take 1  tablet (5 mg total) by mouth 2 (two) times daily. 60 tablet 1  . diltiazem (CARDIZEM CD) 120 MG 24 hr capsule Take 1 capsule (120 mg total) by mouth 2 (two) times daily. 60 capsule   . flecainide (TAMBOCOR) 100 MG tablet TAKE 1 TABLET BY MOUTH TWICE DAILY 60 tablet 0  . furosemide (LASIX) 40 MG tablet Take 1 tablet (40 mg total) by mouth 2 (two) times daily. (Patient taking differently: Take 80  mg by mouth daily. ) 45 tablet 1  . Magnesium 250 MG TABS Take 250 mg by mouth daily.    . Multiple Vitamin (MULTIVITAMIN WITH MINERALS) TABS tablet Take 1 tablet by mouth daily.     . potassium chloride SA (K-DUR,KLOR-CON) 20 MEQ tablet Take 1 tablet (20 mEq total) by mouth daily. 90 tablet 2  . Hyaluronic Acid-Vitamin C (HYALURONIC ACID PO) Take 50 mg by mouth 2 (two) times a week.     . NON FORMULARY Take 1 capsule by mouth daily. Doterra Deep Blue Supplement    . NON FORMULARY Take 1 tablet by mouth daily. Cardiotrophin PMG Supplement    . NON FORMULARY Take 1 tablet by mouth at bedtime. Doterra Serenity Supplement    . NON FORMULARY Take 1 tablet by mouth daily. TriEase Supplement    . OVER THE COUNTER MEDICATION Take 250 mg by mouth daily as needed (for cold symptoms). "Relora" herbal supplement     . OVER THE COUNTER MEDICATION Place 1 drop under the tongue daily as needed (for severe pain). CBD oil      No current facility-administered medications for this encounter.     Allergies  Allergen Reactions  . Other Other (See Comments)    Metals Polyester - including hospital gowns and sheets - allergic contact dermatitis  . Sulfa Antibiotics Hives  . Sulfur Hives    Social History   Socioeconomic History  . Marital status: Married    Spouse name: Not on file  . Number of children: Not on file  . Years of education: Not on file  . Highest education level: Not on file  Occupational History  . Not on file  Social Needs  . Financial resource strain: Not on file  . Food insecurity:    Worry: Not on file    Inability: Not on file  . Transportation needs:    Medical: Not on file    Non-medical: Not on file  Tobacco Use  . Smoking status: Never Smoker  . Smokeless tobacco: Never Used  Substance and Sexual Activity  . Alcohol use: Yes    Comment: little  . Drug use: No  . Sexual activity: Not on file  Lifestyle  . Physical activity:    Days per week: Not on file     Minutes per session: Not on file  . Stress: Not on file  Relationships  . Social connections:    Talks on phone: Not on file    Gets together: Not on file    Attends religious service: Not on file    Active member of club or organization: Not on file    Attends meetings of clubs or organizations: Not on file    Relationship status: Not on file  . Intimate partner violence:    Fear of current or ex partner: Not on file    Emotionally abused: Not on file    Physically abused: Not on file    Forced sexual activity: Not on file  Other Topics Concern  .  Not on file  Social History Narrative   Lives in [place]   Not working    Family History  Problem Relation Age of Onset  . Heart disease Mother   . Hypertension Mother   . Hyperlipidemia Mother   . Heart disease Father   . Heart attack Father   . Hypertension Father   . Hyperlipidemia Father     ROS- All systems are reviewed and negative except as per the HPI above  Physical Exam: Vitals:   09/14/17 1129  BP: 116/82  Pulse: (!) 122  Weight: 254 lb (115.2 kg)  Height: 5\' 7"  (1.702 m)   Wt Readings from Last 3 Encounters:  09/14/17 254 lb (115.2 kg)  09/06/17 254 lb (115.2 kg)  08/31/17 254 lb 6.4 oz (115.4 kg)    Labs: Lab Results  Component Value Date   NA 137 08/31/2017   K 4.0 08/31/2017   CL 101 08/31/2017   CO2 23 08/31/2017   GLUCOSE 97 08/31/2017   BUN 11 08/31/2017   CREATININE 0.84 08/31/2017   CALCIUM 9.4 08/31/2017   MG 2.1 08/31/2017   Lab Results  Component Value Date   INR 1.07 08/09/2017   Lab Results  Component Value Date   CHOL 107 06/26/2017   HDL 20 (L) 06/26/2017   LDLCALC 72 06/26/2017   TRIG 77 06/26/2017     GEN- The patient is well appearing, alert and oriented x 3 today.   Head- normocephalic, atraumatic Eyes-  Sclera clear, conjunctiva pink Ears- hearing intact Oropharynx- clear Neck- supple, no JVP Lymph- no cervical lymphadenopathy Lungs- Clear to ausculation  bilaterally, normal work of breathing Heart- irregular rate and rhythm, no murmurs, rubs or gallops, PMI not laterally displaced GI- soft, NT, ND, + BS Extremities- no clubbing, cyanosis, or edema MS- no significant deformity or atrophy Skin- no rash or lesion Psych- euthymic mood, full affect Neuro- strength and sensation are intact  EKG- afib at 91 bpm, qrs int 78 ms, qtc 460 ms Echo-Study Conclusions  - Left ventricle: The cavity size was normal. Wall thickness was   increased in a pattern of mild LVH. Systolic function was normal.   The estimated ejection fraction was in the range of 60% to 65%.   Wall motion was normal; there were no regional wall motion   abnormalities. The study is not technically sufficient to allow   evaluation of LV diastolic function. - Mitral valve: Calcified annulus. Mildly thickened leaflets .   There was mild regurgitation. Valve area by continuity equation   (using LVOT flow): 2.07 cm^2. - Left atrium: The atrium was mildly dilated. - Atrial septum: No defect or patent foramen ovale was identified. TEE-Study Conclusions  - Left ventricle: Systolic function was normal. The estimated   ejection fraction was in the range of 50% to 55%. No evidence of   thrombus. - Ventricular septum: The contour showed a normal configuration.   There was no evidence of a ventricular septal defect. - Mitral valve: There was mild regurgitation. - Left atrium: No formed thrombus. Dense spontaneous contrast No   evidence of thrombus in the atrial cavity or appendage. - Right ventricle: The cavity size was mildly dilated. - Atrial septum: There was a patent foramen ovale. - Impressions: Advanced 3D rendering of the atrial septum was   performed. [place] x 1 150 Joules   Converted from afib rate 110 to NSR rate 80&'s   No immediate neurologic sequela  Stress test- Summary Defect 1:  There  is a medium defect of moderate severity. The defect is reversible.  Overall  Study Impression Myocardial perfusion is abnormal. Findings consistent with ischemia. This is an intermediate risk study. Overall left ventricular systolic function was normal. LV cavity size is normal. Nuclear stress EF: 43%.       Cath report-Conclusion    Normal left main.  Normal left anterior descending coronary artery.  Normal circumflex coronary artery.  Normal dominant RCA.  Normal LV size with normal hemodynamics and estimated ejection fraction of 50%.  False positive myocardial perfusion imaging.  Mild pulmonary hypertension with mean pressure of 27 mmHg.  RECOMMENDATIONS:   Management of atrial fibrillation with aggressive rate control and stroke prophylaxis     Impressions:  - Advanced 3D rendering of the atrial septum was performed. [place] x 1   150 Joules   Converted from afib rate 110 to NSR rate 80&'s   No immediate neurologic sequela Successful cardioversion. No   cardiac source of emboli was indentified.   Assessment and Plan: 1. Persistent afib Flecainide 100 mg bid loaded and with successful cardioversion, but with ERAF Continue diltiazem 120 mg bid Will watch BP/HR   at home and report, feels HR may be up may than usual because she was rushing this am Continue eliquis without missed doses for a chadsvasc score of 3  Will refer for ablation  2. Lifestyle issues Weight loss/ exercise recommended She has untreated sleep apnea 2/2 not being able to tolerate cpap, she is pending sleep study in 1-2 weeks, very important to identify and treat if present to control afib    Appointment to consider ablation requested for Dr. Lawrence Marseilles C. Emie Sommerfeld, [facility] [address] [place], [place] [address] [phone]

## 2017-09-16 ENCOUNTER — Telehealth (HOSPITAL_COMMUNITY): Payer: Self-pay | Admitting: *Deleted

## 2017-09-16 NOTE — Telephone Encounter (Signed)
Pt cld in BP and HR 115/75  HR 88 today  This morning 104/65 HR 92  Yesterday  119/83 HR 89  124/77 HR 84  124/90 at 9:00 pm  Pt now on diltiazem 120 mg twice a day 12 hours apart  Pt reports feeling okay and going back to exercising.  Information given to Roderic Palau, NP and she advised pt to continue current dose.

## 2017-09-20 DIAGNOSIS — M170 Bilateral primary osteoarthritis of knee: Secondary | ICD-10-CM | POA: Diagnosis not present

## 2017-09-20 DIAGNOSIS — M25561 Pain in right knee: Secondary | ICD-10-CM | POA: Diagnosis not present

## 2017-09-20 DIAGNOSIS — M25562 Pain in left knee: Secondary | ICD-10-CM | POA: Diagnosis not present

## 2017-09-29 ENCOUNTER — Encounter: Payer: Self-pay | Admitting: Internal Medicine

## 2017-10-01 ENCOUNTER — Ambulatory Visit (HOSPITAL_BASED_OUTPATIENT_CLINIC_OR_DEPARTMENT_OTHER): Payer: Medicare Other | Attending: Cardiology | Admitting: Cardiology

## 2017-10-01 VITALS — Ht 64.0 in | Wt 250.0 lb

## 2017-10-01 DIAGNOSIS — G4736 Sleep related hypoventilation in conditions classified elsewhere: Secondary | ICD-10-CM | POA: Diagnosis not present

## 2017-10-01 DIAGNOSIS — I4892 Unspecified atrial flutter: Secondary | ICD-10-CM | POA: Insufficient documentation

## 2017-10-01 DIAGNOSIS — I4891 Unspecified atrial fibrillation: Secondary | ICD-10-CM | POA: Diagnosis not present

## 2017-10-01 DIAGNOSIS — G4733 Obstructive sleep apnea (adult) (pediatric): Secondary | ICD-10-CM | POA: Insufficient documentation

## 2017-10-02 ENCOUNTER — Encounter: Payer: Self-pay | Admitting: Internal Medicine

## 2017-10-03 ENCOUNTER — Telehealth: Payer: Self-pay | Admitting: *Deleted

## 2017-10-03 DIAGNOSIS — G4733 Obstructive sleep apnea (adult) (pediatric): Secondary | ICD-10-CM

## 2017-10-03 NOTE — Telephone Encounter (Signed)
to Otho Bellows A Reached out to patient via a telephone call and patient states Dr Toy Cookey said they were going to handle everything just for [name] to send over the patient's sleep study. I will fax her sleep study to Dr fuller's office today.

## 2017-10-04 NOTE — Telephone Encounter (Signed)
Confirmation of sleep study received by Christie Atkinson at Dr Corky Sing office.

## 2017-10-11 ENCOUNTER — Other Ambulatory Visit (HOSPITAL_COMMUNITY): Payer: Self-pay | Admitting: Nurse Practitioner

## 2017-10-15 NOTE — Procedures (Signed)
   Patient Name: Christie Atkinson, Christie Atkinson Study Date:05/24/2017 10/01/2017 Gender: Female D.O.B: [birth year] Age (years): 77 Referring Provider: Fransico Him MD, ABSM Height (inches): 8 Interpreting Physician: Fransico Him MD, ABSM Weight (lbs): 257 RPSGT: Lanae Boast BMI: 56 MRN: [id number] Neck Size: 16.00  CLINICAL INFORMATION Sleep Study Type: NPSG  Indication for sleep study: Obesity, Snoring  Epworth Sleepiness Score: 8  SLEEP STUDY TECHNIQUE As per the AASM Manual for the Scoring of Sleep and Associated Events v2.3 (April 2016) with a hypopnea requiring 4% desaturations.  The channels recorded and monitored were frontal, central and occipital EEG, electrooculogram (EOG), submentalis EMG (chin), nasal and oral airflow, thoracic and abdominal wall motion, anterior tibialis EMG, snore microphone, electrocardiogram, and pulse oximetry.  MEDICATIONS Medications self-administered by patient taken the night of the study : N/A  SLEEP ARCHITECTURE The study was initiated at 9:57:44 PM and ended at 5:01:57 AM.  Sleep onset time was 41.1 minutes and the sleep efficiency was 51.9%%. The total sleep time was 220.2 minutes.  Stage REM latency was 140.5 minutes.  The patient spent 18.5%% of the night in stage N1 sleep, 63.6%% in stage N2 sleep, 0.0%% in stage N3 and 17.94% in REM.  Alpha intrusion was absent.  Supine sleep was 14.60%.  RESPIRATORY PARAMETERS The overall apnea/hypopnea index (AHI) was 20.7 per hour. There were 38 total apneas, including 35 obstructive, 3 central and 0 mixed apneas. There were 38 hypopneas and 10 RERAs.  The AHI during Stage REM sleep was 28.9 per hour.  AHI while supine was 65.3 per hour.  The mean oxygen saturation was 90.5%. The minimum SpO2 during sleep was 77.0%.  moderate snoring was noted during this study.  CARDIAC DATA The 2 lead EKG demonstrated atrial fibrillation. The mean heart rate was 98.6 beats per minute. Other EKG  findings include: None.  LEG MOVEMENT DATA The total PLMS were 0 with a resulting PLMS index of 0.0. Associated arousal with leg movement index was 0.0 .  IMPRESSIONS - Moderate obstructive sleep apnea occurred during this study (AHI = 20.7/h). - No significant central sleep apnea occurred during this study (CAI = 0.8/h). - Moderate oxygen desaturation was noted during this study (Min O2 = 77.0%). - The patient snored with moderate snoring volume. - Atrial fibrillation was noted during this study.  DIAGNOSIS - Obstructive Sleep Apnea (327.23 [G47.33 ICD-10]) - Nocturnal Hypoxemia (327.26 [G47.36 ICD-10])  RECOMMENDATIONS - Therapeutic CPAP titration to determine optimal pressure required to alleviate sleep disordered breathing. - Positional therapy avoiding supine position during sleep. - Avoid alcohol, sedatives and other CNS depressants that may worsen sleep apnea and disrupt normal sleep architecture. - Sleep hygiene should be reviewed to assess factors that may improve sleep quality. - Weight management and regular exercise should be initiated or continued if appropriate.

## 2017-10-17 ENCOUNTER — Encounter: Payer: Self-pay | Admitting: Internal Medicine

## 2017-10-17 ENCOUNTER — Telehealth: Payer: Self-pay | Admitting: *Deleted

## 2017-10-17 ENCOUNTER — Encounter: Payer: Self-pay | Admitting: *Deleted

## 2017-10-17 ENCOUNTER — Ambulatory Visit (INDEPENDENT_AMBULATORY_CARE_PROVIDER_SITE_OTHER): Payer: Medicare Other | Admitting: Internal Medicine

## 2017-10-17 ENCOUNTER — Telehealth: Payer: Self-pay | Admitting: Pharmacist

## 2017-10-17 VITALS — BP 124/80 | HR 104 | Ht 64.0 in | Wt 250.0 lb

## 2017-10-17 DIAGNOSIS — I5032 Chronic diastolic (congestive) heart failure: Secondary | ICD-10-CM | POA: Diagnosis not present

## 2017-10-17 DIAGNOSIS — I481 Persistent atrial fibrillation: Secondary | ICD-10-CM | POA: Diagnosis not present

## 2017-10-17 DIAGNOSIS — G4733 Obstructive sleep apnea (adult) (pediatric): Secondary | ICD-10-CM | POA: Diagnosis not present

## 2017-10-17 DIAGNOSIS — I4819 Other persistent atrial fibrillation: Secondary | ICD-10-CM

## 2017-10-17 NOTE — Telephone Encounter (Signed)
-----   Message from [name], MD sent at 10/15/2017  7:43 PM EDT ----- Please let patient know that they have sleep apnea and recommend CPAP titration. Please set up titration in the sleep lab.

## 2017-10-17 NOTE — Addendum Note (Signed)
Addended by: Freada Bergeron on: 10/17/2017 12:45 PM   Modules accepted: Orders

## 2017-10-17 NOTE — Telephone Encounter (Signed)
Called results LMTCB 

## 2017-10-17 NOTE — Progress Notes (Signed)
PCP: Patient, No Pcp Per Primary Cardiologist: Dr Tamala Julian Primary EP: Dr [name]  Christie Atkinson is a 62 y.o. female who presents today for routine electrophysiology followup.  Since last being seen in our clinic, the patient reports doing reasonably well.  She remains in afib despite flecainide.  + fatigue.  Today, she denies symptoms of palpitations, chest pain, shortness of breath,  lower extremity edema, dizziness, presyncope, or syncope.  The patient is otherwise without complaint today.   Past Medical History:  Diagnosis Date  . Diastolic dysfunction   . Fibromyalgia   . Gestational diabetes   . Persistent atrial fibrillation (Christie Atkinson)   . Sleep apnea    Past Surgical History:  Procedure Laterality Date  . ABDOMINAL HYSTERECTOMY    . APPENDECTOMY    . CARDIOVERSION N/A 07/04/2017   Procedure: CARDIOVERSION;  Surgeon: [name], MD;  Location: [facility] ENDOSCOPY;  Service: Cardiovascular;  Laterality: N/A;  . CARDIOVERSION N/A 09/06/2017   Procedure: CARDIOVERSION;  Surgeon: [name], MD;  Location: [facility] ENDOSCOPY;  Service: Cardiovascular;  Laterality: N/A;  . CHOLECYSTECTOMY N/A 02/24/2015   Procedure: LAPAROSCOPIC CHOLECYSTECTOMY;  Surgeon: [name], MD;  Location: [place];  Service: General;  Laterality: N/A;  . RIGHT/LEFT HEART CATH AND CORONARY ANGIOGRAPHY N/A 08/09/2017   Procedure: RIGHT/LEFT HEART CATH AND CORONARY ANGIOGRAPHY;  Surgeon: [name], MD;  Location: [place] CV LAB;  Service: Cardiovascular;  Laterality: N/A;  . TEE WITHOUT CARDIOVERSION N/A 07/04/2017   Procedure: TRANSESOPHAGEAL ECHOCARDIOGRAM (TEE);  Surgeon: [name], MD;  Location: [facility] ENDOSCOPY;  Service: Cardiovascular;  Laterality: N/A;    ROS- all systems are reviewed and negatives except as per HPI above  Current Outpatient Medications  Medication Sig Dispense Refill  . acetaminophen (TYLENOL) 325 MG tablet Take 325 mg by mouth every 6 (six) hours as needed for moderate pain or  headache.    [name] apixaban (ELIQUIS) 5 MG TABS tablet Take 1 tablet (5 mg total) by mouth 2 (two) times daily. 60 tablet 1  . diltiazem (CARDIZEM CD) 120 MG 24 hr capsule Take 1 capsule (120 mg total) by mouth 2 (two) times daily. 60 capsule   . flecainide (TAMBOCOR) 100 MG tablet TAKE 1 TABLET BY MOUTH TWICE DAILY 60 tablet 3  . furosemide (LASIX) 40 MG tablet Take 1 tablet (40 mg total) by mouth 2 (two) times daily. (Patient taking differently: Take 80 mg by mouth daily. ) 45 tablet 1  . Hyaluronic Acid-Vitamin C (HYALURONIC ACID PO) Take 50 mg by mouth 2 (two) times a week.     . Magnesium 250 MG TABS Take 250 mg by mouth daily.    . Multiple Vitamin (MULTIVITAMIN WITH MINERALS) TABS tablet Take 1 tablet by mouth daily.     . NON FORMULARY Take 1 capsule by mouth daily. Doterra Deep Blue Supplement    . NON FORMULARY Take 1 tablet by mouth daily. Cardiotrophin PMG Supplement    . NON FORMULARY Take 1 tablet by mouth at bedtime. Doterra Serenity Supplement    . NON FORMULARY Take 1 tablet by mouth daily. TriEase Supplement    . OVER THE COUNTER MEDICATION Take 250 mg by mouth daily as needed (for cold symptoms). "Relora" herbal supplement     . OVER THE COUNTER MEDICATION Place 1 drop under the tongue daily as needed (for severe pain). CBD oil     . potassium chloride SA (K-DUR,KLOR-CON) 20 MEQ tablet Take 1 tablet (20 mEq total) by  mouth daily. 90 tablet 2   No current facility-administered medications for this visit.     Physical Exam: Vitals:   10/17/17 1220  BP: 124/80  Pulse: (!) 104  Weight: 250 lb (113.4 kg)  Height: 5\' 4"  (1.626 m)    GEN- The patient is well appearing, alert and oriented x 3 today.   Head- normocephalic, atraumatic Eyes-  Sclera clear, conjunctiva pink Ears- hearing intact Oropharynx- clear Lungs- Clear to ausculation bilaterally, normal work of breathing Heart- irregular rate and rhythm, no murmurs, rubs or gallops, PMI not laterally displaced GI- soft,  NT, ND, + BS Extremities- no clubbing, cyanosis, or edema  EKG tracing ordered today is personally reviewed and shows afib, V rate 104 bpm, nonspecific ST/T changes  Assessment and Plan:  1. Persistent afib Symptomatic She has failed medical therapy with flecainide On eliquis for chads2vasc score of 2. Therapeutic strategies for afib including medicine (tikosyn, amiodarone) and ablation were discussed in detail with the patient today. Risk, benefits, and alternatives to each approach were discussed.  She is not ready to consider ablation.  She would  Prefer to try weight loss and AAD therapy.  I will therefore stop flecainide today.  Admit for tikosyn in 2 weeks (she missed a single dose of eliquis 2 weeks ago).  Could consider ablation if she fails medical therapy with tikosyn.  2. Morbid obesity Body mass index is 42.91 kg/m. Filed Weights   10/17/17 1220  Weight: 250 lb (113.4 kg)  will refer to [name] MD for weight loss/ well ness strategies.  3. OSA Followed by Drs Toy Cookey and Turner  4. Chronic diastolic dysfunction Stable No change required today  Ultimately, she may prefer to follow in [place] with Dr Ileana Ladd MD, [facility] 10/17/2017 1:06 PM

## 2017-10-17 NOTE — Telephone Encounter (Signed)
This encounter was created in error - please disregard.

## 2017-10-17 NOTE — Patient Instructions (Addendum)
Medication Instructions:  Your physician has recommended you make the following change in your medication:  1.  Stop taking flecainide.  Labwork: None ordered.  Testing/Procedures: You will be admitted for dofetilide (Tikosyn) initiation in 2 weeks.  Follow-Up: Will be scheduled after your hospitalization.  Any Other Special Instructions Will Be Listed Below (If Applicable).  You are being referred to [facility] for medical weight management.  If you need a refill on your cardiac medications before your next appointment, please call your pharmacy.   Dofetilide capsules What is this medicine? DOFETILIDE (doe FET il ide) is an antiarrhythmic drug. It helps make your heart beat regularly. This medicine also helps to slow rapid heartbeats. This medicine may be used for other purposes; ask your health care provider or pharmacist if you have questions. COMMON BRAND NAME(S): Tikosyn What should I tell my health care provider before I take this medicine? They need to know if you have any of these conditions: -heart disease -history of low levels of potassium or magnesium -kidney disease -liver disease -an unusual or allergic reaction to dofetilide, other medicines, foods, dyes, or preservatives -pregnant or trying to get pregnant -breast-feeding How should I use this medicine? Take this medicine by mouth with a glass of water. Follow the directions on the prescription label. You can take this medicine with or without food. Do not drink grapefruit juice with this medicine. Take your doses at regular intervals. Do not take your medicine more often than directed. Do not stop taking this medicine suddenly. This may cause serious, heart-related side effects. Your doctor will tell you how much medicine to take. If your doctor wants you to stop the medicine, the dose will be slowly lowered over time to avoid any side effects. Talk to your pediatrician regarding the use of this medicine in  children. Special care may be needed. Overdosage: If you think you have taken too much of this medicine contact a poison control center or emergency room at once. NOTE: This medicine is only for you. Do not share this medicine with others. What if I miss a dose? If you miss a dose, take it as soon as you can. If it is almost time for your next dose, take only that dose. Do not take double or extra doses. What may interact with this medicine? Do not take this medicine with any of the following medications: -cimetidine -dolutegravir -hydrochlorothiazide alone or in combination with other medicines -isavuconazonium -ketoconazole -megestrol -other medicines that prolong the QT interval (cause an abnormal heart rhythm) -prochlorperazine -trimethoprim alone or in combination with sulfamethoxazole -verapamil This medicine may also interact with the following medications: -amiloride -certain antidepressants like fluvoxamine or paroxetine -certain antiviral medicines for HIV or AIDS like atazanavir or darunavir -certain medicines for fungal infections like clotrimazole or miconazole -digoxin -diltiazem -dronabinol, THC -grapefruit juice -metformin -nefazodone -triamterene -zafirlukast This list may not describe all possible interactions. Give your health care provider a list of all the medicines, herbs, non-prescription drugs, or dietary supplements you use. Also tell them if you smoke, drink alcohol, or use illegal drugs. Some items may interact with your medicine. What should I watch for while using this medicine? Visit your doctor or health care professional for regular checks on your progress. Wear a medical ID bracelet or chain, and carry a card that describes your disease and details of your medicine and dosage times. Check your heart rate and blood pressure regularly while you are taking this medicine. Ask your doctor or health  care professional what your heart rate and blood pressure  should be, and when you should contact him or her. Your doctor or health care professional also may schedule regular tests to check your progress. You will be started on this medicine in a specialized facility for at least three days. You will be monitored to find the right dose of medicine for you. It is very important that you take your medicine exactly as prescribed when you leave the hospital. The correct dosing of this medicine is very important to treat your condition and prevent possible serious side effects. What side effects may I notice from receiving this medicine? Side effects that you should report to your doctor or health care professional as soon as possible: -allergic reactions like skin rash, itching or hives, swelling of the face, lips, or tongue -breathing problems -dizziness -fast or rapid beating of the heart -feeling faint or lightheaded -swelling of the ankles -unusually weak or tired -vomiting Side effects that usually do not require medical attention (report to your doctor or health care professional if they continue or are bothersome): -cough -diarrhea -difficulty sleeping -headache -nausea -stomach pain This list may not describe all possible side effects. Call your doctor for medical advice about side effects. You may report side effects to FDA at 1-800-FDA-1088. Where should I keep my medicine? Keep out of the reach of children. Store at room temperature between 15 and 30 degrees C (59 and 86 degrees F). Protect the medicine from moisture or humidity. Keep container tightly closed. Throw away any unused medicine after the expiration date. NOTE: This sheet is a summary. It may not cover all possible information. If you have questions about this medicine, talk to your doctor, pharmacist, or health care provider.  2018 Elsevier/Gold Standard (2016-04-05 62:70:35)

## 2017-10-17 NOTE — Telephone Encounter (Signed)
  Juluis Mire, RN  Freada Bergeron, CMA        Patient called here wanting to know status of sleep study results.  She had a lot of questions/frustrations in regards to communication with Dr. Toy Cookey office for mouth piece?  Please call her and help sort everything out....  Thank you  [facility]

## 2017-10-17 NOTE — Telephone Encounter (Signed)
Patient asks if you know that she is trying to get a mouth guard not a cpap unit. Patient really wants to expedite this process with Dr Augustina Mood.Patietnt has a cpap but she does not use it and will not use it.

## 2017-10-17 NOTE — Telephone Encounter (Signed)
Medication list reviewed in anticipation of upcoming Tikosyn initiation. Patient will need to stop using her CBD oil since this is a strong CYP 3A4 inhibitor and interacts with her diltiazem, Eliquis and Tikosyn. Also recommend stopping her Relora supplement - this contains magnolia which can thin the blood, as well as phellodendron which is a CYP 3A4 inhibitor and interacts with the same medications as above.  Patient is anticoagulated on Eliquis 5mg  BID on the appropriate dose. Please ensure that patient has not missed any anticoagulation doses in the 3 weeks prior to Tikosyn initiation.   Patient will need to be counseled to avoid use of Benadryl while on Tikosyn and in the 2-3 days prior to Tikosyn initiation.

## 2017-10-17 NOTE — Telephone Encounter (Signed)
   Patient asks if you know that she is trying to get a mouth guard not a cpap unit. Patient really wants to expedite this process with Dr Augustina Mood.Patietnt has a cpap but she does not use it and will not use it.

## 2017-10-17 NOTE — Telephone Encounter (Signed)
  [name], MD  Freada Bergeron, CMA        Please let patient know that they have sleep apnea and recommend CPAP titration. Please set up titration in the sleep lab.

## 2017-10-18 ENCOUNTER — Other Ambulatory Visit (HOSPITAL_COMMUNITY): Payer: Self-pay | Admitting: *Deleted

## 2017-10-18 MED ORDER — FUROSEMIDE 40 MG PO TABS: 40.0000 mg | ORAL_TABLET | Freq: Every day | ORAL | 1 refills | Status: DC | PRN

## 2017-10-18 MED ORDER — DILTIAZEM HCL ER COATED BEADS 120 MG PO CP24: 120.0000 mg | ORAL_CAPSULE | Freq: Two times a day (BID) | ORAL | 3 refills | Status: DC

## 2017-10-18 NOTE — Telephone Encounter (Signed)
She needs to see me first

## 2017-10-18 NOTE — Telephone Encounter (Signed)
LMOM for pt to address medication concerns.

## 2017-10-19 ENCOUNTER — Encounter (INDEPENDENT_AMBULATORY_CARE_PROVIDER_SITE_OTHER): Payer: Self-pay

## 2017-10-19 NOTE — Addendum Note (Signed)
Addended by: SUPPLE, MEGAN E on: 10/19/2017 01:49 PM   Modules accepted: Orders

## 2017-10-19 NOTE — Telephone Encounter (Addendum)
Spoke with patient - she is aware to stop using her CBD oil and Relora (she will limit this to once or twice a month because she reports it helps with her allergies). She reported also taking marjoram oil and copaiba oil - she is aware to stop taking her marjoram oil since this can increase her bleeding risk and cause bradycardia.

## 2017-10-20 ENCOUNTER — Telehealth: Payer: Self-pay | Admitting: *Deleted

## 2017-10-20 ENCOUNTER — Telehealth: Payer: Self-pay | Admitting: Cardiovascular Disease

## 2017-10-20 NOTE — Telephone Encounter (Signed)
-----   Message from [name], MD sent at 10/18/2017  9:44 AM EDT ----- Regarding: RE: MOUTH GUARD She has to see me first  Traci ----- Message ----- From: Freada Bergeron, CMA Sent: 10/17/2017   5:41 PM To: [name], MD Subject: MOUTH GUARD                                    Patient asks if you know that she is trying to get a mouth guard not a cpap unit. Patient really wants to expedite this process with Dr Augustina Mood.Patietnt has a cpap but she does not use it and will not use it.  ----- Message ----- From: [name], MD Sent: 10/15/2017   7:43 PM To: Freada Bergeron, CMA  Please let patient know that they have sleep apnea and recommend CPAP titration. Please set up titration in the sleep lab.

## 2017-10-20 NOTE — Telephone Encounter (Signed)
Patient states she  needs an oral device sooner rather than later. Patient will need an office visit with either Dr Radford Pax or Dr Claiborne Billings asap. LMTCB for earliest. Appointment.

## 2017-10-21 ENCOUNTER — Encounter: Payer: Self-pay | Admitting: Cardiovascular Disease

## 2017-10-21 ENCOUNTER — Ambulatory Visit (INDEPENDENT_AMBULATORY_CARE_PROVIDER_SITE_OTHER): Payer: Medicare Other | Admitting: Cardiovascular Disease

## 2017-10-21 VITALS — BP 128/78 | HR 89 | Ht 66.5 in | Wt 249.8 lb

## 2017-10-21 DIAGNOSIS — I4819 Other persistent atrial fibrillation: Secondary | ICD-10-CM

## 2017-10-21 DIAGNOSIS — G4733 Obstructive sleep apnea (adult) (pediatric): Secondary | ICD-10-CM | POA: Diagnosis not present

## 2017-10-21 DIAGNOSIS — I5032 Chronic diastolic (congestive) heart failure: Secondary | ICD-10-CM | POA: Diagnosis not present

## 2017-10-21 DIAGNOSIS — I481 Persistent atrial fibrillation: Secondary | ICD-10-CM

## 2017-10-21 NOTE — Patient Instructions (Signed)
We will obtain letter from Dr. Toy Cookey next week  Follow Up: Dr. Claiborne Billings as needed

## 2017-10-23 ENCOUNTER — Encounter: Payer: Self-pay | Admitting: Cardiovascular Disease

## 2017-10-23 NOTE — Progress Notes (Signed)
Cardiology Office Note    Date:  10/23/2017   ID:  Christie Atkinson, DOB [birth year], MRN [id number]  PCP:  Patient, No Pcp Per  Cardiologist:  Shelva Majestic, MD (sleep); Dr. Tamala Julian  New sleep evaluation  Chief Complaint  Patient presents with  . sleep study    History of Present Illness:  Christie Atkinson is a 62 y.o. female who was added onto my schedule after his sleep study interpreted by Dr. Radford Pax recommended CPAP titration in the patient refused.  The patient has seen Dr. Augustina Mood for consideration of an oral appliance.  She presents for evaluation.  Christie Atkinson has a history of cardiovascular comorbidities including atrial fibrillation, and had undergone TEE guided cardioversion in January 2019.  She has continued to have persistent atrial fibrillation and has been seen in the atrial fibrillation clinic.  The patient also had undergone prior cardiac catheterization after nuclear study suggested anterior and apical perfusion defect.  Cardiac catheterization by Dr. Tamala Julian showed normal coronary arteries.  EF was 50%.  There was mild pulmonary hypertension with a mean pressure of 27.  An echo Doppler study showed an EF of 50-55%.  Following her cardioversion, she reverted back to atrial fibrillation.  She has been on eliquis for anticoagulation.  He continues to be on diltiazem for rate control and recently flecainide was instituted.  Patient reportedly has a history of obstructive sleep apnea and apparently wasn't originally diagnosed in 2004.  She was followed initially by Dr. Carren Rang.  She was on CPAP for approximately 1 year.  She then developed a pneumonia M Blaine CPAP as a cause of her pneumonia.  She has not been on CPAP therapy for at least 15 years.  She was recently referred for a sleep study on 10/01/2017.  This study was interpreted by Dr. Radford Pax.  She had reduced sleep efficiency of 51.9%.  There was moderate sleep apnea with an overall HI 20.7 per hour.  There was  moderately severe sleep apnea during rim sleep with an AHI of 28.9.  Sleep apnea was very severe in the supine position with an AHI of 65.3 per hour.  She had significant oxygen desaturation to 77%.  CPAP titration was recommended.  Apparently, the patient refused to consider this and sought self-referral to Dr. Augustina Mood for consideration of a dental oral appliance.  She had seen Dr. Toy Cookey on 10/12/2017 who would like to proceed with oral appliance therapy.  The patient is adamant that she does not want to use CPAP since she believes this was the cause of her pneumonia in 2004.  She was added onto my schedule today for further evaluation and discussion.  Presently, the patient denies significant excessive daytime sleepiness with an Epworth sleepiness scale score endorsed at 3 in the office today.  She admits to snoring.  Her sleep is nonrestorative.  She admits to being significantly weight in the past.  She intends to lose 100 pounds in for this reason.  States that she does not need CPAP therapy.  She has been in persistent atrial fibrillation in their future plans to initiate Tikosyn treatment.  She also plans to see a nutritionist to assist with weight loss and dieting.   Past Medical History:  Diagnosis Date  . Diastolic dysfunction   . Fibromyalgia   . Gestational diabetes   . Persistent atrial fibrillation ([place])   . Sleep apnea     Past Surgical History:  Procedure Laterality Date  . ABDOMINAL HYSTERECTOMY    .  APPENDECTOMY    . CARDIOVERSION N/A 07/04/2017   Procedure: CARDIOVERSION;  Surgeon: [name], MD;  Location: [facility] ENDOSCOPY;  Service: Cardiovascular;  Laterality: N/A;  . CARDIOVERSION N/A 09/06/2017   Procedure: CARDIOVERSION;  Surgeon: [name], MD;  Location: [facility] ENDOSCOPY;  Service: Cardiovascular;  Laterality: N/A;  . CHOLECYSTECTOMY N/A 02/24/2015   Procedure: LAPAROSCOPIC CHOLECYSTECTOMY;  Surgeon: [name], MD;  Location: [facility];  Service: General;   Laterality: N/A;  . RIGHT/LEFT HEART CATH AND CORONARY ANGIOGRAPHY N/A 08/09/2017   Procedure: RIGHT/LEFT HEART CATH AND CORONARY ANGIOGRAPHY;  Surgeon: [name], MD;  Location: [facility] CV LAB;  Service: Cardiovascular;  Laterality: N/A;  . TEE WITHOUT CARDIOVERSION N/A 07/04/2017   Procedure: TRANSESOPHAGEAL ECHOCARDIOGRAM (TEE);  Surgeon: [name], MD;  Location: [facility] ENDOSCOPY;  Service: Cardiovascular;  Laterality: N/A;    Current Medications: Outpatient Medications Prior to Visit  Medication Sig Dispense Refill  . acetaminophen (TYLENOL) 325 MG tablet Take 325 mg by mouth every 6 (six) hours as needed for moderate pain or headache.    [name] apixaban (ELIQUIS) 5 MG TABS tablet Take 1 tablet (5 mg total) by mouth 2 (two) times daily. 60 tablet 1  . diltiazem (CARDIZEM CD) 120 MG 24 hr capsule Take 1 capsule (120 mg total) by mouth 2 (two) times daily. 60 capsule 3  . furosemide (LASIX) 40 MG tablet Take 1 tablet (40 mg total) by mouth daily as needed for fluid. 45 tablet 1  . Hyaluronic Acid-Vitamin C (HYALURONIC ACID PO) Take 50 mg by mouth 2 (two) times a week.     . Magnesium 250 MG TABS Take 250 mg by mouth daily.    . Multiple Vitamin (MULTIVITAMIN WITH MINERALS) TABS tablet Take 1 tablet by mouth daily.     . NON FORMULARY Take 1 capsule by mouth daily. Doterra Deep Blue Supplement    . NON FORMULARY Take 1 tablet by mouth daily. Cardiotrophin PMG Supplement    . NON FORMULARY Take 1 tablet by mouth at bedtime. Doterra Serenity Supplement    . NON FORMULARY Take 1 tablet by mouth daily. TriEase Supplement    . NON FORMULARY TerraGreens powder    . NON FORMULARY Copaiba oil    . potassium chloride SA (K-DUR,KLOR-CON) 20 MEQ tablet Take 1 tablet (20 mEq total) by mouth daily. 90 tablet 2   No facility-administered medications prior to visit.      Allergies:   Other; Sulfa antibiotics; and Sulfur   Social History   Socioeconomic History  . Marital status: Married     Spouse name: Not on file  . Number of children: Not on file  . Years of education: Not on file  . Highest education level: Not on file  Occupational History  . Not on file  Social Needs  . Financial resource strain: Not on file  . Food insecurity:    Worry: Not on file    Inability: Not on file  . Transportation needs:    Medical: Not on file    Non-medical: Not on file  Tobacco Use  . Smoking status: Never Smoker  . Smokeless tobacco: Never Used  Substance and Sexual Activity  . Alcohol use: Yes    Comment: little  . Drug use: No  . Sexual activity: Not on file  Lifestyle  . Physical activity:    Days per week: Not on file    Minutes per session: Not on file  . Stress: Not on  file  Relationships  . Social connections:    Talks on phone: Not on file    Gets together: Not on file    Attends religious service: Not on file    Active member of club or organization: Not on file    Attends meetings of clubs or organizations: Not on file    Relationship status: Not on file  Other Topics Concern  . Not on file  Social History Narrative   Lives in [place]   Not working    There is no tobacco use.  She states that she walks her dog daily.  She has been trying to walk 1-2 miles a day if possible.  Family History:  The patient's family history includes Heart attack in her father; Heart disease in her father and mother; Hyperlipidemia in her father and mother; Hypertension in her father and mother.  Her mother died as a result of a bleed.  She had diabetes mellitus and heart problems. Her father is significant smoker.  He had suffered myocardial infarction, diabetes mellitus, and had PVD status post amputation.   She has 4 children.  ROS General: Negative; No fevers, chills, or night sweats; positive for significant weight gain since 1996 HEENT: Negative; No changes in vision or hearing, sinus congestion, difficulty swallowing Pulmonary: Negative; No cough, wheezing,  shortness of breath, hemoptysis Cardiovascular: Persistent atrial fibrillation GI: Negative; No nausea, vomiting, diarrhea, or abdominal pain GU: Negative; No dysuria, hematuria, or difficulty voiding Musculoskeletal: Negative; no myalgias, joint pain, or weakness Hematologic/Oncology: Negative; no easy bruising, bleeding Endocrine: Negative; no heat/cold intolerance; no diabetes Neuro: Negative; no changes in balance, headaches Skin: Negative; No rashes or skin lesions Psychiatric: Negative; No behavioral problems, depression Sleep: Negative; No snoring, daytime sleepiness, hypersomnolence, bruxism, restless legs, hypnogognic hallucinations, no cataplexy Other comprehensive 14 point system review is negative.   PHYSICAL EXAM:   VS:  BP 128/78 (BP Location: Left Arm)   Pulse 89   Ht 5' 6.5" (1.689 m)   Wt 249 lb 12.8 oz (113.3 kg)   BMI 39.71 kg/m    Wt Readings from Last 3 Encounters:  10/21/17 249 lb 12.8 oz (113.3 kg)  10/17/17 250 lb (113.4 kg)  10/01/17 250 lb (113.4 kg)    General: Alert, oriented, no distress.  Skin: normal turgor, no rashes, warm and dry HEENT: Normocephalic, atraumatic. Pupils equal round and reactive to light; sclera anicteric; extraocular muscles intact; Fundi no hemorrhages or exudates. Nose without nasal septal hypertrophy Mouth/Parynx benign; Mallinpatti scale 3 Neck: Thick neck ;No JVD, no carotid bruits; normal carotid upstroke Lungs: clear to ausculatation and percussion; no wheezing or rales Chest wall: without tenderness to palpitation Heart: PMI not displaced, RRR, s1 s2 normal, 1/6 systolic murmur, no diastolic murmur, no rubs, gallops, thrills, or heaves Abdomen: Moderate central adiposity; soft, nontender; no hepatosplenomehaly, BS+; abdominal aorta nontender and not dilated by palpation. Back: no CVA tenderness Pulses 2+ Musculoskeletal: full range of motion, normal strength, no joint deformities Extremities: no clubbing cyanosis or  edema, Homan's sign negative  Neurologic: grossly nonfocal; Cranial nerves grossly wnl Psychologic: Normal mood and affect   Studies/Labs Reviewed:   EKG:  EKG is not ordered today.   I have personally reviewed her last ECG from 10/17/2017, which shows atrial fibrillation at 104 bpm.  There is rightward axis.  Recent Labs: BMP Latest Ref Rng & Units 08/31/2017 08/02/2017 07/14/2017  Glucose 65 - 99 mg/dL 97 100(H) 83  BUN 6 - 20 mg/dL 11 24(H) 15  Creatinine 0.44 - 1.00 mg/dL 0.84 1.07(H) 0.91  BUN/Creat Ratio 12 - 28 - - 16  Sodium 135 - 145 mmol/L 137 137 141  Potassium 3.5 - 5.1 mmol/L 4.0 4.0 4.2  Chloride 101 - 111 mmol/L 101 102 102  CO2 22 - 32 mmol/L 23 25 23   Calcium 8.9 - 10.3 mg/dL 9.4 9.3 9.1     Hepatic Function Latest Ref Rng & Units 02/23/2015 02/22/2015  Total Protein 6.5 - 8.1 g/dL 7.5 8.5(H)  Albumin 3.5 - 5.0 g/dL 3.5 4.2  AST 15 - 41 U/L 32 30  ALT 14 - 54 U/L 25 25  Alk Phosphatase 38 - 126 U/L 57 58  Total Bilirubin 0.3 - 1.2 mg/dL 0.9 0.7    CBC Latest Ref Rng & Units 08/31/2017 08/02/2017 06/26/2017  WBC 4.0 - 10.5 K/uL 7.4 8.3 8.6  Hemoglobin 12.0 - 15.0 g/dL 14.0 14.9 12.2  Hematocrit 36.0 - 46.0 % 42.2 43.8 37.2  Platelets 150 - 400 K/uL 207 203 232   Lab Results  Component Value Date   MCV 91.1 08/31/2017   MCV 90.7 08/02/2017   MCV 93.2 06/26/2017   Lab Results  Component Value Date   TSH 4.410 06/24/2017   Lab Results  Component Value Date   HGBA1C 5.8 (H) 06/26/2017     BNP    Component Value Date/Time   BNP 300.1 (H) 06/25/2017 1359    ProBNP    Component Value Date/Time   PROBNP 961 (H) 07/14/2017 1448     Lipid Panel     Component Value Date/Time   CHOL 107 06/26/2017 0517   TRIG 77 06/26/2017 0517   HDL 20 (L) 06/26/2017 0517   CHOLHDL 5.4 06/26/2017 0517   VLDL 15 06/26/2017 0517   LDLCALC 72 06/26/2017 0517     RADIOLOGY: No results found.   Additional studies/ records that were reviewed today include:  I  reviewed the patient's cardiac catheterization, echo Doppler study, sleep study, F/U A fib evaluation with [name], and subsequent dental sleep medicine evaluation by Dr. Augustina Mood.    ASSESSMENT:    1. Persistent atrial fibrillation ([place])   2. Obstructive sleep apnea   3. Chronic diastolic heart failure ([place])   4. Morbid obesity [facility])     PLAN:  Ms. Kelsea Mousel is a 62 year old female who has a history of significant obesity with a body mass index of 39.71 and previous diagnosis of obstructive sleep apnea for at least 15 years, dating back to 2004.  Apparently, she had used CPAP therapy for 1 year, but subsequently developed pneumonia and blamed CPAP for causing her pneumonia.  As result, she is adamant that she will not use CPAP therapy.  She has significant cardiovascular comorbidities and most recently has had issues with persistent atrial fibrillation.  I thoroughly reviewed her most recent sleep study, which again confirms sleep apnea.  Sleep apnea was at least moderate in severity but moderately severe to severe during REM sleep and very severe with supine sleep.  She had significant oxygen desaturation to 77%.  I had a long discussion with her regarding the potential advances of CPAP therapy since her initial encounter, 15 years ago.  I discussed new technology equipment, mass, as well as cleaning opportunities.  I discussed adverse consequences of untreated sleep apnea with reference to cardiovascular health.  She does not have mild sleep apnea, but her sleep apnea is at least moderate but almost severe in REM sleep and very severe  with supine sleep.  I discussed that oral appliance therapy is very beneficial in patients with mild and moderate sleep apnea, but may not be completely effective with severe sleep apnea.  She is adamant that she will not pursue CPAP.  She has already been evaluated by Dr. Augustina Mood for consideration of oral appliance therapy.  This should be  beneficial, but with the severity of her symptoms, it may not completely eliminate her sleep apnea.  She will require positional therapy to avoid supine sleep.  We discussed the importance of weight loss and exercise.  She will need to have follow-up assessment of potential improvement of her sleep apnea with the customized oral appliance device.  Alternatively, if she is completely against CPAP therapy, in the future she may be candidate for nerve stimulation treatment.   Medication Adjustments/Labs and Tests Ordered: Current medicines are reviewed at length with the patient today.  Concerns regarding medicines are outlined above.  Medication changes, Labs and Tests ordered today are listed in the Patient Instructions below. Patient Instructions  We will obtain letter from Dr. Toy Cookey next week  Follow Up: Dr. Claiborne Billings as needed  Time spent: 40 minutes  Signed, Shelva Majestic, MD  10/23/2017 9:19 PM    [facility] [address] [place], [place], [place]  [address] Phone: [phone]

## 2017-10-28 ENCOUNTER — Other Ambulatory Visit (HOSPITAL_COMMUNITY): Payer: Self-pay | Admitting: *Deleted

## 2017-10-28 ENCOUNTER — Telehealth: Payer: Self-pay | Admitting: Internal Medicine

## 2017-10-28 MED ORDER — DILTIAZEM HCL ER COATED BEADS 120 MG PO CP24: 120.0000 mg | ORAL_CAPSULE | Freq: Two times a day (BID) | ORAL | 2 refills | Status: DC

## 2017-10-28 NOTE — Telephone Encounter (Signed)
Left message to discuss

## 2017-10-28 NOTE — Telephone Encounter (Signed)
New Message:        Pt is calling to see if she can push back her tikosyn appt. Pt would like to reschedule for a month out.

## 2017-11-01 NOTE — Telephone Encounter (Signed)
Pt cld back and cancelled appt 11/09/2016. msg was sent to [facility] for scheduling 4 weeks out.  Pt understood that Melissa will call with appt

## 2017-11-02 ENCOUNTER — Encounter (INDEPENDENT_AMBULATORY_CARE_PROVIDER_SITE_OTHER): Payer: Self-pay | Admitting: Family Medicine

## 2017-11-02 ENCOUNTER — Other Ambulatory Visit (INDEPENDENT_AMBULATORY_CARE_PROVIDER_SITE_OTHER): Payer: Self-pay | Admitting: Family Medicine

## 2017-11-02 ENCOUNTER — Ambulatory Visit (INDEPENDENT_AMBULATORY_CARE_PROVIDER_SITE_OTHER): Payer: Medicare Other | Admitting: Family Medicine

## 2017-11-02 VITALS — BP 111/65 | HR 60 | Temp 97.6°F | Ht 65.0 in | Wt 249.0 lb

## 2017-11-02 DIAGNOSIS — I481 Persistent atrial fibrillation: Secondary | ICD-10-CM

## 2017-11-02 DIAGNOSIS — Z0289 Encounter for other administrative examinations: Secondary | ICD-10-CM

## 2017-11-02 DIAGNOSIS — R7303 Prediabetes: Secondary | ICD-10-CM | POA: Diagnosis not present

## 2017-11-02 DIAGNOSIS — R0602 Shortness of breath: Secondary | ICD-10-CM

## 2017-11-02 DIAGNOSIS — Z1331 Encounter for screening for depression: Secondary | ICD-10-CM

## 2017-11-02 DIAGNOSIS — Z6841 Body Mass Index (BMI) 40.0 and over, adult: Secondary | ICD-10-CM | POA: Diagnosis not present

## 2017-11-02 DIAGNOSIS — E66813 Obesity, class 3: Secondary | ICD-10-CM

## 2017-11-02 DIAGNOSIS — E559 Vitamin D deficiency, unspecified: Secondary | ICD-10-CM

## 2017-11-02 DIAGNOSIS — I5032 Chronic diastolic (congestive) heart failure: Secondary | ICD-10-CM | POA: Diagnosis not present

## 2017-11-02 DIAGNOSIS — I480 Paroxysmal atrial fibrillation: Secondary | ICD-10-CM | POA: Diagnosis not present

## 2017-11-02 DIAGNOSIS — I4819 Other persistent atrial fibrillation: Secondary | ICD-10-CM

## 2017-11-02 DIAGNOSIS — R5383 Other fatigue: Secondary | ICD-10-CM | POA: Diagnosis not present

## 2017-11-03 LAB — COMPREHENSIVE METABOLIC PANEL
ALBUMIN: 4.2 g/dL (ref 3.6–4.8)
ALT: 18 IU/L (ref 0–32)
AST: 22 IU/L (ref 0–40)
Albumin/Globulin Ratio: 1.2 (ref 1.2–2.2)
Alkaline Phosphatase: 75 IU/L (ref 39–117)
BUN/Creatinine Ratio: 15 (ref 12–28)
BUN: 13 mg/dL (ref 8–27)
Bilirubin Total: 0.8 mg/dL (ref 0.0–1.2)
CHLORIDE: 103 mmol/L (ref 96–106)
CO2: 21 mmol/L (ref 20–29)
Calcium: 9.2 mg/dL (ref 8.7–10.3)
Creatinine, Ser: 0.87 mg/dL (ref 0.57–1.00)
GFR, EST AFRICAN AMERICAN: 83 mL/min/{1.73_m2} (ref >59)
GFR, EST NON AFRICAN AMERICAN: 72 mL/min/{1.73_m2} (ref >59)
GLUCOSE: 91 mg/dL (ref 65–99)
Globulin, Total: 3.5 g/dL (ref 1.5–4.5)
Potassium: 4.1 mmol/L (ref 3.5–5.2)
Sodium: 140 mmol/L (ref 134–144)
TOTAL PROTEIN: 7.7 g/dL (ref 6.0–8.5)

## 2017-11-03 LAB — CBC WITH DIFFERENTIAL
BASOS ABS: 0 10*3/uL (ref 0.0–0.2)
Basos: 0 %
EOS (ABSOLUTE): 0.1 10*3/uL (ref 0.0–0.4)
Eos: 2 %
HEMOGLOBIN: 14.1 g/dL (ref 11.1–15.9)
Hematocrit: 41.6 % (ref 34.0–46.6)
IMMATURE GRANS (ABS): 0 10*3/uL (ref 0.0–0.1)
Immature Granulocytes: 0 %
LYMPHS: 35 %
Lymphocytes Absolute: 2.6 10*3/uL (ref 0.7–3.1)
MCH: 31.4 pg (ref 26.6–33.0)
MCHC: 33.9 g/dL (ref 31.5–35.7)
MCV: 93 fL (ref 79–97)
MONOCYTES: 10 %
Monocytes Absolute: 0.7 10*3/uL (ref 0.1–0.9)
NEUTROS PCT: 53 %
Neutrophils Absolute: 3.9 10*3/uL (ref 1.4–7.0)
RBC: 4.49 x10E6/uL (ref 3.77–5.28)
RDW: 14.7 % (ref 12.3–15.4)
WBC: 7.4 10*3/uL (ref 3.4–10.8)

## 2017-11-03 LAB — T4, FREE: FREE T4: 0.95 ng/dL (ref 0.82–1.77)

## 2017-11-03 LAB — HEMOGLOBIN A1C
Est. average glucose Bld gHb Est-mCnc: 120 mg/dL
Hgb A1c MFr Bld: 5.8 % — ABNORMAL HIGH (ref 4.8–5.6)

## 2017-11-03 LAB — LIPID PANEL WITH LDL/HDL RATIO
CHOLESTEROL TOTAL: 158 mg/dL (ref 100–199)
HDL: 30 mg/dL — AB (ref >39)
LDL Calculated: 98 mg/dL (ref 0–99)
LDl/HDL Ratio: 3.3 ratio — ABNORMAL HIGH (ref 0.0–3.2)
TRIGLYCERIDES: 151 mg/dL — AB (ref 0–149)
VLDL Cholesterol Cal: 30 mg/dL (ref 5–40)

## 2017-11-03 LAB — INSULIN, RANDOM: INSULIN: 8.5 u[IU]/mL (ref 2.6–24.9)

## 2017-11-03 LAB — T3: T3, Total: 96 ng/dL (ref 71–180)

## 2017-11-03 LAB — TSH: TSH: 3.01 u[IU]/mL (ref 0.450–4.500)

## 2017-11-03 LAB — VITAMIN D 25 HYDROXY (VIT D DEFICIENCY, FRACTURES): Vit D, 25-Hydroxy: 30.9 ng/mL (ref 30.0–100.0)

## 2017-11-03 NOTE — Telephone Encounter (Signed)
She did not want to proceed or reschedule for Tikosyn at this time before talking to [name] about it.   A message has been sent to [facility] to schedule this patient 4 weeks out.

## 2017-11-04 NOTE — Progress Notes (Signed)
.  Office: [phone]  /  Fax: [phone]   HPI:   Chief Complaint: OBESITY  SARAPHINA Atkinson (MR# [id number]) is a 62 y.o. female who presents on 11/02/2017 for obesity evaluation and treatment. Current BMI is Body mass index is 41.44 kg/m.Christie Atkinson has struggled with obesity for years and has been unsuccessful in either losing weight or maintaining long term weight loss. Christie Atkinson attended our information session and states she is currently in the action stage of change and ready to dedicate time achieving and maintaining a healthier weight.  Christie Atkinson was referred by Cardiology.   Christie Atkinson states her family eats meals together she thinks her family will eat healthier with  her her desired weight loss is 99 lbs she started gaining weight at the age of 6 her heaviest weight ever was 270 lbs   Fatigue Christie Atkinson feels her energy is lower than it should be. This has worsened with weight gain and has not worsened recently. Christie Atkinson admits to daytime somnolence and  denies waking up still tired. Patient is at risk for obstructive sleep apnea. Patent has a history of symptoms of daytime fatigue. Patient generally gets 7 hours of sleep per night, and states they generally have generally restful sleep. Snoring is present. Apneic episodes are present. Epworth Sleepiness Score is 4.  Dyspnea on exertion Christie Atkinson notes increasing shortness of breath with exercising and seems to be worsening over time with weight gain. She notes getting out of breath sooner with activity than she used to. This has not gotten worse recently. EKG-Atrial fibrillation. Christie Atkinson denies orthopnea.  Atrial Fibrillation Christie Atkinson was just recently taken off Flecainide. She is trying to avoid ablation.  Vitamin D Deficiency Aniyiah has a diagnosis of vitamin D deficiency. Diagnosis likely due to obesity. She denies nausea, vomiting or muscle weakness.  Pre-Diabetes Christie Atkinson has a diagnosis of pre-diabetes based on her elevated  Hgb A1c and was informed this puts her at greater risk of developing diabetes. Hgb A1c of 5.8 in January 2019. She is not taking metformin currently and continues to work on diet and exercise to decrease risk of diabetes. She denies nausea or hypoglycemia.  Depression Screen Christie Atkinson's Food and Mood (modified PHQ-9) score was  Depression screen PHQ 2/9 11/02/2017  Decreased Interest 2  Down, Depressed, Hopeless 1  PHQ - 2 Score 3  Altered sleeping 1  Tired, decreased energy 3  Change in appetite 0  Feeling bad or failure about yourself  0  Trouble concentrating 0  Moving slowly or fidgety/restless 2  Suicidal thoughts 0  PHQ-9 Score 9  Difficult doing work/chores Somewhat difficult    ALLERGIES: Allergies  Allergen Reactions  . Other Other (See Comments)    Metals Polyester - including hospital gowns and sheets - allergic contact dermatitis  . Sulfa Antibiotics Hives  . Sulfur Hives    MEDICATIONS: Current Outpatient Medications on File Prior to Visit  Medication Sig Dispense Refill  . acetaminophen (TYLENOL) 325 MG tablet Take 325 mg by mouth every 6 (six) hours as needed for moderate pain or headache.    [name] apixaban (ELIQUIS) 5 MG TABS tablet Take 1 tablet (5 mg total) by mouth 2 (two) times daily. 60 tablet 1  . diltiazem (CARDIZEM CD) 120 MG 24 hr capsule Take 1 capsule (120 mg total) by mouth 2 (two) times daily. 180 capsule 2  . furosemide (LASIX) 40 MG tablet Take 1 tablet (40 mg total) by mouth daily as needed for fluid. 45 tablet 1  .  Magnesium 250 MG TABS Take 250 mg by mouth daily.    . Multiple Vitamin (MULTIVITAMIN WITH MINERALS) TABS tablet Take 1 tablet by mouth daily.     . NON FORMULARY Take 1 capsule by mouth daily. Doterra Deep Blue Supplement    . NON FORMULARY Take 1 tablet by mouth daily. Cardiotrophin PMG Supplement    . NON FORMULARY Take 1 tablet by mouth at bedtime. Doterra Serenity Supplement    . NON FORMULARY Take 1 tablet by mouth daily. TriEase  Supplement    . NON FORMULARY TerraGreens powder    . NON FORMULARY Copaiba oil    . NON FORMULARY Cardio For Life    . potassium chloride SA (K-DUR,KLOR-CON) 20 MEQ tablet Take 1 tablet (20 mEq total) by mouth daily. 90 tablet 2   No current facility-administered medications on file prior to visit.     PAST MEDICAL HISTORY: Past Medical History:  Diagnosis Date  . Asthma   . Atrial fibrillation ([place])   . Back pain   . Diastolic dysfunction   . Fibromyalgia   . Gestational diabetes   . Knee pain   . Osteoarthritis   . Persistent atrial fibrillation ([place])   . Sleep apnea     PAST SURGICAL HISTORY: Past Surgical History:  Procedure Laterality Date  . ABDOMINAL HYSTERECTOMY    . APPENDECTOMY    . CARDIOVERSION N/A 07/04/2017   Procedure: CARDIOVERSION;  Surgeon: [name], MD;  Location: [facility] ENDOSCOPY;  Service: Cardiovascular;  Laterality: N/A;  . CARDIOVERSION N/A 09/06/2017   Procedure: CARDIOVERSION;  Surgeon: [name], MD;  Location: [facility]COPY;  Service: Cardiovascular;  Laterality: N/A;  . CHOLECYSTECTOMY N/A 02/24/2015   Procedure: LAPAROSCOPIC CHOLECYSTECTOMY;  Surgeon: [name], MD;  Location: [place];  Service: General;  Laterality: N/A;  . RIGHT/LEFT HEART CATH AND CORONARY ANGIOGRAPHY N/A 08/09/2017   Procedure: RIGHT/LEFT HEART CATH AND CORONARY ANGIOGRAPHY;  Surgeon: [name], MD;  Location: [place] CV LAB;  Service: Cardiovascular;  Laterality: N/A;  . TEE WITHOUT CARDIOVERSION N/A 07/04/2017   Procedure: TRANSESOPHAGEAL ECHOCARDIOGRAM (TEE);  Surgeon: [name], MD;  Location: [facility] ENDOSCOPY;  Service: Cardiovascular;  Laterality: N/A;    SOCIAL HISTORY: Social History   Tobacco Use  . Smoking status: Never Smoker  . Smokeless tobacco: Never Used  Substance Use Topics  . Alcohol use: Yes    Comment: little  . Drug use: No    FAMILY HISTORY: Family History  Problem Relation Age of Onset  . Heart disease Mother   .  Hypertension Mother   . Hyperlipidemia Mother   . Diabetes Mother   . Sudden death Mother   . Thyroid disease Mother   . Obesity Mother   . Heart disease Father   . Heart attack Father   . Hypertension Father   . Hyperlipidemia Father   . Diabetes Father     ROS: Review of Systems  Constitutional: Positive for malaise/fatigue. Negative for weight loss.  HENT: Positive for tinnitus.        + Nasal stuffiness + Hay fever (allergies)  Eyes:       + Floaters  Respiratory: Positive for shortness of breath (with exertion).   Cardiovascular: Negative for orthopnea.       + Very cold feet (at bedtime)  Gastrointestinal: Negative for nausea and vomiting.  Genitourinary: Positive for frequency.  Musculoskeletal: Positive for back pain.       Negative muscle weakness  Neurological: Positive  for weakness.  Endo/Heme/Allergies:       Negative hypoglycemia    PHYSICAL EXAM: Blood pressure 111/65, pulse 60, temperature 97.6 F (36.4 C), temperature source Oral, height 5\' 5"  (1.651 m), weight 249 lb (112.9 kg), SpO2 97 %. Body mass index is 41.44 kg/m. Physical Exam  Constitutional: She is oriented to person, place, and time. She appears well-developed and well-nourished.  HENT:  Head: Normocephalic and atraumatic.  Nose: Nose normal.  Eyes: EOM are normal. No scleral icterus.  Neck: Normal range of motion. Neck supple. No thyromegaly present.  Cardiovascular: Normal rate and regular rhythm.  Pulmonary/Chest: Effort normal. No respiratory distress.  Abdominal: Soft. There is no tenderness.  + Obesity  Musculoskeletal:  Range of Motion normal in all 4 extremities Trace edema noted in bilateral lower extremites  Neurological: She is alert and oriented to person, place, and time. Coordination normal.  Skin: Skin is warm and dry.  Psychiatric: She has a normal mood and affect. Her behavior is normal.  Vitals reviewed.   RECENT LABS AND TESTS: BMET    Component Value Date/Time    NA 140 11/02/2017 1254   K 4.1 11/02/2017 1254   CL 103 11/02/2017 1254   CO2 21 11/02/2017 1254   GLUCOSE 91 11/02/2017 1254   GLUCOSE 97 08/31/2017 0930   BUN 13 11/02/2017 1254   CREATININE 0.87 11/02/2017 1254   CALCIUM 9.2 11/02/2017 1254   GFRNONAA 72 11/02/2017 1254   GFRAA 83 11/02/2017 1254   Lab Results  Component Value Date   HGBA1C 5.8 (H) 11/02/2017   Lab Results  Component Value Date   INSULIN 8.5 11/02/2017   CBC    Component Value Date/Time   WBC 7.4 11/02/2017 1254   WBC 7.4 08/31/2017 0930   RBC 4.49 11/02/2017 1254   RBC 4.63 08/31/2017 0930   HGB 14.1 11/02/2017 1254   HCT 41.6 11/02/2017 1254   PLT 207 08/31/2017 0930   PLT 247 06/24/2017 1046   MCV 93 11/02/2017 1254   MCH 31.4 11/02/2017 1254   MCH 30.2 08/31/2017 0930   MCHC 33.9 11/02/2017 1254   MCHC 33.2 08/31/2017 0930   RDW 14.7 11/02/2017 1254   LYMPHSABS 2.6 11/02/2017 1254   MONOABS 1.2 (H) 02/23/2015 0619   EOSABS 0.1 11/02/2017 1254   BASOSABS 0.0 11/02/2017 1254   Iron/TIBC/Ferritin/ %Sat No results found for: IRON, TIBC, FERRITIN, IRONPCTSAT Lipid Panel     Component Value Date/Time   CHOL 158 11/02/2017 1304   TRIG 151 (H) 11/02/2017 1304   HDL 30 (L) 11/02/2017 1304   CHOLHDL 5.4 06/26/2017 0517   VLDL 15 06/26/2017 0517   LDLCALC 98 11/02/2017 1304   Hepatic Function Panel     Component Value Date/Time   PROT 7.7 11/02/2017 1254   ALBUMIN 4.2 11/02/2017 1254   AST 22 11/02/2017 1254   ALT 18 11/02/2017 1254   ALKPHOS 75 11/02/2017 1254   BILITOT 0.8 11/02/2017 1254      Component Value Date/Time   TSH 3.010 11/02/2017 1254   Vitamin D No recent labs  ECG  shows NSR with a rate of 104 BPM INDIRECT CALORIMETER done today shows a VO2 of 298 and a REE of 2073. Her calculated basal metabolic rate is 5093 thus her basal metabolic rate is better than expected.    ASSESSMENT AND PLAN: Other fatigue - Plan: CBC With Differential, Comprehensive metabolic  panel, Insulin, random, Lipid Panel With LDL/HDL Ratio, T3, T4, free, TSH  Shortness of  breath on exertion  Persistent atrial fibrillation (HCC)  Vitamin D deficiency - Plan: VITAMIN D 25 Hydroxy (Vit-D Deficiency, Fractures)  Prediabetes - Plan: Hemoglobin A1c  Depression screening  Class 3 severe obesity with serious comorbidity and body mass index (BMI) of 40.0 to 44.9 in adult, unspecified obesity type ([place])  PLAN:  Fatigue Carrianne was informed that her fatigue may be related to obesity, depression or many other causes. Labs will be ordered, and in the meanwhile Hinata has agreed to work on diet, exercise and weight loss to help with fatigue. Proper sleep hygiene was discussed including the need for 7-8 hours of quality sleep each night. A sleep study was not ordered based on symptoms and Epworth score.  Dyspnea on exertion Paden's shortness of breath appears to be obesity related and exercise induced. She has agreed to work on weight loss and gradually increase exercise to treat her exercise induced shortness of breath. If Breslyn follows our instructions and loses weight without improvement of her shortness of breath, we will plan to refer to pulmonology. We will monitor this condition regularly. Sharlene agrees to this plan.  Atrial Fibrillation Jahira agrees to continue current medications and she agrees to follow up with our clinic in 2 weeks.  Vitamin D Deficiency Bethanee was informed that low vitamin D levels contributes to fatigue and are associated with obesity, breast, and colon cancer. She will follow up for routine testing of vitamin D, at least 2-3 times per year. She was informed of the risk of over-replacement of vitamin D and agrees to not increase her dose unless she discusses this with [name] first. We will check labs today and Ceclia agrees to follow up with our clinic in 2 weeks.  Pre-Diabetes Joline will continue to work on weight loss, exercise, and decreasing  simple carbohydrates in her diet to help decrease the risk of diabetes. We dicussed metformin including benefits and risks. She was informed that eating too many simple carbohydrates or too many calories at one sitting increases the likelihood of GI side effects. Joyanna declined metformin for now and a prescription was not written today. We will check labs today and Jaleesa agrees to follow up with our clinic in 2 weeks as directed to monitor her progress.  Depression Screen Devonne had a mildly positive depression screening. Depression is commonly associated with obesity and often results in emotional eating behaviors. We will monitor this closely and work on CBT to help improve the non-hunger eating patterns. Referral to Psychology may be required if no improvement is seen as she continues in our clinic.  Obesity Lorayne is currently in the action stage of change and her goal is to continue with weight loss efforts She has agreed to follow the Category 2 plan + a specific 200 calories extra Manie has been instructed to work up to a goal of 150 minutes of combined cardio and strengthening exercise per week for weight loss and overall health benefits. We discussed the following Behavioral Modification Strategies today: increasing lean protein intake, increasing vegetables, work on meal planning and easy cooking plans, and planning for success  Monserrate has agreed to follow up with our clinic in 2 weeks. She was informed of the importance of frequent follow up visits to maximize her success with intensive lifestyle modifications for her multiple health conditions. She was informed we would discuss her lab results at her next visit unless there is a critical issue that needs to be addressed sooner. Shawntavia agreed to keep her  next visit at the agreed upon time to discuss these results.    OBESITY BEHAVIORAL INTERVENTION VISIT  Today's visit was # 1 out of 22.  Starting weight: 249 lbs Starting  date: 11/02/17 Today's weight : 249 lbs  Today's date: 11/02/2017 Total lbs lost to date: 0 (Patients must lose 7 lbs in the first 6 months to continue with counseling)   ASK: We discussed the diagnosis of obesity with [name] today and Ela agreed to give [name] permission to discuss obesity behavioral modification therapy today.  ASSESS: Ester has the diagnosis of obesity and her BMI today is 41.44 Holle is in the action stage of change   ADVISE: Dorella was educated on the multiple health risks of obesity as well as the benefit of weight loss to improve her health. She was advised of the need for long term treatment and the importance of lifestyle modifications.  AGREE: Multiple dietary modification options and treatment options were discussed and  Naveyah agreed to the above obesity treatment plan.   I, Trixie Dredge, am acting as transcriptionist for Ilene Qua, MD   I have reviewed the above documentation for accuracy and completeness, and I agree with the above. - Ilene Qua, MD

## 2017-11-08 ENCOUNTER — Ambulatory Visit (HOSPITAL_COMMUNITY): Payer: Medicare Other | Admitting: Nurse Practitioner

## 2017-11-16 ENCOUNTER — Other Ambulatory Visit (INDEPENDENT_AMBULATORY_CARE_PROVIDER_SITE_OTHER): Payer: Self-pay | Admitting: Family Medicine

## 2017-11-16 ENCOUNTER — Ambulatory Visit (INDEPENDENT_AMBULATORY_CARE_PROVIDER_SITE_OTHER): Payer: Medicare Other | Admitting: Family Medicine

## 2017-11-16 VITALS — BP 106/74 | HR 65 | Temp 97.5°F | Ht 65.0 in | Wt 240.0 lb

## 2017-11-16 DIAGNOSIS — E559 Vitamin D deficiency, unspecified: Secondary | ICD-10-CM

## 2017-11-16 DIAGNOSIS — Z6841 Body Mass Index (BMI) 40.0 and over, adult: Secondary | ICD-10-CM

## 2017-11-16 DIAGNOSIS — R7303 Prediabetes: Secondary | ICD-10-CM

## 2017-11-16 MED ORDER — VITAMIN D (ERGOCALCIFEROL) 1.25 MG (50000 UNIT) PO CAPS: 50000.0000 [IU] | ORAL_CAPSULE | ORAL | 0 refills | Status: DC

## 2017-11-16 NOTE — Progress Notes (Signed)
Office: [phone]  /  Fax: [phone]   HPI:   Chief Complaint: OBESITY Christie Atkinson is here to discuss her progress with her obesity treatment plan. She is on the Category 2 plan + a specific 200 calories extra and is following her eating plan approximately 95 % of the time. She states she is exercising 0 minutes 0 times per week. Christie Atkinson still did intermittent fasting but got all food in during time of eating. Didn't like the bread so changed it out for flat bread she had.  Her weight is 240 lb (108.9 kg) today and has had a weight loss of 9 pounds over a period of 2 weeks since her last visit. She has lost 9 lbs since starting treatment with [name].  Pre-Diabetes Christie Atkinson has a diagnosis of pre-diabetes based on her elevated Hgb A1c and was informed this puts her at greater risk of developing diabetes. Hgb A1c of 5.8 and insulin of 8.5, Hgb A1c of 5.8 since January of 2019. She is not taking metformin currently and continues to work on diet and exercise to decrease risk of diabetes. She denies nausea or hypoglycemia.  Vitamin D Deficiency Christie Atkinson has a diagnosis of vitamin D deficiency. She is unsure how much she is taking if in OTC (if any). She denies nausea, vomiting or muscle weakness.  ALLERGIES: Allergies  Allergen Reactions  . Other Other (See Comments)    Metals Polyester - including hospital gowns and sheets - allergic contact dermatitis  . Sulfa Antibiotics Hives  . Sulfur Hives    MEDICATIONS: Current Outpatient Medications on File Prior to Visit  Medication Sig Dispense Refill  . acetaminophen (TYLENOL) 325 MG tablet Take 325 mg by mouth every 6 (six) hours as needed for moderate pain or headache.    [name] apixaban (ELIQUIS) 5 MG TABS tablet Take 1 tablet (5 mg total) by mouth 2 (two) times daily. 60 tablet 1  . diltiazem (CARDIZEM CD) 120 MG 24 hr capsule Take 1 capsule (120 mg total) by mouth 2 (two) times daily. 180 capsule 2  . furosemide (LASIX) 40 MG tablet Take 1  tablet (40 mg total) by mouth daily as needed for fluid. 45 tablet 1  . Magnesium 250 MG TABS Take 250 mg by mouth daily.    . Multiple Vitamin (MULTIVITAMIN WITH MINERALS) TABS tablet Take 1 tablet by mouth daily.     . NON FORMULARY Take 1 capsule by mouth daily. Doterra Deep Blue Supplement    . NON FORMULARY Take 1 tablet by mouth daily. Cardiotrophin PMG Supplement    . NON FORMULARY Take 1 tablet by mouth at bedtime. Doterra Serenity Supplement    . NON FORMULARY Take 1 tablet by mouth daily. TriEase Supplement    . NON FORMULARY TerraGreens powder    . NON FORMULARY Copaiba oil    . NON FORMULARY Cardio For Life    . potassium chloride SA (K-DUR,KLOR-CON) 20 MEQ tablet Take 1 tablet (20 mEq total) by mouth daily. 90 tablet 2   No current facility-administered medications on file prior to visit.     PAST MEDICAL HISTORY: Past Medical History:  Diagnosis Date  . Asthma   . Atrial fibrillation ([place])   . Back pain   . Diastolic dysfunction   . Fibromyalgia   . Gestational diabetes   . Knee pain   . Osteoarthritis   . Persistent atrial fibrillation ([place])   . Sleep apnea     PAST SURGICAL HISTORY: Past Surgical History:  Procedure Laterality Date  . ABDOMINAL HYSTERECTOMY    . APPENDECTOMY    . CARDIOVERSION N/A 07/04/2017   Procedure: CARDIOVERSION;  Surgeon: [name], MD;  Location[facility] ENDOSCOPY;  Service: Cardiovascular;  Laterality: N/A;  . CARDIOVERSION N/A 09/06/2017   Procedure: CARDIOVERSION;  Surgeon: [name], MD;  Location: [facility] ENDOSCOPY;  Service: Cardiovascular;  Laterality: N/A;  . CHOLECYSTECTOMY N/A 02/24/2015   Procedure: LAPAROSCOPIC CHOLECYSTECTOMY;  Surgeon: [name], MD;  Location: [place];  Service: General;  Laterality: N/A;  . RIGHT/LEFT HEART CATH AND CORONARY ANGIOGRAPHY N/A 08/09/2017   Procedure: RIGHT/LEFT HEART CATH AND CORONARY ANGIOGRAPHY;  Surgeon: [name], MD;  Location: [place] CV LAB;  Service: Cardiovascular;   Laterality: N/A;  . TEE WITHOUT CARDIOVERSION N/A 07/04/2017   Procedure: TRANSESOPHAGEAL ECHOCARDIOGRAM (TEE);  Surgeon: [name], MD;  Location: [facility] ENDOSCOPY;  Service: Cardiovascular;  Laterality: N/A;    SOCIAL HISTORY: Social History   Tobacco Use  . Smoking status: Never Smoker  . Smokeless tobacco: Never Used  Substance Use Topics  . Alcohol use: Yes    Comment: little  . Drug use: No    FAMILY HISTORY: Family History  Problem Relation Age of Onset  . Heart disease Mother   . Hypertension Mother   . Hyperlipidemia Mother   . Diabetes Mother   . Sudden death Mother   . Thyroid disease Mother   . Obesity Mother   . Heart disease Father   . Heart attack Father   . Hypertension Father   . Hyperlipidemia Father   . Diabetes Father     ROS: Review of Systems  Constitutional: Positive for malaise/fatigue.  Gastrointestinal: Negative for nausea and vomiting.  Musculoskeletal:       Negative muscle weakness  Endo/Heme/Allergies:       Negative hypoglycemia    PHYSICAL EXAM: Blood pressure 106/74, pulse 65, temperature (!) 97.5 F (36.4 C), temperature source Oral, height 5\' 5"  (1.651 m), weight 240 lb (108.9 kg), SpO2 97 %. Body mass index is 39.94 kg/m. Physical Exam  Constitutional: She is oriented to person, place, and time. She appears well-developed and well-nourished.  Cardiovascular: Normal rate.  Pulmonary/Chest: Effort normal.  Musculoskeletal: Normal range of motion.  Neurological: She is oriented to person, place, and time.  Skin: Skin is warm and dry.  Psychiatric: She has a normal mood and affect. Her behavior is normal.  Vitals reviewed.   RECENT LABS AND TESTS: BMET    Component Value Date/Time   NA 140 11/02/2017 1254   K 4.1 11/02/2017 1254   CL 103 11/02/2017 1254   CO2 21 11/02/2017 1254   GLUCOSE 91 11/02/2017 1254   GLUCOSE 97 08/31/2017 0930   BUN 13 11/02/2017 1254   CREATININE 0.87 11/02/2017 1254   CALCIUM 9.2  11/02/2017 1254   GFRNONAA 72 11/02/2017 1254   GFRAA 83 11/02/2017 1254   Lab Results  Component Value Date   HGBA1C 5.8 (H) 11/02/2017   HGBA1C 5.8 (H) 06/26/2017   Lab Results  Component Value Date   INSULIN 8.5 11/02/2017   CBC    Component Value Date/Time   WBC 7.4 11/02/2017 1254   WBC 7.4 08/31/2017 0930   RBC 4.49 11/02/2017 1254   RBC 4.63 08/31/2017 0930   HGB 14.1 11/02/2017 1254   HCT 41.6 11/02/2017 1254   PLT 207 08/31/2017 0930   PLT 247 06/24/2017 1046   MCV 93 11/02/2017 1254   MCH 31.4 11/02/2017 1254  MCH 30.2 08/31/2017 0930   MCHC 33.9 11/02/2017 1254   MCHC 33.2 08/31/2017 0930   RDW 14.7 11/02/2017 1254   LYMPHSABS 2.6 11/02/2017 1254   MONOABS 1.2 (H) 02/23/2015 0619   EOSABS 0.1 11/02/2017 1254   BASOSABS 0.0 11/02/2017 1254   Iron/TIBC/Ferritin/ %Sat No results found for: IRON, TIBC, FERRITIN, IRONPCTSAT Lipid Panel     Component Value Date/Time   CHOL 158 11/02/2017 1304   TRIG 151 (H) 11/02/2017 1304   HDL 30 (L) 11/02/2017 1304   CHOLHDL 5.4 06/26/2017 0517   VLDL 15 06/26/2017 0517   LDLCALC 98 11/02/2017 1304   Hepatic Function Panel     Component Value Date/Time   PROT 7.7 11/02/2017 1254   ALBUMIN 4.2 11/02/2017 1254   AST 22 11/02/2017 1254   ALT 18 11/02/2017 1254   ALKPHOS 75 11/02/2017 1254   BILITOT 0.8 11/02/2017 1254      Component Value Date/Time   TSH 3.010 11/02/2017 1254   TSH 4.410 06/24/2017 1046   TSH 1.260 02/22/2015 1954  Results for LORALYN, RACHEL (MRN [id number]) as of 11/16/2017 16:01  Ref. Range 11/02/2017 12:54  Vitamin D, 25-Hydroxy Latest Ref Range: 30.0 - 100.0 ng/mL 30.9    ASSESSMENT AND PLAN: Prediabetes  Vitamin D deficiency - Plan: Vitamin D, Ergocalciferol, (DRISDOL) 50000 units CAPS capsule  Class 3 severe obesity with serious comorbidity and body mass index (BMI) of 40.0 to 44.9 in adult, unspecified obesity type ([place])  PLAN:  Pre-Diabetes Christie Atkinson will continue Category 2  plan + 200 calories, and she will continue to work on weight loss, exercise, and decreasing simple carbohydrates in her diet to help decrease the risk of diabetes. We dicussed metformin including benefits and risks. She was informed that eating too many simple carbohydrates or too many calories at one sitting increases the likelihood of GI side effects. Christie Atkinson declined metformin for now and a prescription was not written today. We will retest in 3 months and Christie Atkinson agrees to follow up with our clinic in 2 weeks as directed to monitor her progress.  Vitamin D Deficiency Christie Atkinson was informed that low vitamin D levels contributes to fatigue and are associated with obesity, breast, and colon cancer. Christie Atkinson agrees to start prescription Vit D @50 ,000 IU every week #4 with no refills. She will follow up for routine testing of vitamin D, at least 2-3 times per year. She was informed of the risk of over-replacement of vitamin D and agrees to not increase her dose unless she discusses this with [name] first. Christie Atkinson agrees to follow up with our clinic in 2 weeks.  Obesity Christie Atkinson is currently in the action stage of change. As such, her goal is to continue with weight loss efforts She has agreed to follow the Category 2 plan + specific 200 calories Christie Atkinson has been instructed to work up to a goal of 150 minutes of combined cardio and strengthening exercise per week for weight loss and overall health benefits. We discussed the following Behavioral Modification Strategies today: increasing lean protein intake, increasing vegetables, work on meal planning and easy cooking plans, and planning for success   Christie Atkinson has agreed to follow up with our clinic in 2 weeks. She was informed of the importance of frequent follow up visits to maximize her success with intensive lifestyle modifications for her multiple health conditions.   OBESITY BEHAVIORAL INTERVENTION VISIT  Today's visit was # 2 out of 22.  Starting  weight: 249 lbs Starting date: 11/02/17 Today's  weight : 240 lbs Today's date: 11/16/2017 Total lbs lost to date: 9 (Patients must lose 7 lbs in the first 6 months to continue with counseling)   ASK: We discussed the diagnosis of obesity with Christie Atkinson today and Christie Atkinson agreed to give [name] permission to discuss obesity behavioral modification therapy today.  ASSESS: Christie Atkinson has the diagnosis of obesity and her BMI today is 39.94 Christie Atkinson is in the action stage of change   ADVISE: Christie Atkinson was educated on the multiple health risks of obesity as well as the benefit of weight loss to improve her health. She was advised of the need for long term treatment and the importance of lifestyle modifications.  AGREE: Multiple dietary modification options and treatment options were discussed and  Christie Atkinson agreed to the above obesity treatment plan.  I, Trixie Dredge, am acting as transcriptionist for Ilene Qua, MD  I have reviewed the above documentation for accuracy and completeness, and I agree with the above. - Ilene Qua, MD

## 2017-11-23 ENCOUNTER — Encounter: Payer: Self-pay | Admitting: Internal Medicine

## 2017-11-23 ENCOUNTER — Ambulatory Visit (INDEPENDENT_AMBULATORY_CARE_PROVIDER_SITE_OTHER): Payer: Medicare Other | Admitting: Internal Medicine

## 2017-11-23 VITALS — BP 122/72 | HR 101 | Ht 65.0 in | Wt 244.0 lb

## 2017-11-23 DIAGNOSIS — I5032 Chronic diastolic (congestive) heart failure: Secondary | ICD-10-CM | POA: Diagnosis not present

## 2017-11-23 DIAGNOSIS — I481 Persistent atrial fibrillation: Secondary | ICD-10-CM | POA: Diagnosis not present

## 2017-11-23 DIAGNOSIS — G4733 Obstructive sleep apnea (adult) (pediatric): Secondary | ICD-10-CM

## 2017-11-23 DIAGNOSIS — I4819 Other persistent atrial fibrillation: Secondary | ICD-10-CM

## 2017-11-23 NOTE — Patient Instructions (Addendum)
Medication Instructions:  Your physician recommends that you continue on your current medications as directed. Please refer to the Current Medication list given to you today.  Labwork: None ordered.  Testing/Procedures: None ordered.  Follow-Up: Your physician wants you to follow-up in: August with Roderic Palau NP at the afib clinic.  Any Other Special Instructions Will Be Listed Below (If Applicable).  If you need a refill on your cardiac medications before your next appointment, please call your pharmacy.

## 2017-11-23 NOTE — Progress Notes (Signed)
PCP: Patient, No Pcp Per Primary Cardiologist: Dr Tamala Julian Primary EP: Dr [name]  Christie Atkinson is a 62 y.o. female who presents today for routine electrophysiology followup.  Since last being seen in our clinic, the patient reports doing very well.  She is working with the weight loss center and is making progress.  Remains in afib.  Has not yet decided to proceed with tikosyn.  Today, she denies symptoms of palpitations, chest pain, shortness of breath,  lower extremity edema, dizziness, presyncope, or syncope.  The patient is otherwise without complaint today.   Past Medical History:  Diagnosis Date  . Asthma   . Atrial fibrillation ([place])   . Back pain   . Diastolic dysfunction   . Fibromyalgia   . Gestational diabetes   . Knee pain   . Osteoarthritis   . Persistent atrial fibrillation ([place])   . Sleep apnea    Past Surgical History:  Procedure Laterality Date  . ABDOMINAL HYSTERECTOMY    . APPENDECTOMY    . CARDIOVERSION N/A 07/04/2017   Procedure: CARDIOVERSION;  Surgeon: [name], MD;  Location: [facility] ENDOSCOPY;  Service: Cardiovascular;  Laterality: N/A;  . CARDIOVERSION N/A 09/06/2017   Procedure: CARDIOVERSION;  Surgeon: [name], MD;  Location: [facility] ENDOSCOPY;  Service: Cardiovascular;  Laterality: N/A;  . CHOLECYSTECTOMY N/A 02/24/2015   Procedure: LAPAROSCOPIC CHOLECYSTECTOMY;  Surgeon: [name], MD;  Location: [place];  Service: General;  Laterality: N/A;  . RIGHT/LEFT HEART CATH AND CORONARY ANGIOGRAPHY N/A 08/09/2017   Procedure: RIGHT/LEFT HEART CATH AND CORONARY ANGIOGRAPHY;  Surgeon: [name], MD;  Location: [place] CV LAB;  Service: Cardiovascular;  Laterality: N/A;  . TEE WITHOUT CARDIOVERSION N/A 07/04/2017   Procedure: TRANSESOPHAGEAL ECHOCARDIOGRAM (TEE);  Surgeon: [name], MD;  Location: [facility] ENDOSCOPY;  Service: Cardiovascular;  Laterality: N/A;    ROS- all systems are reviewed and negatives except as per HPI above  Current  Outpatient Medications  Medication Sig Dispense Refill  . acetaminophen (TYLENOL) 325 MG tablet Take 325 mg by mouth every 6 (six) hours as needed for moderate pain or headache.    [name] apixaban (ELIQUIS) 5 MG TABS tablet Take 1 tablet (5 mg total) by mouth 2 (two) times daily. 60 tablet 1  . diltiazem (CARDIZEM CD) 120 MG 24 hr capsule Take 1 capsule (120 mg total) by mouth 2 (two) times daily. 180 capsule 2  . furosemide (LASIX) 40 MG tablet Take 1 tablet (40 mg total) by mouth daily as needed for fluid. 45 tablet 1  . Magnesium 250 MG TABS Take 250 mg by mouth daily.    . Multiple Vitamin (MULTIVITAMIN WITH MINERALS) TABS tablet Take 1 tablet by mouth daily.     . NON FORMULARY Take 1 capsule by mouth daily. Doterra Deep Blue Supplement    . NON FORMULARY Take 1 tablet by mouth daily. Cardiotrophin PMG Supplement    . NON FORMULARY Take 1 tablet by mouth at bedtime. Doterra Serenity Supplement    . NON FORMULARY Take 1 tablet by mouth daily. TriEase Supplement    . NON FORMULARY TerraGreens powder    . NON FORMULARY Copaiba oil    . NON FORMULARY Cardio For Life    . potassium chloride SA (K-DUR,KLOR-CON) 20 MEQ tablet Take 1 tablet (20 mEq total) by mouth daily. 90 tablet 2  . Vitamin D, Ergocalciferol, (DRISDOL) 50000 units CAPS capsule Take 1 capsule (50,000 Units total) by mouth every 7 (seven) days. 4  capsule 0   No current facility-administered medications for this visit.     Physical Exam: Vitals:   11/23/17 1532  BP: 122/72  Pulse: (!) 101  Weight: 244 lb (110.7 kg)  Height: 5\' 5"  (1.651 m)    GEN- The patient is overweight appearing, alert and oriented x 3 today.   Head- normocephalic, atraumatic Eyes-  Sclera clear, conjunctiva pink Ears- hearing intact Oropharynx- clear Lungs- Clear to ausculation bilaterally, normal work of breathing Heart- irregular rate and rhythm, no murmurs, rubs or gallops, PMI not laterally displaced GI- soft, NT, ND, + BS Extremities- no  clubbing, cyanosis, or edema  Wt Readings from Last 3 Encounters:  11/23/17 244 lb (110.7 kg)  11/16/17 240 lb (108.9 kg)  11/02/17 249 lb (112.9 kg)    EKG tracing ordered today is personally reviewed and shows afib, rate controlled  Assessment and Plan:  1. Persistent afib We discussed tikosyn admit at length today. She wishes to proceed with she returns from vacation in early august. The importance of compliance with eliquis was discussed at length today. Will plan admit for tikosyn in early august  2. Obesity Body mass index is 40.6 kg/m. Wt Readings from Last 3 Encounters:  11/23/17 244 lb (110.7 kg)  11/16/17 240 lb (108.9 kg)  11/02/17 249 lb (112.9 kg)  she is working diligently on lifestyle  3. OSA Followed with Drs Toy Cookey and turner  4. Chronic diastolic dysfunction Stable No change required today  Follow-up in AF clinic in early August for consideration of tikosyn.  Ultimately, she may prefer to follow in [place] with Dr Ileana Ladd MD, [facility] 11/23/2017 3:59 PM

## 2017-11-30 ENCOUNTER — Ambulatory Visit (INDEPENDENT_AMBULATORY_CARE_PROVIDER_SITE_OTHER): Payer: Medicare Other | Admitting: Physician Assistant

## 2017-12-01 ENCOUNTER — Ambulatory Visit (INDEPENDENT_AMBULATORY_CARE_PROVIDER_SITE_OTHER): Payer: Medicare Other | Admitting: Family Medicine

## 2017-12-01 VITALS — BP 102/69 | HR 69 | Temp 97.5°F | Ht 65.0 in | Wt 240.0 lb

## 2017-12-01 DIAGNOSIS — E559 Vitamin D deficiency, unspecified: Secondary | ICD-10-CM | POA: Diagnosis not present

## 2017-12-01 DIAGNOSIS — I480 Paroxysmal atrial fibrillation: Secondary | ICD-10-CM | POA: Diagnosis not present

## 2017-12-01 DIAGNOSIS — Z6841 Body Mass Index (BMI) 40.0 and over, adult: Secondary | ICD-10-CM

## 2017-12-01 DIAGNOSIS — E66813 Obesity, class 3: Secondary | ICD-10-CM

## 2017-12-01 NOTE — Progress Notes (Signed)
Office: [phone]  /  Fax: [phone]   HPI:   Chief Complaint: OBESITY Gustavo is here to discuss her progress with her obesity treatment plan. She is on the Category 2 plan +200 specific calories and is following her eating plan approximately 90 % of the time. She states she is doing extra yard work for 15 to 20 minutes 1 to 2 times per week. Archana finds herself craving nuts and chocolate. She feels she is struggling with fluid retention. She plans to go away for next month. Her weight is 240 lb (108.9 kg) today and has maintained weight over a period of 2 weeks since her last visit. She has lost 9 lbs since starting treatment with [name].  Vitamin D deficiency Alya has a diagnosis of vitamin D deficiency. She is currently taking vit D. She admits fatigue and denies nausea, vomiting or muscle weakness.  Atrial Fibrillation Lachanda has a diagnosis of Afib and is on medication. Her hear rate is controlled today.  ALLERGIES: Allergies  Allergen Reactions  . Other Other (See Comments)    Metals Polyester - including hospital gowns and sheets - allergic contact dermatitis  . Sulfa Antibiotics Hives  . Sulfur Hives    MEDICATIONS: Current Outpatient Medications on File Prior to Visit  Medication Sig Dispense Refill  . acetaminophen (TYLENOL) 325 MG tablet Take 325 mg by mouth every 6 (six) hours as needed for moderate pain or headache.    [name] apixaban (ELIQUIS) 5 MG TABS tablet Take 1 tablet (5 mg total) by mouth 2 (two) times daily. 60 tablet 1  . diltiazem (CARDIZEM CD) 120 MG 24 hr capsule Take 1 capsule (120 mg total) by mouth 2 (two) times daily. 180 capsule 2  . furosemide (LASIX) 40 MG tablet Take 1 tablet (40 mg total) by mouth daily as needed for fluid. 45 tablet 1  . Magnesium 250 MG TABS Take 250 mg by mouth daily.    . Multiple Vitamin (MULTIVITAMIN WITH MINERALS) TABS tablet Take 1 tablet by mouth daily.     . NON FORMULARY Take 1 capsule by mouth daily. Doterra  Deep Blue Supplement    . NON FORMULARY Take 1 tablet by mouth daily. Cardiotrophin PMG Supplement    . NON FORMULARY Take 1 tablet by mouth at bedtime. Doterra Serenity Supplement    . NON FORMULARY Take 1 tablet by mouth daily. TriEase Supplement    . NON FORMULARY TerraGreens powder    . NON FORMULARY Copaiba oil    . NON FORMULARY Cardio For Life    . potassium chloride SA (K-DUR,KLOR-CON) 20 MEQ tablet Take 1 tablet (20 mEq total) by mouth daily. 90 tablet 2  . Vitamin D, Ergocalciferol, (DRISDOL) 50000 units CAPS capsule Take 1 capsule (50,000 Units total) by mouth every 7 (seven) days. 4 capsule 0   No current facility-administered medications on file prior to visit.     PAST MEDICAL HISTORY: Past Medical History:  Diagnosis Date  . Asthma   . Atrial fibrillation ([place])   . Back pain   . Diastolic dysfunction   . Fibromyalgia   . Gestational diabetes   . Knee pain   . Osteoarthritis   . Persistent atrial fibrillation ([place])   . Sleep apnea     PAST SURGICAL HISTORY: Past Surgical History:  Procedure Laterality Date  . ABDOMINAL HYSTERECTOMY    . APPENDECTOMY    . CARDIOVERSION N/A 07/04/2017   Procedure: CARDIOVERSION;  Surgeon: [name], MD;  Location:  [facility];  Service: Cardiovascular;  Laterality: N/A;  . CARDIOVERSION N/A 09/06/2017   Procedure: CARDIOVERSION;  Surgeon: [name], MD;  Location: [facility];  Service: Cardiovascular;  Laterality: N/A;  . CHOLECYSTECTOMY N/A 02/24/2015   Procedure: LAPAROSCOPIC CHOLECYSTECTOMY;  Surgeon: [name], MD;  Location: [facility];  Service: General;  Laterality: N/A;  . RIGHT/LEFT HEART CATH AND CORONARY ANGIOGRAPHY N/A 08/09/2017   Procedure: RIGHT/LEFT HEART CATH AND CORONARY ANGIOGRAPHY;  Surgeon: [name], MD;  Location: [facility];  Service: Cardiovascular;  Laterality: N/A;  . TEE WITHOUT CARDIOVERSION N/A 07/04/2017   Procedure: TRANSESOPHAGEAL ECHOCARDIOGRAM (TEE);  Surgeon: [name],  MD;  Location: [facility] ENDOSCOPY;  Service: Cardiovascular;  Laterality: N/A;    SOCIAL HISTORY: Social History   Tobacco Use  . Smoking status: Never Smoker  . Smokeless tobacco: Never Used  Substance Use Topics  . Alcohol use: Yes    Comment: little  . Drug use: No    FAMILY HISTORY: Family History  Problem Relation Age of Onset  . Heart disease Mother   . Hypertension Mother   . Hyperlipidemia Mother   . Diabetes Mother   . Sudden death Mother   . Thyroid disease Mother   . Obesity Mother   . Heart disease Father   . Heart attack Father   . Hypertension Father   . Hyperlipidemia Father   . Diabetes Father     ROS: Review of Systems  Constitutional: Positive for malaise/fatigue. Negative for weight loss.  Gastrointestinal: Negative for nausea and vomiting.  Musculoskeletal:       Negative for muscle weakness    PHYSICAL EXAM: Blood pressure 102/69, pulse 69, temperature (!) 97.5 F (36.4 C), height 5\' 5"  (1.651 m), weight 240 lb (108.9 kg), SpO2 96 %. Body mass index is 39.94 kg/m. Physical Exam  Constitutional: She is oriented to person, place, and time. She appears well-developed and well-nourished.  Cardiovascular: Normal rate.  Pulmonary/Chest: Effort normal.  Musculoskeletal: Normal range of motion.  Neurological: She is oriented to person, place, and time.  Skin: Skin is warm and dry.  Psychiatric: She has a normal mood and affect. Her behavior is normal.  Vitals reviewed.   RECENT LABS AND TESTS: BMET    Component Value Date/Time   NA 140 11/02/2017 1254   K 4.1 11/02/2017 1254   CL 103 11/02/2017 1254   CO2 21 11/02/2017 1254   GLUCOSE 91 11/02/2017 1254   GLUCOSE 97 08/31/2017 0930   BUN 13 11/02/2017 1254   CREATININE 0.87 11/02/2017 1254   CALCIUM 9.2 11/02/2017 1254   GFRNONAA 72 11/02/2017 1254   GFRAA 83 11/02/2017 1254   Lab Results  Component Value Date   HGBA1C 5.8 (H) 11/02/2017   HGBA1C 5.8 (H) 06/26/2017   Lab Results    Component Value Date   INSULIN 8.5 11/02/2017   CBC    Component Value Date/Time   WBC 7.4 11/02/2017 1254   WBC 7.4 08/31/2017 0930   RBC 4.49 11/02/2017 1254   RBC 4.63 08/31/2017 0930   HGB 14.1 11/02/2017 1254   HCT 41.6 11/02/2017 1254   PLT 207 08/31/2017 0930   PLT 247 06/24/2017 1046   MCV 93 11/02/2017 1254   MCH 31.4 11/02/2017 1254   MCH 30.2 08/31/2017 0930   MCHC 33.9 11/02/2017 1254   MCHC 33.2 08/31/2017 0930   RDW 14.7 11/02/2017 1254   LYMPHSABS 2.6 11/02/2017 1254   MONOABS 1.2 (H) 02/23/2015 2409  EOSABS 0.1 11/02/2017 1254   BASOSABS 0.0 11/02/2017 1254   Iron/TIBC/Ferritin/ %Sat No results found for: IRON, TIBC, FERRITIN, IRONPCTSAT Lipid Panel     Component Value Date/Time   CHOL 158 11/02/2017 1304   TRIG 151 (H) 11/02/2017 1304   HDL 30 (L) 11/02/2017 1304   CHOLHDL 5.4 06/26/2017 0517   VLDL 15 06/26/2017 0517   LDLCALC 98 11/02/2017 1304   Hepatic Function Panel     Component Value Date/Time   PROT 7.7 11/02/2017 1254   ALBUMIN 4.2 11/02/2017 1254   AST 22 11/02/2017 1254   ALT 18 11/02/2017 1254   ALKPHOS 75 11/02/2017 1254   BILITOT 0.8 11/02/2017 1254      Component Value Date/Time   TSH 3.010 11/02/2017 1254   TSH 4.410 06/24/2017 1046   TSH 1.260 02/22/2015 1954   Results for ANNDEE, CONNETT (MRN [id number]) as of 12/01/2017 12:11  Ref. Range 11/02/2017 12:54  Vitamin D, 25-Hydroxy Latest Ref Range: 30.0 - 100.0 ng/mL 30.9   ASSESSMENT AND PLAN: Paroxysmal atrial fibrillation (HCC)  Vitamin D deficiency  Class 3 severe obesity with serious comorbidity and body mass index (BMI) of 40.0 to 44.9 in adult, unspecified obesity type ([place])  PLAN:  Vitamin D Deficiency Aneliese was informed that low vitamin D levels contributes to fatigue and are associated with obesity, breast, and colon cancer. She agrees to continue to take prescription Vit D @50 ,000 IU every week  (no refill needed) and will follow up for routine testing  of vitamin D, at least 2-3 times per year. She was informed of the risk of over-replacement of vitamin D and agrees to not increase her dose unless she discusses this with [name] first.  Atrial Fibrillation Alvilda will continue her current medications as prescribed and follow up with our clinic in 2 weeks.  We spent > than 50% of the 15 minute visit on the counseling as documented in the note.  Obesity Avryl is currently in the action stage of change. As such, her goal is to continue with weight loss efforts She has agreed to follow the Category 2 plan +200 specific calories Meklit has been instructed to work up to a goal of 150 minutes of combined cardio and strengthening exercise per week for weight loss and overall health benefits. We discussed the following Behavioral Modification Strategies today: planning for success, better snacking choices, increasing lean protein intake, increasing vegetables and work on meal planning and easy cooking plans  Ryann has agreed to follow up with our clinic in 2 weeks. She was informed of the importance of frequent follow up visits to maximize her success with intensive lifestyle modifications for her multiple health conditions.   OBESITY BEHAVIORAL INTERVENTION VISIT  Today's visit was # 3 out of 22.  Starting weight: 249 lbs Starting date: 11/02/17 Today's weight : 240 lbs Today's date: 12/01/2017 Total lbs lost to date: 9 (Patients must lose 7 lbs in the first 6 months to continue with counseling)   ASK: We discussed the diagnosis of obesity with [name] today and Hava agreed to give [name] permission to discuss obesity behavioral modification therapy today.  ASSESS: Captola has the diagnosis of obesity and her BMI today is 39.94 Diego is in the action stage of change   ADVISE: Rosamond was educated on the multiple health risks of obesity as well as the benefit of weight loss to improve her health. She was advised of the need for  long term treatment and the  importance of lifestyle modifications.  AGREE: Multiple dietary modification options and treatment options were discussed and  Raaga agreed to the above obesity treatment plan.  I, Doreene Nest, am acting as transcriptionist for Eber Jones, MD  I have reviewed the above documentation for accuracy and completeness, and I agree with the above. - Ilene Qua, MD

## 2017-12-11 ENCOUNTER — Other Ambulatory Visit (INDEPENDENT_AMBULATORY_CARE_PROVIDER_SITE_OTHER): Payer: Self-pay | Admitting: Family Medicine

## 2017-12-11 DIAGNOSIS — E559 Vitamin D deficiency, unspecified: Secondary | ICD-10-CM

## 2018-01-12 ENCOUNTER — Ambulatory Visit (INDEPENDENT_AMBULATORY_CARE_PROVIDER_SITE_OTHER): Payer: Medicare Other | Admitting: Family Medicine

## 2018-01-17 ENCOUNTER — Encounter (HOSPITAL_COMMUNITY): Payer: Self-pay | Admitting: Nurse Practitioner

## 2018-01-17 ENCOUNTER — Ambulatory Visit (INDEPENDENT_AMBULATORY_CARE_PROVIDER_SITE_OTHER): Payer: Medicare Other | Admitting: Family Medicine

## 2018-01-17 ENCOUNTER — Ambulatory Visit (HOSPITAL_COMMUNITY)
Admission: RE | Admit: 2018-01-17 | Discharge: 2018-01-17 | Disposition: A | Discharge status: Home / Self Care | Payer: Medicare Other | Source: Ambulatory Visit | Attending: Nurse Practitioner | Admitting: Nurse Practitioner

## 2018-01-17 VITALS — BP 110/74 | HR 110 | Ht 65.0 in | Wt 239.0 lb

## 2018-01-17 VITALS — BP 90/60 | HR 81 | Temp 97.6°F | Ht 65.0 in | Wt 232.0 lb

## 2018-01-17 DIAGNOSIS — E669 Obesity, unspecified: Secondary | ICD-10-CM | POA: Diagnosis not present

## 2018-01-17 DIAGNOSIS — M1711 Unilateral primary osteoarthritis, right knee: Secondary | ICD-10-CM | POA: Diagnosis not present

## 2018-01-17 DIAGNOSIS — J45909 Unspecified asthma, uncomplicated: Secondary | ICD-10-CM | POA: Diagnosis not present

## 2018-01-17 DIAGNOSIS — M170 Bilateral primary osteoarthritis of knee: Secondary | ICD-10-CM | POA: Diagnosis not present

## 2018-01-17 DIAGNOSIS — M25562 Pain in left knee: Secondary | ICD-10-CM | POA: Diagnosis not present

## 2018-01-17 DIAGNOSIS — M25561 Pain in right knee: Secondary | ICD-10-CM | POA: Diagnosis not present

## 2018-01-17 DIAGNOSIS — I4892 Unspecified atrial flutter: Secondary | ICD-10-CM

## 2018-01-17 DIAGNOSIS — I509 Heart failure, unspecified: Secondary | ICD-10-CM | POA: Diagnosis not present

## 2018-01-17 DIAGNOSIS — G4730 Sleep apnea, unspecified: Secondary | ICD-10-CM | POA: Insufficient documentation

## 2018-01-17 DIAGNOSIS — Z6838 Body mass index (BMI) 38.0-38.9, adult: Secondary | ICD-10-CM

## 2018-01-17 DIAGNOSIS — Z7901 Long term (current) use of anticoagulants: Secondary | ICD-10-CM | POA: Diagnosis not present

## 2018-01-17 DIAGNOSIS — Z79899 Other long term (current) drug therapy: Secondary | ICD-10-CM | POA: Insufficient documentation

## 2018-01-17 DIAGNOSIS — M1990 Unspecified osteoarthritis, unspecified site: Secondary | ICD-10-CM | POA: Insufficient documentation

## 2018-01-17 DIAGNOSIS — K5909 Other constipation: Secondary | ICD-10-CM | POA: Diagnosis not present

## 2018-01-17 DIAGNOSIS — E559 Vitamin D deficiency, unspecified: Secondary | ICD-10-CM

## 2018-01-17 DIAGNOSIS — I481 Persistent atrial fibrillation: Secondary | ICD-10-CM | POA: Diagnosis not present

## 2018-01-17 DIAGNOSIS — M797 Fibromyalgia: Secondary | ICD-10-CM | POA: Insufficient documentation

## 2018-01-17 LAB — MAGNESIUM: Magnesium: 2.2 mg/dL (ref 1.7–2.4)

## 2018-01-17 LAB — BASIC METABOLIC PANEL
Anion gap: 10 (ref 5–15)
BUN: 27 mg/dL — AB (ref 8–23)
CALCIUM: 9.1 mg/dL (ref 8.9–10.3)
CO2: 22 mmol/L (ref 22–32)
Chloride: 106 mmol/L (ref 98–111)
Creatinine, Ser: 0.84 mg/dL (ref 0.44–1.00)
GFR calc Af Amer: >60 mL/min (ref >60)
GLUCOSE: 93 mg/dL (ref 70–99)
POTASSIUM: 4 mmol/L (ref 3.5–5.1)
Sodium: 138 mmol/L (ref 135–145)

## 2018-01-17 MED ORDER — VITAMIN D (ERGOCALCIFEROL) 1.25 MG (50000 UNIT) PO CAPS: 50000.0000 [IU] | ORAL_CAPSULE | ORAL | 0 refills | Status: DC

## 2018-01-17 MED ORDER — POLYETHYLENE GLYCOL 3350 17 GM/SCOOP PO POWD
17.0000 g | Freq: Every day | ORAL | 0 refills | Status: DC
Start: 2018-01-17 — End: 2018-09-14

## 2018-01-17 NOTE — Progress Notes (Signed)
Primary Care Physician: Patient, No Pcp Per Referring Physician: [facility] f/u Cardiologist: Dr. Linard Millers EP: Dr. [name] is a 62 y.o. female with a h/o prior AF with gall bladder surgery in 2016,  CHF, obesity, untreated sleep apnea, that was hospitalized 1/19 to 1/23  for afib with RVR.  She was admitted to cardiology with acute on chronic congestive heart failure, type unknown, and atrial fibrillation with RVR, likely secondary to heart failure exacerbation.   No ischemic workup in past. She was not on a home diuretic regimen. She was diuresed with 40 mg IV lasix BID.She was transitioned to [employer] today.  Weight is down 17 lbs from admission - discharge weight is 249 lbs.Orthostatics vitals negative for hypotension. She was euvolemic on exam. She discharged on 40 mg lasix daily and was instructed to weigh daily and keep a log.   She had TEE guided cardioversion 1/28 which was initially successful but then had ERAF on  f/u appointment with general cardiology 2/07. However, it is unclear how long she has had persistent afib after she developed in 2016 and was lost to f/u until January of this year. However, pt reports that she felt well until Christmas when she started retaining fluid and noted the shortness of breath.  She is in the afib clinic, 07/19/17 to discuss options to restore SR. We discussed flecainide and scheduled a Myoview which showed risk for CAD. She then went on to have a cath without any significant CAD.  F/u in afib clinic, 4/11, she ultimately was loaded on flecainide 100 mg bid and went on to have successful cardioversion but returns in afib today. Her cardizem was reduced to 120 mg bid from 300 mg daily for soft BP's last week.   Her heart rate is 122 bpm today but reports still with  soft BP's at home.   F/u in afib clinic 8/13. Pt is here from f/u with [name] for her persistent atrial flutter. She was offered ablation but she deferred. They discussed  Tikosyn but pt wanted to wait. She is now ready to commit in the next couple of weeks to get set up for hospitalization for Tikosyn. She is taking a lot of supplements and has been asked to stop these. She wants to take branded Tikosyn and can afford this, around 24$ a month.   Today, she denies symptoms of palpitations, chest pain, shortness of breath, orthopnea, PND, lower extremity edema, dizziness, presyncope, syncope, or neurologic sequela. The patient is tolerating medications without difficulties and is otherwise without complaint today.   Past Medical History:  Diagnosis Date  . Asthma   . Atrial fibrillation ([place])   . Back pain   . Diastolic dysfunction   . Fibromyalgia   . Gestational diabetes   . Knee pain   . Osteoarthritis   . Persistent atrial fibrillation ([place])   . Sleep apnea    Past Surgical History:  Procedure Laterality Date  . ABDOMINAL HYSTERECTOMY    . APPENDECTOMY    . CARDIOVERSION N/A 07/04/2017   Procedure: CARDIOVERSION;  Surgeon: [name], MD;  Location[facility] ENDOSCOPY;  Service: Cardiovascular;  Laterality: N/A;  . CARDIOVERSION N/A 09/06/2017   Procedure: CARDIOVERSION;  Surgeon: [name], MD;  Location: [facility] ENDOSCOPY;  Service: Cardiovascular;  Laterality: N/A;  . CHOLECYSTECTOMY N/A 02/24/2015   Procedure: LAPAROSCOPIC CHOLECYSTECTOMY;  Surgeon: [name], MD;  Location: [facility];  Service: General;  Laterality: N/A;  . RIGHT/LEFT HEART CATH  AND CORONARY ANGIOGRAPHY N/A 08/09/2017   Procedure: RIGHT/LEFT HEART CATH AND CORONARY ANGIOGRAPHY;  Surgeon: [name], MD;  Location: [place] CV LAB;  Service: Cardiovascular;  Laterality: N/A;  . TEE WITHOUT CARDIOVERSION N/A 07/04/2017   Procedure: TRANSESOPHAGEAL ECHOCARDIOGRAM (TEE);  Surgeon: [name], MD;  Location: [facility] ENDOSCOPY;  Service: Cardiovascular;  Laterality: N/A;    Current Outpatient Medications  Medication Sig Dispense Refill  . acetaminophen (TYLENOL) 325 MG tablet Take  325 mg by mouth every 6 (six) hours as needed for moderate pain or headache.    [name] apixaban (ELIQUIS) 5 MG TABS tablet Take 1 tablet (5 mg total) by mouth 2 (two) times daily. 60 tablet 1  . diltiazem (CARDIZEM CD) 120 MG 24 hr capsule Take 1 capsule (120 mg total) by mouth 2 (two) times daily. 180 capsule 2  . furosemide (LASIX) 40 MG tablet Take 1 tablet (40 mg total) by mouth daily as needed for fluid. (Patient taking differently: Take 40 mg by mouth daily. ) 45 tablet 1  . Magnesium 250 MG TABS Take 250 mg by mouth daily.    . Multiple Vitamin (MULTIVITAMIN WITH MINERALS) TABS tablet Take 1 tablet by mouth daily.     . NON FORMULARY Take 1 capsule by mouth daily. Doterra Deep Blue Supplement    . NON FORMULARY Take 1 tablet by mouth daily. Cardiotrophin PMG Supplement    . NON FORMULARY Take 1 tablet by mouth at bedtime. Doterra Serenity Supplement    . NON FORMULARY Take 1 tablet by mouth daily. TriEase Supplement    . NON FORMULARY TerraGreens powder    . NON FORMULARY Copaiba oil    . NON FORMULARY Cardio For Life    . polyethylene glycol powder (GLYCOLAX/MIRALAX) powder Take 17 g by mouth daily. 3350 g 0  . potassium chloride SA (K-DUR,KLOR-CON) 20 MEQ tablet Take 1 tablet (20 mEq total) by mouth daily. 90 tablet 2  . Vitamin D, Ergocalciferol, (DRISDOL) 50000 units CAPS capsule Take 1 capsule (50,000 Units total) by mouth every 7 (seven) days. 4 capsule 0   No current facility-administered medications for this encounter.     Allergies  Allergen Reactions  . Other Other (See Comments)    Metals Polyester - including hospital gowns and sheets - allergic contact dermatitis  . Sulfa Antibiotics Hives  . Sulfur Hives    Social History   Socioeconomic History  . Marital status: Married    Spouse name: Not on file  . Number of children: 4  . Years of education: Not on file  . Highest education level: Not on file  Occupational History  . Occupation: Disabled/retired  Social  Needs  . Financial resource strain: Not on file  . Food insecurity:    Worry: Not on file    Inability: Not on file  . Transportation needs:    Medical: Not on file    Non-medical: Not on file  Tobacco Use  . Smoking status: Never Smoker  . Smokeless tobacco: Never Used  Substance and Sexual Activity  . Alcohol use: Yes    Comment: little  . Drug use: No  . Sexual activity: Not Currently  Lifestyle  . Physical activity:    Days per week: Not on file    Minutes per session: Not on file  . Stress: Not on file  Relationships  . Social connections:    Talks on phone: Not on file    Gets together: Not on file  Attends religious service: Not on file    Active member of club or organization: Not on file    Attends meetings of clubs or organizations: Not on file    Relationship status: Not on file  . Intimate partner violence:    Fear of current or ex partner: Not on file    Emotionally abused: Not on file    Physically abused: Not on file    Forced sexual activity: Not on file  Other Topics Concern  . Not on file  Social History Narrative   L[place] in [place]   Not working    Family History  Problem Relation Age of Onset  . Heart disease Mother   . Hypertension Mother   . Hyperlipidemia Mother   . Diabetes Mother   . Sudden death Mother   . Thyroid disease Mother   . Obesity Mother   . Heart disease Father   . Heart attack Father   . Hypertension Father   . Hyperlipidemia Father   . Diabetes Father     ROS- All systems are reviewed and negative except as per the HPI above  Physical Exam: Vitals:   01/17/18 1500  BP: 110/74  Pulse: (!) 110  Weight: 108.4 kg  Height: 5\' 5"  (1.651 m)   Wt Readings from Last 3 Encounters:  01/17/18 108.4 kg  01/17/18 105.2 kg  12/01/17 108.9 kg    Labs: Lab Results  Component Value Date   NA 140 11/02/2017   K 4.1 11/02/2017   CL 103 11/02/2017   CO2 21 11/02/2017   GLUCOSE 91 11/02/2017   BUN 13 11/02/2017    CREATININE 0.87 11/02/2017   CALCIUM 9.2 11/02/2017   MG 2.1 08/31/2017   Lab Results  Component Value Date   INR 1.07 08/09/2017   Lab Results  Component Value Date   CHOL 158 11/02/2017   HDL 30 (L) 11/02/2017   LDLCALC 98 11/02/2017   TRIG 151 (H) 11/02/2017     GEN- The patient is well appearing, alert and oriented x 3 today.   Head- normocephalic, atraumatic Eyes-  Sclera clear, conjunctiva pink Ears- hearing intact Oropharynx- clear Neck- supple, no JVP Lymph- no cervical lymphadenopathy Lungs- Clear to ausculation bilaterally, normal work of breathing Heart- irregular rate and rhythm, no murmurs, rubs or gallops, PMI not laterally displaced GI- soft, NT, ND, + BS Extremities- no clubbing, cyanosis, or edema MS- no significant deformity or atrophy Skin- no rash or lesion Psych- euthymic mood, full affect Neuro- strength and sensation are intact  EKG- atrial flutter at 110 bpm, Qrs int 80 ms, qtc 481 ms Echo-Study Conclusions  - Left ventricle: The cavity size was normal. Wall thickness was   increased in a pattern of mild LVH. Systolic function was normal.   The estimated ejection fraction was in the range of 60% to 65%.   Wall motion was normal; there were no regional wall motion   abnormalities. The study is not technically sufficient to allow   evaluation of LV diastolic function. - Mitral valve: Calcified annulus. Mildly thickened leaflets .   There was mild regurgitation. Valve area by continuity equation   (using LVOT flow): 2.07 cm^2. - Left atrium: The atrium was mildly dilated. - Atrial septum: No defect or patent foramen ovale was identified. TEE-Study Conclusions  - Left ventricle: Systolic function was normal. The estimated   ejection fraction was in the range of 50% to 55%. No evidence of   thrombus. - Ventricular septum: The  contour showed a normal configuration.   There was no evidence of a ventricular septal defect. - Mitral valve: There  was mild regurgitation. - Left atrium: No formed thrombus. Dense spontaneous contrast No   evidence of thrombus in the atrial cavity or appendage. - Right ventricle: The cavity size was mildly dilated. - Atrial septum: There was a patent foramen ovale. - Impressions: Advanced 3D rendering of the atrial septum was   performed. [place] x 1 150 Joules   Converted from afib rate 110 to NSR rate 80&'s   No immediate neurologic sequela  Stress test- Summary Defect 1:  There is a medium defect of moderate severity. The defect is reversible.  Overall Study Impression Myocardial perfusion is abnormal. Findings consistent with ischemia. This is an intermediate risk study. Overall left ventricular systolic function was normal. LV cavity size is normal. Nuclear stress EF: 43%.       Cath report-Conclusion    Normal left main.  Normal left anterior descending coronary artery.  Normal circumflex coronary artery.  Normal dominant RCA.  Normal LV size with normal hemodynamics and estimated ejection fraction of 50%.  False positive myocardial perfusion imaging.  Mild pulmonary hypertension with mean pressure of 27 mmHg.  RECOMMENDATIONS:   Management of atrial fibrillation with aggressive rate control and stroke prophylaxis     Impressions:  - Advanced 3D rendering of the atrial septum was performed. [place] x 1   150 Joules   Converted from afib rate 110 to NSR rate 80&'s   No immediate neurologic sequela Successful cardioversion. No   cardiac source of emboli was indentified.   Assessment and Plan: 1. Persistent afib Flecainide 100 mg bid loaded and with successful cardioversion, but with ERAF Flecainide  stopped  Continue diltiazem 120 mg bid Wanting to come in for Tikosyn 9/3 Continue eliquis without missed doses for a chadsvasc score of 3  Reminded to stop supplements Bmet/mag today  2. Lifestyle issues Working on weight loss measures  She has untreated sleep apnea  2/2 not being able to tolerate cpap  F/u here for tikosyn admit  9/3   Geroge Baseman. Grenda Lora, [facility] [address] [place], [place] [address] [phone]

## 2018-01-17 NOTE — Progress Notes (Signed)
Office: [phone]  /  Fax: [phone]   HPI:   Chief Complaint: OBESITY Christie Atkinson is here to discuss her progress with her obesity treatment plan. She is on the Category 2 plan + 200 calories and is following her eating plan approximately 10 % of the time. She states she has been active with grandchildren. Christie Atkinson is deviating significantly from Category 2 plan, she is also doing intermittent fasting with a 6 hour window. She has increased simple carbohydrates and is decreasing lean protein. She is doing a lot of "reseach" online versus following our eating plans.  Her weight is 232 lb (105.2 kg) today and has had a weight loss of 8 pounds over a period of 6 to 7 weeks since her last visit. She has lost 17 lbs since starting treatment with [name].  Constipation Christie Atkinson notes constipation and notes decrease in BM frequency and some abdominal cramping. She has a history of irritable bowel syndrome. She denies hematochezia or melena. She admits to drinking less H20 recently.  Vitamin D Deficiency Christie Atkinson has a diagnosis of vitamin D deficiency. She is stable on prescription Vit D, not yet at goal. She denies nausea, vomiting or muscle weakness.  ALLERGIES: Allergies  Allergen Reactions  . Other Other (See Comments)    Metals Polyester - including hospital gowns and sheets - allergic contact dermatitis  . Sulfa Antibiotics Hives  . Sulfur Hives    MEDICATIONS: Current Outpatient Medications on File Prior to Visit  Medication Sig Dispense Refill  . acetaminophen (TYLENOL) 325 MG tablet Take 325 mg by mouth every 6 (six) hours as needed for moderate pain or headache.    [name] apixaban (ELIQUIS) 5 MG TABS tablet Take 1 tablet (5 mg total) by mouth 2 (two) times daily. 60 tablet 1  . diltiazem (CARDIZEM CD) 120 MG 24 hr capsule Take 1 capsule (120 mg total) by mouth 2 (two) times daily. 180 capsule 2  . furosemide (LASIX) 40 MG tablet Take 1 tablet (40 mg total) by mouth daily as needed for  fluid. 45 tablet 1  . Magnesium 250 MG TABS Take 250 mg by mouth daily.    . Multiple Vitamin (MULTIVITAMIN WITH MINERALS) TABS tablet Take 1 tablet by mouth daily.     . NON FORMULARY Take 1 capsule by mouth daily. Doterra Deep Blue Supplement    . NON FORMULARY Take 1 tablet by mouth daily. Cardiotrophin PMG Supplement    . NON FORMULARY Take 1 tablet by mouth at bedtime. Doterra Serenity Supplement    . NON FORMULARY Take 1 tablet by mouth daily. TriEase Supplement    . NON FORMULARY TerraGreens powder    . NON FORMULARY Copaiba oil    . NON FORMULARY Cardio For Life    . potassium chloride SA (K-DUR,KLOR-CON) 20 MEQ tablet Take 1 tablet (20 mEq total) by mouth daily. 90 tablet 2   No current facility-administered medications on file prior to visit.     PAST MEDICAL HISTORY: Past Medical History:  Diagnosis Date  . Asthma   . Atrial fibrillation ([place])   . Back pain   . Diastolic dysfunction   . Fibromyalgia   . Gestational diabetes   . Knee pain   . Osteoarthritis   . Persistent atrial fibrillation ([place])   . Sleep apnea     PAST SURGICAL HISTORY: Past Surgical History:  Procedure Laterality Date  . ABDOMINAL HYSTERECTOMY    . APPENDECTOMY    . CARDIOVERSION N/A 07/04/2017   Procedure:  CARDIOVERSION;  Surgeon: [name], MD;  Location: [facility] ENDOSCOPY;  Service: Cardiovascular;  Laterality: N/A;  . CARDIOVERSION N/A 09/06/2017   Procedure: CARDIOVERSION;  Surgeon: [name], MD;  Location: [facility] ENDOSCOPY;  Service: Cardiovascular;  Laterality: N/A;  . CHOLECYSTECTOMY N/A 02/24/2015   Procedure: LAPAROSCOPIC CHOLECYSTECTOMY;  Surgeon: [name], MD;  Location: [place];  Service: General;  Laterality: N/A;  . RIGHT/LEFT HEART CATH AND CORONARY ANGIOGRAPHY N/A 08/09/2017   Procedure: RIGHT/LEFT HEART CATH AND CORONARY ANGIOGRAPHY;  Surgeon: [name], MD;  Location: [facility] CV LAB;  Service: Cardiovascular;  Laterality: N/A;  . TEE WITHOUT CARDIOVERSION N/A  07/04/2017   Procedure: TRANSESOPHAGEAL ECHOCARDIOGRAM (TEE);  Surgeon: [name], MD;  Location: [facility] ENDOSCOPY;  Service: Cardiovascular;  Laterality: N/A;    SOCIAL HISTORY: Social History   Tobacco Use  . Smoking status: Never Smoker  . Smokeless tobacco: Never Used  Substance Use Topics  . Alcohol use: Yes    Comment: little  . Drug use: No    FAMILY HISTORY: Family History  Problem Relation Age of Onset  . Heart disease Mother   . Hypertension Mother   . Hyperlipidemia Mother   . Diabetes Mother   . Sudden death Mother   . Thyroid disease Mother   . Obesity Mother   . Heart disease Father   . Heart attack Father   . Hypertension Father   . Hyperlipidemia Father   . Diabetes Father     ROS: Review of Systems  Constitutional: Positive for weight loss.  Gastrointestinal: Positive for constipation. Negative for melena, nausea and vomiting.       + Abdominal cramping Negative hematochezia  Musculoskeletal:       Negative muscle weakness    PHYSICAL EXAM: Blood pressure 90/60, pulse 81, temperature 97.6 F (36.4 C), temperature source Oral, height 5\' 5"  (1.651 m), weight 232 lb (105.2 kg), SpO2 96 %. Body mass index is 38.61 kg/m. Physical Exam  Constitutional: She is oriented to person, place, and time. She appears well-developed and well-nourished.  Cardiovascular: Normal rate.  Pulmonary/Chest: Effort normal.  Musculoskeletal: Normal range of motion.  Neurological: She is oriented to person, place, and time.  Skin: Skin is warm and dry.  Psychiatric: She has a normal mood and affect. Her behavior is normal.  Vitals reviewed.   RECENT LABS AND TESTS: BMET    Component Value Date/Time   NA 140 11/02/2017 1254   K 4.1 11/02/2017 1254   CL 103 11/02/2017 1254   CO2 21 11/02/2017 1254   GLUCOSE 91 11/02/2017 1254   GLUCOSE 97 08/31/2017 0930   BUN 13 11/02/2017 1254   CREATININE 0.87 11/02/2017 1254   CALCIUM 9.2 11/02/2017 1254   GFRNONAA 72  11/02/2017 1254   GFRAA 83 11/02/2017 1254   Lab Results  Component Value Date   HGBA1C 5.8 (H) 11/02/2017   HGBA1C 5.8 (H) 06/26/2017   Lab Results  Component Value Date   INSULIN 8.5 11/02/2017   CBC    Component Value Date/Time   WBC 7.4 11/02/2017 1254   WBC 7.4 08/31/2017 0930   RBC 4.49 11/02/2017 1254   RBC 4.63 08/31/2017 0930   HGB 14.1 11/02/2017 1254   HCT 41.6 11/02/2017 1254   PLT 207 08/31/2017 0930   PLT 247 06/24/2017 1046   MCV 93 11/02/2017 1254   MCH 31.4 11/02/2017 1254   MCH 30.2 08/31/2017 0930   MCHC 33.9 11/02/2017 1254   MCHC 33.2 08/31/2017  0930   RDW 14.7 11/02/2017 1254   LYMPHSABS 2.6 11/02/2017 1254   MONOABS 1.2 (H) 02/23/2015 0619   EOSABS 0.1 11/02/2017 1254   BASOSABS 0.0 11/02/2017 1254   Iron/TIBC/Ferritin/ %Sat No results found for: IRON, TIBC, FERRITIN, IRONPCTSAT Lipid Panel     Component Value Date/Time   CHOL 158 11/02/2017 1304   TRIG 151 (H) 11/02/2017 1304   HDL 30 (L) 11/02/2017 1304   CHOLHDL 5.4 06/26/2017 0517   VLDL 15 06/26/2017 0517   LDLCALC 98 11/02/2017 1304   Hepatic Function Panel     Component Value Date/Time   PROT 7.7 11/02/2017 1254   ALBUMIN 4.2 11/02/2017 1254   AST 22 11/02/2017 1254   ALT 18 11/02/2017 1254   ALKPHOS 75 11/02/2017 1254   BILITOT 0.8 11/02/2017 1254      Component Value Date/Time   TSH 3.010 11/02/2017 1254   TSH 4.410 06/24/2017 1046   TSH 1.260 02/22/2015 1954  Results for JULIANNAH, OHMANN (MRN [id number]) as of 01/17/2018 13:22  Ref. Range 11/02/2017 12:54  Vitamin D, 25-Hydroxy Latest Ref Range: 30.0 - 100.0 ng/mL 30.9    ASSESSMENT AND PLAN: Other constipation - Plan: polyethylene glycol powder (GLYCOLAX/MIRALAX) powder  Vitamin D deficiency - Plan: Vitamin D, Ergocalciferol, (DRISDOL) 50000 units CAPS capsule  Class 2 severe obesity with serious comorbidity and body mass index (BMI) of 38.0 to 38.9 in adult, unspecified obesity type  (HCC)  PLAN:  Constipation Christie Atkinson was informed decrease bowel movement frequency is normal while losing weight, but stools should not be hard or painful. High fiber foods were discussed today. Christie Atkinson agrees to start OTC miralax as needed and she was advised to increase her H20 intake and work on increasing her fiber intake. Christie Atkinson agrees to follow up with our clinic in 2 to 3 weeks with Dr. Adair Patter.  Vitamin D Deficiency Christie Atkinson was informed that low vitamin D levels contributes to fatigue and are associated with obesity, breast, and colon cancer. Christie Atkinson agrees to continue taking prescription Vit D @50 ,000 IU every week #4 and we will refill for 1 month. She will follow up for routine testing of vitamin D, at least 2-3 times per year. She was informed of the risk of over-replacement of vitamin D and agrees to not increase her dose unless she discusses this with [name] first. Christie Atkinson agrees to follow up with our clinic in 2 to 3 weeks with Dr. Adair Patter.  Obesity Christie Atkinson is currently in the action stage of change. As such, her goal is to continue with weight loss efforts She has agreed to portion control better and make smarter food choices, such as increase vegetables, and decrease simple carbohydrates  Christie Atkinson has been instructed to work up to a goal of 150 minutes of combined cardio and strengthening exercise per week for weight loss and overall health benefits. We discussed the following Behavioral Modification Strategies today: increasing lean protein intake, decreasing simple carbohydrates, increase H20 intake, and increasing fiber rich foods   Christie Atkinson has agreed to follow up with our clinic in 2 to 3 weeks with Dr. Adair Patter. She was informed of the importance of frequent follow up visits to maximize her success with intensive lifestyle modifications for her multiple health conditions.   OBESITY BEHAVIORAL INTERVENTION VISIT  Today's visit was # 4 out of 22.  Starting weight: 249  lbs Starting date: 11/02/17 Today's weight : 232 lbs  Today's date: 01/17/2018 Total lbs lost to date: 17    ASK: We discussed  the diagnosis of obesity with Christie Atkinson today and Christie Atkinson agreed to give [name] permission to discuss obesity behavioral modification therapy today.  ASSESS: Christie Atkinson has the diagnosis of obesity and her BMI today is 38.61 Christie Atkinson is in the action stage of change   ADVISE: Christie Atkinson was educated on the multiple health risks of obesity as well as the benefit of weight loss to improve her health. She was advised of the need for long term treatment and the importance of lifestyle modifications.  AGREE: Multiple dietary modification options and treatment options were discussed and  Christie Atkinson agreed to the above obesity treatment plan.  I, Trixie Dredge, am acting as transcriptionist for Dennard Nip, MD  I have reviewed the above documentation for accuracy and completeness, and I agree with the above. -Dennard Nip, MD

## 2018-01-18 ENCOUNTER — Telehealth: Payer: Self-pay | Admitting: Pharmacist

## 2018-01-18 NOTE — Telephone Encounter (Addendum)
Pt called clinic to ask about all of her herbal supplements. I have never seen pt in clinic before but have counseled her before on supplements and previously advised her of supplements to discontinue prior to Tikosyn start. Spent over 30 minutes on the phone with pt as she asked me about over 20 herbals and oils that she is currently using.  Discussed that none of her herbals or DoTerra supplements have any scientific data to support their use. She stated she did not like to be talked down to because she prefers [place] medicine. Advised her of the harm of approximately half of her herbals and after each case she stated "that's helpful, I didn't know that." Again advised pt that this is a risk of any of her herbals and essential oils that she takes because drug interactions and side effects are not commonly listed on any of these products. She prefers to continue taking the ones that she safely can.  She is aware to stop black pepper oil, frankincense, CBD oil, Relora, Deep Blue (contains frankincense), and Serenity oil (contains marjoram oil) due to either side effects or drug interactions with her prescription medications.

## 2018-01-19 ENCOUNTER — Telehealth: Payer: Self-pay | Admitting: *Deleted

## 2018-01-19 NOTE — Telephone Encounter (Signed)
Patient called in upset because she has not gotten her oral appliance. After researching her chart I told the patient the letter of medical necessity was signed and sent to Dr Augustina Mood per her request the day of her appointment with Dr. Claiborne Billings. She states she is aware of this and she has appointment with Dr Toy Cookey scheduled however Dr Toy Cookey is out of network with her insurance. I informed her this is the first phone call that we have received from her saying she has not gotten her oral appliance. We were under the assumption she already had her device. Patient states that she cannot find a dentist that is in network. She asked that we call dentists that she has a listing of to see if they are in network. I explained to her that we do not have the resources nor the time to do this. This is something that she would need to do. I told her that Dr Claiborne Billings routinely uses Dr Oneal Grout. She states that he is on her list. I then told her that I will call his office to see if he is in network. I placed the patient on hold and called Dr Kae Heller office and s/w kim. She states they are in network however she can't tell me how much if any out of pocket costs the patient will have until it's filed. Patient was made aware of this and agreed to see Dr Ron Parker. His office contact information was provided to the patient. Referral for oral appliance faxed over to Dr Ron Parker office. I spent 27 minutes on the phone with this patient.

## 2018-01-19 NOTE — Telephone Encounter (Signed)
Patient called today with questions about herbal medications before she starts Tikosyn. She reports she takes colloidal silver as need for colds and ear infections. This medication is likely unsafe and the patient was counseled to discontinue use. Patient states she will use sparingly, added to med list for awareness. Patient also states she takes oral hyaluronic acid and hyaluronic gel injections into her knee. This medication is likely safe. Also added to med list.   Isaias Sakai, Sherian Rein D PGY1 Pharmacy Resident  Phone [phone] 01/19/2018      10:57 AM

## 2018-01-24 DIAGNOSIS — M1711 Unilateral primary osteoarthritis, right knee: Secondary | ICD-10-CM | POA: Diagnosis not present

## 2018-01-24 DIAGNOSIS — M25561 Pain in right knee: Secondary | ICD-10-CM | POA: Diagnosis not present

## 2018-02-07 ENCOUNTER — Other Ambulatory Visit: Payer: Self-pay

## 2018-02-07 ENCOUNTER — Ambulatory Visit (HOSPITAL_COMMUNITY)
Admission: RE | Admit: 2018-02-07 | Discharge: 2018-02-07 | Disposition: A | Discharge status: Home / Self Care | Payer: Medicare Other | Source: Ambulatory Visit | Attending: Nurse Practitioner | Admitting: Nurse Practitioner

## 2018-02-07 ENCOUNTER — Encounter (HOSPITAL_COMMUNITY): Payer: Self-pay | Admitting: General Practice

## 2018-02-07 ENCOUNTER — Ambulatory Visit (INDEPENDENT_AMBULATORY_CARE_PROVIDER_SITE_OTHER): Payer: Self-pay | Admitting: Family Medicine

## 2018-02-07 ENCOUNTER — Encounter (HOSPITAL_COMMUNITY): Payer: Self-pay | Admitting: Nurse Practitioner

## 2018-02-07 ENCOUNTER — Ambulatory Visit (INDEPENDENT_AMBULATORY_CARE_PROVIDER_SITE_OTHER): Payer: Medicare Other | Admitting: Family Medicine

## 2018-02-07 ENCOUNTER — Inpatient Hospital Stay (HOSPITAL_COMMUNITY)
Admission: AD | Admit: 2018-02-07 | Discharge: 2018-02-11 | DRG: 309 | Disposition: A | Discharge status: Home / Self Care | Payer: Medicare Other | Source: Ambulatory Visit | Attending: Internal Medicine | Admitting: Internal Medicine

## 2018-02-07 VITALS — BP 96/65 | HR 90 | Temp 97.9°F | Ht 65.0 in | Wt 237.0 lb

## 2018-02-07 VITALS — BP 112/76 | HR 91 | Ht 65.0 in | Wt 240.6 lb

## 2018-02-07 DIAGNOSIS — I482 Chronic atrial fibrillation, unspecified: Secondary | ICD-10-CM

## 2018-02-07 DIAGNOSIS — E559 Vitamin D deficiency, unspecified: Secondary | ICD-10-CM | POA: Diagnosis not present

## 2018-02-07 DIAGNOSIS — Z9049 Acquired absence of other specified parts of digestive tract: Secondary | ICD-10-CM

## 2018-02-07 DIAGNOSIS — Z5181 Encounter for therapeutic drug level monitoring: Secondary | ICD-10-CM | POA: Diagnosis not present

## 2018-02-07 DIAGNOSIS — Z8349 Family history of other endocrine, nutritional and metabolic diseases: Secondary | ICD-10-CM | POA: Diagnosis not present

## 2018-02-07 DIAGNOSIS — I272 Pulmonary hypertension, unspecified: Secondary | ICD-10-CM | POA: Diagnosis present

## 2018-02-07 DIAGNOSIS — M797 Fibromyalgia: Secondary | ICD-10-CM | POA: Diagnosis present

## 2018-02-07 DIAGNOSIS — I481 Persistent atrial fibrillation: Principal | ICD-10-CM

## 2018-02-07 DIAGNOSIS — M1990 Unspecified osteoarthritis, unspecified site: Secondary | ICD-10-CM | POA: Diagnosis present

## 2018-02-07 DIAGNOSIS — Z79899 Other long term (current) drug therapy: Secondary | ICD-10-CM | POA: Diagnosis not present

## 2018-02-07 DIAGNOSIS — Z833 Family history of diabetes mellitus: Secondary | ICD-10-CM

## 2018-02-07 DIAGNOSIS — Z888 Allergy status to other drugs, medicaments and biological substances status: Secondary | ICD-10-CM | POA: Diagnosis not present

## 2018-02-07 DIAGNOSIS — Z7901 Long term (current) use of anticoagulants: Secondary | ICD-10-CM

## 2018-02-07 DIAGNOSIS — Q211 Atrial septal defect: Secondary | ICD-10-CM

## 2018-02-07 DIAGNOSIS — E669 Obesity, unspecified: Secondary | ICD-10-CM | POA: Diagnosis present

## 2018-02-07 DIAGNOSIS — Z90710 Acquired absence of both cervix and uterus: Secondary | ICD-10-CM | POA: Diagnosis not present

## 2018-02-07 DIAGNOSIS — Z6839 Body mass index (BMI) 39.0-39.9, adult: Secondary | ICD-10-CM | POA: Diagnosis not present

## 2018-02-07 DIAGNOSIS — I5032 Chronic diastolic (congestive) heart failure: Secondary | ICD-10-CM | POA: Diagnosis present

## 2018-02-07 DIAGNOSIS — I4819 Other persistent atrial fibrillation: Secondary | ICD-10-CM | POA: Diagnosis present

## 2018-02-07 DIAGNOSIS — I480 Paroxysmal atrial fibrillation: Secondary | ICD-10-CM | POA: Diagnosis present

## 2018-02-07 DIAGNOSIS — Z882 Allergy status to sulfonamides status: Secondary | ICD-10-CM | POA: Diagnosis not present

## 2018-02-07 DIAGNOSIS — Z8249 Family history of ischemic heart disease and other diseases of the circulatory system: Secondary | ICD-10-CM | POA: Diagnosis not present

## 2018-02-07 HISTORY — DX: Encounter for therapeutic drug level monitoring: Z51.81

## 2018-02-07 HISTORY — DX: Other long term (current) drug therapy: Z79.899

## 2018-02-07 LAB — BASIC METABOLIC PANEL
Anion gap: 9 (ref 5–15)
BUN: 17 mg/dL (ref 8–23)
CALCIUM: 9.2 mg/dL (ref 8.9–10.3)
CO2: 22 mmol/L (ref 22–32)
CREATININE: 0.91 mg/dL (ref 0.44–1.00)
Chloride: 109 mmol/L (ref 98–111)
GFR calc non Af Amer: >60 mL/min (ref >60)
Glucose, Bld: 123 mg/dL — ABNORMAL HIGH (ref 70–99)
Potassium: 4.9 mmol/L (ref 3.5–5.1)
SODIUM: 140 mmol/L (ref 135–145)

## 2018-02-07 LAB — MAGNESIUM: Magnesium: 2.3 mg/dL (ref 1.7–2.4)

## 2018-02-07 MED ORDER — MAGNESIUM OXIDE 400 (241.3 MG) MG PO TABS
400.0000 mg | ORAL_TABLET | Freq: Every day | ORAL | Status: DC
  Administered 2018-02-07 – 2018-02-11 (×5): 400 mg via ORAL
  Filled 2018-02-07 (×5): qty 1

## 2018-02-07 MED ORDER — POTASSIUM CHLORIDE CRYS ER 20 MEQ PO TBCR
20.0000 meq | EXTENDED_RELEASE_TABLET | Freq: Every day | ORAL | Status: DC
  Administered 2018-02-07 – 2018-02-11 (×5): 20 meq via ORAL
  Filled 2018-02-07 (×5): qty 1

## 2018-02-07 MED ORDER — SODIUM CHLORIDE 0.9% FLUSH
3.0000 mL | Freq: Two times a day (BID) | INTRAVENOUS | Status: DC
  Administered 2018-02-07 – 2018-02-10 (×4): 3 mL via INTRAVENOUS

## 2018-02-07 MED ORDER — DOFETILIDE 500 MCG PO CAPS: 500.0000 ug | ORAL_CAPSULE | Freq: Two times a day (BID) | ORAL | Status: DC

## 2018-02-07 MED ORDER — ADULT MULTIVITAMIN W/MINERALS CH
1.0000 | ORAL_TABLET | Freq: Every day | ORAL | Status: DC
  Administered 2018-02-07 – 2018-02-11 (×5): 1 via ORAL
  Filled 2018-02-07 (×5): qty 1

## 2018-02-07 MED ORDER — FUROSEMIDE 40 MG PO TABS
40.0000 mg | ORAL_TABLET | Freq: Every day | ORAL | Status: DC
  Administered 2018-02-08 – 2018-02-11 (×4): 40 mg via ORAL
  Filled 2018-02-07 (×5): qty 1

## 2018-02-07 MED ORDER — APIXABAN 5 MG PO TABS
5.0000 mg | ORAL_TABLET | Freq: Two times a day (BID) | ORAL | Status: DC
  Administered 2018-02-07 – 2018-02-11 (×8): 5 mg via ORAL
  Filled 2018-02-07 (×8): qty 1

## 2018-02-07 MED ORDER — DILTIAZEM HCL ER COATED BEADS 120 MG PO CP24
120.0000 mg | ORAL_CAPSULE | Freq: Two times a day (BID) | ORAL | Status: DC
  Administered 2018-02-07 – 2018-02-11 (×8): 120 mg via ORAL
  Filled 2018-02-07 (×8): qty 1

## 2018-02-07 MED ORDER — DOFETILIDE 500 MCG PO CAPS
500.0000 ug | ORAL_CAPSULE | Freq: Two times a day (BID) | ORAL | Status: DC
  Administered 2018-02-07 – 2018-02-08 (×3): 500 ug via ORAL
  Filled 2018-02-07 (×3): qty 1

## 2018-02-07 MED ORDER — ACETAMINOPHEN 325 MG PO TABS
325.0000 mg | ORAL_TABLET | Freq: Four times a day (QID) | ORAL | Status: DC | PRN
  Administered 2018-02-08 – 2018-02-10 (×3): 325 mg via ORAL
  Filled 2018-02-07 (×3): qty 1

## 2018-02-07 MED ORDER — SODIUM CHLORIDE 0.9% FLUSH: 3.0000 mL | INTRAVENOUS | Status: DC | PRN

## 2018-02-07 MED ORDER — SODIUM CHLORIDE 0.9 % IV SOLN: 250.0000 mL | INTRAVENOUS | Status: DC | PRN

## 2018-02-07 NOTE — Care Management (Signed)
02-07-18  BENEFITS CHECK:  # 4. S/W  ADLIN @ [employer] # [phone]   1. TIKOSYN   125 MCG, 250 MCG AND 500 MCG BID COVER- NOT COVER PRIOR APPROVAL- NO  2. DOFETILIDE   125 MCG , 250 MCG AND 500 MCG BID COVER- YES                          CO-PAY- $ 11.00   for each one                 TIER- NO PRIOR APPROVAL- NO  PREFERRED PHARMACY : YES   [facility] M/O 90 DAY SUPPLY FOR M/O $ 7.00

## 2018-02-07 NOTE — Progress Notes (Signed)
MEDICATION RELATED CONSULT NOTE - INITIAL   Pharmacy Consult:    Review patient's PTA supplements to ensure safe with her current medicines as well as planned TIKOSYN  Medications:  Medications Prior to Admission  Medication Sig Dispense Refill Last Dose  . acetaminophen (TYLENOL) 325 MG tablet Take 325 mg by mouth every 6 (six) hours as needed for moderate pain or headache.   Past Month at Unknown time  . apixaban (ELIQUIS) 5 MG TABS tablet Take 1 tablet (5 mg total) by mouth 2 (two) times daily. 60 tablet 1 02/07/2018 at 1030  . diltiazem (CARDIZEM CD) 120 MG 24 hr capsule Take 1 capsule (120 mg total) by mouth 2 (two) times daily. 180 capsule 2 02/07/2018 at 1030  . furosemide (LASIX) 40 MG tablet Take 1 tablet (40 mg total) by mouth daily as needed for fluid. (Patient taking differently: Take 40 mg by mouth See admin instructions. Take 40mg  by mouth daily, may take additional 20mg  if needed for fluid retention pain) 45 tablet 1 02/07/2018 at 1030  . Hyaluronic Acid 20 MG/ML GEL Inject 20 mg as directed every 6 (six) months.    02/01/2018 at Unknown time  . Magnesium 250 MG TABS Take 250 mg by mouth daily.   02/07/2018 at 0500  . Multiple Vitamin (MULTIVITAMIN WITH MINERALS) TABS tablet Take 1 tablet by mouth daily.    Past Week at Unknown time  . NON FORMULARY Take 1 tablet by mouth every three (3) days as needed (for seasonal allergies/congestion). TriEase Supplement    Past Month at Unknown time  . NON FORMULARY Take 1 Scoop by mouth daily as needed (for hydration or after a fast). Cardio For Life . Mix in 8 oz of fluid   Past Month at Unknown time  . NON FORMULARY Take 1 drop by mouth daily as needed (cold; only if sick with a cold/congestion/sore throat). Colloidal silver - topically and orally for colds and ear infections    Past Month at Unknown time  . NON FORMULARY Take 1 Scoop by mouth. Collagen. Mix with food once daily.    02/06/2018 at Unknown time  . NON FORMULARY Take by mouth. TerraGreen  powder 1 scoop mix in water once daily prn when she has not eaten properly.    Past Week at Unknown time  . NON FORMULARY Take 1 drop by mouth. Lemon oil . Mixed in food or drink once weekly.   Past Week at Unknown time  . NON FORMULARY Take 1 drop by mouth daily as needed. Grape seed extract ( GSE) Mixed over a salad daily as needed for joint pain relief.   Past Week at Unknown time  . potassium chloride SA (K-DUR,KLOR-CON) 20 MEQ tablet Take 1 tablet (20 mEq total) by mouth daily. 90 tablet 2 02/07/2018 at 0500  . Hyaluronic Acid 20-60 MG CAPS Take 20 mg by mouth daily.   Not Taking  . polyethylene glycol powder (GLYCOLAX/MIRALAX) powder Take 17 g by mouth daily. 3350 g 0 Taking  . Vitamin D, Ergocalciferol, (DRISDOL) 50000 units CAPS capsule Take 1 capsule (50,000 Units total) by mouth every 7 (seven) days. 4 capsule 0 Taking    Assessment/Plan:  Cardiology PA , R Ursuy noted 9/3 on admit: Patient is taking a lot of supplements and was asked to stop these.  The patient has been previously counseled prior to this admission by pharmacists Megan Supple 01/18/18 and Isaias Sakai 01/19/18. See the [facility]  office notes for details.  The patient  is aware to stop black pepper oil, frankincense, CBD oil, Relora, Deep Blue (contains frankincense), and Serenity oil (contains marjoram oil) due to either side effects or drug interactions with her prescription medications. On 01/18/17 the patient was advised the DoTerra oils/supplements advised pt that there is a risk of any of her herbals and essential oils that she takes because drug interactions and side effects are not commonly listed on any of these products.  Also Roderic Palau, NP in [facility] clinic noted "Reminded to stop all supplements that may interfere with Tikosyn.  I spoke to the patient today 02/07/18 and she has confirmed that she is not taking the black pepper oil, frankincense, CBD oil, Relora, Deep Blue (contains frankincense), and Serenity oil  (contains marjoram oil) as instructed previously by [employer], Pharm D.  I have updated the pta medication list including the supplements/oils that she is currently taking. These are okay to continue taking with the awareness as previously discussed with the patient and as noted above that there is a risk of any of her herbals and essential oils that she takes because drug interactions and side effects are not commonly listed on any of these products.   Nicole Cella, RPh Clinical Pharmacist Please check AMION for all [place] phone numbers After 10:00 PM, call [place] [phone] 02/07/2018,5:39 PM

## 2018-02-07 NOTE — H&P (Addendum)
Cardiology Admission History and Physical:   Patient ID: Christie Atkinson; MRN: [id number]; DOB: [birth year]   Admission date: 02/07/2018  Primary Care Provider: Patient, No Pcp Per Primary Cardiologist: Sinclair Grooms, MD  Primary Electrophysiologist:  [name]  Chief Complaint:  Tikosyn load  Patient Profile:   Christie Atkinson is a 62 y.o. female with a history of prior AF with gall bladder surgery in 2016,  CHF, obesity, untreated sleep apnea, that was hospitalized 1/19 to 1/23  for afib with RVR  History of Present Illness:   Ms. Grima historically had TEE guided cardioversion 1/28 which was initially successful but then had ERAF on  f/u appointment with general cardiology 2/07. However, it is unclear how long she has had persistent afib after she developed in 2016 and was lost to f/u until January of this year. However, pt reports that she felt well until Christmas when she started retaining fluid and noted the shortness of breath.  She was seen in the afib clinic, 07/19/17 to discuss options to restore SR. Discussed flecainide and scheduled a Myoview which showed risk for CAD. She then went on to have a cath without any significant CAD.  F/u in afib clinic, 4/11, she was ultimately was loaded on flecainide 100 mg bid and went on to have successful cardioversion but returned in afib this day. Her cardizem was reduced to 120 mg bid from 300 mg daily for soft BP's last week.   Her heart rate is 122 bpmwith reports of soft BP's at home.   F/u in afib clinic 8/13. Pt had f/u with [name] for her persistent atrial flutter. She was offered ablation but she deferred. They discussed Tikosyn but pt wanted to wait. She iwas ready to commit in the next couple of weeks to get set up for hospitalization for Tikosyn. She is taking a lot of supplements and was asked to stop these. She wants to take branded Tikosyn and can afford this, around 24$ a month.  F/u in afib clinic, 02/07/18. for  Tikosyn admit. She reported no benadryl use. No missed doses of Eliquis   Past Medical History:  Diagnosis Date  . Asthma   . Atrial fibrillation ([place])   . Back pain   . Diastolic dysfunction   . Fibromyalgia   . Gestational diabetes   . Knee pain   . Osteoarthritis   . Persistent atrial fibrillation ([place])   . Sleep apnea     Past Surgical History:  Procedure Laterality Date  . ABDOMINAL HYSTERECTOMY    . APPENDECTOMY    . CARDIOVERSION N/A 07/04/2017   Procedure: CARDIOVERSION;  Surgeon: [name], MD;  Location: [facility] ENDOSCOPY;  Service: Cardiovascular;  Laterality: N/A;  . CARDIOVERSION N/A 09/06/2017   Procedure: CARDIOVERSION;  Surgeon: [name], MD;  Location: [facility] ENDOSCOPY;  Service: Cardiovascular;  Laterality: N/A;  . CHOLECYSTECTOMY N/A 02/24/2015   Procedure: LAPAROSCOPIC CHOLECYSTECTOMY;  Surgeon: [name], MD;  Location: [facility];  Service: General;  Laterality: N/A;  . RIGHT/LEFT HEART CATH AND CORONARY ANGIOGRAPHY N/A 08/09/2017   Procedure: RIGHT/LEFT HEART CATH AND CORONARY ANGIOGRAPHY;  Surgeon: [name], MD;  Location: [facility] CV LAB;  Service: Cardiovascular;  Laterality: N/A;  . TEE WITHOUT CARDIOVERSION N/A 07/04/2017   Procedure: TRANSESOPHAGEAL ECHOCARDIOGRAM (TEE);  Surgeon: [name], MD;  Location: [facility] ENDOSCOPY;  Service: Cardiovascular;  Laterality: N/A;     Medications Prior to Admission: Prior to Admission medications   Medication Sig  Start Date End Date Taking? Authorizing Provider  acetaminophen (TYLENOL) 325 MG tablet Take 325 mg by mouth every 6 (six) hours as needed for moderate pain or headache.    [provider]  apixaban (ELIQUIS) 5 MG TABS tablet Take 1 tablet (5 mg total) by mouth 2 (two) times daily. 08/30/17   Sherran Needs, NP  diltiazem (CARDIZEM CD) 120 MG 24 hr capsule Take 1 capsule (120 mg total) by mouth 2 (two) times daily. 10/28/17   Sherran Needs, NP  furosemide (LASIX) 40 MG tablet Take 1  tablet (40 mg total) by mouth daily as needed for fluid. Patient taking differently: Take 40 mg by mouth See admin instructions. Take 40mg  by mouth daily, may take additional 20mg  if needed for fluid retention pain 10/18/17   Sherran Needs, NP  Hyaluronic Acid 20 MG/ML GEL Inject 20 mg as directed every 3 (three) months.     [provider]  Hyaluronic Acid 20-60 MG CAPS Take 20 mg by mouth daily.    [provider]  Magnesium 250 MG TABS Take 250 mg by mouth daily.    [provider]  Multiple Vitamin (MULTIVITAMIN WITH MINERALS) TABS tablet Take 1 tablet by mouth daily.     [provider]  NON FORMULARY Take 1 tablet by mouth at bedtime. Doterra Serenity Supplement    [provider]  NON FORMULARY Take 1 tablet by mouth daily. TriEase Supplement    [provider]  NON FORMULARY Take 1 tablet by mouth daily. Cardio For Life     [provider]  NON FORMULARY Take 1 drop by mouth daily as needed (cold). Colloidal silver - topically and orally for colds and ear infections     [provider]  polyethylene glycol powder (GLYCOLAX/MIRALAX) powder Take 17 g by mouth daily. 01/17/18   Dennard Nip D, MD  potassium chloride SA (K-DUR,KLOR-CON) 20 MEQ tablet Take 1 tablet (20 mEq total) by mouth daily. 08/26/17   Sherran Needs, NP  Vitamin D, Ergocalciferol, (DRISDOL) 50000 units CAPS capsule Take 1 capsule (50,000 Units total) by mouth every 7 (seven) days. 01/17/18   Starlyn Skeans, MD     Allergies:    Allergies  Allergen Reactions  . Other Other (See Comments)    Metals Polyester - including hospital gowns and sheets - allergic contact dermatitis  . Sulfur Hives    Social History:   Social History   Socioeconomic History  . Marital status: Married    Spouse name: Not on file  . Number of children: 4  . Years of education: Not on file  . Highest education level: Not on file  Occupational History  .  Occupation: Disabled/retired  Social Needs  . Financial resource strain: Not on file  . Food insecurity:    Worry: Not on file    Inability: Not on file  . Transportation needs:    Medical: Not on file    Non-medical: Not on file  Tobacco Use  . Smoking status: Never Smoker  . Smokeless tobacco: Never Used  Substance and Sexual Activity  . Alcohol use: Yes    Comment: little  . Drug use: No  . Sexual activity: Not Currently  Lifestyle  . Physical activity:    Days per week: Not on file    Minutes per session: Not on file  . Stress: Not on file  Relationships  . Social connections:    Talks on phone: Not  on file    Gets together: Not on file    Attends religious service: Not on file    Active member of club or organization: Not on file    Attends meetings of clubs or organizations: Not on file    Relationship status: Not on file  . Intimate partner violence:    Fear of current or ex partner: Not on file    Emotionally abused: Not on file    Physically abused: Not on file    Forced sexual activity: Not on file  Other Topics Concern  . Not on file  Social History Narrative   Lives in [place]   Not working    Family History:   The patient's family history includes Diabetes in her father and mother; Heart attack in her father; Heart disease in her father and mother; Hyperlipidemia in her father and mother; Hypertension in her father and mother; Obesity in her mother; Sudden death in her mother; Thyroid disease in her mother.    ROS:  Please see the history of present illness.  All other ROS reviewed and negative.     Physical Exam/Data:  There were no vitals filed for this visit. No intake or output data in the 24 hours ending 02/07/18 1139 There were no vitals filed for this visit. There is no height or weight on file to calculate BMI.  General:  Well nourished, well developed, in no acute distress HEENT: normal Lymph: no adenopathy Neck: no JVD Endocrine:  No  thryomegaly Vascular: No carotid bruits Cardiac:  iRRR; no murmurs, gallops or rubs Lungs:  CTA b/l, no wheezing, rhonchi or rales  Abd: soft, nontender, no hepatomegaly  Ext: no edema Musculoskeletal:  No deformities Skin: warm and dry  Neuro:  No gross focal abnormalities noted Psych:  Normal affect    EKG:  The ECG that was done today was personally reviewed  AFib 91bpm, mannually measured by myself QT 335ms, QTc 457ms  Relevant CV Studies:  08/09/17: LHC  Normal left main.  Normal left anterior descending coronary artery.  Normal circumflex coronary artery.  Normal dominant RCA.  Normal LV size with normal hemodynamics and estimated ejection fraction of 50%.  False positive myocardial perfusion imaging.  Mild pulmonary hypertension with mean pressure of 27 mmHg.  RECOMMENDATIONS:  Management of atrial fibrillation with aggressive rate control and stroke prophylaxis.  07/04/17: TEE Study Conclusions - Left ventricle: Systolic function was normal. The estimated   ejection fraction was in the range of 50% to 55%. No evidence of   thrombus. - Ventricular septum: The contour showed a normal configuration.   There was no evidence of a ventricular septal defect. - Mitral valve: There was mild regurgitation. - Left atrium: No formed thrombus. Dense spontaneous contrast No   evidence of thrombus in the atrial cavity or appendage. - Right ventricle: The cavity size was mildly dilated. - Atrial septum: There was a patent foramen ovale. - Impressions: Advanced 3D rendering of the atrial septum was   performed. [place] x 1 150 Joules   Converted from afib rate 110 to NSR rate 80&'s   No immediate neurologic sequela Impressions: - Advanced 3D rendering of the atrial septum was performed. [place] x 1   150 Joules   Converted from afib rate 110 to NSR rate 80&'s   No immediate neurologic sequela Successful cardioversion. No   cardiac source of emboli was indentified.  06/27/17:  TTE Study Conclusions - Left ventricle: The cavity size was normal. Wall  thickness was   increased in a pattern of mild LVH. Systolic function was normal.   The estimated ejection fraction was in the range of 60% to 65%.   Wall motion was normal; there were no regional wall motion   abnormalities. The study is not technically sufficient to allow   evaluation of LV diastolic function. - Mitral valve: Calcified annulus. Mildly thickened leaflets .   There was mild regurgitation. Valve area by continuity equation   (using LVOT flow): 2.07 cm^2. - Left atrium: The atrium was mildly dilated. (34mm) - Atrial septum: No defect or patent foramen ovale was identified. - Pulmonary arteries: PA peak pressure: 31 mm Hg (S).    Laboratory Data:  Chemistry Recent Labs  Lab 02/07/18 1028  NA 140  K 4.9  CL 109  CO2 22  GLUCOSE 123*  BUN 17  CREATININE 0.91  CALCIUM 9.2  GFRNONAA >60  GFRAA >60  ANIONGAP 9    No results for input(s): PROT, ALBUMIN, AST, ALT, ALKPHOS, BILITOT in the last 168 hours. HematologyNo results for input(s): WBC, RBC, HGB, HCT, MCV, MCH, MCHC, RDW, PLT in the last 168 hours. Cardiac EnzymesNo results for input(s): TROPONINI in the last 168 hours. No results for input(s): TROPIPOC in the last 168 hours.  BNPNo results for input(s): BNP, PROBNP in the last 168 hours.  DDimer No results for input(s): DDIMER in the last 168 hours.  Radiology/Studies:  No results found.  Assessment and Plan:   1. Persistent AFib, here for Tikosyn load     CHA2DS2Vasc is one, on Eliquis     K+ 4.9     Mag 2.3     Creat 0.91 (Calc CrCl is 110)     QT is 419ms, OK to start  DCCV Thursday if not in SR The patient is agreeable  2. Hx of using numerous OTC supplements     The patient reports she is not taking anything that had not been approved by the [facility] in our office.        The supplements listed on file is apparently incomplete and inaccurate.  She has a list "somewhere in  her bag" but again, states it had previously been reviewed.  I have asked that she locate the list and consulted pharmacy to make sure we have a accurate list and nothing is contraindicated with tikosyn    For questions or updates, please contact [place] Please consult [email or web address] for contact info under Cardiology/STEMI.    Signed, Baldwin Jamaica, PA-C  02/07/2018 11:39 AM    I have seen, examined the patient, and reviewed the above assessment and plan.  Changes to above are made where necessary.  On exam, iRRR.  Pt compliant with anticoagulation without interruption.  Will admit for initiation of tikosyn at this time.  Co Sign: [name], MD 02/07/2018 2:37 PM

## 2018-02-07 NOTE — Progress Notes (Signed)
Pharmacy Review for Dofetilide (Tikosyn) Initiation  Admit Complaint: 62 y.o. female admitted 02/07/2018 with atrial fibrillation to be initiated on dofetilide.   Assessment:  Patient Exclusion Criteria: If any screening criteria checked as "Yes", then  patient  should NOT receive dofetilide until criteria item is corrected. If "Yes" please indicate correction plan.  YES  NO Patient  Exclusion Criteria Correction Plan  [x]  []  Baseline QTc interval is greater than or equal to 440 msec. IF above YES box checked dofetilide contraindicated unless patient has ICD; then may proceed if QTc 500-550 msec or with known ventricular conduction abnormalities may proceed with QTc 550-600 msec. QTc =  455 ms;  432ms, OK to start per Tommye Standard, PA-C   []  [x]  Magnesium level is less than 1.8 mEq/l : Last magnesium:  Lab Results  Component Value Date   MG 2.3 02/07/2018         []  [x]  Potassium level is less than 4 mEq/l : Last potassium:  Lab Results  Component Value Date   K 4.9 02/07/2018         []  [x]  Patient is known or suspected to have a digoxin level greater than 2 ng/ml: No results found for: DIGOXIN    []  [x]  Creatinine clearance less than 20 ml/min (calculated using Cockcroft-Gault, actual body weight and serum creatinine): Estimated Creatinine Clearance: 78.7 mL/min (by C-G formula based on SCr of 0.91 mg/dL).    []  [x]  Patient has received drugs known to prolong the QT intervals within the last 48 hours (phenothiazines, tricyclics or tetracyclic antidepressants, erythromycin, H-1 antihistamines, cisapride, fluoroquinolones, azithromycin). Drugs not listed above may have an, as yet, undetected potential to prolong the QT interval, updated information on QT prolonging agents is available at this website:QT prolonging agents   []  [x]  Patient received a dose of hydrochlorothiazide (Oretic) alone or in any combination including triamterene (Dyazide, Maxzide) in the last 48 hours.   []  [x]   Patient received a medication known to increase dofetilide plasma concentrations prior to initial dofetilide dose:  . Trimethoprim (Primsol, Proloprim) in the last 36 hours . Verapamil (Calan, Verelan) in the last 36 hours or a sustained release dose in the last 72 hours . Megestrol (Megace) in the last 5 days  . Cimetidine (Tagamet) in the last 6 hours . Ketoconazole (Nizoral) in the last 24 hours . Itraconazole (Sporanox) in the last 48 hours  . Prochlorperazine (Compazine) in the last 36 hours    []  [x]  Patient is known to have a history of torsades de pointes; congenital or acquired long QT syndromes.   []  [x]  Patient has received a Class 1 antiarrhythmic with less than 2 half-lives since last dose. (Disopyramide, Quinidine, Procainamide, Lidocaine, Mexiletine, Flecainide, Propafenone)   []  [x]  Patient has received amiodarone therapy in the past 3 months or amiodarone level is greater than 0.3 ng/ml.    Patient has been appropriately anticoagulated with Eliquis  Ordering provider was confirmed at [email or web address] if they are not listed on the [facility] Prescribers list.  Goal of Therapy: Follow renal function, electrolytes, potential drug interactions, and dose adjustment. Provide education and 1 week supply at discharge.  Plan:  [x]   Physician selected initial dose within range recommended for patients level of renal function - will monitor for response.  []   Physician selected initial dose outside of range recommended for patients level of renal function - will discuss if the dose should be altered at this time.   Select One Calculated CrCl  Dose q12h  [x]  > 60 ml/min 500 mcg  []  40-60 ml/min 250 mcg  []  20-40 ml/min 125 mcg   2. Follow up QTc after the first 5 doses, renal function, electrolytes (K & Mg) daily x 3     days, dose adjustment, success of initiation and facilitate 1 week discharge supply as     clinically indicated.  3. Initiate Tikosyn education video  (Call [phone] and ask for Tikosyn Video # 116).  4. Place Enrollment Form on the chart for discharge supply of dofetilide.  Nicole Cella, RPh Clinical Pharmacist Please check AMION for all [facility] phone numbers After 10:00 PM, call [place] [phone]  12:50 PM 02/07/2018

## 2018-02-07 NOTE — Care Management (Signed)
02-07-18  BENEFITS CHECK :  CORRECTION FOR :  TIKOSYN  125. MCG , 250 MCG AND 500 MCG BID COVER- NOT [place]- YES # [phone] FOR [facility] Relic

## 2018-02-07 NOTE — Progress Notes (Signed)
Primary Care Physician: Patient, No Pcp Per Referring Physician: [facility] f/u Cardiologist: Dr. Linard Millers EP: Dr. [name] is a 62 y.o. female with a h/o prior AF with gall bladder surgery in 2016,  CHF, obesity, untreated sleep apnea, that was hospitalized 1/19 to 1/23  for afib with RVR.  She was admitted to cardiology with acute on chronic congestive heart failure, type unknown, and atrial fibrillation with RVR, likely secondary to heart failure exacerbation.   No ischemic workup in past. She was not on a home diuretic regimen. She was diuresed with 40 mg IV lasix BID.She was transitioned to [employer] today.  Weight is down 17 lbs from admission - discharge weight is 249 lbs.Orthostatics vitals negative for hypotension. She was euvolemic on exam. She discharged on 40 mg lasix daily and was instructed to weigh daily and keep a log.   She had TEE guided cardioversion 1/28 which was initially successful but then had ERAF on  f/u appointment with general cardiology 2/07. However, it is unclear how long she has had persistent afib after she developed in 2016 and was lost to f/u until January of this year. However, pt reports that she felt well until Christmas when she started retaining fluid and noted the shortness of breath.  She is in the afib clinic, 07/19/17 to discuss options to restore SR. We discussed flecainide and scheduled a Myoview which showed risk for CAD. She then went on to have a cath without any significant CAD.  F/u in afib clinic, 4/11, she ultimately was loaded on flecainide 100 mg bid and went on to have successful cardioversion but returns in afib today. Her cardizem was reduced to 120 mg bid from 300 mg daily for soft BP's last week.   Her heart rate is 122 bpm today but reports still with  soft BP's at home.   F/u in afib clinic 8/13. Pt is here from f/u with [name] for her persistent atrial flutter. She was offered ablation but she deferred. They discussed  Tikosyn but pt wanted to wait. She is now ready to commit in the next couple of weeks to get set up for hospitalization for Tikosyn. She is taking a lot of supplements and has been asked to stop these. She wants to take branded Tikosyn and can afford this, around 24$ a month.  F/u in afib clinic, 02/07/18. Pt is here for Tikosyn admit. She states no benadryl use. No missed doses of Eliquis.    Today, she denies symptoms of palpitations, chest pain, shortness of breath, orthopnea, PND, lower extremity edema, dizziness, presyncope, syncope, or neurologic sequela. The patient is tolerating medications without difficulties and is otherwise without complaint today.   Past Medical History:  Diagnosis Date  . Asthma   . Atrial fibrillation ([place])   . Back pain   . Diastolic dysfunction   . Fibromyalgia   . Gestational diabetes   . Knee pain   . Osteoarthritis   . Persistent atrial fibrillation ([place])   . Sleep apnea    Past Surgical History:  Procedure Laterality Date  . ABDOMINAL HYSTERECTOMY    . APPENDECTOMY    . CARDIOVERSION N/A 07/04/2017   Procedure: CARDIOVERSION;  Surgeon: [name], MD;  Location: [facility] ENDOSCOPY;  Service: Cardiovascular;  Laterality: N/A;  . CARDIOVERSION N/A 09/06/2017   Procedure: CARDIOVERSION;  Surgeon: [name], MD;  Location: [facility] ENDOSCOPY;  Service: Cardiovascular;  Laterality: N/A;  . CHOLECYSTECTOMY N/A 02/24/2015  Procedure: LAPAROSCOPIC CHOLECYSTECTOMY;  Surgeon: [name], MD;  Location: [facility];  Service: General;  Laterality: N/A;  . RIGHT/LEFT HEART CATH AND CORONARY ANGIOGRAPHY N/A 08/09/2017   Procedure: RIGHT/LEFT HEART CATH AND CORONARY ANGIOGRAPHY;  Surgeon: [name], MD;  Location: [facility] CV LAB;  Service: Cardiovascular;  Laterality: N/A;  . TEE WITHOUT CARDIOVERSION N/A 07/04/2017   Procedure: TRANSESOPHAGEAL ECHOCARDIOGRAM (TEE);  Surgeon: [name], MD;  Location: [facility] ENDOSCOPY;  Service: Cardiovascular;  Laterality: N/A;      Current Outpatient Medications  Medication Sig Dispense Refill  . acetaminophen (TYLENOL) 325 MG tablet Take 325 mg by mouth every 6 (six) hours as needed for moderate pain or headache.    [name] apixaban (ELIQUIS) 5 MG TABS tablet Take 1 tablet (5 mg total) by mouth 2 (two) times daily. 60 tablet 1  . diltiazem (CARDIZEM CD) 120 MG 24 hr capsule Take 1 capsule (120 mg total) by mouth 2 (two) times daily. 180 capsule 2  . furosemide (LASIX) 40 MG tablet Take 1 tablet (40 mg total) by mouth daily as needed for fluid. (Patient taking differently: Take 40 mg by mouth See admin instructions. Take 40mg  by mouth daily, may take additional 20mg  if needed for fluid retention pain) 45 tablet 1  . Hyaluronic Acid 20 MG/ML GEL Inject 20 mg as directed every 3 (three) months.     . Magnesium 250 MG TABS Take 250 mg by mouth daily.    . Multiple Vitamin (MULTIVITAMIN WITH MINERALS) TABS tablet Take 1 tablet by mouth daily.     . polyethylene glycol powder (GLYCOLAX/MIRALAX) powder Take 17 g by mouth daily. 3350 g 0  . potassium chloride SA (K-DUR,KLOR-CON) 20 MEQ tablet Take 1 tablet (20 mEq total) by mouth daily. 90 tablet 2  . Vitamin D, Ergocalciferol, (DRISDOL) 50000 units CAPS capsule Take 1 capsule (50,000 Units total) by mouth every 7 (seven) days. 4 capsule 0  . Hyaluronic Acid 20-60 MG CAPS Take 20 mg by mouth daily.    . NON FORMULARY Take 1 tablet by mouth at bedtime. Doterra Serenity Supplement    . NON FORMULARY Take 1 tablet by mouth daily. TriEase Supplement    . NON FORMULARY Take 1 tablet by mouth daily. Cardio For Life     . NON FORMULARY Take 1 drop by mouth daily as needed (cold). Colloidal silver - topically and orally for colds and ear infections      No current facility-administered medications for this encounter.     Allergies  Allergen Reactions  . Other Other (See Comments)    Metals Polyester - including hospital gowns and sheets - allergic contact dermatitis  . Sulfur Hives     Social History   Socioeconomic History  . Marital status: Married    Spouse name: Not on file  . Number of children: 4  . Years of education: Not on file  . Highest education level: Not on file  Occupational History  . Occupation: Disabled/retired  Social Needs  . Financial resource strain: Not on file  . Food insecurity:    Worry: Not on file    Inability: Not on file  . Transportation needs:    Medical: Not on file    Non-medical: Not on file  Tobacco Use  . Smoking status: Never Smoker  . Smokeless tobacco: Never Used  Substance and Sexual Activity  . Alcohol use: Yes    Comment: little  . Drug use: No  . Sexual activity:  Not Currently  Lifestyle  . Physical activity:    Days per week: Not on file    Minutes per session: Not on file  . Stress: Not on file  Relationships  . Social connections:    Talks on phone: Not on file    Gets together: Not on file    Attends religious service: Not on file    Active member of club or organization: Not on file    Attends meetings of clubs or organizations: Not on file    Relationship status: Not on file  . Intimate partner violence:    Fear of current or ex partner: Not on file    Emotionally abused: Not on file    Physically abused: Not on file    Forced sexual activity: Not on file  Other Topics Concern  . Not on file  Social History Narrative   Lives in [place]   Not working    Family History  Problem Relation Age of Onset  . Heart disease Mother   . Hypertension Mother   . Hyperlipidemia Mother   . Diabetes Mother   . Sudden death Mother   . Thyroid disease Mother   . Obesity Mother   . Heart disease Father   . Heart attack Father   . Hypertension Father   . Hyperlipidemia Father   . Diabetes Father     ROS- All systems are reviewed and negative except as per the HPI above  Physical Exam: Vitals:   02/07/18 0953  BP: 112/76  Pulse: 91  Weight: 109.1 kg  Height: 5\' 5"  (1.651 m)   Wt  Readings from Last 3 Encounters:  02/07/18 109.1 kg  02/07/18 107.5 kg  01/17/18 108.4 kg    Labs: Lab Results  Component Value Date   NA 138 01/17/2018   K 4.0 01/17/2018   CL 106 01/17/2018   CO2 22 01/17/2018   GLUCOSE 93 01/17/2018   BUN 27 (H) 01/17/2018   CREATININE 0.84 01/17/2018   CALCIUM 9.1 01/17/2018   MG 2.2 01/17/2018   Lab Results  Component Value Date   INR 1.07 08/09/2017   Lab Results  Component Value Date   CHOL 158 11/02/2017   HDL 30 (L) 11/02/2017   LDLCALC 98 11/02/2017   TRIG 151 (H) 11/02/2017     GEN- The patient is well appearing, alert and oriented x 3 today.   Head- normocephalic, atraumatic Eyes-  Sclera clear, conjunctiva pink Ears- hearing intact Oropharynx- clear Neck- supple, no JVP Lymph- no cervical lymphadenopathy Lungs- Clear to ausculation bilaterally, normal work of breathing Heart- irregular rate and rhythm, no murmurs, rubs or gallops, PMI not laterally displaced GI- soft, NT, ND, + BS Extremities- no clubbing, cyanosis, or edema MS- no significant deformity or atrophy Skin- no rash or lesion Psych- euthymic mood, full affect Neuro- strength and sensation are intact  EKG- atrial fib  at 91 bpm, Qrs int 86 ms, qtc 455 ms Echo-Study Conclusions  - Left ventricle: The cavity size was normal. Wall thickness was   increased in a pattern of mild LVH. Systolic function was normal.   The estimated ejection fraction was in the range of 60% to 65%.   Wall motion was normal; there were no regional wall motion   abnormalities. The study is not technically sufficient to allow   evaluation of LV diastolic function. - Mitral valve: Calcified annulus. Mildly thickened leaflets .   There was mild regurgitation. Valve area by continuity equation   (  using LVOT flow): 2.07 cm^2. - Left atrium: The atrium was mildly dilated. - Atrial septum: No defect or patent foramen ovale was identified. TEE-Study Conclusions  - Left ventricle:  Systolic function was normal. The estimated   ejection fraction was in the range of 50% to 55%. No evidence of   thrombus. - Ventricular septum: The contour showed a normal configuration.   There was no evidence of a ventricular septal defect. - Mitral valve: There was mild regurgitation. - Left atrium: No formed thrombus. Dense spontaneous contrast No   evidence of thrombus in the atrial cavity or appendage. - Right ventricle: The cavity size was mildly dilated. - Atrial septum: There was a patent foramen ovale. - Impressions: Advanced 3D rendering of the atrial septum was   performed. [place] x 1 150 Joules   Converted from afib rate 110 to NSR rate 80&'s   No immediate neurologic sequela  Stress test- Summary Defect 1:  There is a medium defect of moderate severity. The defect is reversible.  Overall Study Impression Myocardial perfusion is abnormal. Findings consistent with ischemia. This is an intermediate risk study. Overall left ventricular systolic function was normal. LV cavity size is normal. Nuclear stress EF: 43%.       Cath report-Conclusion    Normal left main.  Normal left anterior descending coronary artery.  Normal circumflex coronary artery.  Normal dominant RCA.  Normal LV size with normal hemodynamics and estimated ejection fraction of 50%.  False positive myocardial perfusion imaging.  Mild pulmonary hypertension with mean pressure of 27 mmHg.  RECOMMENDATIONS:   Management of atrial fibrillation with aggressive rate control and stroke prophylaxis     Impressions:  - Advanced 3D rendering of the atrial septum was performed. [name] x 1   150 Joules   Converted from afib rate 110 to NSR rate 80&'s   No immediate neurologic sequela Successful cardioversion. No   cardiac source of emboli was indentified.   Assessment and Plan: 1. Persistent afib Flecainide used in past  and with successful cardioversion, but with ERAF Flecainide  stopped    Diltiazem 120 mg bid Here for Tikosyn admit Continue eliquis without missed doses for a chadsvasc score of 3, states no missed doses Reminded to stop all supplements that may interfere with Tikosyn( see pharmacy note), she states that she has  Bmet/mag today Qtc acceptable at 455 ms Wants brand Tikosyn on discharge as her insurance company states that they  will pay for it  2. Lifestyle issues Working on weight loss measures  She has untreated sleep apnea 2/2 not being able to tolerate cpap   Butch Penny C. Thomasa Heidler, [facility] [address] [place], [place] [address] [phone]

## 2018-02-08 LAB — BASIC METABOLIC PANEL
ANION GAP: 9 (ref 5–15)
BUN: 22 mg/dL (ref 8–23)
CALCIUM: 9.2 mg/dL (ref 8.9–10.3)
CO2: 27 mmol/L (ref 22–32)
Chloride: 103 mmol/L (ref 98–111)
Creatinine, Ser: 0.98 mg/dL (ref 0.44–1.00)
GFR calc non Af Amer: >60 mL/min (ref >60)
Glucose, Bld: 115 mg/dL — ABNORMAL HIGH (ref 70–99)
Potassium: 4.1 mmol/L (ref 3.5–5.1)
Sodium: 139 mmol/L (ref 135–145)

## 2018-02-08 LAB — MAGNESIUM: Magnesium: 2.4 mg/dL (ref 1.7–2.4)

## 2018-02-08 MED ORDER — SODIUM CHLORIDE 0.9 % IV SOLN: 250.0000 mL | INTRAVENOUS | Status: DC

## 2018-02-08 MED ORDER — SODIUM CHLORIDE 0.9% FLUSH: 3.0000 mL | INTRAVENOUS | Status: DC | PRN

## 2018-02-08 MED ORDER — SODIUM CHLORIDE 0.9% FLUSH
3.0000 mL | Freq: Two times a day (BID) | INTRAVENOUS | Status: DC
  Administered 2018-02-08 – 2018-02-11 (×4): 3 mL via INTRAVENOUS

## 2018-02-08 MED ORDER — HYDROCORTISONE 1 % EX CREA
1.0000 "application " | TOPICAL_CREAM | Freq: Three times a day (TID) | CUTANEOUS | Status: DC | PRN
  Filled 2018-02-08: qty 28

## 2018-02-08 NOTE — Progress Notes (Addendum)
Progress Note  Patient Name: Christie Atkinson Date of Encounter: 02/08/2018  Primary Cardiologist: Sinclair Grooms, MD   Subjective   Feels well, slept well.  Tolerating drug  Inpatient Medications    Scheduled Meds: . apixaban  5 mg Oral BID  . diltiazem  120 mg Oral BID  . dofetilide  500 mcg Oral BID  . furosemide  40 mg Oral Daily  . magnesium oxide  400 mg Oral Daily  . multivitamin with minerals  1 tablet Oral Daily  . potassium chloride SA  20 mEq Oral Daily  . sodium chloride flush  3 mL Intravenous Q12H   Continuous Infusions: . sodium chloride     PRN Meds: sodium chloride, acetaminophen, sodium chloride flush   Vital Signs    Vitals:   02/07/18 1506 02/07/18 2007 02/07/18 2340 02/08/18 0643  BP: 99/64 110/65 (!) 109/53 118/84  Pulse: (!) 53  93 76  Resp:    18  Temp: 98.3 F (36.8 C)  97.6 F (36.4 C) 97.8 F (36.6 C)  TempSrc: Oral  Oral Oral  SpO2: 95%  97% 97%  Weight:    107.1 kg  Height:        Intake/Output Summary (Last 24 hours) at 02/08/2018 0646 Last data filed at 02/07/2018 2008 Gross per 24 hour  Intake 3 ml  Output -  Net 3 ml   Filed Weights   02/07/18 1123 02/08/18 0643  Weight: 109.4 kg 107.1 kg    Telemetry    AFib 80's generally, fater with exertion - Personally Reviewed  ECG    AFib 77bpm, QT is difficult to assess, manually measured 420-413ms, corrects to 476-48ms - Personally Reviewed  Physical Exam    GEN: No acute distress.   Neck: No JVD Cardiac: iRRR, no murmurs, rubs, or gallops.  Respiratory: CTA b/l. GI: Soft, nontender, non-distended  MS: No edema; No deformity. Neuro:  Nonfocal  Psych: Normal affect   Labs    Chemistry Recent Labs  Lab 02/07/18 1028 02/08/18 0359  NA 140 139  K 4.9 4.1  CL 109 103  CO2 22 27  GLUCOSE 123* 115*  BUN 17 22  CREATININE 0.91 0.98  CALCIUM 9.2 9.2  GFRNONAA >60 >60  GFRAA >60 >60  ANIONGAP 9 9     HematologyNo results for input(s): WBC, RBC, HGB,  HCT, MCV, MCH, MCHC, RDW, PLT in the last 168 hours.  Cardiac EnzymesNo results for input(s): TROPONINI in the last 168 hours. No results for input(s): TROPIPOC in the last 168 hours.   BNPNo results for input(s): BNP, PROBNP in the last 168 hours.   DDimer No results for input(s): DDIMER in the last 168 hours.   Radiology    No results found.  Cardiac Studies   08/09/17: LHC  Normal left main.  Normal left anterior descending coronary artery.  Normal circumflex coronary artery.  Normal dominant RCA.  Normal LV size with normal hemodynamics and estimated ejection fraction of 50%.  False positive myocardial perfusion imaging.  Mild pulmonary hypertension with mean pressure of 27 mmHg.  RECOMMENDATIONS:  Management of atrial fibrillation with aggressive rate control and stroke prophylaxis.  07/04/17: TEE Study Conclusions - Left ventricle: Systolic function was normal. The estimated ejection fraction was in the range of 50% to 55%. No evidence of thrombus. - Ventricular septum: The contour showed a normal configuration. There was no evidence of a ventricular septal defect. - Mitral valve: There was mild regurgitation. - Left  atrium: No formed thrombus. Dense spontaneous contrast No evidence of thrombus in the atrial cavity or appendage. - Right ventricle: The cavity size was mildly dilated. - Atrial septum: There was a patent foramen ovale. - Impressions: Advanced 3D rendering of the atrial septum was performed. [place] x 1 150 Joules Converted from afib rate 110 to NSR rate 80&'s No immediate neurologic sequela Impressions: - Advanced 3D rendering of the atrial septum was performed. [place] x 1 150 Joules Converted from afib rate 110 to NSR rate 80&'s No immediate neurologic sequela Successful cardioversion. No cardiac source of emboli was indentified.  06/27/17: TTE Study Conclusions - Left ventricle: The cavity size was normal. Wall thickness  was increased in a pattern of mild LVH. Systolic function was normal. The estimated ejection fraction was in the range of 60% to 65%. Wall motion was normal; there were no regional wall motion abnormalities. The study is not technically sufficient to allow evaluation of LV diastolic function. - Mitral valve: Calcified annulus. Mildly thickened leaflets . There was mild regurgitation. Valve area by continuity equation (using LVOT flow): 2.07 cm^2. - Left atrium: The atrium was mildly dilated. (43mm) - Atrial septum: No defect or patent foramen ovale was identified. - Pulmonary arteries: PA peak pressure: 31 mm Hg (S).  Patient Profile     62 y.o. female with a history of prior AF with gall bladder surgery in 2016, CHF, obesity, untreated sleep apnea, that was hospitalized 1/19 to 1/23 for afib with RVR >> persistent AFib, admitted for Tikosyn initiation  Assessment & Plan    1. Persistent AFib, here for Tikosyn load     CHA2DS2Vasc is one, on Eliquis     K+ 4.1     Mag 2.4     Creat 0.99      QT difficult to assess accurately, I measure QT 420-435ms QTc 476-498  DCCV Tomorrow if not in SR The patient remains agreeable  2. Hx of using numerous OTC supplements     Appreciate pharmacy evaluation     Current supplements ok    For questions or updates, please contact [facility] Please consult [email or web address] for contact info under Cardiology/STEMI.      Signed, Baldwin Jamaica, PA-C  02/08/2018, 6:46 AM     I have seen, examined the patient, and reviewed the above assessment and plan.  Changes to above are made where necessary.  On exam, iRRR. QT is stable after first tikosyn dose.  Continue to monitor.  Will make NPO for possible cardioversion tomorrow.  Co Sign: [name], MD 02/08/2018

## 2018-02-08 NOTE — Progress Notes (Signed)
Office: [phone]  /  Fax: [phone]   HPI:   Chief Complaint: OBESITY Christie Atkinson is here to discuss her progress with her obesity treatment plan. She is on the Category 2 plan and is following her eating plan approximately 85 % of the time. She states she is exercising 0 minutes 0 times per week. [name] hasn't taken her water pill as prescribed, and she recently lost her 63 year old pet. [name] may have over indulged in chips and salty snacks. [name] will be going into the hospital for transition to [place]. [name] has not been exercising. Her weight is 237 lb (107.5 kg) today and has not lost weight since her last visit. She has lost 12 lbs since starting treatment with [name].  Atrial Fibrillation [name] has a diagnosis of Afib and she has a pending cardioversion admission. [name] sees [name] and [name].  Vitamin D deficiency [name] has a diagnosis of vitamin D deficiency. [name] is currently taking vit D and she admits fatigue, but denies nausea, vomiting or muscle weakness.  ALLERGIES: Allergies  Allergen Reactions  . Other Other (See Comments)    Metals Polyester - including hospital gowns and sheets - allergic contact dermatitis  . Sulfur Hives    MEDICATIONS: Current Facility-Administered Medications on File Prior to Visit  Medication Dose Route Frequency Provider Last Rate Last Dose  . 0.9 %  sodium chloride infusion  250 mL Intravenous PRN Baldwin Jamaica, PA-C      . acetaminophen (TYLENOL) tablet 325 mg  325 mg Oral Q6H PRN Baldwin Jamaica, PA-C      . apixaban Arne Cleveland) tablet 5 mg  5 mg Oral BID Baldwin Jamaica, PA-C   5 mg at 02/07/18 2100  . diltiazem (CARDIZEM CD) 24 hr capsule 120 mg  120 mg Oral BID Baldwin Jamaica, PA-C   120 mg at 02/07/18 2100  . dofetilide (TIKOSYN) capsule 500 mcg  500 mcg Oral BID Baldwin Jamaica, PA-C   500 mcg at 02/07/18 2006  . furosemide (LASIX) tablet 40 mg  40 mg Oral Daily Baldwin Jamaica, PA-C       . magnesium oxide (MAG-OX) tablet 400 mg  400 mg Oral Daily Baldwin Jamaica, PA-C   400 mg at 02/07/18 1500  . multivitamin with minerals tablet 1 tablet  1 tablet Oral Daily Baldwin Jamaica, PA-C   1 tablet at 02/07/18 1500  . potassium chloride SA (K-DUR,KLOR-CON) CR tablet 20 mEq  20 mEq Oral Daily Baldwin Jamaica, PA-C   20 mEq at 02/07/18 1500  . sodium chloride flush (NS) 0.9 % injection 3 mL  3 mL Intravenous Q12H Baldwin Jamaica, PA-C   3 mL at 02/07/18 2100  . sodium chloride flush (NS) 0.9 % injection 3 mL  3 mL Intravenous PRN Baldwin Jamaica, PA-C       Current Outpatient Medications on File Prior to Visit  Medication Sig Dispense Refill  . acetaminophen (TYLENOL) 325 MG tablet Take 325 mg by mouth every 6 (six) hours as needed for moderate pain or headache.    [name] apixaban (ELIQUIS) 5 MG TABS tablet Take 1 tablet (5 mg total) by mouth 2 (two) times daily. 60 tablet 1  . diltiazem (CARDIZEM CD) 120 MG 24 hr capsule Take 1 capsule (120 mg total) by mouth 2 (two) times daily. 180 capsule 2  . furosemide (LASIX) 40 MG tablet Take 1 tablet (40 mg total) by mouth daily as needed for  fluid. (Patient taking differently: Take 40 mg by mouth See admin instructions. Take 40mg  by mouth daily, may take additional 20mg  if needed for fluid retention pain) 45 tablet 1  . Hyaluronic Acid 20 MG/ML GEL Inject 20 mg as directed every 6 (six) months.     . Hyaluronic Acid 20-60 MG CAPS Take 20 mg by mouth daily.    . Magnesium 250 MG TABS Take 250 mg by mouth daily.    . Multiple Vitamin (MULTIVITAMIN WITH MINERALS) TABS tablet Take 1 tablet by mouth daily.     . NON FORMULARY Take 1 tablet by mouth every three (3) days as needed (for seasonal allergies/congestion). TriEase Supplement     . NON FORMULARY Take 1 Scoop by mouth daily as needed (for hydration or after a fast). Cardio For Life . Mix in 8 oz of fluid    . NON FORMULARY Take 1 drop by mouth daily as needed (cold; only if sick with a  cold/congestion/sore throat). Colloidal silver - topically and orally for colds and ear infections     . polyethylene glycol powder (GLYCOLAX/MIRALAX) powder Take 17 g by mouth daily. 3350 g 0  . potassium chloride SA (K-DUR,KLOR-CON) 20 MEQ tablet Take 1 tablet (20 mEq total) by mouth daily. 90 tablet 2  . Vitamin D, Ergocalciferol, (DRISDOL) 50000 units CAPS capsule Take 1 capsule (50,000 Units total) by mouth every 7 (seven) days. 4 capsule 0  . NON FORMULARY Take 1 Scoop by mouth. Collagen. Mix with food once daily.     . NON FORMULARY Take by mouth. TerraGreen powder 1 scoop mix in water once daily prn when she has not eaten properly.     . NON FORMULARY Take 1 drop by mouth. Lemon oil . Mixed in food or drink once weekly.    . NON FORMULARY Take 1 drop by mouth daily as needed. Grape seed extract ( GSE) Mixed over a salad daily as needed for joint pain relief.      PAST MEDICAL HISTORY: Past Medical History:  Diagnosis Date  . Asthma   . Atrial fibrillation ([place])   . Back pain   . Diastolic dysfunction   . Fibromyalgia   . Gestational diabetes   . Knee pain   . Osteoarthritis   . Persistent atrial fibrillation ([place])   . Sleep apnea   . Visit for monitoring Tikosyn therapy 02/07/2018    PAST SURGICAL HISTORY: Past Surgical History:  Procedure Laterality Date  . ABDOMINAL HYSTERECTOMY    . APPENDECTOMY    . CARDIOVERSION N/A 07/04/2017   Procedure: CARDIOVERSION;  Surgeon: [name], MD;  Location: [facility] ENDOSCOPY;  Service: Cardiovascular;  Laterality: N/A;  . CARDIOVERSION N/A 09/06/2017   Procedure: CARDIOVERSION;  Surgeon: [name], MD;  Location: [facility] ENDOSCOPY;  Service: Cardiovascular;  Laterality: N/A;  . CHOLECYSTECTOMY N/A 02/24/2015   Procedure: LAPAROSCOPIC CHOLECYSTECTOMY;  Surgeon: [name], MD;  Location: [place];  Service: General;  Laterality: N/A;  . RIGHT/LEFT HEART CATH AND CORONARY ANGIOGRAPHY N/A 08/09/2017   Procedure: RIGHT/LEFT HEART CATH AND  CORONARY ANGIOGRAPHY;  Surgeon: [name], MD;  Location: [place] CV LAB;  Service: Cardiovascular;  Laterality: N/A;  . TEE WITHOUT CARDIOVERSION N/A 07/04/2017   Procedure: TRANSESOPHAGEAL ECHOCARDIOGRAM (TEE);  Surgeon: [name], MD;  Location: [facility] ENDOSCOPY;  Service: Cardiovascular;  Laterality: N/A;    SOCIAL HISTORY: Social History   Tobacco Use  . Smoking status: Never Smoker  . Smokeless tobacco: Never Used  Substance Use Topics  . Alcohol use: Yes    Comment: little  . Drug use: No    FAMILY HISTORY: Family History  Problem Relation Age of Onset  . Heart disease Mother   . Hypertension Mother   . Hyperlipidemia Mother   . Diabetes Mother   . Sudden death Mother   . Thyroid disease Mother   . Obesity Mother   . Heart disease Father   . Heart attack Father   . Hypertension Father   . Hyperlipidemia Father   . Diabetes Father     ROS: Review of Systems  Constitutional: Positive for malaise/fatigue. Negative for weight loss.  Gastrointestinal: Negative for nausea and vomiting.  Musculoskeletal:       Negative for muscle weakness    PHYSICAL EXAM: Blood pressure 96/65, pulse 90, temperature 97.9 F (36.6 C), temperature source Oral, height 5\' 5"  (1.651 m), weight 237 lb (107.5 kg), SpO2 95 %. Body mass index is 39.44 kg/m. Physical Exam  Constitutional: She is oriented to person, place, and time. She appears well-developed and well-nourished.  Cardiovascular: Normal rate.  Pulmonary/Chest: Effort normal.  Musculoskeletal: Normal range of motion.  Neurological: She is oriented to person, place, and time.  Skin: Skin is warm and dry.  Psychiatric: She has a normal mood and affect. Her behavior is normal.  Vitals reviewed.   RECENT LABS AND TESTS: BMET    Component Value Date/Time   NA 139 02/08/2018 0359   NA 140 11/02/2017 1254   K 4.1 02/08/2018 0359   CL 103 02/08/2018 0359   CO2 27 02/08/2018 0359   GLUCOSE 115 (H) 02/08/2018  0359   BUN 22 02/08/2018 0359   BUN 13 11/02/2017 1254   CREATININE 0.98 02/08/2018 0359   CALCIUM 9.2 02/08/2018 0359   GFRNONAA >60 02/08/2018 0359   GFRAA >60 02/08/2018 0359   Lab Results  Component Value Date   HGBA1C 5.8 (H) 11/02/2017   HGBA1C 5.8 (H) 06/26/2017   Lab Results  Component Value Date   INSULIN 8.5 11/02/2017   CBC    Component Value Date/Time   WBC 7.4 11/02/2017 1254   WBC 7.4 08/31/2017 0930   RBC 4.49 11/02/2017 1254   RBC 4.63 08/31/2017 0930   HGB 14.1 11/02/2017 1254   HCT 41.6 11/02/2017 1254   PLT 207 08/31/2017 0930   PLT 247 06/24/2017 1046   MCV 93 11/02/2017 1254   MCH 31.4 11/02/2017 1254   MCH 30.2 08/31/2017 0930   MCHC 33.9 11/02/2017 1254   MCHC 33.2 08/31/2017 0930   RDW 14.7 11/02/2017 1254   LYMPHSABS 2.6 11/02/2017 1254   MONOABS 1.2 (H) 02/23/2015 0619   EOSABS 0.1 11/02/2017 1254   BASOSABS 0.0 11/02/2017 1254   Iron/TIBC/Ferritin/ %Sat No results found for: IRON, TIBC, FERRITIN, IRONPCTSAT Lipid Panel     Component Value Date/Time   CHOL 158 11/02/2017 1304   TRIG 151 (H) 11/02/2017 1304   HDL 30 (L) 11/02/2017 1304   CHOLHDL 5.4 06/26/2017 0517   VLDL 15 06/26/2017 0517   LDLCALC 98 11/02/2017 1304   Hepatic Function Panel     Component Value Date/Time   PROT 7.7 11/02/2017 1254   ALBUMIN 4.2 11/02/2017 1254   AST 22 11/02/2017 1254   ALT 18 11/02/2017 1254   ALKPHOS 75 11/02/2017 1254   BILITOT 0.8 11/02/2017 1254      Component Value Date/Time   TSH 3.010 11/02/2017 1254   TSH 4.410 06/24/2017 1046   TSH  1.260 02/22/2015 1954   Results for PREET, MANGANO (MRN [id number]) as of 02/08/2018 08:24  Ref. Range 11/02/2017 12:54  Vitamin D, 25-Hydroxy Latest Ref Range: 30.0 - 100.0 ng/mL 30.9   ASSESSMENT AND PLAN: Chronic atrial fibrillation (HCC)  Vitamin D deficiency  Class 2 severe obesity with serious comorbidity and body mass index (BMI) of 39.0 to 39.9 in adult, unspecified obesity type  [facility])  PLAN:  Atrial Fibrillation We will follow up with [place] after her cardioversion admission.  Vitamin D Deficiency Genelle was informed that low vitamin D levels contributes to fatigue and are associated with obesity, breast, and colon cancer. She agrees to continue to take prescription Vit D @50 ,000 IU every week (no refill needed) and will follow up for routine testing of vitamin D, at least 2-3 times per year. She was informed of the risk of over-replacement of vitamin D and agrees to not increase her dose unless she discusses this with [name] first.  I spent > than 50% of the 15 minute visit on counseling as documented in the note.  Obesity Keidy is currently in the action stage of change. As such, her goal is to continue with weight loss efforts She has agreed to follow the Category 2 plan Kamaiyah has been instructed to work up to a goal of 150 minutes of combined cardio and strengthening exercise per week for weight loss and overall health benefits. We discussed the following Behavioral Modification Strategies today: better snacking choices, planning for success, increasing lean protein intake, increasing vegetables and work on meal planning and easy cooking plans  Dotty has agreed to follow up with our clinic in 2 weeks. She was informed of the importance of frequent follow up visits to maximize her success with intensive lifestyle modifications for her multiple health conditions.   OBESITY BEHAVIORAL INTERVENTION VISIT  Today's visit was # 5   Starting weight: 249 lbs Starting date: 11/02/17 Today's weight : 237 lbs  Today's date: 02/07/2018 Total lbs lost to date: 12   ASK: We discussed the diagnosis of obesity with [name] today and Evaline agreed to give [name] permission to discuss obesity behavioral modification therapy today.  ASSESS: Aryssa has the diagnosis of obesity and her BMI today is 39.44 Shirlene is in the action stage of change    ADVISE: Kaiulani was educated on the multiple health risks of obesity as well as the benefit of weight loss to improve her health. She was advised of the need for long term treatment and the importance of lifestyle modifications to improve her current health and to decrease her risk of future health problems.  AGREE: Multiple dietary modification options and treatment options were discussed and  Lerae agreed to follow the recommendations documented in the above note.  ARRANGE: Imanii was educated on the importance of frequent visits to treat obesity as outlined per CMS and USPSTF guidelines and agreed to schedule her next follow up appointment today.  I, Doreene Nest, am acting as transcriptionist for Eber Jones, MD  I have reviewed the above documentation for accuracy and completeness, and I agree with the above. - Ilene Qua, MD

## 2018-02-09 ENCOUNTER — Ambulatory Visit (HOSPITAL_COMMUNITY): Admit: 2018-02-09 | Payer: Medicare Other | Admitting: Cardiology

## 2018-02-09 ENCOUNTER — Encounter (HOSPITAL_COMMUNITY)
Admission: AD | Disposition: A | Discharge status: Home / Self Care | Payer: Self-pay | Source: Ambulatory Visit | Attending: Internal Medicine

## 2018-02-09 LAB — BASIC METABOLIC PANEL
Anion gap: 9 (ref 5–15)
BUN: 24 mg/dL — ABNORMAL HIGH (ref 8–23)
CALCIUM: 9 mg/dL (ref 8.9–10.3)
CO2: 24 mmol/L (ref 22–32)
CREATININE: 0.96 mg/dL (ref 0.44–1.00)
Chloride: 106 mmol/L (ref 98–111)
GFR calc Af Amer: >60 mL/min (ref >60)
GLUCOSE: 110 mg/dL — AB (ref 70–99)
Potassium: 4 mmol/L (ref 3.5–5.1)
Sodium: 139 mmol/L (ref 135–145)

## 2018-02-09 LAB — MAGNESIUM: MAGNESIUM: 2.5 mg/dL — AB (ref 1.7–2.4)

## 2018-02-09 SURGERY — CARDIOVERSION
Anesthesia: General

## 2018-02-09 MED ORDER — DOFETILIDE 250 MCG PO CAPS
250.0000 ug | ORAL_CAPSULE | Freq: Two times a day (BID) | ORAL | Status: DC
  Administered 2018-02-09 (×2): 250 ug via ORAL
  Filled 2018-02-09 (×2): qty 1

## 2018-02-09 NOTE — Care Management Note (Signed)
Case Management Note  Patient Details  Name: Christie Atkinson MRN: [id number] Date of Birth: 08-25-1955  Subjective/Objective:  Pt presented for Tikosyn Load-Persistent Atrial Fib. PTA Independent from home. Benefits Check completed and patient is aware of cost. Pt will utilize [employer] for [employer].                 Action/Plan: CM will assist with Rx for 7 day supply no refills and then will need a Rx for 30 day supply to get Rx Retail at [facility]. Patient eventually wants to get a 90 day supply for Tikosyn Brand via [employer]. Brand will need Prior authorization @ # [id number] for exception. No further needs from CM at this time.    Expected Discharge Date:                  Expected Discharge Plan:  Home/Self Care  In-House Referral:  NA  Discharge planning Services  CM Consult, Medication Assistance  Post Acute Care Choice:  NA Choice offered to:  NA  DME Arranged:  N/A DME Agency:  NA  HH Arranged:  NA HH Agency:  NA  Status of Service:  Completed, signed off  If discussed at [place] of Stay Meetings, dates discussed:    Additional Comments:  Bethena Roys, RN 02/09/2018, 12:30 PM

## 2018-02-09 NOTE — Progress Notes (Signed)
Patient in normal sinus rhythm, EKG confirmed.

## 2018-02-09 NOTE — Progress Notes (Signed)
QTC 480

## 2018-02-09 NOTE — Progress Notes (Addendum)
Progress Note  Patient Name: Christie Atkinson Date of Encounter: 02/09/2018  Primary Cardiologist: Sinclair Grooms, MD   Subjective   Feels well, slept well.  Has had a couple "hot flashes" and wonders of it could be the drug, though has been having hot flashes for years.  Very happy to be back in normal rhythm  Inpatient Medications    Scheduled Meds: . apixaban  5 mg Oral BID  . diltiazem  120 mg Oral BID  . dofetilide  500 mcg Oral BID  . furosemide  40 mg Oral Daily  . magnesium oxide  400 mg Oral Daily  . multivitamin with minerals  1 tablet Oral Daily  . potassium chloride SA  20 mEq Oral Daily  . sodium chloride flush  3 mL Intravenous Q12H  . sodium chloride flush  3 mL Intravenous Q12H   Continuous Infusions: . sodium chloride    . sodium chloride     PRN Meds: sodium chloride, acetaminophen, hydrocortisone cream, sodium chloride flush, sodium chloride flush   Vital Signs    Vitals:   02/08/18 1300 02/08/18 2030 02/09/18 0357 02/09/18 0402  BP: 97/60 110/75  112/83  Pulse: 86 80  64  Resp: 18   14  Temp: 97.9 F (36.6 C) 97.7 F (36.5 C)  97.6 F (36.4 C)  TempSrc: Oral Oral  Oral  SpO2: 98% 94%  96%  Weight:   104.6 kg   Height:        Intake/Output Summary (Last 24 hours) at 02/09/2018 0934 Last data filed at 02/09/2018 0300 Gross per 24 hour  Intake 723 ml  Output 1100 ml  Net -377 ml   Filed Weights   02/07/18 1123 02/08/18 0643 02/09/18 0357  Weight: 109.4 kg 107.1 kg 104.6 kg    Telemetry    AFib 80's generally, fater with exertion - Personally Reviewed  ECG    SR 62bpm this morning, manually measured QT 500-520 - Personally Reviewed  Physical Exam    GEN: No acute distress.   Neck: No JVD Cardiac: RRR, no murmurs, rubs, or gallops.  Respiratory: CTA b/l. GI: Soft, nontender, non-distended  MS: No edema; No deformity. Neuro:  Nonfocal  Psych: Normal affect   Labs    Chemistry Recent Labs  Lab 02/07/18 1028  02/08/18 0359 02/09/18 0330  NA 140 139 139  K 4.9 4.1 4.0  CL 109 103 106  CO2 22 27 24   GLUCOSE 123* 115* 110*  BUN 17 22 24*  CREATININE 0.91 0.98 0.96  CALCIUM 9.2 9.2 9.0  GFRNONAA >60 >60 >60  GFRAA >60 >60 >60  ANIONGAP 9 9 9      HematologyNo results for input(s): WBC, RBC, HGB, HCT, MCV, MCH, MCHC, RDW, PLT in the last 168 hours.  Cardiac EnzymesNo results for input(s): TROPONINI in the last 168 hours. No results for input(s): TROPIPOC in the last 168 hours.   BNPNo results for input(s): BNP, PROBNP in the last 168 hours.   DDimer No results for input(s): DDIMER in the last 168 hours.   Radiology    No results found.  Cardiac Studies   08/09/17: LHC  Normal left main.  Normal left anterior descending coronary artery.  Normal circumflex coronary artery.  Normal dominant RCA.  Normal LV size with normal hemodynamics and estimated ejection fraction of 50%.  False positive myocardial perfusion imaging.  Mild pulmonary hypertension with mean pressure of 27 mmHg.  RECOMMENDATIONS:  Management of atrial fibrillation with  aggressive rate control and stroke prophylaxis.  07/04/17: TEE Study Conclusions - Left ventricle: Systolic function was normal. The estimated ejection fraction was in the range of 50% to 55%. No evidence of thrombus. - Ventricular septum: The contour showed a normal configuration. There was no evidence of a ventricular septal defect. - Mitral valve: There was mild regurgitation. - Left atrium: No formed thrombus. Dense spontaneous contrast No evidence of thrombus in the atrial cavity or appendage. - Right ventricle: The cavity size was mildly dilated. - Atrial septum: There was a patent foramen ovale. - Impressions: Advanced 3D rendering of the atrial septum was performed. [place] x 1 150 Joules Converted from afib rate 110 to NSR rate 80&'s No immediate neurologic sequela Impressions: - Advanced 3D rendering of the atrial  septum was performed. [place] x 1 150 Joules Converted from afib rate 110 to NSR rate 80&'s No immediate neurologic sequela Successful cardioversion. No cardiac source of emboli was indentified.  06/27/17: TTE Study Conclusions - Left ventricle: The cavity size was normal. Wall thickness was increased in a pattern of mild LVH. Systolic function was normal. The estimated ejection fraction was in the range of 60% to 65%. Wall motion was normal; there were no regional wall motion abnormalities. The study is not technically sufficient to allow evaluation of LV diastolic function. - Mitral valve: Calcified annulus. Mildly thickened leaflets . There was mild regurgitation. Valve area by continuity equation (using LVOT flow): 2.07 cm^2. - Left atrium: The atrium was mildly dilated. (60mm) - Atrial septum: No defect or patent foramen ovale was identified. - Pulmonary arteries: PA peak pressure: 31 mm Hg (S).  Patient Profile     62 y.o. female with a history of prior AF with gall bladder surgery in 2016, CHF, obesity, untreated sleep apnea, that was hospitalized 1/19 to 1/23 for afib with RVR >> persistent AFib, admitted for Tikosyn initiation  Assessment & Plan    1. Persistent AFib, here for Tikosyn load     CHA2DS2Vasc is one, on Eliquis     K+ 4.0     Mag 2.5     Creat 0.96     QT appears slightly long to me, I will review with [name] prior to AM dose or making any adjustment   2. Hx of using numerous OTC supplements     Appreciate pharmacy evaluation     Current supplements ok    For questions or updates, please contact [facility] Please consult [email or web address] for contact info under Cardiology/STEMI.      Signed, Baldwin Jamaica, PA-C  02/09/2018, 9:34 AM    I have seen, examined the patient, and reviewed the above assessment and plan.  Changes to above are made where necessary.  On exam, RRR.  She has converted to sinus rhythm.  Qtc is 510  msec. I will therefore reduce tikosyn dose to 250 mcg BID at this time.  Co Sign: [name], MD 02/09/2018 10:56 AM

## 2018-02-10 LAB — BASIC METABOLIC PANEL
ANION GAP: 7 (ref 5–15)
BUN: 21 mg/dL (ref 8–23)
CO2: 24 mmol/L (ref 22–32)
Calcium: 8.9 mg/dL (ref 8.9–10.3)
Chloride: 108 mmol/L (ref 98–111)
Creatinine, Ser: 0.85 mg/dL (ref 0.44–1.00)
GFR calc Af Amer: >60 mL/min (ref >60)
GLUCOSE: 100 mg/dL — AB (ref 70–99)
POTASSIUM: 4.1 mmol/L (ref 3.5–5.1)
Sodium: 139 mmol/L (ref 135–145)

## 2018-02-10 LAB — MAGNESIUM: Magnesium: 2.4 mg/dL (ref 1.7–2.4)

## 2018-02-10 MED ORDER — DOFETILIDE 125 MCG PO CAPS
125.0000 ug | ORAL_CAPSULE | Freq: Two times a day (BID) | ORAL | Status: DC
  Administered 2018-02-10 – 2018-02-11 (×3): 125 ug via ORAL
  Filled 2018-02-10: qty 14
  Filled 2018-02-10 (×3): qty 1

## 2018-02-10 NOTE — Progress Notes (Signed)
QTC 515 three hours after taking Tikosyn.

## 2018-02-10 NOTE — Progress Notes (Addendum)
Electrophysiology Rounding Note  Patient Name: Christie Atkinson Date of Encounter: 02/10/2018  Primary Cardiologist: Tamala Julian Electrophysiologist: Devonta Blanford   Subjective   The patient is doing well today.  At this time, the patient denies chest pain, shortness of breath, or any new concerns.   Inpatient Medications    Scheduled Meds: . apixaban  5 mg Oral BID  . diltiazem  120 mg Oral BID  . dofetilide  125 mcg Oral BID  . furosemide  40 mg Oral Daily  . magnesium oxide  400 mg Oral Daily  . multivitamin with minerals  1 tablet Oral Daily  . potassium chloride SA  20 mEq Oral Daily  . sodium chloride flush  3 mL Intravenous Q12H  . sodium chloride flush  3 mL Intravenous Q12H   Continuous Infusions: . sodium chloride    . sodium chloride     PRN Meds: sodium chloride, acetaminophen, hydrocortisone cream, sodium chloride flush, sodium chloride flush   Vital Signs    Vitals:   02/09/18 1001 02/09/18 1403 02/09/18 2053 02/10/18 0631  BP: 106/63 (!) 105/55 119/71 117/70  Pulse:  62 (!) 57 (!) 58  Resp: 15  14 (!) 22  Temp: (!) 97.5 F (36.4 C) 97.9 F (36.6 C) (!) 97.5 F (36.4 C) 97.9 F (36.6 C)  TempSrc: Oral Oral Oral Oral  SpO2: 95% 94% 97% 99%  Weight:    108.2 kg  Height:        Intake/Output Summary (Last 24 hours) at 02/10/2018 0927 Last data filed at 02/10/2018 0904 Gross per 24 hour  Intake 600 ml  Output 900 ml  Net -300 ml   Filed Weights   02/08/18 0643 02/09/18 0357 02/10/18 0631  Weight: 107.1 kg 104.6 kg 108.2 kg    Physical Exam    GEN- The patient is obese appearing, alert and oriented x 3 today.   Head- normocephalic, atraumatic Eyes-  Sclera clear, conjunctiva pink Ears- hearing intact Oropharynx- clear Neck- supple Lungs- Clear to ausculation bilaterally, normal work of breathing Heart- Regular rate and rhythm GI- soft, NT, ND, + BS Extremities- no clubbing, cyanosis, or edema Skin- no rash or lesion Psych- euthymic mood, full  affect Neuro- strength and sensation are intact  Labs    Basic Metabolic Panel Recent Labs    02/09/18 0330 02/10/18 0551  NA 139 139  K 4.0 4.1  CL 106 108  CO2 24 24  GLUCOSE 110* 100*  BUN 24* 21  CREATININE 0.96 0.85  CALCIUM 9.0 8.9  MG 2.5* 2.4     Telemetry    SR (personally reviewed)  Radiology    No results found.   Patient Profile     Christie Atkinson is a 62 y.o. female admitted for Tikosyn load  Assessment & Plan    1.  Persistent atrial fibrillation Maintaining SR on Tikosyn QTc a little long again today - will decrease Tikosyn to 131mcg twice daily and watch today Hopefully home tomorrow Labs stable Continue Eliquis    For questions or updates, please contact [place] Please consult [email or web address] for contact info under Cardiology/STEMI.  Signed, Chanetta Marshall, NP  02/10/2018, 9:27 AM    I have seen, examined the patient, and reviewed the above assessment and plan.  Changes to above are made where necessary.  On exam, RRR.  Qt is about 510 msec today.  Reduce tikosyn to 125mg  BID.  Anticipate discharge to home tomorrow if QT <500 msec with follow-up next week  in AF clinic.  Co Sign: [name], MD 02/10/2018 2:29 PM

## 2018-02-10 NOTE — Care Management Important Message (Signed)
Important Message  Patient Details  Name: Christie Atkinson MRN: [id number] Date of Birth: 1955-10-28   Medicare Important Message Given:  Yes    Barb Merino Reigan Tolliver 02/10/2018, 3:39 PM

## 2018-02-10 NOTE — Discharge Instructions (Signed)
You have an appointment set up with the [facility].  Multiple studies have shown that being followed by a dedicated atrial fibrillation clinic in addition to the standard care you receive from your other physicians improves health. We believe that enrollment in the atrial fibrillation clinic will allow [name] to better care for you.   The phone number to the [facility] is [phone]. The clinic is staffed Monday through Friday from 8:30am to 5pm.  Parking Directions: The clinic is located in the Heart and Vascular Building connected to [facility]. 1)From [address] turn on to [address] and go to the 3rd entrance  (Heart and Vascular entrance) on the right. 2)Look to the right for Heart &Vascular Parking Garage. 3)A code for the entrance is required please call the clinic to receive this.   4)Take the elevators to the 1st floor. Registration is in the room with the glass walls at the end of the hallway.  If you have any trouble parking or locating the clinic, please dont hesitate to call [phone].  Information on my medicine - ELIQUIS (apixaban)  This medication education was reviewed with me or my healthcare representative as part of my discharge preparation.    Why was Eliquis prescribed for you? You were taking this medication prior to this hospital admission. Eliquis was prescribed for you to reduce the risk of a blood clot forming that can cause a stroke if you have a medical condition called atrial fibrillation (a type of irregular heartbeat).  What do You need to know about Eliquis ? Take your Eliquis TWICE DAILY - one tablet in the morning and one tablet in the evening with or without food. If you have difficulty swallowing the tablet whole please discuss with your pharmacist how to take the medication safely.  Take Eliquis exactly as prescribed by your doctor and DO NOT stop taking Eliquis without talking to the doctor who  prescribed the medication.  Stopping may increase your risk of developing a stroke.  Refill your prescription before you run out.  After discharge, you should have regular check-up appointments with your healthcare provider that is prescribing your Eliquis.  In the future your dose may need to be changed if your kidney function or weight changes by a significant amount or as you get older.  What do you do if you miss a dose? If you miss a dose, take it as soon as you remember on the same day and resume taking twice daily.  Do not take more than one dose of ELIQUIS at the same time to make up a missed dose.  Important Safety Information A possible side effect of Eliquis is bleeding. You should call your healthcare provider right away if you experience any of the following: ? Bleeding from an injury or your nose that does not stop. ? Unusual colored urine (red or dark brown) or unusual colored stools (red or black). ? Unusual bruising for unknown reasons. ? A serious fall or if you hit your head (even if there is no bleeding).  Some medicines may interact with Eliquis and might increase your risk of bleeding or clotting while on Eliquis. To help avoid this, consult your healthcare provider or pharmacist prior to using any new prescription or non-prescription medications, including herbals, vitamins, non-steroidal anti-inflammatory drugs (NSAIDs) and supplements.  This website has more information on Eliquis (apixaban): http://www.eliquis.com/eliquis/home

## 2018-02-11 DIAGNOSIS — Z79899 Other long term (current) drug therapy: Secondary | ICD-10-CM

## 2018-02-11 DIAGNOSIS — I5032 Chronic diastolic (congestive) heart failure: Secondary | ICD-10-CM

## 2018-02-11 DIAGNOSIS — Z5181 Encounter for therapeutic drug level monitoring: Secondary | ICD-10-CM

## 2018-02-11 HISTORY — DX: Chronic diastolic (congestive) heart failure: I50.32

## 2018-02-11 MED ORDER — DOFETILIDE 125 MCG PO CAPS: 125.0000 ug | ORAL_CAPSULE | Freq: Two times a day (BID) | ORAL | 0 refills | Status: DC

## 2018-02-11 MED ORDER — FUROSEMIDE 40 MG PO TABS: 40.0000 mg | ORAL_TABLET | Freq: Every day | ORAL | Status: DC

## 2018-02-11 MED ORDER — MAGNESIUM OXIDE 400 (241.3 MG) MG PO TABS: 400.0000 mg | ORAL_TABLET | Freq: Every day | ORAL | 6 refills | Status: DC

## 2018-02-11 MED ORDER — POTASSIUM CHLORIDE CRYS ER 20 MEQ PO TBCR: 20.0000 meq | EXTENDED_RELEASE_TABLET | Freq: Every day | ORAL | Status: DC

## 2018-02-11 NOTE — Progress Notes (Signed)
Progress Note  Patient Name: Christie Atkinson Date of Encounter: 02/11/2018  Primary Cardiologist: [name] III, MD   Subjective   No chest pain or sob.   Inpatient Medications    Scheduled Meds: . apixaban  5 mg Oral BID  . diltiazem  120 mg Oral BID  . dofetilide  125 mcg Oral BID  . furosemide  40 mg Oral Daily  . magnesium oxide  400 mg Oral Daily  . multivitamin with minerals  1 tablet Oral Daily  . potassium chloride SA  20 mEq Oral Daily  . sodium chloride flush  3 mL Intravenous Q12H  . sodium chloride flush  3 mL Intravenous Q12H   Continuous Infusions: . sodium chloride    . sodium chloride     PRN Meds: sodium chloride, acetaminophen, hydrocortisone cream, sodium chloride flush, sodium chloride flush   Vital Signs    Vitals:   02/10/18 2209 02/11/18 0531 02/11/18 0533 02/11/18 0810  BP: 121/67 (!) 109/58  (!) 107/54  Pulse: (!) 57 (!) 58    Resp: 18 19    Temp: 97.7 F (36.5 C) 98 F (36.7 C)    TempSrc: Oral Oral    SpO2: 98% 99%    Weight:   108.7 kg   Height:        Intake/Output Summary (Last 24 hours) at 02/11/2018 0851 Last data filed at 02/11/2018 0813 Gross per 24 hour  Intake 483 ml  Output 1 ml  Net 482 ml   Filed Weights   02/09/18 0357 02/10/18 0631 02/11/18 0533  Weight: 104.6 kg 108.2 kg 108.7 kg    Telemetry    nsr - Personally Reviewed  ECG    nsr with QTC around 470 from last night by my calc - Personally Reviewed  Physical Exam   GEN: No acute distress.   Neck: 6 cm JVD Cardiac: RRR, no murmurs, rubs, or gallops.  Respiratory: Clear to auscultation bilaterally. GI: Soft, nontender, non-distended  MS: No edema; No deformity. Neuro:  Nonfocal  Psych: Normal affect   Labs    Chemistry Recent Labs  Lab 02/08/18 0359 02/09/18 0330 02/10/18 0551  NA 139 139 139  K 4.1 4.0 4.1  CL 103 106 108  CO2 27 24 24   GLUCOSE 115* 110* 100*  BUN 22 24* 21  CREATININE 0.98 0.96 0.85  CALCIUM 9.2 9.0 8.9    GFRNONAA >60 >60 >60  GFRAA >60 >60 >60  ANIONGAP 9 9 7      HematologyNo results for input(s): WBC, RBC, HGB, HCT, MCV, MCH, MCHC, RDW, PLT in the last 168 hours.  Cardiac EnzymesNo results for input(s): TROPONINI in the last 168 hours. No results for input(s): TROPIPOC in the last 168 hours.   BNPNo results for input(s): BNP, PROBNP in the last 168 hours.   DDimer No results for input(s): DDIMER in the last 168 hours.   Radiology    No results found.  Cardiac Studies   none  Patient Profile     62 y.o. female admitted for treatment of atrial fib, started on dofetilide. QT out too far and dose adjusted. Now QT better. If ok after ECG from this a.m she can be discharged.  Assessment & Plan    1. Atrial fib - she is maintaining NSR. She will continue dofetilide and dc later today. 2. Obesity - she needs to lose weight.   For questions or updates, please contact [place] Please consult [email or web address] for contact  info under Cardiology/STEMI.      Signed, Cristopher Peru, MD  02/11/2018, 8:51 AM  Patient ID: Christie Atkinson, female   DOB: [birth year], 62 y.o.   MRN: [id number]

## 2018-02-11 NOTE — Discharge Summary (Addendum)
Discharge Summary    Patient ID: Christie Atkinson,  MRN: [id number], DOB/AGE: [birth year] 2 y.o.  Admit date: 02/07/2018 Discharge date: 02/11/2018  Primary Care Provider: Patient, No Pcp Per Primary Cardiologist: Sinclair Grooms, MD Primary Electrophysiologist:  [name], MD  Discharge Diagnoses    Principal Problem:   Persistent atrial fibrillation [facility]) Active Problems:   Visit for monitoring Tikosyn therapy   Chronic diastolic CHF (congestive heart failure) ([place])    Diagnostic Studies/Procedures   N/A _____________     History of Present Illness     Christie Atkinson is a 62 y.o. female with history of paroxysmal atrial fibrillation, chronic diastolic CHF, fibromyalgia, obesity, untreated OSA who presented to [facility] for planned Tiksoyn load.  Hospital Course    She had remote AF with gall bladder surgery in 2016. She was discharged on oral diltiazem and ASA 81. She was not evaluated by cardiology again until 06/2017, when she was seen in clinic and reported one month of exertional dyspnea and palpitations. At that visit, she was in AF with HR 130s with elevated BP. She reported at that visit that she never started her diltiazem. At that visit, her diltiazem was restarted and apixaban was initiated with plans for outpatient follow up for possible DCCV. Before that could be completed, she was admitted to the hospital with probable acute diastolic CHF In the setting of rapid atrial fibrillation. 2D echo showed normal LVEF. She underwent TEE/DCCV 07/04/17 which was initially successful but then had early return of atrial fibrillation in 07/2017. She underwent nuclear stress test in anticipation of starting flecainide but this returned abnormal so underwent cath which showed normal coronaries, EF 50%, mild pulmonary HTN. In 09/2017 she was loaded on flecainide 100 mg bid and went on to have successful cardioversion but returned in afib soon after. Diltiazem was further adjusted. Atrial  flutter is also mentioned in notes. She returned back this summer to discuss options. She has also been following with Dr. Claiborne Billings for sleep apnea. She initially wanted to defer Tikosyn but then opted to proceed.    She was educated in the afib clinic about [place] and also provided education in the hospital. She was initiated on 593mcg BID but dose was reduced to 148mcg BID by day of discharge due to borderline QT. She did spontaneously convert to NSR. Her QTc is 466 this morning on the lower dose and she is feeling well. DC K is 4.1, Mg is 2.4. Dr. Lovena Le has seen and examined the patient today and feels she is stable for discharge. She has f/u in the afib clinic arranged 9/13 at [facility], and with [name] 10/2. She received 7 day rx to fill from [facility] and then sent in Rx for 30 day supply to [facility] at [facility]. Per Care Management note, ""Patient eventually wants to get a 90 day supply for Tikosyn [facility] via [facility]. Brand will need Prior authorization @ # [id number] for exception." I was unable to complete this over the weekend so this will need attention on follow-up by atrial fib clinic. Will cc chart to Donna/afib clinic nurse to be aware.   Regarding other med rec issues: - Of note, prior to admission she was taking numerous OTC supplements in the past (this is well outlined in notes). She received education from pharmacist and care team to cease these prior to Tikosyn initiation as well as after. It looks like pharmacy did review the hyaluronic acid she uses (supplement and  injection) as an outpatient and felt this was safe to take. I did confirm this with the inpatient pharmacy here. - OTC mag was changed to MagOx 400mg  daily for better ease of adjustment if needed. - Her old Lasix prescription said take 40mg  daily PRN. She states per prior instruction she's been regularly taking 40mg  daily with intermittent extra 1/2 tablet PRN excess fluid. She was taking Lasix 40mg  daily as inpatient here.  Given her Tikosyn initiation, it will be important to maintain good potassium level so I told her on days when she takes the extra 1/2 tablet of Lasix, to take an extra 1/2 tablet of potassium. I've updated her med rec to reflect this, but did not send in repeat prescriptions as it appears she uses various pharmacies for each. If her labs are stable on this regimen at atrial fib clinic f/u, recommend the rx's be formally updated and sent to her desired final pharmacy.  _____________  Discharge Vitals Blood pressure (!) 107/54, pulse (!) 58, temperature 98 F (36.7 C), temperature source Oral, resp. rate 19, height 5\' 5"  (1.651 m), weight 108.7 kg, SpO2 99 %.  Filed Weights   02/09/18 0357 02/10/18 0631 02/11/18 0533  Weight: 104.6 kg 108.2 kg 108.7 kg    Labs & Radiologic Studies    CBC No results for input(s): WBC, NEUTROABS, HGB, HCT, MCV, PLT in the last 72 hours. Basic Metabolic Panel Recent Labs    02/09/18 0330 02/10/18 0551  NA 139 139  K 4.0 4.1  CL 106 108  CO2 24 24  GLUCOSE 110* 100*  BUN 24* 21  CREATININE 0.96 0.85  CALCIUM 9.0 8.9  MG 2.5* 2.4    No results found. Disposition   Pt is being discharged home today in good condition.  Follow-up Plans & Appointments    Follow-up Information    [facility] Follow up on 02/17/2018.   Specialty:  Cardiology Why:  9:00AM Contact information: [address] [id number] [place] [phone]       [name], MD Follow up on 03/08/2018.   Specialty:  Cardiology Why:  1:45PM Contact information: [place] [address] [phone]          Discharge Instructions    Diet - low sodium heart healthy   Complete by:  As directed    Discharge instructions   Complete by:  As directed    Do not take any new medicines or supplements without consulting your cardiologist or pharmacist, since you are on Tikosyn. The  non-pharmaceutical supplements on your list were discontinued.  Your magnesium supplement was changed to MagOx.  Please see instructions on furosemide/Lasix and potassium.  At your follow-up appointment, if your labwork is stable, please request that [facility] Penny send in your prescriptions for Tikosyn, furosemide and potassium to your mail order.   Increase activity slowly   Complete by:  As directed       Discharge Medications   Allergies as of 02/11/2018      Reactions   Other Other (See Comments)   Metals Polyester - including hospital gowns and sheets - allergic contact dermatitis   Sulfur Hives      Medication List    STOP taking these medications   Magnesium 250 MG Tabs Replaced by:  magnesium oxide 400 (241.3 Mg) MG tablet   NON FORMULARY   NON FORMULARY   NON FORMULARY   NON FORMULARY   NON FORMULARY  NON FORMULARY   NON FORMULARY     TAKE these medications   acetaminophen 325 MG tablet Commonly known as:  TYLENOL Take 325 mg by mouth every 6 (six) hours as needed for moderate pain or headache.   apixaban 5 MG Tabs tablet Commonly known as:  ELIQUIS Take 1 tablet (5 mg total) by mouth 2 (two) times daily.   diltiazem 120 MG 24 hr capsule Commonly known as:  CARDIZEM CD Take 1 capsule (120 mg total) by mouth 2 (two) times daily.   dofetilide 125 MCG capsule Commonly known as:  TIKOSYN Take 1 capsule (125 mcg total) by mouth 2 (two) times daily.   furosemide 40 MG tablet Commonly known as:  LASIX Take 1 tablet (40 mg total) by mouth daily. May take an additional 1/2 tablet (20mg ) daily if needed for fluid retention. What changed:    when to take this  reasons to take this  additional instructions   Hyaluronic Acid 20 MG/ML Gel Inject 20 mg as directed every 6 (six) months.   Hyaluronic Acid 20-60 MG Caps Take 20 mg by mouth daily.   magnesium oxide 400 (241.3 Mg) MG tablet Commonly known as:  MAG-OX Take 1 tablet (400 mg total) by mouth  daily. Start taking on:  02/12/2018 Replaces:  Magnesium 250 MG Tabs   multivitamin with minerals Tabs tablet Take 1 tablet by mouth daily.   polyethylene glycol powder powder Commonly known as:  GLYCOLAX/MIRALAX Take 17 g by mouth daily.   potassium chloride SA 20 MEQ tablet Commonly known as:  K-DUR,KLOR-CON Take 1 tablet (20 mEq total) by mouth daily. Take an extra 1/2 tablet by mouth on days when you take the extra fluid pill (furosemide/Lasix). What changed:  additional instructions   Vitamin D (Ergocalciferol) 50000 units Caps capsule Commonly known as:  DRISDOL Take 1 capsule (50,000 Units total) by mouth every 7 (seven) days.        Allergies:  Allergies  Allergen Reactions  . Other Other (See Comments)    Metals Polyester - including hospital gowns and sheets - allergic contact dermatitis  . Sulfur Hives      Outstanding Labs/Studies   Labs @ afib clinic f/u  Duration of Discharge Encounter   Greater than 30 minutes including physician time.  Signed, Charlie Pitter PA-C 02/11/2018, 12:07 PM   EP Attending  Patient seen and examined. Agree with above. The patient's QT is ok on dofetilide.   Mikle Bosworth.D.

## 2018-02-11 NOTE — Progress Notes (Signed)
Original tikosyn week script lost in tube system.  I re-wrote and RN personally took it down.

## 2018-02-11 NOTE — Progress Notes (Signed)
QTC 480 before Tikosyn; 495 after receiving Tikosyn.

## 2018-02-13 ENCOUNTER — Other Ambulatory Visit (HOSPITAL_COMMUNITY): Payer: Self-pay | Admitting: *Deleted

## 2018-02-13 MED ORDER — DOFETILIDE 125 MCG PO CAPS: 125.0000 ug | ORAL_CAPSULE | Freq: Two times a day (BID) | ORAL | 2 refills | Status: DC

## 2018-02-14 ENCOUNTER — Other Ambulatory Visit (INDEPENDENT_AMBULATORY_CARE_PROVIDER_SITE_OTHER): Payer: Self-pay | Admitting: Family Medicine

## 2018-02-14 DIAGNOSIS — E559 Vitamin D deficiency, unspecified: Secondary | ICD-10-CM

## 2018-02-17 ENCOUNTER — Ambulatory Visit (HOSPITAL_COMMUNITY): Payer: Medicare Other | Admitting: Nurse Practitioner

## 2018-02-21 ENCOUNTER — Encounter (HOSPITAL_COMMUNITY): Payer: Self-pay | Admitting: Nurse Practitioner

## 2018-02-21 ENCOUNTER — Ambulatory Visit (INDEPENDENT_AMBULATORY_CARE_PROVIDER_SITE_OTHER): Payer: Medicare Other | Admitting: Family Medicine

## 2018-02-21 ENCOUNTER — Ambulatory Visit (HOSPITAL_COMMUNITY)
Admission: RE | Admit: 2018-02-21 | Discharge: 2018-02-21 | Disposition: A | Discharge status: Home / Self Care | Payer: Medicare Other | Source: Ambulatory Visit | Attending: Nurse Practitioner | Admitting: Nurse Practitioner

## 2018-02-21 ENCOUNTER — Telehealth: Payer: Self-pay | Admitting: Internal Medicine

## 2018-02-21 VITALS — BP 94/94 | HR 65 | Temp 97.4°F | Ht 65.0 in | Wt 236.0 lb

## 2018-02-21 VITALS — BP 122/74 | HR 112 | Ht 65.0 in | Wt 240.6 lb

## 2018-02-21 DIAGNOSIS — J45909 Unspecified asthma, uncomplicated: Secondary | ICD-10-CM | POA: Insufficient documentation

## 2018-02-21 DIAGNOSIS — Z6839 Body mass index (BMI) 39.0-39.9, adult: Secondary | ICD-10-CM | POA: Diagnosis not present

## 2018-02-21 DIAGNOSIS — G4730 Sleep apnea, unspecified: Secondary | ICD-10-CM | POA: Insufficient documentation

## 2018-02-21 DIAGNOSIS — I4892 Unspecified atrial flutter: Secondary | ICD-10-CM

## 2018-02-21 DIAGNOSIS — Z79899 Other long term (current) drug therapy: Secondary | ICD-10-CM | POA: Diagnosis not present

## 2018-02-21 DIAGNOSIS — M1990 Unspecified osteoarthritis, unspecified site: Secondary | ICD-10-CM | POA: Diagnosis not present

## 2018-02-21 DIAGNOSIS — I481 Persistent atrial fibrillation: Secondary | ICD-10-CM | POA: Diagnosis not present

## 2018-02-21 DIAGNOSIS — I482 Chronic atrial fibrillation, unspecified: Secondary | ICD-10-CM

## 2018-02-21 DIAGNOSIS — Z7901 Long term (current) use of anticoagulants: Secondary | ICD-10-CM | POA: Insufficient documentation

## 2018-02-21 DIAGNOSIS — E559 Vitamin D deficiency, unspecified: Secondary | ICD-10-CM

## 2018-02-21 DIAGNOSIS — M797 Fibromyalgia: Secondary | ICD-10-CM | POA: Insufficient documentation

## 2018-02-21 LAB — BASIC METABOLIC PANEL
Anion gap: 6 (ref 5–15)
BUN: 23 mg/dL (ref 8–23)
CO2: 26 mmol/L (ref 22–32)
CREATININE: 0.89 mg/dL (ref 0.44–1.00)
Calcium: 9.2 mg/dL (ref 8.9–10.3)
Chloride: 106 mmol/L (ref 98–111)
GFR calc non Af Amer: >60 mL/min (ref >60)
Glucose, Bld: 116 mg/dL — ABNORMAL HIGH (ref 70–99)
POTASSIUM: 4 mmol/L (ref 3.5–5.1)
SODIUM: 138 mmol/L (ref 135–145)

## 2018-02-21 LAB — MAGNESIUM: MAGNESIUM: 2.3 mg/dL (ref 1.7–2.4)

## 2018-02-21 NOTE — Telephone Encounter (Signed)
Returned a call to the patient. Informed her that I sent the referral over on 01/19/18. Dr Kae Heller office claims they never received it (even though I watched it send.) I re faxed the referral along with sleep study and notes. The receptionist states they will contact the patient either today or tomorrow. Patient notified.she then tells me that she may have another MD she would prefer to use. She is going to the "[place]" on Thursday and will be finding out if they will pay for or help pay for the oral device if she uses their dentist. I told the patient to just call me back if she decides to use a different provider and I will be happy to forward the referral.

## 2018-02-21 NOTE — Telephone Encounter (Signed)
New Message:      Patient is calling for the status of a mouth guard for sleep apnea

## 2018-02-21 NOTE — Progress Notes (Signed)
Primary Care Physician: Patient, No Pcp Per Referring Physician: [facility] f/u Cardiologist: Dr. Linard Millers EP: Dr. [name] is a 62 y.o. female with a h/o prior AF with gall bladder surgery in 2016,  CHF, obesity, untreated sleep apnea, that was hospitalized 1/19 to 1/23  for afib with RVR.  She was admitted to cardiology with acute on chronic congestive heart failure, type unknown, and atrial fibrillation with RVR, likely secondary to heart failure exacerbation.   No ischemic workup in past. She was not on a home diuretic regimen. She was diuresed with 40 mg IV lasix BID.She was transitioned to [employer] today.  Weight is down 17 lbs from admission - discharge weight is 249 lbs.Orthostatics vitals negative for hypotension. She was euvolemic on exam. She discharged on 40 mg lasix daily and was instructed to weigh daily and keep a log.   She had TEE guided cardioversion 1/28 which was initially successful but then had ERAF on  f/u appointment with general cardiology 2/07. However, it is unclear how long she has had persistent afib after she developed in 2016 and was lost to f/u until January of this year. However, pt reports that she felt well until Christmas when she started retaining fluid and noted the shortness of breath.  She is in the afib clinic, 07/19/17 to discuss options to restore SR. We discussed flecainide and scheduled a Myoview which showed risk for CAD. She then went on to have a cath without any significant CAD.  F/u in afib clinic, 4/11, she ultimately was loaded on flecainide 100 mg bid and went on to have successful cardioversion but returns in afib today. Her cardizem was reduced to 120 mg bid from 300 mg daily for soft BP's last week.   Her heart rate is 122 bpm today but reports still with  soft BP's at home.   F/u in afib clinic 8/13. Pt is here from f/u with [name] for her persistent atrial flutter. She was offered ablation but she deferred. They discussed  Tikosyn but pt wanted to wait. She is now ready to commit in the next couple of weeks to get set up for hospitalization for Tikosyn. She is taking a lot of supplements and has been asked to stop these. She wants to take branded Tikosyn and can afford this, around 24$ a month.  F/u in afib clinic, 02/07/18. Pt is here for Tikosyn admit. She states no benadryl use. No missed doses of Eliquis.   F/u in afib clinic, 9/17, following loading of tikosyn. Her final dose was 125 mcg bid due to prolong qt. However, she has been in and out of afib since Tikosyn load. She is aware of when she goes into afib and believes she is in afib more than SR. She did spontaneously convert to SR with Tikosyn start. She is on brand Tikosyn.  Today, she denies symptoms of palpitations, chest pain, shortness of breath, orthopnea, PND, lower extremity edema, dizziness, presyncope, syncope, or neurologic sequela. The patient is tolerating medications without difficulties and is otherwise without complaint today.   Past Medical History:  Diagnosis Date  . Asthma   . Atrial fibrillation ([place])   . Back pain   . Diastolic dysfunction   . Fibromyalgia   . Gestational diabetes   . Knee pain   . Osteoarthritis   . Persistent atrial fibrillation ([place])   . Sleep apnea   . Visit for monitoring Tikosyn therapy 02/07/2018   Past Surgical History:  Procedure Laterality Date  . ABDOMINAL HYSTERECTOMY    . APPENDECTOMY    . CARDIOVERSION N/A 07/04/2017   Procedure: CARDIOVERSION;  Surgeon: [name], MD;  Location[facility] END[facility]COPY;  Service: Cardiovascular;  Laterality: N/A;  . CARDIOVERSION N/A 09/06/2017   Procedure: CARDIOVERSION;  Surgeon: [name], MD;  Location: [facility] ENDOSCOPY;  Service: Cardiovascular;  Laterality: N/A;  . CHOLECYSTECTOMY N/A 02/24/2015   Procedure: LAPAROSCOPIC CHOLECYSTECTOMY;  Surgeon: [name], MD;  Location: [place];  Service: General;  Laterality: N/A;  . RIGHT/LEFT HEART CATH AND CORONARY  ANGIOGRAPHY N/A 08/09/2017   Procedure: RIGHT/LEFT HEART CATH AND CORONARY ANGIOGRAPHY;  Surgeon: [name], MD;  Location: [place] CV LAB;  Service: Cardiovascular;  Laterality: N/A;  . TEE WITHOUT CARDIOVERSION N/A 07/04/2017   Procedure: TRANSESOPHAGEAL ECHOCARDIOGRAM (TEE);  Surgeon: [name], MD;  Location: [facility] ENDOSCOPY;  Service: Cardiovascular;  Laterality: N/A;    Current Outpatient Medications  Medication Sig Dispense Refill  . acetaminophen (TYLENOL) 325 MG tablet Take 325 mg by mouth every 6 (six) hours as needed for moderate pain or headache.    [name] apixaban (ELIQUIS) 5 MG TABS tablet Take 1 tablet (5 mg total) by mouth 2 (two) times daily. 60 tablet 1  . diltiazem (CARDIZEM CD) 120 MG 24 hr capsule Take 1 capsule (120 mg total) by mouth 2 (two) times daily. 180 capsule 2  . dofetilide (TIKOSYN) 125 MCG capsule Take 1 capsule (125 mcg total) by mouth 2 (two) times daily. 180 capsule 2  . furosemide (LASIX) 40 MG tablet Take 1 tablet (40 mg total) by mouth daily. May take an additional 1/2 tablet (20mg ) daily if needed for fluid retention.    . Hyaluronic Acid 20 MG/ML GEL Inject 20 mg as directed every 6 (six) months.     . Hyaluronic Acid 20-60 MG CAPS Take 20 mg by mouth daily.    . magnesium oxide (MAG-OX) 400 (241.3 Mg) MG tablet Take 1 tablet (400 mg total) by mouth daily. 30 tablet 6  . Multiple Vitamin (MULTIVITAMIN WITH MINERALS) TABS tablet Take 1 tablet by mouth daily.     . polyethylene glycol powder (GLYCOLAX/MIRALAX) powder Take 17 g by mouth daily. 3350 g 0  . potassium chloride SA (K-DUR,KLOR-CON) 20 MEQ tablet Take 1 tablet (20 mEq total) by mouth daily. Take an extra 1/2 tablet by mouth on days when you take the extra fluid pill (furosemide/Lasix).    . Vitamin D, Ergocalciferol, (DRISDOL) 50000 units CAPS capsule TAKE 1 CAPSULE BY MOUTH EVERY 7 DAYS 4 capsule 0   No current facility-administered medications for this encounter.     Allergies  Allergen  Reactions  . Other Other (See Comments)    Metals Polyester - including hospital gowns and sheets - allergic contact dermatitis  . Sulfur Hives    Social History   Socioeconomic History  . Marital status: Married    Spouse name: Not on file  . Number of children: 4  . Years of education: Not on file  . Highest education level: Not on file  Occupational History  . Occupation: Disabled/retired  Social Needs  . Financial resource strain: Not on file  . Food insecurity:    Worry: Not on file    Inability: Not on file  . Transportation needs:    Medical: Not on file    Non-medical: Not on file  Tobacco Use  . Smoking status: Never Smoker  . Smokeless tobacco: Never Used  Substance and  Sexual Activity  . Alcohol use: Yes    Comment: little  . Drug use: No  . Sexual activity: Not Currently  Lifestyle  . Physical activity:    Days per week: Not on file    Minutes per session: Not on file  . Stress: Not on file  Relationships  . Social connections:    Talks on phone: Not on file    Gets together: Not on file    Attends religious service: Not on file    Active member of club or organization: Not on file    Attends meetings of clubs or organizations: Not on file    Relationship status: Not on file  . Intimate partner violence:    Fear of current or ex partner: Not on file    Emotionally abused: Not on file    Physically abused: Not on file    Forced sexual activity: Not on file  Other Topics Concern  . Not on file  Social History Narrative   Lives in [place]   Not working    Family History  Problem Relation Age of Onset  . Heart disease Mother   . Hypertension Mother   . Hyperlipidemia Mother   . Diabetes Mother   . Sudden death Mother   . Thyroid disease Mother   . Obesity Mother   . Heart disease Father   . Heart attack Father   . Hypertension Father   . Hyperlipidemia Father   . Diabetes Father     ROS- All systems are reviewed and negative except  as per the HPI above  Physical Exam: Vitals:   02/21/18 0958  BP: 122/74  Pulse: (!) 112  Weight: 109.1 kg  Height: 5\' 5"  (1.651 m)   Wt Readings from Last 3 Encounters:  02/21/18 109.1 kg  02/21/18 107 kg  02/11/18 108.7 kg    Labs: Lab Results  Component Value Date   NA 139 02/10/2018   K 4.1 02/10/2018   CL 108 02/10/2018   CO2 24 02/10/2018   GLUCOSE 100 (H) 02/10/2018   BUN 21 02/10/2018   CREATININE 0.85 02/10/2018   CALCIUM 8.9 02/10/2018   MG 2.4 02/10/2018   Lab Results  Component Value Date   INR 1.07 08/09/2017   Lab Results  Component Value Date   CHOL 158 11/02/2017   HDL 30 (L) 11/02/2017   LDLCALC 98 11/02/2017   TRIG 151 (H) 11/02/2017     GEN- The patient is well appearing, alert and oriented x 3 today.   Head- normocephalic, atraumatic Eyes-  Sclera clear, conjunctiva pink Ears- hearing intact Oropharynx- clear Neck- supple, no JVP Lymph- no cervical lymphadenopathy Lungs- Clear to ausculation bilaterally, normal work of breathing Heart- irregular rate and rhythm, no murmurs, rubs or gallops, PMI not laterally displaced GI- soft, NT, ND, + BS Extremities- no clubbing, cyanosis, or edema MS- no significant deformity or atrophy Skin- no rash or lesion Psych- euthymic mood, full affect Neuro- strength and sensation are intact  EKG- atrial flutter at 112 bpm, Qrs int 80 ms, qtc 455 ms Echo-Study Conclusions  - Left ventricle: The cavity size was normal. Wall thickness was   increased in a pattern of mild LVH. Systolic function was normal.   The estimated ejection fraction was in the range of 60% to 65%.   Wall motion was normal; there were no regional wall motion   abnormalities. The study is not technically sufficient to allow   evaluation of LV diastolic function. -  Mitral valve: Calcified annulus. Mildly thickened leaflets .   There was mild regurgitation. Valve area by continuity equation   (using LVOT flow): 2.07 cm^2. - Left  atrium: The atrium was mildly dilated. - Atrial septum: No defect or patent foramen ovale was identified. TEE-Study Conclusions  - Left ventricle: Systolic function was normal. The estimated   ejection fraction was in the range of 50% to 55%. No evidence of   thrombus. - Ventricular septum: The contour showed a normal configuration.   There was no evidence of a ventricular septal defect. - Mitral valve: There was mild regurgitation. - Left atrium: No formed thrombus. Dense spontaneous contrast No   evidence of thrombus in the atrial cavity or appendage. - Right ventricle: The cavity size was mildly dilated. - Atrial septum: There was a patent foramen ovale. - Impressions: Advanced 3D rendering of the atrial septum was   performed. [place] x 1 150 Joules   Converted from afib rate 110 to NSR rate 80&'s   No immediate neurologic sequela  Stress test- Summary Defect 1:  There is a medium defect of moderate severity. The defect is reversible.  Overall Study Impression Myocardial perfusion is abnormal. Findings consistent with ischemia. This is an intermediate risk study. Overall left ventricular systolic function was normal. LV cavity size is normal. Nuclear stress EF: 43%.       Cath report-Conclusion    Normal left main.  Normal left anterior descending coronary artery.  Normal circumflex coronary artery.  Normal dominant RCA.  Normal LV size with normal hemodynamics and estimated ejection fraction of 50%.  False positive myocardial perfusion imaging.  Mild pulmonary hypertension with mean pressure of 27 mmHg.  RECOMMENDATIONS:   Management of atrial fibrillation with aggressive rate control and stroke prophylaxis     Impressions:  - Advanced 3D rendering of the atrial septum was performed. [place] x 1   150 Joules   Converted from afib rate 110 to NSR rate 80&'s   No immediate neurologic sequela Successful cardioversion. No   cardiac source of emboli was  indentified.   Assessment and Plan: 1. Persistent afib Flecainide used in past  and with successful cardioversion, but with ERAF Now with tikosyn load but still having a lot of breakthrough afib Diltiazem 120 mg bid Continue eliquis without missed doses for a chadsvasc score of  3 Reminded to continue off  all supplements that may interfere with Tikosyn( see pharmacy note), she states that she has  Bmet/mag today Qtc acceptable at 475 ms  2. Lifestyle issues Working on weight loss measures  She has untreated sleep apnea 2/2 not being able to tolerate CPAP  F/u with [name] 10/2   Geroge Baseman. Floyd Lusignan, [facility] [address] [place], [place] [address] [phone]

## 2018-02-22 NOTE — Progress Notes (Signed)
Office: [phone]  /  Fax: [phone]   HPI:   Chief Complaint: OBESITY Christie Atkinson is here to discuss her progress with her obesity treatment plan. She is on the Category 2 plan and is following her eating plan approximately 95 % of the time. She states she is exercising 0 minutes 0 times per week. Patches was recently hospitalized for Tikosyn loading. She notes she is still going out of sinus rhythm. She doesn't think she was eating enough, and meal skipping but tries to make up for skipping.  Her weight is 236 lb (107 kg) today and has had a weight loss of 1 pound over a period of 2 weeks since her last visit. She has lost 13 lbs since starting treatment with [name].  Vitamin D Deficiency Christie Atkinson has a diagnosis of vitamin D deficiency. She is currently taking prescription Vit D. She notes fatigue and denies nausea, vomiting or muscle weakness.  Atrial Fibrillation Christie Atkinson is still taking Eliquis and now on Tikosyn. She is occasionally going out of sinus rhythm.  ALLERGIES: Allergies  Allergen Reactions  . Other Other (See Comments)    Metals Polyester - including hospital gowns and sheets - allergic contact dermatitis  . Sulfur Hives    MEDICATIONS: Current Outpatient Medications on File Prior to Visit  Medication Sig Dispense Refill  . acetaminophen (TYLENOL) 325 MG tablet Take 325 mg by mouth every 6 (six) hours as needed for moderate pain or headache.    [name] apixaban (ELIQUIS) 5 MG TABS tablet Take 1 tablet (5 mg total) by mouth 2 (two) times daily. 60 tablet 1  . diltiazem (CARDIZEM CD) 120 MG 24 hr capsule Take 1 capsule (120 mg total) by mouth 2 (two) times daily. 180 capsule 2  . dofetilide (TIKOSYN) 125 MCG capsule Take 1 capsule (125 mcg total) by mouth 2 (two) times daily. 180 capsule 2  . furosemide (LASIX) 40 MG tablet Take 1 tablet (40 mg total) by mouth daily. May take an additional 1/2 tablet (20mg ) daily if needed for fluid retention.    . Hyaluronic Acid 20 MG/ML  GEL Inject 20 mg as directed every 6 (six) months.     . Hyaluronic Acid 20-60 MG CAPS Take 20 mg by mouth daily.    . magnesium oxide (MAG-OX) 400 (241.3 Mg) MG tablet Take 1 tablet (400 mg total) by mouth daily. 30 tablet 6  . Multiple Vitamin (MULTIVITAMIN WITH MINERALS) TABS tablet Take 1 tablet by mouth daily.     . polyethylene glycol powder (GLYCOLAX/MIRALAX) powder Take 17 g by mouth daily. 3350 g 0  . potassium chloride SA (K-DUR,KLOR-CON) 20 MEQ tablet Take 1 tablet (20 mEq total) by mouth daily. Take an extra 1/2 tablet by mouth on days when you take the extra fluid pill (furosemide/Lasix).    . Vitamin D, Ergocalciferol, (DRISDOL) 50000 units CAPS capsule TAKE 1 CAPSULE BY MOUTH EVERY 7 DAYS 4 capsule 0   No current facility-administered medications on file prior to visit.     PAST MEDICAL HISTORY: Past Medical History:  Diagnosis Date  . Asthma   . Atrial fibrillation ([place])   . Back pain   . Diastolic dysfunction   . Fibromyalgia   . Gestational diabetes   . Knee pain   . Osteoarthritis   . Persistent atrial fibrillation ([place])   . Sleep apnea   . Visit for monitoring Tikosyn therapy 02/07/2018    PAST SURGICAL HISTORY: Past Surgical History:  Procedure Laterality Date  . ABDOMINAL  HYSTERECTOMY    . APPENDECTOMY    . CARDIOVERSION N/A 07/04/2017   Procedure: CARDIOVERSION;  Surgeon: [name], MD;  Location[facility] ENDOSCOPY;  Service: Cardiovascular;  Laterality: N/A;  . CARDIOVERSION N/A 09/06/2017   Procedure: CARDIOVERSION;  Surgeon: [name], MD;  Location: [facility] ENDOSCOPY;  Service: Cardiovascular;  Laterality: N/A;  . CHOLECYSTECTOMY N/A 02/24/2015   Procedure: LAPAROSCOPIC CHOLECYSTECTOMY;  Surgeon: [name], MD;  Location: [place];  Service: General;  Laterality: N/A;  . RIGHT/LEFT HEART CATH AND CORONARY ANGIOGRAPHY N/A 08/09/2017   Procedure: RIGHT/LEFT HEART CATH AND CORONARY ANGIOGRAPHY;  Surgeon: [name], MD;  Location: [place] CV LAB;   Service: Cardiovascular;  Laterality: N/A;  . TEE WITHOUT CARDIOVERSION N/A 07/04/2017   Procedure: TRANSESOPHAGEAL ECHOCARDIOGRAM (TEE);  Surgeon: [name], MD;  Location: [facility] ENDOSCOPY;  Service: Cardiovascular;  Laterality: N/A;    SOCIAL HISTORY: Social History   Tobacco Use  . Smoking status: Never Smoker  . Smokeless tobacco: Never Used  Substance Use Topics  . Alcohol use: Yes    Comment: little  . Drug use: No    FAMILY HISTORY: Family History  Problem Relation Age of Onset  . Heart disease Mother   . Hypertension Mother   . Hyperlipidemia Mother   . Diabetes Mother   . Sudden death Mother   . Thyroid disease Mother   . Obesity Mother   . Heart disease Father   . Heart attack Father   . Hypertension Father   . Hyperlipidemia Father   . Diabetes Father     ROS: Review of Systems  Constitutional: Positive for malaise/fatigue and weight loss.  Gastrointestinal: Negative for nausea and vomiting.  Musculoskeletal:       Negative muscle weakness    PHYSICAL EXAM: Blood pressure (!) 94/94, pulse 65, temperature (!) 97.4 F (36.3 C), temperature source Oral, height 5\' 5"  (1.651 m), weight 236 lb (107 kg), SpO2 96 %. Body mass index is 39.27 kg/m. Physical Exam  Constitutional: She is oriented to person, place, and time. She appears well-developed and well-nourished.  Cardiovascular: Normal rate.  Pulmonary/Chest: Effort normal.  Musculoskeletal: Normal range of motion.  Neurological: She is oriented to person, place, and time.  Skin: Skin is warm and dry.  Psychiatric: She has a normal mood and affect. Her behavior is normal.  Vitals reviewed.   RECENT LABS AND TESTS: BMET    Component Value Date/Time   NA 138 02/21/2018 1025   NA 140 11/02/2017 1254   K 4.0 02/21/2018 1025   CL 106 02/21/2018 1025   CO2 26 02/21/2018 1025   GLUCOSE 116 (H) 02/21/2018 1025   BUN 23 02/21/2018 1025   BUN 13 11/02/2017 1254   CREATININE 0.89 02/21/2018 1025    CALCIUM 9.2 02/21/2018 1025   GFRNONAA >60 02/21/2018 1025   GFRAA >60 02/21/2018 1025   Lab Results  Component Value Date   HGBA1C 5.8 (H) 11/02/2017   HGBA1C 5.8 (H) 06/26/2017   Lab Results  Component Value Date   INSULIN 8.5 11/02/2017   CBC    Component Value Date/Time   WBC 7.4 11/02/2017 1254   WBC 7.4 08/31/2017 0930   RBC 4.49 11/02/2017 1254   RBC 4.63 08/31/2017 0930   HGB 14.1 11/02/2017 1254   HCT 41.6 11/02/2017 1254   PLT 207 08/31/2017 0930   PLT 247 06/24/2017 1046   MCV 93 11/02/2017 1254   MCH 31.4 11/02/2017 1254   MCH 30.2 08/31/2017  0930   MCHC 33.9 11/02/2017 1254   MCHC 33.2 08/31/2017 0930   RDW 14.7 11/02/2017 1254   LYMPHSABS 2.6 11/02/2017 1254   MONOABS 1.2 (H) 02/23/2015 0619   EOSABS 0.1 11/02/2017 1254   BASOSABS 0.0 11/02/2017 1254   Iron/TIBC/Ferritin/ %Sat No results found for: IRON, TIBC, FERRITIN, IRONPCTSAT Lipid Panel     Component Value Date/Time   CHOL 158 11/02/2017 1304   TRIG 151 (H) 11/02/2017 1304   HDL 30 (L) 11/02/2017 1304   CHOLHDL 5.4 06/26/2017 0517   VLDL 15 06/26/2017 0517   LDLCALC 98 11/02/2017 1304   Hepatic Function Panel     Component Value Date/Time   PROT 7.7 11/02/2017 1254   ALBUMIN 4.2 11/02/2017 1254   AST 22 11/02/2017 1254   ALT 18 11/02/2017 1254   ALKPHOS 75 11/02/2017 1254   BILITOT 0.8 11/02/2017 1254      Component Value Date/Time   TSH 3.010 11/02/2017 1254   TSH 4.410 06/24/2017 1046   TSH 1.260 02/22/2015 1954    ASSESSMENT AND PLAN: Vitamin D deficiency  Chronic atrial fibrillation (HCC)  Class 2 severe obesity with serious comorbidity and body mass index (BMI) of 39.0 to 39.9 in adult, unspecified obesity type ([place])  PLAN:  Vitamin D Deficiency Sanam was informed that low vitamin D levels contributes to fatigue and are associated with obesity, breast, and colon cancer. Cameshia agrees to continue taking prescription Vit D @50 ,000 IU every week and will follow up for  routine testing of vitamin D, at least 2-3 times per year. She was informed of the risk of over-replacement of vitamin D and agrees to not increase her dose unless she discusses this with [name] first. Charlaine agrees to follow up with our clinic in 2 weeks.  Atrial Fibrillation Nazaria is to follow up with her Cardiologist as previously scheduled. Patricia agrees to follow up with our clinic in 2 weeks.  I spent > than 50% of the 15 minute visit on counseling as documented in the note.  Obesity Doreen is currently in the action stage of change. As such, her goal is to continue with weight loss efforts She has agreed to follow the Category 2 plan Hayzlee has been instructed to work up to a goal of 150 minutes of combined cardio and strengthening exercise per week for weight loss and overall health benefits. We discussed the following Behavioral Modification Strategies today: increasing lean protein intake, increasing vegetables, work on meal planning and easy cooking plans, and planning for success   Markita has agreed to follow up with our clinic in 2 weeks. She was informed of the importance of frequent follow up visits to maximize her success with intensive lifestyle modifications for her multiple health conditions.   OBESITY BEHAVIORAL INTERVENTION VISIT  Today's visit was # 6   Starting weight: 249 lbs Starting date: 11/02/17 Today's weight : 236 lbs  Today's date: 02/21/2018 Total lbs lost to date: 13    ASK: We discussed the diagnosis of obesity with [name] today and Yaira agreed to give [name] permission to discuss obesity behavioral modification therapy today.  ASSESS: Naelle has the diagnosis of obesity and her BMI today is 39.27 Maudell is in the action stage of change   ADVISE: Bahar was educated on the multiple health risks of obesity as well as the benefit of weight loss to improve her health. She was advised of the need for long term treatment and the  importance of lifestyle modifications  to improve her current health and to decrease her risk of future health problems.  AGREE: Multiple dietary modification options and treatment options were discussed and  Danett agreed to follow the recommendations documented in the above note.  ARRANGE: Deidrea was educated on the importance of frequent visits to treat obesity as outlined per CMS and USPSTF guidelines and agreed to schedule her next follow up appointment today.  I, Trixie Dredge, am acting as transcriptionist for Ilene Qua, MD  I have reviewed the above documentation for accuracy and completeness, and I agree with the above. - Ilene Qua, MD

## 2018-03-07 ENCOUNTER — Ambulatory Visit (INDEPENDENT_AMBULATORY_CARE_PROVIDER_SITE_OTHER): Payer: Medicare Other | Admitting: Family Medicine

## 2018-03-08 ENCOUNTER — Ambulatory Visit (INDEPENDENT_AMBULATORY_CARE_PROVIDER_SITE_OTHER): Payer: Medicare Other | Admitting: Family Medicine

## 2018-03-08 ENCOUNTER — Ambulatory Visit (INDEPENDENT_AMBULATORY_CARE_PROVIDER_SITE_OTHER): Payer: Medicare Other | Admitting: Internal Medicine

## 2018-03-08 ENCOUNTER — Encounter: Payer: Self-pay | Admitting: Internal Medicine

## 2018-03-08 VITALS — BP 116/78 | HR 89 | Ht 66.0 in | Wt 233.0 lb

## 2018-03-08 VITALS — BP 100/67 | HR 51 | Temp 97.4°F | Ht 65.0 in | Wt 232.0 lb

## 2018-03-08 DIAGNOSIS — I5032 Chronic diastolic (congestive) heart failure: Secondary | ICD-10-CM

## 2018-03-08 DIAGNOSIS — E559 Vitamin D deficiency, unspecified: Secondary | ICD-10-CM | POA: Diagnosis not present

## 2018-03-08 DIAGNOSIS — I4819 Other persistent atrial fibrillation: Secondary | ICD-10-CM

## 2018-03-08 DIAGNOSIS — Z6838 Body mass index (BMI) 38.0-38.9, adult: Secondary | ICD-10-CM

## 2018-03-08 DIAGNOSIS — G4733 Obstructive sleep apnea (adult) (pediatric): Secondary | ICD-10-CM

## 2018-03-08 DIAGNOSIS — I482 Chronic atrial fibrillation, unspecified: Secondary | ICD-10-CM

## 2018-03-08 MED ORDER — VITAMIN D (ERGOCALCIFEROL) 1.25 MG (50000 UNIT) PO CAPS: ORAL_CAPSULE | ORAL | 0 refills | Status: DC

## 2018-03-08 NOTE — Progress Notes (Signed)
PCP: Patient, No Pcp Per Primary Cardiologist: Dr Tamala Julian Primary EP: Dr [name]  Christie Atkinson is a 62 y.o. female who presents today for routine electrophysiology followup.  Since last being seen in our clinic, the patient reports doing very well.  Unfortunately, she has had ongoing issues with afib despite medical therapy with tikosyn.  She has fatigue and decreased exercise tolerance with her afib. Today, she denies symptoms of palpitations, chest pain, shortness of breath,  lower extremity edema, dizziness, presyncope, or syncope.  The patient is otherwise without complaint today.   Past Medical History:  Diagnosis Date  . Asthma   . Atrial fibrillation ([place])   . Back pain   . Diastolic dysfunction   . Fibromyalgia   . Gestational diabetes   . Knee pain   . Osteoarthritis   . Persistent atrial fibrillation   . Sleep apnea   . Visit for monitoring Tikosyn therapy 02/07/2018   Past Surgical History:  Procedure Laterality Date  . ABDOMINAL HYSTERECTOMY    . APPENDECTOMY    . CARDIOVERSION N/A 07/04/2017   Procedure: CARDIOVERSION;  Surgeon: [name], MD;  [facility] ENDOSCOPY;  Service: Cardiovascular;  Laterality: N/A;  . CARDIOVERSION N/A 09/06/2017   Procedure: CARDIOVERSION;  Surgeon: [name], MD;  Location: [facility] ENDOSCOPY;  Service: Cardiovascular;  Laterality: N/A;  . CHOLECYSTECTOMY N/A 02/24/2015   Procedure: LAPAROSCOPIC CHOLECYSTECTOMY;  Surgeon: [name], MD;  Location: [place];  Service: General;  Laterality: N/A;  . RIGHT/LEFT HEART CATH AND CORONARY ANGIOGRAPHY N/A 08/09/2017   Procedure: RIGHT/LEFT HEART CATH AND CORONARY ANGIOGRAPHY;  Surgeon: [name], MD;  Location: [place] CV LAB;  Service: Cardiovascular;  Laterality: N/A;  . TEE WITHOUT CARDIOVERSION N/A 07/04/2017   Procedure: TRANSESOPHAGEAL ECHOCARDIOGRAM (TEE);  Surgeon: [name], MD;  Location: [facility] ENDOSCOPY;  Service: Cardiovascular;  Laterality: N/A;    ROS- all systems  are reviewed and negatives except as per HPI above  Current Outpatient Medications  Medication Sig Dispense Refill  . acetaminophen (TYLENOL) 325 MG tablet Take 325 mg by mouth every 6 (six) hours as needed for moderate pain or headache.    [name] apixaban (ELIQUIS) 5 MG TABS tablet Take 1 tablet (5 mg total) by mouth 2 (two) times daily. 60 tablet 1  . diltiazem (CARDIZEM CD) 120 MG 24 hr capsule Take 1 capsule (120 mg total) by mouth 2 (two) times daily. 180 capsule 2  . dofetilide (TIKOSYN) 125 MCG capsule Take 1 capsule (125 mcg total) by mouth 2 (two) times daily. 180 capsule 2  . furosemide (LASIX) 40 MG tablet Take 1 tablet (40 mg total) by mouth daily. May take an additional 1/2 tablet (20mg ) daily if needed for fluid retention.    . Hyaluronic Acid 20 MG/ML GEL Inject 20 mg as directed every 6 (six) months.     . Hyaluronic Acid 20-60 MG CAPS Take 20 mg by mouth daily.    . magnesium oxide (MAG-OX) 400 (241.3 Mg) MG tablet Take 1 tablet (400 mg total) by mouth daily. 30 tablet 6  . Multiple Vitamin (MULTIVITAMIN WITH MINERALS) TABS tablet Take 1 tablet by mouth daily.     . polyethylene glycol powder (GLYCOLAX/MIRALAX) powder Take 17 g by mouth daily. 3350 g 0  . potassium chloride SA (K-DUR,KLOR-CON) 20 MEQ tablet Take 1 tablet (20 mEq total) by mouth daily. Take an extra 1/2 tablet by mouth on days when you take the extra fluid pill (furosemide/Lasix).    [name]  Vitamin D, Ergocalciferol, (DRISDOL) 50000 units CAPS capsule TAKE 1 CAPSULE BY MOUTH EVERY 7 DAYS 4 capsule 0   No current facility-administered medications for this visit.     Physical Exam: Vitals:   03/08/18 1354  BP: 116/78  Pulse: 89  SpO2: 98%  Weight: 233 lb (105.7 kg)  Height: 5\' 6"  (1.676 m)    GEN- The patient is well appearing, alert and oriented x 3 today.   Head- normocephalic, atraumatic Eyes-  Sclera clear, conjunctiva pink Ears- hearing intact Oropharynx- clear Lungs- Clear to ausculation bilaterally,  normal work of breathing Heart- Regular rate and rhythm, no murmurs, rubs or gallops, PMI not laterally displaced GI- soft, NT, ND, + BS Extremities- no clubbing, cyanosis, or edema  Wt Readings from Last 3 Encounters:  03/08/18 233 lb (105.7 kg)  03/08/18 232 lb (105.2 kg)  02/21/18 240 lb 9.6 oz (109.1 kg)    EKG tracing ordered today is personally reviewed and shows afib, V rate 89 bpm, QTc 447 msec  Assessment and Plan:  1. Persistent afib The patient has symptomatic, recurrent persistent atrial fibrillation. she has failed medical therapy with tikosyn. Chads2vasc score is 1.  she is anticoagulated with eliquis. Therapeutic strategies for afib including medicine and ablation were discussed in detail with the patient today. Risk, benefits, and alternatives to EP study and radiofrequency ablation for afib were also discussed in detail today. These risks include but are not limited to stroke, bleeding, vascular damage, tamponade, perforation, damage to the esophagus, lungs, and other structures, pulmonary vein stenosis, worsening renal function, and death. The patient understands these risk and wishes to proceed.  We will therefore proceed with catheter ablation at the next available time.  Carto, ICE, anesthesia are requested for the procedure.  Will also obtain TEE prior to the procedure to exclude LAA thrombus and further evaluate atrial anatomy.  2. Obesity Body mass index is 37.61 kg/m. Wt Readings from Last 3 Encounters:  03/08/18 233 lb (105.7 kg)  03/08/18 232 lb (105.2 kg)  02/21/18 240 lb 9.6 oz (109.1 kg)   Lifestyle modification encouraged  3. OSA Followed by Drs Toy Cookey and Turner  4. Chronic diastolic dysfunction Stable No change required today   [name] MD, [facility] 03/08/2018 2:01 PM

## 2018-03-08 NOTE — Patient Instructions (Addendum)
Medication Instructions:  Your physician recommends that you continue on your current medications as directed. Please refer to the Current Medication list given to you today. If you need a refill on your cardiac medications before your next appointment, please call your pharmacy.  Labwork: You will get lab work today:  BMP and CBC.  Testing/Procedures:  Your physician has requested that you have a TEE. During a TEE, sound waves are used to create images of your heart. It provides your doctor with information about the size and shape of your heart and how well your heart's chambers and valves are working. In this test, a transducer is attached to the end of a flexible tube that's guided down your throat and into your esophagus (the tube leading from you mouth to your stomach) to get a more detailed image of your heart. You are not awake for the procedure. Please see the instruction sheet given to you today. For further information please visit [email or web address].   Your physician has recommended that you have an ablation. Catheter ablation is a medical procedure used to treat some cardiac arrhythmias (irregular heartbeats). During catheter ablation, a long, thin, flexible tube is put into a blood vessel in your groin (upper thigh), or neck. This tube is called an ablation catheter. It is then guided to your heart through the blood vessel. Radio frequency waves destroy small areas of heart tissue where abnormal heartbeats may cause an arrhythmia to start. Please see the instruction sheet given to you today.  Follow-Up: You will follow up with Roderic Palau, NP with the Atrial fibrillation (Afib) clinic 4 weeks after your ablation.  You will follow up with [name] 3 months after your procedure.    TEE/ABLATION INSTRUCTIONS:  You are scheduled for a TEE on 03/09/2018 with Dr. Acie Fredrickson.   Please arrive at the [facility] (Main Entrance A) at [facility]: [place],  [place] [id number] at 7:00 am.  DIET: Nothing to eat or drink after midnight  Medication Instructions: DO NOT TAKE ANY MEDICATIONS TOMORROW MORNING     Transesophageal Echocardiogram Transesophageal echocardiography (TEE) is a picture test of your heart using sound waves. The pictures taken can give very detailed pictures of your heart. This can help your doctor see if there are problems with your heart. TEE can check:  If your heart has blood clots in it.  How well your heart valves are working.  If you have an infection on the inside of your heart.  Some of the major arteries of your heart.  If your heart valve is working after a [employer].  Your heart before a procedure that uses a shock to your heart to get the rhythm back to normal.  What happens before the procedure?  Do not eat or drink for 6 hours before the procedure or as told by your doctor.  Make plans to have someone drive you home after the procedure. Do not drive yourself home.  An IV tube will be put in your arm. What happens during the procedure?  You will be given a medicine to help you relax (sedative). It will be given through the IV tube.  A numbing medicine will be sprayed or gargled in the back of your throat to help numb it.  The tip of the probe is placed into the back of your mouth. You will be asked to swallow. This helps to pass the probe into your esophagus.  Once the tip of the probe is  in the right place, your doctor can take pictures of your heart.  You may feel pressure at the back of your throat. What happens after the procedure?  You will be taken to a recovery area so the sedative can wear off.  Your throat may be sore and scratchy. This will go away slowly over time.  You will go home when you are fully awake and able to swallow liquids.  You should have someone stay with you for the next 24 hours.  Do not drive or operate machinery for the next 24 hours. This information is not intended  to replace advice given to you by your health care provider. Make sure you discuss any questions you have with your health care provider. Document Released: 03/21/2009 Document Revised: 10/30/2015 Document Reviewed: 11/23/2012 Elsevier Interactive Patient Education  2018 [employer].   Cardiac Ablation Cardiac ablation is a procedure to disable (ablate) a small amount of heart tissue in very specific places. The heart has many electrical connections. Sometimes these connections are abnormal and can cause the heart to beat very fast or irregularly. Ablating some of the problem areas can improve the heart rhythm or return it to normal. Ablation may be done for people who:  Have Wolff-Parkinson-White syndrome.  Have fast heart rhythms (tachycardia).  Have taken medicines for an abnormal heart rhythm (arrhythmia) that were not effective or caused side effects.  Have a high-risk heartbeat that may be life-threatening.  During the procedure, a small incision is made in the neck or the groin, and a long, thin, flexible tube (catheter) is inserted into the incision and moved to the heart. Small devices (electrodes) on the tip of the catheter will send out electrical currents. A type of X-ray (fluoroscopy) will be used to help guide the catheter and to provide images of the heart. Tell a health care provider about:  Any allergies you have.  All medicines you are taking, including vitamins, herbs, eye drops, creams, and over-the-counter medicines.  Any problems you or family members have had with anesthetic medicines.  Any blood disorders you have.  Any surgeries you have had.  Any medical conditions you have, such as kidney failure.  Whether you are pregnant or may be pregnant. What are the risks? Generally, this is a safe procedure. However, problems may occur, including:  Infection.  Bruising and bleeding at the catheter insertion site.  Bleeding into the chest, especially into the  sac that surrounds the heart. This is a serious complication.  Stroke or blood clots.  Damage to other structures or organs.  Allergic reaction to medicines or dyes.  Need for a permanent pacemaker if the normal electrical system is damaged. A pacemaker is a small computer that sends electrical signals to the heart and helps your heart beat normally.  The procedure not being fully effective. This may not be recognized until months later. Repeat ablation procedures are sometimes required.  What happens before the procedure?  Follow instructions from your health care provider about eating or drinking restrictions.  Ask your health care provider about: ? Changing or stopping your regular medicines. This is especially important if you are taking diabetes medicines or blood thinners. ? Taking medicines such as aspirin and ibuprofen. These medicines can thin your blood. Do not take these medicines before your procedure if your health care provider instructs you not to.  Plan to have someone take you home from the hospital or clinic.  If you will be going home right after  the procedure, plan to have someone with you for 24 hours. What happens during the procedure?  To lower your risk of infection: ? Your health care team will wash or sanitize their hands. ? Your skin will be washed with soap. ? Hair may be removed from the incision area.  An IV tube will be inserted into one of your veins.  You will be given a medicine to help you relax (sedative).  The skin on your neck or groin will be numbed.  An incision will be made in your neck or your groin.  A needle will be inserted through the incision and into a large vein in your neck or groin.  A catheter will be inserted into the needle and moved to your heart.  Dye may be injected through the catheter to help your surgeon see the area of the heart that needs treatment.  Electrical currents will be sent from the catheter to ablate  heart tissue in desired areas. There are three types of energy that may be used to ablate heart tissue: ? Heat (radiofrequency energy). ? Laser energy. ? Extreme cold (cryoablation).  When the necessary tissue has been ablated, the catheter will be removed.  Pressure will be held on the catheter insertion area to prevent excessive bleeding.  A bandage (dressing) will be placed over the catheter insertion area. The procedure may vary among health care providers and hospitals. What happens after the procedure?  Your blood pressure, heart rate, breathing rate, and blood oxygen level will be monitored until the medicines you were given have worn off.  Your catheter insertion area will be monitored for bleeding. You will need to lie still for a few hours to ensure that you do not bleed from the catheter insertion area.  Do not drive for 24 hours or as long as directed by your health care provider. Summary  Cardiac ablation is a procedure to disable (ablate) a small amount of heart tissue in very specific places. Ablating some of the problem areas can improve the heart rhythm or return it to normal.  During the procedure, electrical currents will be sent from the catheter to ablate heart tissue in desired areas. This information is not intended to replace advice given to you by your health care provider. Make sure you discuss any questions you have with your health care provider. Document Released: 10/10/2008 Document Revised: 04/12/2016 Document Reviewed: 04/12/2016 Elsevier Interactive Patient Education  [employer].

## 2018-03-08 NOTE — H&P (View-Only) (Signed)
PCP: Patient, No Pcp Per Primary Cardiologist: Dr Tamala Julian Primary EP: Dr [name]  Christie Atkinson is a 62 y.o. female who presents today for routine electrophysiology followup.  Since last being seen in our clinic, the patient reports doing very well.  Unfortunately, she has had ongoing issues with afib despite medical therapy with tikosyn.  She has fatigue and decreased exercise tolerance with her afib. Today, she denies symptoms of palpitations, chest pain, shortness of breath,  lower extremity edema, dizziness, presyncope, or syncope.  The patient is otherwise without complaint today.   Past Medical History:  Diagnosis Date  . Asthma   . Atrial fibrillation ([place])   . Back pain   . Diastolic dysfunction   . Fibromyalgia   . Gestational diabetes   . Knee pain   . Osteoarthritis   . Persistent atrial fibrillation   . Sleep apnea   . Visit for monitoring Tikosyn therapy 02/07/2018   Past Surgical History:  Procedure Laterality Date  . ABDOMINAL HYSTERECTOMY    . APPENDECTOMY    . CARDIOVERSION N/A 07/04/2017   Procedure: CARDIOVERSION;  Surgeon: [name], MD;  Location: [facility] ENDOSCOPY;  Service: Cardiovascular;  Laterality: N/A;  . CARDIOVERSION N/A 09/06/2017   Procedure: CARDIOVERSION;  Surgeon: [name], MD;  Location: [facility] ENDOSCOPY;  Service: Cardiovascular;  Laterality: N/A;  . CHOLECYSTECTOMY N/A 02/24/2015   Procedure: LAPAROSCOPIC CHOLECYSTECTOMY;  Surgeon: [name], MD;  Location: [place];  Service: General;  Laterality: N/A;  . RIGHT/LEFT HEART CATH AND CORONARY ANGIOGRAPHY N/A 08/09/2017   Procedure: RIGHT/LEFT HEART CATH AND CORONARY ANGIOGRAPHY;  Surgeon: [name], MD;  Location: [place] CV LAB;  Service: Cardiovascular;  Laterality: N/A;  . TEE WITHOUT CARDIOVERSION N/A 07/04/2017   Procedure: TRANSESOPHAGEAL ECHOCARDIOGRAM (TEE);  Surgeon: [name], MD;  Location: [facility] ENDOSCOPY;  Service: Cardiovascular;  Laterality: N/A;    ROS- all systems  are reviewed and negatives except as per HPI above  Current Outpatient Medications  Medication Sig Dispense Refill  . acetaminophen (TYLENOL) 325 MG tablet Take 325 mg by mouth every 6 (six) hours as needed for moderate pain or headache.    [name] apixaban (ELIQUIS) 5 MG TABS tablet Take 1 tablet (5 mg total) by mouth 2 (two) times daily. 60 tablet 1  . diltiazem (CARDIZEM CD) 120 MG 24 hr capsule Take 1 capsule (120 mg total) by mouth 2 (two) times daily. 180 capsule 2  . dofetilide (TIKOSYN) 125 MCG capsule Take 1 capsule (125 mcg total) by mouth 2 (two) times daily. 180 capsule 2  . furosemide (LASIX) 40 MG tablet Take 1 tablet (40 mg total) by mouth daily. May take an additional 1/2 tablet (20mg ) daily if needed for fluid retention.    . Hyaluronic Acid 20 MG/ML GEL Inject 20 mg as directed every 6 (six) months.     . Hyaluronic Acid 20-60 MG CAPS Take 20 mg by mouth daily.    . magnesium oxide (MAG-OX) 400 (241.3 Mg) MG tablet Take 1 tablet (400 mg total) by mouth daily. 30 tablet 6  . Multiple Vitamin (MULTIVITAMIN WITH MINERALS) TABS tablet Take 1 tablet by mouth daily.     . polyethylene glycol powder (GLYCOLAX/MIRALAX) powder Take 17 g by mouth daily. 3350 g 0  . potassium chloride SA (K-DUR,KLOR-CON) 20 MEQ tablet Take 1 tablet (20 mEq total) by mouth daily. Take an extra 1/2 tablet by mouth on days when you take the extra fluid pill (furosemide/Lasix).    [name]  Vitamin D, Ergocalciferol, (DRISDOL) 50000 units CAPS capsule TAKE 1 CAPSULE BY MOUTH EVERY 7 DAYS 4 capsule 0   No current facility-administered medications for this visit.     Physical Exam: Vitals:   03/08/18 1354  BP: 116/78  Pulse: 89  SpO2: 98%  Weight: 233 lb (105.7 kg)  Height: 5\' 6"  (1.676 m)    GEN- The patient is well appearing, alert and oriented x 3 today.   Head- normocephalic, atraumatic Eyes-  Sclera clear, conjunctiva pink Ears- hearing intact Oropharynx- clear Lungs- Clear to ausculation bilaterally,  normal work of breathing Heart- Regular rate and rhythm, no murmurs, rubs or gallops, PMI not laterally displaced GI- soft, NT, ND, + BS Extremities- no clubbing, cyanosis, or edema  Wt Readings from Last 3 Encounters:  03/08/18 233 lb (105.7 kg)  03/08/18 232 lb (105.2 kg)  02/21/18 240 lb 9.6 oz (109.1 kg)    EKG tracing ordered today is personally reviewed and shows afib, V rate 89 bpm, QTc 447 msec  Assessment and Plan:  1. Persistent afib The patient has symptomatic, recurrent persistent atrial fibrillation. she has failed medical therapy with tikosyn. Chads2vasc score is 1.  she is anticoagulated with eliquis. Therapeutic strategies for afib including medicine and ablation were discussed in detail with the patient today. Risk, benefits, and alternatives to EP study and radiofrequency ablation for afib were also discussed in detail today. These risks include but are not limited to stroke, bleeding, vascular damage, tamponade, perforation, damage to the esophagus, lungs, and other structures, pulmonary vein stenosis, worsening renal function, and death. The patient understands these risk and wishes to proceed.  We will therefore proceed with catheter ablation at the next available time.  Carto, ICE, anesthesia are requested for the procedure.  Will also obtain TEE prior to the procedure to exclude LAA thrombus and further evaluate atrial anatomy.  2. Obesity Body mass index is 37.61 kg/m. Wt Readings from Last 3 Encounters:  03/08/18 233 lb (105.7 kg)  03/08/18 232 lb (105.2 kg)  02/21/18 240 lb 9.6 oz (109.1 kg)   Lifestyle modification encouraged  3. OSA Followed by Drs Toy Cookey and Turner  4. Chronic diastolic dysfunction Stable No change required today   [name] MD, [facility] 03/08/2018 2:01 PM

## 2018-03-09 ENCOUNTER — Encounter (HOSPITAL_COMMUNITY)
Admission: RE | Disposition: A | Discharge status: Home / Self Care | Payer: Self-pay | Source: Ambulatory Visit | Attending: Internal Medicine

## 2018-03-09 ENCOUNTER — Ambulatory Visit (HOSPITAL_COMMUNITY)
Admission: RE | Admit: 2018-03-09 | Discharge: 2018-03-10 | Disposition: A | Discharge status: Home / Self Care | Payer: Medicare Other | Source: Ambulatory Visit | Attending: Internal Medicine | Admitting: Internal Medicine

## 2018-03-09 ENCOUNTER — Encounter (HOSPITAL_COMMUNITY): Payer: Self-pay

## 2018-03-09 ENCOUNTER — Ambulatory Visit (HOSPITAL_COMMUNITY): Payer: Medicare Other | Admitting: Certified Registered Nurse Anesthetist

## 2018-03-09 ENCOUNTER — Ambulatory Visit (HOSPITAL_BASED_OUTPATIENT_CLINIC_OR_DEPARTMENT_OTHER): Payer: Medicare Other

## 2018-03-09 ENCOUNTER — Other Ambulatory Visit: Payer: Self-pay

## 2018-03-09 DIAGNOSIS — Z6837 Body mass index (BMI) 37.0-37.9, adult: Secondary | ICD-10-CM | POA: Insufficient documentation

## 2018-03-09 DIAGNOSIS — M797 Fibromyalgia: Secondary | ICD-10-CM | POA: Diagnosis not present

## 2018-03-09 DIAGNOSIS — G4733 Obstructive sleep apnea (adult) (pediatric): Secondary | ICD-10-CM | POA: Insufficient documentation

## 2018-03-09 DIAGNOSIS — I4819 Other persistent atrial fibrillation: Secondary | ICD-10-CM | POA: Insufficient documentation

## 2018-03-09 DIAGNOSIS — M1990 Unspecified osteoarthritis, unspecified site: Secondary | ICD-10-CM | POA: Insufficient documentation

## 2018-03-09 DIAGNOSIS — Z885 Allergy status to narcotic agent status: Secondary | ICD-10-CM | POA: Diagnosis not present

## 2018-03-09 DIAGNOSIS — I5032 Chronic diastolic (congestive) heart failure: Secondary | ICD-10-CM | POA: Insufficient documentation

## 2018-03-09 DIAGNOSIS — I4892 Unspecified atrial flutter: Secondary | ICD-10-CM | POA: Diagnosis not present

## 2018-03-09 DIAGNOSIS — E669 Obesity, unspecified: Secondary | ICD-10-CM | POA: Diagnosis not present

## 2018-03-09 DIAGNOSIS — J45909 Unspecified asthma, uncomplicated: Secondary | ICD-10-CM | POA: Insufficient documentation

## 2018-03-09 DIAGNOSIS — Z7901 Long term (current) use of anticoagulants: Secondary | ICD-10-CM | POA: Diagnosis not present

## 2018-03-09 DIAGNOSIS — I4891 Unspecified atrial fibrillation: Secondary | ICD-10-CM | POA: Diagnosis not present

## 2018-03-09 DIAGNOSIS — I340 Nonrheumatic mitral (valve) insufficiency: Secondary | ICD-10-CM

## 2018-03-09 HISTORY — PX: ATRIAL FIBRILLATION ABLATION: EP1191

## 2018-03-09 HISTORY — PX: TEE WITHOUT CARDIOVERSION: SHX5443

## 2018-03-09 HISTORY — PX: ABLATION OF DYSRHYTHMIC FOCUS: SHX254

## 2018-03-09 LAB — POCT ACTIVATED CLOTTING TIME
ACTIVATED CLOTTING TIME: 285 s
Activated Clotting Time: 252 seconds
Activated Clotting Time: 334 seconds
Activated Clotting Time: 197 seconds

## 2018-03-09 LAB — BASIC METABOLIC PANEL
BUN / CREAT RATIO: 23 (ref 12–28)
BUN: 22 mg/dL (ref 8–27)
CO2: 23 mmol/L (ref 20–29)
CREATININE: 0.96 mg/dL (ref 0.57–1.00)
Calcium: 9.5 mg/dL (ref 8.7–10.3)
Chloride: 102 mmol/L (ref 96–106)
GFR, EST AFRICAN AMERICAN: 73 mL/min/{1.73_m2} (ref >59)
GFR, EST NON AFRICAN AMERICAN: 63 mL/min/{1.73_m2} (ref >59)
Glucose: 91 mg/dL (ref 65–99)
Potassium: 4.3 mmol/L (ref 3.5–5.2)
Sodium: 141 mmol/L (ref 134–144)

## 2018-03-09 LAB — CBC WITH DIFFERENTIAL/PLATELET
BASOS: 1 %
Basophils Absolute: 0 10*3/uL (ref 0.0–0.2)
EOS (ABSOLUTE): 0.1 10*3/uL (ref 0.0–0.4)
Eos: 1 %
HEMOGLOBIN: 14 g/dL (ref 11.1–15.9)
Hematocrit: 42.2 % (ref 34.0–46.6)
IMMATURE GRANS (ABS): 0 10*3/uL (ref 0.0–0.1)
Immature Granulocytes: 0 %
LYMPHS ABS: 2.5 10*3/uL (ref 0.7–3.1)
LYMPHS: 29 %
MCH: 31.4 pg (ref 26.6–33.0)
MCHC: 33.2 g/dL (ref 31.5–35.7)
MCV: 95 fL (ref 79–97)
MONOCYTES: 11 %
Monocytes Absolute: 0.9 10*3/uL (ref 0.1–0.9)
NEUTROS ABS: 4.9 10*3/uL (ref 1.4–7.0)
Neutrophils: 58 %
Platelets: 256 10*3/uL (ref 150–450)
RBC: 4.46 x10E6/uL (ref 3.77–5.28)
RDW: 13.4 % (ref 12.3–15.4)
WBC: 8.5 10*3/uL (ref 3.4–10.8)

## 2018-03-09 SURGERY — ATRIAL FIBRILLATION ABLATION
Anesthesia: General

## 2018-03-09 SURGERY — ECHOCARDIOGRAM, TRANSESOPHAGEAL
Anesthesia: Moderate Sedation

## 2018-03-09 MED ORDER — LACTATED RINGERS IV SOLN
INTRAVENOUS | Status: DC | PRN
  Administered 2018-03-09: 11:00:00 via INTRAVENOUS

## 2018-03-09 MED ORDER — DILTIAZEM HCL ER COATED BEADS 120 MG PO CP24
120.0000 mg | ORAL_CAPSULE | Freq: Two times a day (BID) | ORAL | Status: DC
  Administered 2018-03-10: 120 mg via ORAL
  Filled 2018-03-09: qty 1

## 2018-03-09 MED ORDER — SODIUM CHLORIDE 0.9% FLUSH
3.0000 mL | Freq: Two times a day (BID) | INTRAVENOUS | Status: DC
  Administered 2018-03-09: 3 mL via INTRAVENOUS

## 2018-03-09 MED ORDER — HEPARIN (PORCINE) IN NACL 1000-0.9 UT/500ML-% IV SOLN
INTRAVENOUS | Status: DC | PRN
  Administered 2018-03-09: 500 mL

## 2018-03-09 MED ORDER — APIXABAN 5 MG PO TABS
5.0000 mg | ORAL_TABLET | Freq: Once | ORAL | Status: DC
  Filled 2018-03-09: qty 1

## 2018-03-09 MED ORDER — FENTANYL CITRATE (PF) 250 MCG/5ML IJ SOLN
INTRAMUSCULAR | Status: DC | PRN
  Administered 2018-03-09: 75 ug via INTRAVENOUS
  Administered 2018-03-09: 25 ug via INTRAVENOUS

## 2018-03-09 MED ORDER — SODIUM CHLORIDE 0.9 % IV SOLN
INTRAVENOUS | Status: DC | PRN
  Administered 2018-03-09: 25 ug/min via INTRAVENOUS

## 2018-03-09 MED ORDER — SUGAMMADEX SODIUM 200 MG/2ML IV SOLN
INTRAVENOUS | Status: DC | PRN
  Administered 2018-03-09: 211.4 mg via INTRAVENOUS

## 2018-03-09 MED ORDER — SODIUM CHLORIDE 0.9 % IV SOLN
INTRAVENOUS | Status: DC | PRN
  Administered 2018-03-09: 14:00:00 via INTRAVENOUS

## 2018-03-09 MED ORDER — PROPOFOL 10 MG/ML IV BOLUS
INTRAVENOUS | Status: DC | PRN
  Administered 2018-03-09: 30 mg via INTRAVENOUS
  Administered 2018-03-09: 40 mg via INTRAVENOUS
  Administered 2018-03-09: 160 mg via INTRAVENOUS

## 2018-03-09 MED ORDER — SODIUM CHLORIDE 0.9 % IV SOLN: INTRAVENOUS | Status: DC

## 2018-03-09 MED ORDER — FENTANYL CITRATE (PF) 100 MCG/2ML IJ SOLN
INTRAMUSCULAR | Status: AC
  Filled 2018-03-09: qty 2

## 2018-03-09 MED ORDER — PHENYLEPHRINE 40 MCG/ML (10ML) SYRINGE FOR IV PUSH (FOR BLOOD PRESSURE SUPPORT)
PREFILLED_SYRINGE | INTRAVENOUS | Status: DC | PRN
  Administered 2018-03-09: 80 ug via INTRAVENOUS
  Administered 2018-03-09: 40 ug via INTRAVENOUS
  Administered 2018-03-09: 80 ug via INTRAVENOUS

## 2018-03-09 MED ORDER — FENTANYL CITRATE (PF) 100 MCG/2ML IJ SOLN
INTRAMUSCULAR | Status: DC | PRN
  Administered 2018-03-09 (×2): 25 ug via INTRAVENOUS

## 2018-03-09 MED ORDER — BUPIVACAINE HCL (PF) 0.25 % IJ SOLN
INTRAMUSCULAR | Status: DC | PRN
  Administered 2018-03-09: 30 mL

## 2018-03-09 MED ORDER — POTASSIUM CHLORIDE CRYS ER 20 MEQ PO TBCR
20.0000 meq | EXTENDED_RELEASE_TABLET | Freq: Every day | ORAL | Status: DC
  Administered 2018-03-10: 20 meq via ORAL
  Filled 2018-03-09: qty 1

## 2018-03-09 MED ORDER — PROTAMINE SULFATE 10 MG/ML IV SOLN
INTRAVENOUS | Status: DC | PRN
  Administered 2018-03-09: 40 mg via INTRAVENOUS

## 2018-03-09 MED ORDER — HEPARIN (PORCINE) IN NACL 1000-0.9 UT/500ML-% IV SOLN
INTRAVENOUS | Status: AC
  Filled 2018-03-09: qty 500

## 2018-03-09 MED ORDER — MIDAZOLAM HCL 5 MG/ML IJ SOLN
INTRAMUSCULAR | Status: AC
  Filled 2018-03-09: qty 2

## 2018-03-09 MED ORDER — ROCURONIUM BROMIDE 100 MG/10ML IV SOLN
INTRAVENOUS | Status: DC | PRN
  Administered 2018-03-09: 50 mg via INTRAVENOUS
  Administered 2018-03-09: 10 mg via INTRAVENOUS

## 2018-03-09 MED ORDER — FUROSEMIDE 40 MG PO TABS
40.0000 mg | ORAL_TABLET | Freq: Every day | ORAL | Status: DC
  Administered 2018-03-10: 40 mg via ORAL
  Filled 2018-03-09: qty 1

## 2018-03-09 MED ORDER — DOFETILIDE 125 MCG PO CAPS
125.0000 ug | ORAL_CAPSULE | Freq: Two times a day (BID) | ORAL | Status: DC
  Administered 2018-03-09 – 2018-03-10 (×2): 125 ug via ORAL
  Filled 2018-03-09 (×2): qty 1

## 2018-03-09 MED ORDER — SODIUM CHLORIDE 0.9% FLUSH: 3.0000 mL | INTRAVENOUS | Status: DC | PRN

## 2018-03-09 MED ORDER — LIDOCAINE HCL (CARDIAC) PF 100 MG/5ML IV SOSY
PREFILLED_SYRINGE | INTRAVENOUS | Status: DC | PRN
  Administered 2018-03-09: 40 mg via INTRAVENOUS

## 2018-03-09 MED ORDER — ONDANSETRON HCL 4 MG/2ML IJ SOLN
INTRAMUSCULAR | Status: DC | PRN
  Administered 2018-03-09: 4 mg via INTRAVENOUS

## 2018-03-09 MED ORDER — DIPHENHYDRAMINE HCL 50 MG/ML IJ SOLN
INTRAMUSCULAR | Status: AC
  Filled 2018-03-09: qty 1

## 2018-03-09 MED ORDER — SODIUM CHLORIDE 0.9 % IV SOLN
INTRAVENOUS | Status: DC
  Administered 2018-03-09: 08:00:00 via INTRAVENOUS

## 2018-03-09 MED ORDER — HEPARIN SODIUM (PORCINE) 1000 UNIT/ML IJ SOLN
INTRAMUSCULAR | Status: DC | PRN
  Administered 2018-03-09: 12000 [IU] via INTRAVENOUS
  Administered 2018-03-09 (×2): 1000 [IU] via INTRAVENOUS

## 2018-03-09 MED ORDER — BUPIVACAINE HCL (PF) 0.25 % IJ SOLN
INTRAMUSCULAR | Status: AC
  Filled 2018-03-09: qty 30

## 2018-03-09 MED ORDER — ONDANSETRON HCL 4 MG/2ML IJ SOLN: 4.0000 mg | Freq: Four times a day (QID) | INTRAMUSCULAR | Status: DC | PRN

## 2018-03-09 MED ORDER — ACETAMINOPHEN 325 MG PO TABS
650.0000 mg | ORAL_TABLET | ORAL | Status: DC | PRN
  Administered 2018-03-09: 650 mg via ORAL
  Filled 2018-03-09: qty 2

## 2018-03-09 MED ORDER — EPHEDRINE SULFATE 50 MG/ML IJ SOLN
INTRAMUSCULAR | Status: DC | PRN
  Administered 2018-03-09 (×2): 5 mg via INTRAVENOUS

## 2018-03-09 MED ORDER — HEPARIN SODIUM (PORCINE) 1000 UNIT/ML IJ SOLN
INTRAMUSCULAR | Status: DC | PRN
  Administered 2018-03-09 (×3): 5000 [IU] via INTRAVENOUS

## 2018-03-09 MED ORDER — HYDROCODONE-ACETAMINOPHEN 5-325 MG PO TABS
1.0000 | ORAL_TABLET | ORAL | Status: DC | PRN
  Filled 2018-03-09: qty 2

## 2018-03-09 MED ORDER — SODIUM CHLORIDE 0.9 % IV SOLN: 250.0000 mL | INTRAVENOUS | Status: DC | PRN

## 2018-03-09 MED ORDER — APIXABAN 5 MG PO TABS
5.0000 mg | ORAL_TABLET | Freq: Two times a day (BID) | ORAL | Status: DC
  Administered 2018-03-09 – 2018-03-10 (×2): 5 mg via ORAL
  Filled 2018-03-09 (×3): qty 1

## 2018-03-09 MED ORDER — MIDAZOLAM HCL 10 MG/2ML IJ SOLN
INTRAMUSCULAR | Status: DC | PRN
  Administered 2018-03-09: 1 mg via INTRAVENOUS
  Administered 2018-03-09: 2 mg via INTRAVENOUS

## 2018-03-09 SURGICAL SUPPLY — 17 items
BLANKET WARM UNDERBOD FULL ACC (MISCELLANEOUS) ×2 IMPLANT
CATH MAPPNG PENTARAY F 2-6-2MM (CATHETERS) ×1 IMPLANT
CATH NAVISTAR SMARTTOUCH DF (ABLATOR) ×2 IMPLANT
CATH SOUNDSTAR 3D IMAGING (CATHETERS) ×2 IMPLANT
CATH WEBSTER BI DIR CS D-F CRV (CATHETERS) ×2 IMPLANT
COVER SWIFTLINK CONNECTOR (BAG) ×2 IMPLANT
NEEDLE BAYLIS TRANSSEPTAL 71CM (NEEDLE) ×2 IMPLANT
PACK EP LATEX FREE (CUSTOM PROCEDURE TRAY) ×1
PACK EP LF (CUSTOM PROCEDURE TRAY) ×1 IMPLANT
PAD PRO RADIOLUCENT 2001M-C (PAD) ×2 IMPLANT
PATCH CARTO3 (PAD) ×2 IMPLANT
PENTARAY F 2-6-2MM (CATHETERS) ×2
SHEATH AVANTI 11F 11CM (SHEATH) ×2 IMPLANT
SHEATH PINNACLE 7F 10CM (SHEATH) ×4 IMPLANT
SHEATH PINNACLE 9F 10CM (SHEATH) ×2 IMPLANT
SHEATH SWARTZ TS SL2 63CM 8.5F (SHEATH) ×2 IMPLANT
TUBING SMART ABLATE COOLFLOW (TUBING) ×4 IMPLANT

## 2018-03-09 NOTE — Progress Notes (Addendum)
Site area: Right groin a 7,9, and 11 french venous sheath was removed  Site Prior to Removal:  Level 0  Pressure Applied For 20 MINUTES    Bedrest Beginning at 1600p  Manual:   Yes.    Patient Status During Pull:  stable  Post Pull Groin Site:  Level 0  Post Pull Instructions Given:  Yes.    Post Pull Pulses Present:  Yes.    Dressing Applied:  Yes.    Comments:  VS remain stable

## 2018-03-09 NOTE — Progress Notes (Signed)
Office: [phone]  /  Fax: [phone]   HPI:   Chief Complaint: OBESITY Christie Atkinson is here to discuss her progress with her obesity treatment plan. She is on the Category 2 plan and is following her eating plan approximately 99 % of the time. She states she is exercising 0 minutes 0 times per week. Christie Atkinson had a rough day yesterday, her a-fib started up again while she was at her primary care visit. She occasionally gets busy and has long stretches without eating. She notes her hunger is controlled. She is doing senior center meals daily and not necessarily following the plan per say. Her weight is 232 lb (105.2 kg) today and has had a weight loss of 4 pounds over a period of 2 weeks since her last visit. She has lost 17 lbs since starting treatment with [name].  Vitamin D Deficiency Christie Atkinson has a diagnosis of vitamin D deficiency. She is currently taking prescription Vit D. She notes fatigue and denies nausea, vomiting or muscle weakness.  Atrial Fibrillation Christie Atkinson's heart is in and out of rhythm. She is seeing [name] today. She is on Tikosyn, Cardizem, as well as Eliquis.  ALLERGIES: Allergies  Allergen Reactions  . Other Other (See Comments)    Metals Polyester - including hospital gowns and sheets - allergic contact dermatitis  . Sulfur Hives    MEDICATIONS: No current facility-administered medications on file prior to visit.    Current Outpatient Medications on File Prior to Visit  Medication Sig Dispense Refill  . acetaminophen (TYLENOL) 325 MG tablet Take 325 mg by mouth every 6 (six) hours as needed for moderate pain or headache.    [name] apixaban (ELIQUIS) 5 MG TABS tablet Take 1 tablet (5 mg total) by mouth 2 (two) times daily. 60 tablet 1  . diltiazem (CARDIZEM CD) 120 MG 24 hr capsule Take 1 capsule (120 mg total) by mouth 2 (two) times daily. 180 capsule 2  . dofetilide (TIKOSYN) 125 MCG capsule Take 1 capsule (125 mcg total) by mouth 2 (two) times daily. 180 capsule  2  . furosemide (LASIX) 40 MG tablet Take 1 tablet (40 mg total) by mouth daily. May take an additional 1/2 tablet (20mg ) daily if needed for fluid retention.    . Hyaluronic Acid 20 MG/ML GEL Inject 20 mg as directed every 6 (six) months.     . Hyaluronic Acid 20-60 MG CAPS Take 20 mg by mouth daily.    . magnesium oxide (MAG-OX) 400 (241.3 Mg) MG tablet Take 1 tablet (400 mg total) by mouth daily. 30 tablet 6  . Multiple Vitamin (MULTIVITAMIN WITH MINERALS) TABS tablet Take 1 tablet by mouth daily.     . polyethylene glycol powder (GLYCOLAX/MIRALAX) powder Take 17 g by mouth daily. 3350 g 0  . potassium chloride SA (K-DUR,KLOR-CON) 20 MEQ tablet Take 1 tablet (20 mEq total) by mouth daily. Take an extra 1/2 tablet by mouth on days when you take the extra fluid pill (furosemide/Lasix).      PAST MEDICAL HISTORY: Past Medical History:  Diagnosis Date  . Asthma   . Atrial fibrillation (Christie Atkinson)   . Back pain   . Diastolic dysfunction   . Fibromyalgia   . Gestational diabetes   . Knee pain   . Osteoarthritis   . Persistent atrial fibrillation   . Sleep apnea   . Visit for monitoring Tikosyn therapy 02/07/2018    PAST SURGICAL HISTORY: Past Surgical History:  Procedure Laterality Date  . ABDOMINAL HYSTERECTOMY    .  APPENDECTOMY    . CARDIOVERSION N/A 07/04/2017   Procedure: CARDIOVERSION;  Surgeon: [name], MD;  Location: [facility] ENDOSCOPY;  Service: Cardiovascular;  Laterality: N/A;  . CARDIOVERSION N/A 09/06/2017   Procedure: CARDIOVERSION;  Surgeon: [name], MD;  Location: [facility] ENDOSCOPY;  Service: Cardiovascular;  Laterality: N/A;  . CHOLECYSTECTOMY N/A 02/24/2015   Procedure: LAPAROSCOPIC CHOLECYSTECTOMY;  Surgeon: [name], MD;  Location: [place];  Service: General;  Laterality: N/A;  . RIGHT/LEFT HEART CATH AND CORONARY ANGIOGRAPHY N/A 08/09/2017   Procedure: RIGHT/LEFT HEART CATH AND CORONARY ANGIOGRAPHY;  Surgeon: [name], MD;  Location: [place] CV LAB;   Service: Cardiovascular;  Laterality: N/A;  . TEE WITHOUT CARDIOVERSION N/A 07/04/2017   Procedure: TRANSESOPHAGEAL ECHOCARDIOGRAM (TEE);  Surgeon: [name], MD;  Location: [facility] ENDOSCOPY;  Service: Cardiovascular;  Laterality: N/A;    SOCIAL HISTORY: Social History   Tobacco Use  . Smoking status: Never Smoker  . Smokeless tobacco: Never Used  Substance Use Topics  . Alcohol use: Yes    Comment: little  . Drug use: No    FAMILY HISTORY: Family History  Problem Relation Age of Onset  . Heart disease Mother   . Hypertension Mother   . Hyperlipidemia Mother   . Diabetes Mother   . Sudden death Mother   . Thyroid disease Mother   . Obesity Mother   . Heart disease Father   . Heart attack Father   . Hypertension Father   . Hyperlipidemia Father   . Diabetes Father     ROS: Review of Systems  Constitutional: Positive for malaise/fatigue and weight loss.  Gastrointestinal: Negative for nausea and vomiting.  Musculoskeletal:       Negative muscle weakness    PHYSICAL EXAM: Blood pressure 100/67, pulse (!) 51, temperature (!) 97.4 F (36.3 C), temperature source Oral, height 5\' 5"  (1.651 m), weight 232 lb (105.2 kg), SpO2 96 %. Body mass index is 38.61 kg/m. Physical Exam  Constitutional: She is oriented to person, place, and time. She appears well-developed and well-nourished.  Cardiovascular: Normal rate.  Pulmonary/Chest: Effort normal.  Musculoskeletal: Normal range of motion.  Neurological: She is oriented to person, place, and time.  Skin: Skin is warm and dry.  Psychiatric: She has a normal mood and affect. Her behavior is normal.  Vitals reviewed.   RECENT LABS AND TESTS: BMET    Component Value Date/Time   NA 141 03/08/2018 1442   K 4.3 03/08/2018 1442   CL 102 03/08/2018 1442   CO2 23 03/08/2018 1442   GLUCOSE 91 03/08/2018 1442   GLUCOSE 116 (H) 02/21/2018 1025   BUN 22 03/08/2018 1442   CREATININE 0.96 03/08/2018 1442   CALCIUM 9.5  03/08/2018 1442   GFRNONAA 63 03/08/2018 1442   GFRAA 73 03/08/2018 1442   Lab Results  Component Value Date   HGBA1C 5.8 (H) 11/02/2017   HGBA1C 5.8 (H) 06/26/2017   Lab Results  Component Value Date   INSULIN 8.5 11/02/2017   CBC    Component Value Date/Time   WBC 8.5 03/08/2018 1442   WBC 7.4 08/31/2017 0930   RBC 4.46 03/08/2018 1442   RBC 4.63 08/31/2017 0930   HGB 14.0 03/08/2018 1442   HCT 42.2 03/08/2018 1442   PLT 256 03/08/2018 1442   MCV 95 03/08/2018 1442   MCH 31.4 03/08/2018 1442   MCH 30.2 08/31/2017 0930   MCHC 33.2 03/08/2018 1442   MCHC 33.2 08/31/2017 0930   RDW 13.4  03/08/2018 1442   LYMPHSABS 2.5 03/08/2018 1442   MONOABS 1.2 (H) 02/23/2015 0619   EOSABS 0.1 03/08/2018 1442   BASOSABS 0.0 03/08/2018 1442   Iron/TIBC/Ferritin/ %Sat No results found for: IRON, TIBC, FERRITIN, IRONPCTSAT Lipid Panel     Component Value Date/Time   CHOL 158 11/02/2017 1304   TRIG 151 (H) 11/02/2017 1304   HDL 30 (L) 11/02/2017 1304   CHOLHDL 5.4 06/26/2017 0517   VLDL 15 06/26/2017 0517   LDLCALC 98 11/02/2017 1304   Hepatic Function Panel     Component Value Date/Time   PROT 7.7 11/02/2017 1254   ALBUMIN 4.2 11/02/2017 1254   AST 22 11/02/2017 1254   ALT 18 11/02/2017 1254   ALKPHOS 75 11/02/2017 1254   BILITOT 0.8 11/02/2017 1254      Component Value Date/Time   TSH 3.010 11/02/2017 1254   TSH 4.410 06/24/2017 1046   TSH 1.260 02/22/2015 1954    ASSESSMENT AND PLAN: Vitamin D deficiency - Plan: Vitamin D, Ergocalciferol, (DRISDOL) 50000 units CAPS capsule  Chronic atrial fibrillation  Class 2 severe obesity with serious comorbidity and body mass index (BMI) of 38.0 to 38.9 in adult, unspecified obesity type (HCC)  PLAN:  Vitamin D Deficiency Christie Atkinson was informed that low vitamin D levels contributes to fatigue and are associated with obesity, breast, and colon cancer. Christie Atkinson agrees to continue taking prescription Vit D @50 ,000 IU every week  #4 and we will refill for 1 month. She will follow up for routine testing of vitamin D, at least 2-3 times per year. She was informed of the risk of over-replacement of vitamin D and agrees to not increase her dose unless she discusses this with [name] first. Christie Atkinson agrees to follow up with our clinic in 2 weeks.  Atrial Fibrillation Christie Atkinson is to follow up with [name] today as scheduled, and she agrees to follow up with our clinic in 2 weeks.  Obesity Christie Atkinson is currently in the action stage of change. As such, her goal is to continue with weight loss efforts She has agreed to keep a food journal with 1150-1300 calories and 80+ grams of protein daily Christie Atkinson has been instructed to work up to a goal of 150 minutes of combined cardio and strengthening exercise per week for weight loss and overall health benefits. We discussed the following Behavioral Modification Strategies today: increasing lean protein intake, increasing vegetables, work on meal planning and easy cooking plans, planning for success, and keep a strict food journal   Christie Atkinson has agreed to follow up with our clinic in 2 weeks. She was informed of the importance of frequent follow up visits to maximize her success with intensive lifestyle modifications for her multiple health conditions.   OBESITY BEHAVIORAL INTERVENTION VISIT  Today's visit was # 7   Starting weight: 249 lbs Starting date: 11/02/17 Today's weight : 232 lbs Today's date: 03/08/2018 Total lbs lost to date: 17 At least 15 minutes were spent on discussing the following behavioral intervention visit.   ASK: We discussed the diagnosis of obesity with Christie Atkinson today and Christie Atkinson agreed to give [name] permission to discuss obesity behavioral modification therapy today.  ASSESS: Christie Atkinson has the diagnosis of obesity and her BMI today is 38.61 Christie Atkinson is in the action stage of change   ADVISE: Christie Atkinson was educated on the multiple health risks of obesity as  well as the benefit of weight loss to improve her health. She was advised of the need for long term  treatment and the importance of lifestyle modifications to improve her current health and to decrease her risk of future health problems.  AGREE: Multiple dietary modification options and treatment options were discussed and  Christie Atkinson agreed to follow the recommendations documented in the above note.  ARRANGE: Christie Atkinson was educated on the importance of frequent visits to treat obesity as outlined per CMS and USPSTF guidelines and agreed to schedule her next follow up appointment today.  I, Trixie Dredge, am acting as transcriptionist for Ilene Qua, MD  I have reviewed the above documentation for accuracy and completeness, and I agree with the above. - Ilene Qua, MD

## 2018-03-09 NOTE — Anesthesia Procedure Notes (Signed)
Procedure Name: Intubation Date/Time: 03/09/2018 11:49 AM Performed by: Glynda Jaeger, CRNA Pre-anesthesia Checklist: Emergency Drugs available, Suction available, Patient being monitored and Patient identified Patient Re-evaluated:Patient Re-evaluated prior to induction Oxygen Delivery Method: Circle system utilized Preoxygenation: Pre-oxygenation with 100% oxygen Induction Type: IV induction Ventilation: Oral airway inserted - appropriate to patient size and Mask ventilation without difficulty Laryngoscope Size: Miller and 2 Grade View: Grade I Tube type: Oral Tube size: 7.5 mm Number of attempts: 1 Airway Equipment and Method: Stylet Placement Confirmation: ETT inserted through vocal cords under direct vision,  positive ETCO2 and breath sounds checked- equal and bilateral Secured at: 22 cm Tube secured with: Tape Dental Injury: Teeth and Oropharynx as per pre-operative assessment

## 2018-03-09 NOTE — Progress Notes (Signed)
Endoscopy recovery complete after TEE. Patient holding in Endo at this time awaiting ablation. Monitoring discontinued and IV saline locked.

## 2018-03-09 NOTE — CV Procedure (Signed)
    Transesophageal Echocardiogram Note  RUTHETTA KOOPMANN [id number] Oct 18, 1955  Procedure: Transesophageal Echocardiogram Indications: atrial fib   Procedure Details Consent: Obtained Time Out: Verified patient identification, verified procedure, site/side was marked, verified correct patient position, special equipment/implants available, Radiology Safety Procedures followed,  medications/allergies/relevent history reviewed, required imaging and test results available.  Performed  Medications:  During this procedure the patient is administered a total of Versed 3 mg and Fentanyl 50  mcg  to achieve and maintain moderate conscious sedation.  The patient's heart rate, blood pressure, and oxygen saturation are monitored continuously during the procedure. The period of conscious sedation is 30  minutes, of which I was present face-to-face 100% of this time.  Left Ventrical:  Mildly decreased LV systolic function.   EF 40-45%  Mitral Valve: mild MR , no vegetations   Aortic Valve: normal   Tricuspid Valve: mild TR   Pulmonic Valve: trace PI   Left Atrium/ Left atrial appendage: no thrombi in the LAA or LA   Atrial septum: no evidence of PFO by bubble study or color doppler   Aorta: minimal plaque.     Complications: No apparent complications Patient did tolerate procedure well.   Thayer Headings, Brooke Bonito., MD, [facility] 03/09/2018, 8:40 AM

## 2018-03-09 NOTE — Transfer of Care (Signed)
Immediate Anesthesia Transfer of Care Note  Patient: Christie Atkinson  Procedure(s) Performed: ATRIAL FIBRILLATION ABLATION (N/A )  Patient Location: Cath Lab  Anesthesia Type:General  Level of Consciousness: awake, alert , oriented, patient cooperative and responds to stimulation  Airway & Oxygen Therapy: Patient Spontanous Breathing and Patient connected to nasal cannula oxygen  Post-op Assessment: Report given to RN and Post -op Vital signs reviewed and stable  Post vital signs: Reviewed and stable  Last Vitals:  Vitals Value Taken Time  BP 98/53 03/09/2018  2:48 PM  Temp    Pulse 72 03/09/2018  2:50 PM  Resp 10 03/09/2018  2:50 PM  SpO2 96 % 03/09/2018  2:50 PM  Vitals shown include unvalidated device data.  Last Pain:  Vitals:   03/09/18 1449  TempSrc:   PainSc: 0-No pain         Complications: No apparent anesthesia complications

## 2018-03-09 NOTE — Progress Notes (Deleted)
  Echocardiogram 2D Echocardiogram has been performed.  Christie Atkinson 03/09/2018, 9:23 AM

## 2018-03-09 NOTE — Progress Notes (Signed)
  Echocardiogram Echocardiogram Transesophageal has been performed.  Christie Atkinson 03/09/2018, 9:23 AM

## 2018-03-09 NOTE — Plan of Care (Signed)

## 2018-03-09 NOTE — Interval H&P Note (Signed)
History and Physical Interval Note:  03/09/2018 8:06 AM  Christie Atkinson  has presented today for surgery, with the diagnosis of afib  The various methods of treatment have been discussed with the patient and family. After consideration of risks, benefits and other options for treatment, the patient has consented to  Procedure(s): TRANSESOPHAGEAL ECHOCARDIOGRAM (TEE) (N/A) as a surgical intervention .  The patient's history has been reviewed, patient examined, no change in status, stable for surgery.  I have reviewed the patient's chart and labs.  Questions were answered to the patient's satisfaction.     Mertie Moores

## 2018-03-09 NOTE — Interval H&P Note (Signed)
History and Physical Interval Note:  03/09/2018 7:35 AM  Christie Atkinson  has presented today for surgery, with the diagnosis of afib  The various methods of treatment have been discussed with the patient and family. After consideration of risks, benefits and other options for treatment, the patient has consented to  Procedure(s): ATRIAL FIBRILLATION ABLATION (N/A) as a surgical intervention .  The patient's history has been reviewed, patient examined, no change in status, stable for surgery.  I have reviewed the patient's chart and labs.  Questions were answered to the patient's satisfaction.   She reports compliance with eliquis.  TEE this am per Dr Acie Fredrickson.  Risks of procedure discussed with patient who wishes to proceed.  [name]

## 2018-03-09 NOTE — Anesthesia Preprocedure Evaluation (Addendum)
Anesthesia Evaluation  Patient identified by MRN, date of birth, ID band Patient awake    Reviewed: Allergy & Precautions, NPO status , Patient's Chart, lab work & pertinent test results  Airway Mallampati: II  TM Distance: >3 FB Neck ROM: Full    Dental  (+) Teeth Intact, Dental Advisory Given   Pulmonary    breath sounds clear to auscultation       Cardiovascular  Rhythm:Irregular Rate:Normal     Neuro/Psych    GI/Hepatic   Endo/Other  diabetes  Renal/GU      Musculoskeletal   Abdominal   Peds  Hematology   Anesthesia Other Findings   Reproductive/Obstetrics                             Anesthesia Physical Anesthesia Plan  ASA: III  Anesthesia Plan: General   Post-op Pain Management:    Induction: Intravenous  PONV Risk Score and Plan: Ondansetron and Dexamethasone  Airway Management Planned: Oral ETT  Additional Equipment:   Intra-op Plan:   Post-operative Plan: Extubation in OR  Informed Consent: I have reviewed the patients History and Physical, chart, labs and discussed the procedure including the risks, benefits and alternatives for the proposed anesthesia with the patient or authorized representative who has indicated his/her understanding and acceptance.     Dental advisory given  Plan Discussed with: CRNA and Anesthesiologist  Anesthesia Plan Comments:         Anesthesia Quick Evaluation  

## 2018-03-10 ENCOUNTER — Encounter (HOSPITAL_COMMUNITY): Payer: Self-pay | Admitting: Internal Medicine

## 2018-03-10 DIAGNOSIS — E669 Obesity, unspecified: Secondary | ICD-10-CM | POA: Diagnosis not present

## 2018-03-10 DIAGNOSIS — J45909 Unspecified asthma, uncomplicated: Secondary | ICD-10-CM | POA: Diagnosis not present

## 2018-03-10 DIAGNOSIS — I4892 Unspecified atrial flutter: Secondary | ICD-10-CM | POA: Diagnosis not present

## 2018-03-10 DIAGNOSIS — Z885 Allergy status to narcotic agent status: Secondary | ICD-10-CM | POA: Diagnosis not present

## 2018-03-10 DIAGNOSIS — M1990 Unspecified osteoarthritis, unspecified site: Secondary | ICD-10-CM | POA: Diagnosis not present

## 2018-03-10 DIAGNOSIS — M797 Fibromyalgia: Secondary | ICD-10-CM | POA: Diagnosis not present

## 2018-03-10 DIAGNOSIS — I4819 Other persistent atrial fibrillation: Secondary | ICD-10-CM | POA: Diagnosis not present

## 2018-03-10 DIAGNOSIS — G4733 Obstructive sleep apnea (adult) (pediatric): Secondary | ICD-10-CM | POA: Diagnosis not present

## 2018-03-10 DIAGNOSIS — I5032 Chronic diastolic (congestive) heart failure: Secondary | ICD-10-CM | POA: Diagnosis not present

## 2018-03-10 DIAGNOSIS — Z7901 Long term (current) use of anticoagulants: Secondary | ICD-10-CM | POA: Diagnosis not present

## 2018-03-10 DIAGNOSIS — Z6837 Body mass index (BMI) 37.0-37.9, adult: Secondary | ICD-10-CM | POA: Diagnosis not present

## 2018-03-10 LAB — BASIC METABOLIC PANEL
Anion gap: 6 (ref 5–15)
BUN: 20 mg/dL (ref 8–23)
CO2: 24 mmol/L (ref 22–32)
CREATININE: 1.18 mg/dL — AB (ref 0.44–1.00)
Calcium: 8.1 mg/dL — ABNORMAL LOW (ref 8.9–10.3)
Chloride: 106 mmol/L (ref 98–111)
GFR calc Af Amer: 56 mL/min — ABNORMAL LOW (ref >60)
GFR, EST NON AFRICAN AMERICAN: 48 mL/min — AB (ref >60)
Glucose, Bld: 101 mg/dL — ABNORMAL HIGH (ref 70–99)
Potassium: 3.9 mmol/L (ref 3.5–5.1)
SODIUM: 136 mmol/L (ref 135–145)

## 2018-03-10 LAB — MAGNESIUM: MAGNESIUM: 2 mg/dL (ref 1.7–2.4)

## 2018-03-10 MED ORDER — PANTOPRAZOLE SODIUM 40 MG PO TBEC: 40.0000 mg | DELAYED_RELEASE_TABLET | Freq: Every day | ORAL | 0 refills | Status: DC

## 2018-03-10 NOTE — Progress Notes (Signed)
Patient received discharge information and acknowledged understanding of it. Patient IV was removed.  

## 2018-03-10 NOTE — Anesthesia Postprocedure Evaluation (Signed)
Anesthesia Post Note  Patient: Christie Atkinson  Procedure(s) Performed: ATRIAL FIBRILLATION ABLATION (N/A )     Patient location during evaluation: PACU Anesthesia Type: General Level of consciousness: awake and alert Pain management: pain level controlled Vital Signs Assessment: post-procedure vital signs reviewed and stable Respiratory status: spontaneous breathing, nonlabored ventilation, respiratory function stable and patient connected to nasal cannula oxygen Cardiovascular status: blood pressure returned to baseline and stable Postop Assessment: no apparent nausea or vomiting Anesthetic complications: no    Last Vitals:  Vitals:   03/09/18 1947 03/10/18 0445  BP:  (!) 109/59  Pulse: 81 67  Resp:  12  Temp: 36.9 C 36.7 C  SpO2: 97% 98%    Last Pain:  Vitals:   03/10/18 0445  TempSrc: Oral  PainSc:                  Pace S

## 2018-03-10 NOTE — Discharge Summary (Addendum)
ELECTROPHYSIOLOGY PROCEDURE DISCHARGE SUMMARY    Patient ID: Christie Atkinson,  MRN: [id number], DOB/AGE: Mar 26, 1956 62 y.o.  Admit date: 03/09/2018 Discharge date: 03/10/2018  Primary Care Physician: Patient, No Pcp Per  Primary Cardiologist: Dr. Tamala Julian Electrophysiologist: [name], MD  Primary Discharge Diagnosis:  1. Persistent AFib     CHA2DS2Vasc is one on Eliquis, appropriately dosed  Secondary Discharge Diagnosis:  1. Chronic CHF (diastolic) 2. Fibromyalgia 3. Obesity 4. OSA (untreated)  Procedures This Admission:  1.  Electrophysiology study and radiofrequency catheter ablation on 03/09/18 by Dr [name].  This study demonstrated  CONCLUSIONS: 1. Atrial fibrillation upon presentation.  2. Intracardiac echo reveals a moderate to large sized left atrium. 3. Successful electrical isolation and anatomical encircling of all four pulmonary veins with radiofrequency current. 4. Additional mapping and ablation within the left atrium due to persistence of atrial fibrillation with a posterior wall box demonstrated  5. Atrial fibrillation successfully cardioverted to sinus rhythm. 6. Cavo-tricuspid isthmus ablation performed with complete bidirectional isthmus block achieved 7. No inducible arrhythmias following ablation 8. No early apparent complications    Brief HPI: Christie Atkinson is a 62 y.o. female with a history of persistent atrial fibrillation. She has failed medical therapy with Tikosyn. Risks, benefits, and alternatives to catheter ablation of atrial fibrillation were reviewed with the patient who wished to proceed.  The patient underwent TEE prior to the procedure which demonstrated LVEF 40-45% and no LAA thrombus.    Hospital Course:  The patient was admitted and underwent EPS/RFCA of atrial fibrillation with details as outlined above.  She was monitored on telemetry overnight which demonstrated SR, brief PATs.  R groin was without complication on the day  of discharge.  The patient feels well, no CP or SOB, no procedure site pain, she was examined by [name] and considered to be stable for discharge.  Wound care and restrictions were reviewed with the patient.  The patient will be seen back by Roderic Palau, NP in 4 weeks and Dr [name] in 12 weeks for post ablation follow up.     Physical Exam: Vitals:   03/09/18 1730 03/09/18 1946 03/09/18 1947 03/10/18 0445  BP: 116/72 (!) 117/56  (!) 109/59  Pulse: 81 78 81 67  Resp: 10 17  12   Temp:   98.4 F (36.9 C) 98.1 F (36.7 C)  TempSrc:   Oral Oral  SpO2: 98% 97% 97% 98%  Weight:    111.5 kg  Height:        GEN- The patient is well appearing, alert and oriented x 3 today.   HEENT: normocephalic, atraumatic; sclera clear, conjunctiva pink; hearing intact; oropharynx clear; neck supple  Lungs- CTA b/l, normal work of breathing.  No wheezes, rales, rhonchi Heart- Regular rate and rhythm, no murmurs, rubs or gallops  GI- soft, non-tender, non-distended Extremities- no clubbing, cyanosis, or edema; R DP/PT 2+ bilaterally, R groin without hematoma/bruit/ecchymosis MS- no significant deformity or atrophy Skin- warm and dry, no rash or lesion Psych- euthymic mood, full affect Neuro- strength and sensation are intact   Labs:   Lab Results  Component Value Date   WBC 8.5 03/08/2018   HGB 14.0 03/08/2018   HCT 42.2 03/08/2018   MCV 95 03/08/2018   PLT 256 03/08/2018    Recent Labs  Lab 03/10/18 0508  NA 136  K 3.9  CL 106  CO2 24  BUN 20  CREATININE 1.18*  CALCIUM 8.1*  GLUCOSE 101*  Discharge Medications:  Allergies as of 03/10/2018      Reactions   Hydrocodone-acetaminophen Nausea And Vomiting   Other Other (See Comments)   Metals Polyester - including hospital gowns and sheets - allergic contact dermatitis   Sulfur Hives      Medication List    TAKE these medications   acetaminophen 325 MG tablet Commonly known as:  TYLENOL Take 325 mg by mouth every 6  (six) hours as needed for moderate pain or headache.   apixaban 5 MG Tabs tablet Commonly known as:  ELIQUIS Take 1 tablet (5 mg total) by mouth 2 (two) times daily.   diltiazem 120 MG 24 hr capsule Commonly known as:  CARDIZEM CD Take 1 capsule (120 mg total) by mouth 2 (two) times daily.   dofetilide 125 MCG capsule Commonly known as:  TIKOSYN Take 1 capsule (125 mcg total) by mouth 2 (two) times daily.   furosemide 40 MG tablet Commonly known as:  LASIX Take 1 tablet (40 mg total) by mouth daily. May take an additional 1/2 tablet (20mg ) daily if needed for fluid retention.   Hyaluronic Acid 20 MG/ML Gel Inject 20 mg as directed every 6 (six) months.   Hyaluronic Acid 20-60 MG Caps Take 20 mg by mouth daily.   magnesium oxide 400 (241.3 Mg) MG tablet Commonly known as:  MAG-OX Take 1 tablet (400 mg total) by mouth daily.   multivitamin with minerals Tabs tablet Take 1 tablet by mouth daily.   pantoprazole 40 MG tablet Commonly known as:  PROTONIX Take 1 tablet (40 mg total) by mouth daily.   polyethylene glycol powder powder Commonly known as:  GLYCOLAX/MIRALAX Take 17 g by mouth daily.   potassium chloride SA 20 MEQ tablet Commonly known as:  K-DUR,KLOR-CON Take 1 tablet (20 mEq total) by mouth daily. Take an extra 1/2 tablet by mouth on days when you take the extra fluid pill (furosemide/Lasix).   Vitamin D (Ergocalciferol) 50000 units Caps capsule Commonly known as:  DRISDOL TAKE 1 CAPSULE BY MOUTH EVERY 7 DAYS       Disposition:  Home  Discharge Instructions    Diet - low sodium heart healthy   Complete by:  As directed    Increase activity slowly   Complete by:  As directed      Follow-up Information    [facility] Follow up on 04/13/2018.   Specialty:  Cardiology Why:  11:00AM Contact information: [address] [phone] [place] [phone]       [name], MD Follow up on  06/12/2018.   Specialty:  Cardiology Why:  2:15PM Contact information: [place] [address] [phone]           Duration of Discharge Encounter: Greater than 30 minutes including physician time.  SignedTommye Standard, PA-C 03/10/2018 8:48 AM  I have seen, examined the patient, and reviewed the above assessment and plan.  Changes to above are made where necessary.  On exam, RRR.  DC to home.   Routine wound care and follow-up  Co Sign: [name], MD 03/10/2018 1:50 PM

## 2018-03-10 NOTE — Discharge Instructions (Signed)
Post procedure care instructions No driving for 4 days. No lifting over 5 lbs for 1 week. No vigorous or sexual activity for 1 week. You may return to work on 03/16/18. Keep procedure site clean & dry. If you notice increased pain, swelling, bleeding or pus, call/return!  You may shower, but no soaking baths/hot tubs/pools for 1 week.   You have an appointment set up with the [facility].  Multiple studies have shown that being followed by a dedicated atrial fibrillation clinic in addition to the standard care you receive from your other physicians improves health. We believe that enrollment in the atrial fibrillation clinic will allow [name] to better care for you.   The phone number to the [facility] is [phone]. The clinic is staffed Monday through Friday from 8:30am to 5pm.  Parking Directions: The clinic is located in the Heart and Vascular Building connected to [facility]. 1)From [address] turn on to [address] and go to the 3rd entrance  (Heart and Vascular entrance) on the right. 2)Look to the right for Heart &Vascular Parking Garage. 3)A code for the entrance is required please call the clinic to receive this.   4)Take the elevators to the 1st floor. Registration is in the room with the glass walls at the end of the hallway.  If you have any trouble parking or locating the clinic, please dont hesitate to call [phone].   TEE  YOU HAD AN CARDIAC PROCEDURE TODAY: Refer to the procedure report and other information in the discharge instructions given to you for any specific questions about what was found during the examination. If this information does not answer your questions, please call [facility] office at [phone] to clarify.   DIET: Your first meal following the procedure should be a light meal and then it is ok to progress to your normal diet. A half-sandwich or bowl of soup is an example of a good first meal. Heavy or  fried foods are harder to digest and may make you feel nauseous or bloated. Drink plenty of fluids but you should avoid alcoholic beverages for 24 hours. If you had a esophageal dilation, please see attached instructions for diet.   ACTIVITY: Your care partner should take you home directly after the procedure. You should plan to take it easy, moving slowly for the rest of the day. You can resume normal activity the day after the procedure however YOU SHOULD NOT DRIVE, use power tools, machinery or perform tasks that involve climbing or major physical exertion for 24 hours (because of the sedation medicines used during the test).   SYMPTOMS TO REPORT IMMEDIATELY: A cardiologist can be reached at any hour. Please call [phone] for any of the following symptoms:  Vomiting of blood or coffee ground material  New, significant abdominal pain  New, significant chest pain or pain under the shoulder blades  Painful or persistently difficult swallowing  New shortness of breath  Black, tarry-looking or red, bloody stools  FOLLOW UP:  Please also call with any specific questions about appointments or follow up tests.      Information on my medicine - ELIQUIS (apixaban)  Why was Eliquis prescribed for you? Eliquis was prescribed for you to reduce the risk of a blood clot forming that can cause a stroke if you have a medical condition called atrial fibrillation (a type of irregular heartbeat).  What do You need to know about Eliquis ? Take your Eliquis TWICE DAILY - one  tablet in the morning and one tablet in the evening with or without food. If you have difficulty swallowing the tablet whole please discuss with your pharmacist how to take the medication safely.  Take Eliquis exactly as prescribed by your doctor and DO NOT stop taking Eliquis without talking to the doctor who prescribed the medication.  Stopping may increase your risk of developing a stroke.  Refill your prescription before  you run out.  After discharge, you should have regular check-up appointments with your healthcare provider that is prescribing your Eliquis.  In the future your dose may need to be changed if your kidney function or weight changes by a significant amount or as you get older.  What do you do if you miss a dose? If you miss a dose, take it as soon as you remember on the same day and resume taking twice daily.  Do not take more than one dose of ELIQUIS at the same time to make up a missed dose.  Important Safety Information A possible side effect of Eliquis is bleeding. You should call your healthcare provider right away if you experience any of the following: ? Bleeding from an injury or your nose that does not stop. ? Unusual colored urine (red or dark brown) or unusual colored stools (red or black). ? Unusual bruising for unknown reasons. ? A serious fall or if you hit your head (even if there is no bleeding).  Some medicines may interact with Eliquis and might increase your risk of bleeding or clotting while on Eliquis. To help avoid this, consult your healthcare provider or pharmacist prior to using any new prescription or non-prescription medications, including herbals, vitamins, non-steroidal anti-inflammatory drugs (NSAIDs) and supplements.  This website has more information on Eliquis (apixaban): http://www.eliquis.com/eliquis/home

## 2018-03-11 ENCOUNTER — Other Ambulatory Visit (INDEPENDENT_AMBULATORY_CARE_PROVIDER_SITE_OTHER): Payer: Self-pay | Admitting: Family Medicine

## 2018-03-11 DIAGNOSIS — E559 Vitamin D deficiency, unspecified: Secondary | ICD-10-CM

## 2018-03-21 ENCOUNTER — Ambulatory Visit (INDEPENDENT_AMBULATORY_CARE_PROVIDER_SITE_OTHER): Payer: Medicare Other | Admitting: Family Medicine

## 2018-03-29 ENCOUNTER — Ambulatory Visit (INDEPENDENT_AMBULATORY_CARE_PROVIDER_SITE_OTHER): Payer: Medicare Other | Admitting: Family Medicine

## 2018-03-29 VITALS — BP 102/62 | HR 77 | Temp 97.8°F | Ht 65.0 in | Wt 227.0 lb

## 2018-03-29 DIAGNOSIS — I4891 Unspecified atrial fibrillation: Secondary | ICD-10-CM | POA: Diagnosis not present

## 2018-03-29 DIAGNOSIS — Z6837 Body mass index (BMI) 37.0-37.9, adult: Secondary | ICD-10-CM

## 2018-03-29 DIAGNOSIS — E559 Vitamin D deficiency, unspecified: Secondary | ICD-10-CM

## 2018-03-29 MED ORDER — VITAMIN D (ERGOCALCIFEROL) 1.25 MG (50000 UNIT) PO CAPS: ORAL_CAPSULE | ORAL | 0 refills | Status: DC

## 2018-04-03 NOTE — Progress Notes (Signed)
Office: [phone]  /  Fax: [phone]   HPI:   Chief Complaint: OBESITY Christie Atkinson is here to discuss her progress with her obesity treatment plan. She is on the keep a food journal with 1150-1300 calories and 80+ grams of protein daily and is following her eating plan approximately 90 % of the time. She states she is exercising 0 minutes 0 times per week. [name] incorporating more protein and she is working hard to get 80 grams of protein. She had cardiac ablation between the last two appointments.  Her weight is 227 lb (103 kg) today and has had a weight loss of 5 pounds over a period of 3 weeks since her last visit. She has lost 22 lbs since starting treatment with [name].  Vitamin D Deficiency Christie Atkinson has a diagnosis of vitamin D deficiency. She is currently taking prescription Vit D. She notes fatigue and denies nausea, vomiting or muscle weakness.  Atrial Fibrillation Christie Atkinson had recent ablation and she is still taking Tikosyn and Cardizem.  ALLERGIES: Allergies  Allergen Reactions  . Hydrocodone-Acetaminophen Nausea And Vomiting  . Other Other (See Comments)    Metals Polyester - including hospital gowns and sheets - allergic contact dermatitis  . Sulfur Hives    MEDICATIONS: Current Outpatient Medications on File Prior to Visit  Medication Sig Dispense Refill  . acetaminophen (TYLENOL) 325 MG tablet Take 325 mg by mouth every 6 (six) hours as needed for moderate pain or headache.    [name] apixaban (ELIQUIS) 5 MG TABS tablet Take 1 tablet (5 mg total) by mouth 2 (two) times daily. 60 tablet 1  . diltiazem (CARDIZEM CD) 120 MG 24 hr capsule Take 1 capsule (120 mg total) by mouth 2 (two) times daily. 180 capsule 2  . dofetilide (TIKOSYN) 125 MCG capsule Take 1 capsule (125 mcg total) by mouth 2 (two) times daily. 180 capsule 2  . furosemide (LASIX) 40 MG tablet Take 1 tablet (40 mg total) by mouth daily. May take an additional 1/2 tablet (20mg ) daily if needed for fluid  retention.    . Hyaluronic Acid 20 MG/ML GEL Inject 20 mg as directed every 6 (six) months.     . Hyaluronic Acid 20-60 MG CAPS Take 20 mg by mouth daily.    . magnesium oxide (MAG-OX) 400 (241.3 Mg) MG tablet Take 1 tablet (400 mg total) by mouth daily. 30 tablet 6  . Multiple Vitamin (MULTIVITAMIN WITH MINERALS) TABS tablet Take 1 tablet by mouth daily.     . pantoprazole (PROTONIX) 40 MG tablet Take 1 tablet (40 mg total) by mouth daily. 45 tablet 0  . polyethylene glycol powder (GLYCOLAX/MIRALAX) powder Take 17 g by mouth daily. 3350 g 0  . potassium chloride SA (K-DUR,KLOR-CON) 20 MEQ tablet Take 1 tablet (20 mEq total) by mouth daily. Take an extra 1/2 tablet by mouth on days when you take the extra fluid pill (furosemide/Lasix).     No current facility-administered medications on file prior to visit.     PAST MEDICAL HISTORY: Past Medical History:  Diagnosis Date  . Asthma   . Atrial fibrillation ([place])   . Back pain   . Diastolic dysfunction   . Fibromyalgia   . Gestational diabetes   . Knee pain   . Osteoarthritis   . Persistent atrial fibrillation   . Sleep apnea   . Visit for monitoring Tikosyn therapy 02/07/2018    PAST SURGICAL HISTORY: Past Surgical History:  Procedure Laterality Date  . ABDOMINAL HYSTERECTOMY    .  ABLATION OF DYSRHYTHMIC FOCUS  03/09/2018  . APPENDECTOMY    . ATRIAL FIBRILLATION ABLATION N/A 03/09/2018   Procedure: ATRIAL FIBRILLATION ABLATION;  Surgeon: [name], MD;  Location: [facility];  Service: Cardiovascular;  Laterality: N/A;  . CARDIOVERSION N/A 07/04/2017   Procedure: CARDIOVERSION;  Surgeon: [name], MD;  Location: [facility] ENDOSCOPY;  Service: Cardiovascular;  Laterality: N/A;  . CARDIOVERSION N/A 09/06/2017   Procedure: CARDIOVERSION;  Surgeon: [name], MD;  Location: [facility] ENDOSCOPY;  Service: Cardiovascular;  Laterality: N/A;  . CHOLECYSTECTOMY N/A 02/24/2015   Procedure: LAPAROSCOPIC CHOLECYSTECTOMY;  Surgeon: [name], MD;  Location: [facility];  Service: General;  Laterality: N/A;  . RIGHT/LEFT HEART CATH AND CORONARY ANGIOGRAPHY N/A 08/09/2017   Procedure: RIGHT/LEFT HEART CATH AND CORONARY ANGIOGRAPHY;  Surgeon: [name], MD;  Location: [facility];  Service: Cardiovascular;  Laterality: N/A;  . TEE WITHOUT CARDIOVERSION N/A 07/04/2017   Procedure: TRANSESOPHAGEAL ECHOCARDIOGRAM (TEE);  Surgeon: [name], MD;  Location: [facility] ENDOSCOPY;  Service: Cardiovascular;  Laterality: N/A;  . TEE WITHOUT CARDIOVERSION N/A 03/09/2018   Procedure: TRANSESOPHAGEAL ECHOCARDIOGRAM (TEE);  Surgeon: [name], MD;  Location: [name] ENDOSCOPY;  Service: Cardiovascular;  Laterality: N/A;    SOCIAL HISTORY: Social History   Tobacco Use  . Smoking status: Never Smoker  . Smokeless tobacco: Never Used  Substance Use Topics  . Alcohol use: Yes    Comment: little  . Drug use: No    FAMILY HISTORY: Family History  Problem Relation Age of Onset  . Heart disease Mother   . Hypertension Mother   . Hyperlipidemia Mother   . Diabetes Mother   . Sudden death Mother   . Thyroid disease Mother   . Obesity Mother   . Heart disease Father   . Heart attack Father   . Hypertension Father   . Hyperlipidemia Father   . Diabetes Father     ROS: Review of Systems  Constitutional: Positive for malaise/fatigue and weight loss.  Gastrointestinal: Negative for nausea and vomiting.  Musculoskeletal:       Negative muscle weakness    PHYSICAL EXAM: Blood pressure 102/62, pulse 77, temperature 97.8 F (36.6 C), temperature source Oral, height 5\' 5"  (1.651 m), weight 227 lb (103 kg), SpO2 94 %. Body mass index is 37.77 kg/m. Physical Exam  Constitutional: She is oriented to person, place, and time. She appears well-developed and well-nourished.  Cardiovascular: Normal rate.  Pulmonary/Chest: Breath sounds normal.  Musculoskeletal: Normal range of motion.  Neurological: She is oriented to person, place,  and time.  Skin: Skin is warm and dry.  Psychiatric: She has a normal mood and affect. Her behavior is normal.  Vitals reviewed.   RECENT LABS AND TESTS: BMET    Component Value Date/Time   NA 136 03/10/2018 0508   NA 141 03/08/2018 1442   K 3.9 03/10/2018 0508   CL 106 03/10/2018 0508   CO2 24 03/10/2018 0508   GLUCOSE 101 (H) 03/10/2018 0508   BUN 20 03/10/2018 0508   BUN 22 03/08/2018 1442   CREATININE 1.18 (H) 03/10/2018 0508   CALCIUM 8.1 (L) 03/10/2018 0508   GFRNONAA 48 (L) 03/10/2018 0508   GFRAA 56 (L) 03/10/2018 0508   Lab Results  Component Value Date   HGBA1C 5.8 (H) 11/02/2017   HGBA1C 5.8 (H) 06/26/2017   Lab Results  Component Value Date   INSULIN 8.5 11/02/2017   CBC    Component Value Date/Time  WBC 8.5 03/08/2018 1442   WBC 7.4 08/31/2017 0930   RBC 4.46 03/08/2018 1442   RBC 4.63 08/31/2017 0930   HGB 14.0 03/08/2018 1442   HCT 42.2 03/08/2018 1442   PLT 256 03/08/2018 1442   MCV 95 03/08/2018 1442   MCH 31.4 03/08/2018 1442   MCH 30.2 08/31/2017 0930   MCHC 33.2 03/08/2018 1442   MCHC 33.2 08/31/2017 0930   RDW 13.4 03/08/2018 1442   LYMPHSABS 2.5 03/08/2018 1442   MONOABS 1.2 (H) 02/23/2015 0619   EOSABS 0.1 03/08/2018 1442   BASOSABS 0.0 03/08/2018 1442   Iron/TIBC/Ferritin/ %Sat No results found for: IRON, TIBC, FERRITIN, IRONPCTSAT Lipid Panel     Component Value Date/Time   CHOL 158 11/02/2017 1304   TRIG 151 (H) 11/02/2017 1304   HDL 30 (L) 11/02/2017 1304   CHOLHDL 5.4 06/26/2017 0517   VLDL 15 06/26/2017 0517   LDLCALC 98 11/02/2017 1304   Hepatic Function Panel     Component Value Date/Time   PROT 7.7 11/02/2017 1254   ALBUMIN 4.2 11/02/2017 1254   AST 22 11/02/2017 1254   ALT 18 11/02/2017 1254   ALKPHOS 75 11/02/2017 1254   BILITOT 0.8 11/02/2017 1254      Component Value Date/Time   TSH 3.010 11/02/2017 1254   TSH 4.410 06/24/2017 1046   TSH 1.260 02/22/2015 1954    ASSESSMENT AND PLAN: Vitamin D  deficiency - Plan: Vitamin D, Ergocalciferol, (DRISDOL) 50000 units CAPS capsule  Atrial fibrillation, unspecified type (HCC)  Class 2 severe obesity with serious comorbidity and body mass index (BMI) of 37.0 to 37.9 in adult, unspecified obesity type ([place])  PLAN:  Vitamin D Deficiency Mattelyn was informed that low vitamin D levels contributes to fatigue and are associated with obesity, breast, and colon cancer. Filicia agrees to continue taking prescription Vit D @50 ,000 IU every week #4 and we will refill for 1 month. She will follow up for routine testing of vitamin D, at least 2-3 times per year. She was informed of the risk of over-replacement of vitamin D and agrees to not increase her dose unless she discusses this with [name] first. Tjuana agrees to follow up with our clinic 2 weeks.  Atrial Fibrillation Princetta agrees to follow up with her Cardiologist at previously scheduled appointment. Agness agrees to follow up with our clinic in 2 weeks.  Obesity Zyaira is currently in the action stage of change. As such, her goal is to continue with weight loss efforts She has agreed to follow the Category 2 plan Antwonette has been instructed to work up to a goal of 150 minutes of combined cardio and strengthening exercise per week for weight loss and overall health benefits. We discussed the following Behavioral Modification Strategies today: increasing lean protein intake, work on meal planning and easy cooking plans, planning for success, and keep a strict food journal   Suriyah has agreed to follow up with our clinic in 2 weeks. She was informed of the importance of frequent follow up visits to maximize her success with intensive lifestyle modifications for her multiple health conditions.   OBESITY BEHAVIORAL INTERVENTION VISIT  Today's visit was # 8   Starting weight: 249 lbs Starting date: 11/02/17 Today's weight : 227 lbs  Today's date: 03/29/2018 Total lbs lost to date: 22 At  least 15 minutes were spent on discussing the following behavioral intervention visit.   ASK: We discussed the diagnosis of obesity with [name] today and [place] agreed to  give [name] permission to discuss obesity behavioral modification therapy today.  ASSESS: Robinette has the diagnosis of obesity and her BMI today is 37.77 Jearldine is in the action stage of change   ADVISE: Naamah was educated on the multiple health risks of obesity as well as the benefit of weight loss to improve her health. She was advised of the need for long term treatment and the importance of lifestyle modifications to improve her current health and to decrease her risk of future health problems.  AGREE: Multiple dietary modification options and treatment options were discussed and  Jordin agreed to follow the recommendations documented in the above note.  ARRANGE: Cynai was educated on the importance of frequent visits to treat obesity as outlined per CMS and USPSTF guidelines and agreed to schedule her next follow up appointment today.  I, Trixie Dredge, am acting as transcriptionist for Ilene Qua, MD  I have reviewed the above documentation for accuracy and completeness, and I agree with the above. - Ilene Qua, MD

## 2018-04-13 ENCOUNTER — Encounter (HOSPITAL_COMMUNITY): Payer: Self-pay | Admitting: Nurse Practitioner

## 2018-04-13 ENCOUNTER — Ambulatory Visit (INDEPENDENT_AMBULATORY_CARE_PROVIDER_SITE_OTHER): Payer: Medicare Other | Admitting: Family Medicine

## 2018-04-13 ENCOUNTER — Ambulatory Visit (HOSPITAL_COMMUNITY)
Admission: RE | Admit: 2018-04-13 | Discharge: 2018-04-13 | Disposition: A | Discharge status: Home / Self Care | Payer: Medicare Other | Source: Ambulatory Visit | Attending: Nurse Practitioner | Admitting: Nurse Practitioner

## 2018-04-13 VITALS — BP 108/64 | HR 75 | Ht 65.0 in | Wt 235.0 lb

## 2018-04-13 VITALS — BP 131/74 | HR 76 | Temp 97.9°F | Ht 65.0 in | Wt 231.0 lb

## 2018-04-13 DIAGNOSIS — E559 Vitamin D deficiency, unspecified: Secondary | ICD-10-CM

## 2018-04-13 DIAGNOSIS — Z6838 Body mass index (BMI) 38.0-38.9, adult: Secondary | ICD-10-CM

## 2018-04-13 DIAGNOSIS — Z79899 Other long term (current) drug therapy: Secondary | ICD-10-CM | POA: Insufficient documentation

## 2018-04-13 DIAGNOSIS — M797 Fibromyalgia: Secondary | ICD-10-CM | POA: Insufficient documentation

## 2018-04-13 DIAGNOSIS — I4819 Other persistent atrial fibrillation: Secondary | ICD-10-CM | POA: Insufficient documentation

## 2018-04-13 DIAGNOSIS — M1990 Unspecified osteoarthritis, unspecified site: Secondary | ICD-10-CM | POA: Insufficient documentation

## 2018-04-13 DIAGNOSIS — Z7901 Long term (current) use of anticoagulants: Secondary | ICD-10-CM | POA: Diagnosis not present

## 2018-04-13 DIAGNOSIS — Z8249 Family history of ischemic heart disease and other diseases of the circulatory system: Secondary | ICD-10-CM | POA: Insufficient documentation

## 2018-04-13 DIAGNOSIS — G4730 Sleep apnea, unspecified: Secondary | ICD-10-CM | POA: Diagnosis not present

## 2018-04-13 DIAGNOSIS — K5792 Diverticulitis of intestine, part unspecified, without perforation or abscess without bleeding: Secondary | ICD-10-CM | POA: Diagnosis not present

## 2018-04-13 DIAGNOSIS — Z6839 Body mass index (BMI) 39.0-39.9, adult: Secondary | ICD-10-CM | POA: Diagnosis not present

## 2018-04-13 DIAGNOSIS — J45909 Unspecified asthma, uncomplicated: Secondary | ICD-10-CM | POA: Insufficient documentation

## 2018-04-13 MED ORDER — VITAMIN D (ERGOCALCIFEROL) 1.25 MG (50000 UNIT) PO CAPS: ORAL_CAPSULE | ORAL | 0 refills | Status: DC

## 2018-04-13 NOTE — Progress Notes (Signed)
Primary Care Physician: Patient, No Pcp Per Referring Physician:Dr. Allred   Christie Atkinson is a 62 y.o. female with a h/o persistent afib that was having  breakthrough afib with Tikosyn and had an afib ablation  10/3 with [name].   She is in the afib clinic for f/u. She is in SR. Has not  noted any afib during the recovery period. She is pleased so far with the outcome of the procedure.   No swallowing or groin issues since the procedure. She is still  trying to get an oral appliance through Dr. Ron Parker office for her untreated sleep apnea.  Today, she denies symptoms of palpitations, chest pain, shortness of breath, orthopnea, PND, lower extremity edema, dizziness, presyncope, syncope, or neurologic sequela. The patient is tolerating medications without difficulties and is otherwise without complaint today.   Past Medical History:  Diagnosis Date  . Asthma   . Atrial fibrillation ([place])   . Back pain   . Diastolic dysfunction   . Fibromyalgia   . Gestational diabetes   . Knee pain   . Osteoarthritis   . Persistent atrial fibrillation   . Sleep apnea   . Visit for monitoring Tikosyn therapy 02/07/2018   Past Surgical History:  Procedure Laterality Date  . ABDOMINAL HYSTERECTOMY    . ABLATION OF DYSRHYTHMIC FOCUS  03/09/2018  . APPENDECTOMY    . ATRIAL FIBRILLATION ABLATION N/A 03/09/2018   Procedure: ATRIAL FIBRILLATION ABLATION;  Surgeon: [name], MD;  Location: [place] [facility];  Service: Cardiovascular;  Laterality: N/A;  . CARDIOVERSION N/A 07/04/2017   Procedure: CARDIOVERSION;  Surgeon: [name], MD;  Location: [facility] ENDOSCOPY;  Service: Cardiovascular;  Laterality: N/A;  . CARDIOVERSION N/A 09/06/2017   Procedure: CARDIOVERSION;  Surgeon: [name], MD;  Location: [facility] ENDOSCOPY;  Service: Cardiovascular;  Laterality: N/A;  . CHOLECYSTECTOMY N/A 02/24/2015   Procedure: LAPAROSCOPIC CHOLECYSTECTOMY;  Surgeon: [name], MD;  Location: [place];  Service:  General;  Laterality: N/A;  . RIGHT/LEFT HEART CATH AND CORONARY ANGIOGRAPHY N/A 08/09/2017   Procedure: RIGHT/LEFT HEART CATH AND CORONARY ANGIOGRAPHY;  Surgeon: [name], MD;  Location: [place] [facility];  Service: Cardiovascular;  Laterality: N/A;  . TEE WITHOUT CARDIOVERSION N/A 07/04/2017   Procedure: TRANSESOPHAGEAL ECHOCARDIOGRAM (TEE);  Surgeon: [name], MD;  Location: [facility] ENDOSCOPY;  Service: Cardiovascular;  Laterality: N/A;  . TEE WITHOUT CARDIOVERSION N/A 03/09/2018   Procedure: TRANSESOPHAGEAL ECHOCARDIOGRAM (TEE);  Surgeon: [name], MD;  Location: [facility] ENDOSCOPY;  Service: Cardiovascular;  Laterality: N/A;    Current Outpatient Medications  Medication Sig Dispense Refill  . acetaminophen (TYLENOL) 325 MG tablet Take 325 mg by mouth every 6 (six) hours as needed for moderate pain or headache.    [name] apixaban (ELIQUIS) 5 MG TABS tablet Take 1 tablet (5 mg total) by mouth 2 (two) times daily. 60 tablet 1  . diltiazem (CARDIZEM CD) 120 MG 24 hr capsule Take 1 capsule (120 mg total) by mouth 2 (two) times daily. 180 capsule 2  . dofetilide (TIKOSYN) 125 MCG capsule Take 1 capsule (125 mcg total) by mouth 2 (two) times daily. 180 capsule 2  . furosemide (LASIX) 40 MG tablet Take 1 tablet (40 mg total) by mouth daily. May take an additional 1/2 tablet (20mg ) daily if needed for fluid retention.    . magnesium oxide (MAG-OX) 400 (241.3 Mg) MG tablet Take 1 tablet (400 mg total) by mouth daily. 30 tablet 6  . Multiple  Vitamin (MULTIVITAMIN WITH MINERALS) TABS tablet Take 1 tablet by mouth daily.     . pantoprazole (PROTONIX) 40 MG tablet Take 1 tablet (40 mg total) by mouth daily. 45 tablet 0  . polyethylene glycol powder (GLYCOLAX/MIRALAX) powder Take 17 g by mouth daily. 3350 g 0  . potassium chloride SA (K-DUR,KLOR-CON) 20 MEQ tablet Take 1 tablet (20 mEq total) by mouth daily. Take an extra 1/2 tablet by mouth on days when you take the extra fluid pill (furosemide/Lasix).     . Vitamin D, Ergocalciferol, (DRISDOL) 50000 units CAPS capsule TAKE 1 CAPSULE BY MOUTH EVERY 7 DAYS 4 capsule 0   No current facility-administered medications for this encounter.     Allergies  Allergen Reactions  . Hydrocodone-Acetaminophen Nausea And Vomiting  . Other Other (See Comments)    Metals Polyester - including hospital gowns and sheets - allergic contact dermatitis  . Sulfur Hives    Social History   Socioeconomic History  . Marital status: Married    Spouse name: Not on file  . Number of children: 4  . Years of education: Not on file  . Highest education level: Not on file  Occupational History  . Occupation: Disabled/retired  Social Needs  . Financial resource strain: Not on file  . Food insecurity:    Worry: Not on file    Inability: Not on file  . Transportation needs:    Medical: Not on file    Non-medical: Not on file  Tobacco Use  . Smoking status: Never Smoker  . Smokeless tobacco: Never Used  Substance and Sexual Activity  . Alcohol use: Yes    Comment: little  . Drug use: No  . Sexual activity: Not Currently  Lifestyle  . Physical activity:    Days per week: Not on file    Minutes per session: Not on file  . Stress: Not on file  Relationships  . Social connections:    Talks on phone: Not on file    Gets together: Not on file    Attends religious service: Not on file    Active member of club or organization: Not on file    Attends meetings of clubs or organizations: Not on file    Relationship status: Not on file  . Intimate partner violence:    Fear of current or ex partner: Not on file    Emotionally abused: Not on file    Physically abused: Not on file    Forced sexual activity: Not on file  Other Topics Concern  . Not on file  Social History Narrative   Lives in [place]   Not working    Family History  Problem Relation Age of Onset  . Heart disease Mother   . Hypertension Mother   . Hyperlipidemia Mother   .  Diabetes Mother   . Sudden death Mother   . Thyroid disease Mother   . Obesity Mother   . Heart disease Father   . Heart attack Father   . Hypertension Father   . Hyperlipidemia Father   . Diabetes Father     ROS- All systems are reviewed and negative except as per the HPI above  Physical Exam: Vitals:   04/13/18 1059  BP: 108/64  Pulse: 75  Weight: 106.6 kg  Height: 5\' 5"  (1.651 m)   Wt Readings from Last 3 Encounters:  04/13/18 106.6 kg  04/13/18 104.8 kg  03/29/18 103 kg    Labs: Lab Results  Component Value  Date   NA 136 03/10/2018   K 3.9 03/10/2018   CL 106 03/10/2018   CO2 24 03/10/2018   GLUCOSE 101 (H) 03/10/2018   BUN 20 03/10/2018   CREATININE 1.18 (H) 03/10/2018   CALCIUM 8.1 (L) 03/10/2018   MG 2.0 03/10/2018   Lab Results  Component Value Date   INR 1.07 08/09/2017   Lab Results  Component Value Date   CHOL 158 11/02/2017   HDL 30 (L) 11/02/2017   LDLCALC 98 11/02/2017   TRIG 151 (H) 11/02/2017     GEN- The patient is well appearing, alert and oriented x 3 today.   Head- normocephalic, atraumatic Eyes-  Sclera clear, conjunctiva pink Ears- hearing intact Oropharynx- clear Neck- supple, no JVP Lymph- no cervical lymphadenopathy Lungs- Clear to ausculation bilaterally, normal work of breathing Heart- Regular rate and rhythm, no murmurs, rubs or gallops, PMI not laterally displaced GI- soft, NT, ND, + BS Extremities- no clubbing, cyanosis, or edema MS- no significant deformity or atrophy Skin- no rash or lesion Psych- euthymic mood, full affect Neuro- strength and sensation are intact  EKG-NSR at 73 bpm, pr int 168 ms, qrs int 74 ms, qtc 475 ms Epic records reviewed    Assessment and Plan: 1.Persistent  afib Doing well s/p ablation  x one month, staying in SR Continue dofetilide 125 mcg bid  Continue diltiazem daily Bmet/mag today  2. Chadsvasc score of 2 Continue eliquis 5 mg bid , reminded not to interrupt during the 3  month healing period  3. BMI 39.11 Pt states has lost almost 40 lbs working with a wellness MD  4. Untreated sleep apnea She has not had any luck trying to get an appointment with Dr. Ron Parker to get oral appliance Will message sleep group  Pt does not want to use   cpap  F/u with [name] 1/6  Geroge Baseman. Flynn Lininger, [facility] [address] [place], [place] [address] [phone]

## 2018-04-17 NOTE — Progress Notes (Signed)
Office: [phone]  /  Fax: [phone]   HPI:   Chief Complaint: OBESITY Christie Atkinson is here to discuss her progress with her obesity treatment plan. She is on the Category 2 plan and is following her eating plan approximately 85 % of the time. She states she is exercising 0 minutes 0 times per week. Christie Atkinson had some abdominal pain and is concerned that she had a flare up of diverticulitis. She indulged in chocolate and doughnuts a few days in the past 2 weeks. She missed getting in her protein on 2 days. She also has not done her intermittent fasting. She does make substitutions throughout the meal plan.  Her weight is 231 lb (104.8 kg) today and has had a weight gain of 4 pounds over a period of 2 weeks since her last visit. She has lost 18 lbs since starting treatment with [name].  Vitamin D deficiency Christie Atkinson has a diagnosis of vitamin D deficiency. She is currently taking vit D and denies nausea, vomiting, or muscle weakness.  Diverticulitis There is a question about the diagnosis of diverticulitis. Her pain is significantly improved and she did not seek medical attention. She denies fevers and is tolerating some food by mouth.   ALLERGIES: Allergies  Allergen Reactions  . Hydrocodone-Acetaminophen Nausea And Vomiting  . Other Other (See Comments)    Metals Polyester - including hospital gowns and sheets - allergic contact dermatitis  . Sulfur Hives    MEDICATIONS: Current Outpatient Medications on File Prior to Visit  Medication Sig Dispense Refill  . acetaminophen (TYLENOL) 325 MG tablet Take 325 mg by mouth every 6 (six) hours as needed for moderate pain or headache.    [name] apixaban (ELIQUIS) 5 MG TABS tablet Take 1 tablet (5 mg total) by mouth 2 (two) times daily. 60 tablet 1  . diltiazem (CARDIZEM CD) 120 MG 24 hr capsule Take 1 capsule (120 mg total) by mouth 2 (two) times daily. 180 capsule 2  . dofetilide (TIKOSYN) 125 MCG capsule Take 1 capsule (125 mcg total) by mouth 2  (two) times daily. 180 capsule 2  . furosemide (LASIX) 40 MG tablet Take 1 tablet (40 mg total) by mouth daily. May take an additional 1/2 tablet (20mg ) daily if needed for fluid retention.    . magnesium oxide (MAG-OX) 400 (241.3 Mg) MG tablet Take 1 tablet (400 mg total) by mouth daily. 30 tablet 6  . Multiple Vitamin (MULTIVITAMIN WITH MINERALS) TABS tablet Take 1 tablet by mouth daily.     . pantoprazole (PROTONIX) 40 MG tablet Take 1 tablet (40 mg total) by mouth daily. 45 tablet 0  . polyethylene glycol powder (GLYCOLAX/MIRALAX) powder Take 17 g by mouth daily. 3350 g 0  . potassium chloride SA (K-DUR,KLOR-CON) 20 MEQ tablet Take 1 tablet (20 mEq total) by mouth daily. Take an extra 1/2 tablet by mouth on days when you take the extra fluid pill (furosemide/Lasix).     No current facility-administered medications on file prior to visit.     PAST MEDICAL HISTORY: Past Medical History:  Diagnosis Date  . Asthma   . Atrial fibrillation ([place])   . Back pain   . Diastolic dysfunction   . Fibromyalgia   . Gestational diabetes   . Knee pain   . Osteoarthritis   . Persistent atrial fibrillation   . Sleep apnea   . Visit for monitoring Tikosyn therapy 02/07/2018    PAST SURGICAL HISTORY: Past Surgical History:  Procedure Laterality Date  . ABDOMINAL  HYSTERECTOMY    . ABLATION OF DYSRHYTHMIC FOCUS  03/09/2018  . APPENDECTOMY    . ATRIAL FIBRILLATION ABLATION N/A 03/09/2018   Procedure: ATRIAL FIBRILLATION ABLATION;  Surgeon: [name], MD;  Location: [place] [facility];  Service: Cardiovascular;  Laterality: N/A;  . CARDIOVERSION N/A 07/04/2017   Procedure: CARDIOVERSION;  Surgeon: [name], MD;  Location: [facility] ENDOSCOPY;  Service: Cardiovascular;  Laterality: N/A;  . CARDIOVERSION N/A 09/06/2017   Procedure: CARDIOVERSION;  Surgeon: [name], MD;  Location: [facility] ENDOSCOPY;  Service: Cardiovascular;  Laterality: N/A;  . CHOLECYSTECTOMY N/A 02/24/2015   Procedure:  LAPAROSCOPIC CHOLECYSTECTOMY;  Surgeon: [name], MD;  Location: [place];  Service: General;  Laterality: N/A;  . RIGHT/LEFT HEART CATH AND CORONARY ANGIOGRAPHY N/A 08/09/2017   Procedure: RIGHT/LEFT HEART CATH AND CORONARY ANGIOGRAPHY;  Surgeon: [name], MD;  Location: [place] [facility];  Service: Cardiovascular;  Laterality: N/A;  . TEE WITHOUT CARDIOVERSION N/A 07/04/2017   Procedure: TRANSESOPHAGEAL ECHOCARDIOGRAM (TEE);  Surgeon: [name], MD;  Location: [facility] ENDOSCOPY;  Service: Cardiovascular;  Laterality: N/A;  . TEE WITHOUT CARDIOVERSION N/A 03/09/2018   Procedure: TRANSESOPHAGEAL ECHOCARDIOGRAM (TEE);  Surgeon: [name], MD;  Location: [facility] ENDOSCOPY;  Service: Cardiovascular;  Laterality: N/A;    SOCIAL HISTORY: Social History   Tobacco Use  . Smoking status: Never Smoker  . Smokeless tobacco: Never Used  Substance Use Topics  . Alcohol use: Yes    Comment: little  . Drug use: No    FAMILY HISTORY: Family History  Problem Relation Age of Onset  . Heart disease Mother   . Hypertension Mother   . Hyperlipidemia Mother   . Diabetes Mother   . Sudden death Mother   . Thyroid disease Mother   . Obesity Mother   . Heart disease Father   . Heart attack Father   . Hypertension Father   . Hyperlipidemia Father   . Diabetes Father     ROS: Review of Systems  Constitutional: Negative for fever and weight loss.  Gastrointestinal: Positive for abdominal pain. Negative for nausea and vomiting.  Musculoskeletal:       Negative for muscle weakness.    PHYSICAL EXAM: Blood pressure 131/74, pulse 76, temperature 97.9 F (36.6 C), temperature source Oral, height 5\' 5"  (1.651 m), weight 231 lb (104.8 kg), SpO2 94 %. Body mass index is 38.44 kg/m. Physical Exam  Constitutional: She is oriented to person, place, and time. She appears well-developed and well-nourished.  Cardiovascular: Normal rate.  Pulmonary/Chest: Effort normal.  Musculoskeletal: Normal  range of motion.  Neurological: She is oriented to person, place, and time.  Skin: Skin is warm and dry.  Psychiatric: She has a normal mood and affect. Her behavior is normal.  Vitals reviewed.   RECENT LABS AND TESTS: BMET    Component Value Date/Time   NA 136 03/10/2018 0508   NA 141 03/08/2018 1442   K 3.9 03/10/2018 0508   CL 106 03/10/2018 0508   CO2 24 03/10/2018 0508   GLUCOSE 101 (H) 03/10/2018 0508   BUN 20 03/10/2018 0508   BUN 22 03/08/2018 1442   CREATININE 1.18 (H) 03/10/2018 0508   CALCIUM 8.1 (L) 03/10/2018 0508   GFRNONAA 48 (L) 03/10/2018 0508   GFRAA 56 (L) 03/10/2018 0508   Lab Results  Component Value Date   HGBA1C 5.8 (H) 11/02/2017   HGBA1C 5.8 (H) 06/26/2017   Lab Results  Component Value Date   INSULIN 8.5 11/02/2017  CBC    Component Value Date/Time   WBC 8.5 03/08/2018 1442   WBC 7.4 08/31/2017 0930   RBC 4.46 03/08/2018 1442   RBC 4.63 08/31/2017 0930   HGB 14.0 03/08/2018 1442   HCT 42.2 03/08/2018 1442   PLT 256 03/08/2018 1442   MCV 95 03/08/2018 1442   MCH 31.4 03/08/2018 1442   MCH 30.2 08/31/2017 0930   MCHC 33.2 03/08/2018 1442   MCHC 33.2 08/31/2017 0930   RDW 13.4 03/08/2018 1442   LYMPHSABS 2.5 03/08/2018 1442   MONOABS 1.2 (H) 02/23/2015 0619   EOSABS 0.1 03/08/2018 1442   BASOSABS 0.0 03/08/2018 1442   Iron/TIBC/Ferritin/ %Sat No results found for: IRON, TIBC, FERRITIN, IRONPCTSAT Lipid Panel     Component Value Date/Time   CHOL 158 11/02/2017 1304   TRIG 151 (H) 11/02/2017 1304   HDL 30 (L) 11/02/2017 1304   CHOLHDL 5.4 06/26/2017 0517   VLDL 15 06/26/2017 0517   LDLCALC 98 11/02/2017 1304   Hepatic Function Panel     Component Value Date/Time   PROT 7.7 11/02/2017 1254   ALBUMIN 4.2 11/02/2017 1254   AST 22 11/02/2017 1254   ALT 18 11/02/2017 1254   ALKPHOS 75 11/02/2017 1254   BILITOT 0.8 11/02/2017 1254      Component Value Date/Time   TSH 3.010 11/02/2017 1254   TSH 4.410 06/24/2017 1046    TSH 1.260 02/22/2015 1954   Results for BABETTE, STUM (MRN [id number]) as of 04/17/2018 11:05  Ref. Range 11/02/2017 12:54  Vitamin D, 25-Hydroxy Latest Ref Range: 30.0 - 100.0 ng/mL 30.9   ASSESSMENT AND PLAN: Vitamin D deficiency - Plan: Vitamin D, Ergocalciferol, (DRISDOL) 1.25 MG (50000 UT) CAPS capsule  Diverticulitis  Class 2 severe obesity with serious comorbidity and body mass index (BMI) of 38.0 to 38.9 in adult, unspecified obesity type (HCC)  PLAN:  Vitamin D Deficiency Christie Atkinson was informed that low vitamin D levels contributes to fatigue and are associated with obesity, breast, and colon cancer. She agrees to continue to take prescription Vit D @50 ,000 IU every week #4 with no refills and will follow up for routine testing of vitamin D, at least 2-3 times per year. She was informed of the risk of over-replacement of vitamin D and agrees to not increase her dose unless she discusses this with [name] first. Christie Atkinson agrees to follow up in 4 weeks.  Diverticulitis Christie Atkinson was encouraged to follow up with her PCP if her abdominal pain persists. She agreed to this plan and will follow up with [name] for an office visit in 4 weeks.  Obesity Christie Atkinson is currently in the action stage of change. As such, her goal is to continue with weight loss efforts. She has agreed to follow the Category 2 plan. Christie Atkinson has been instructed to work up to a goal of 150 minutes of combined cardio and strengthening exercise per week for weight loss and overall health benefits. We discussed the following Behavioral Modification Strategies today: increasing lean protein intake, increasing vegetables, work on meal planning and easy cooking plans, and planning for success.  Christie Atkinson has agreed to follow up with our clinic in 2 weeks for an appointment with our registered dietitian and in 4 weeks for an office visit.[name] She was informed of the importance of frequent follow up visits to maximize her success with  intensive lifestyle modifications for her multiple health conditions.   OBESITY BEHAVIORAL INTERVENTION VISIT  Today's visit was # 9   Starting weight: 249  lbs Starting date: 11/02/17 Today's weight : Weight: 231 lb (104.8 kg)  Today's date: 04/13/2018 Total lbs lost to date: 18 At least 15 minutes were spent on discussing the following behavioral intervention visit.   ASK: We discussed the diagnosis of obesity with Christie Atkinson today and Christie Atkinson agreed to give [name] permission to discuss obesity behavioral modification therapy today.  ASSESS: Christie Atkinson has the diagnosis of obesity and her BMI today is 39.11. Christie Atkinson is in the action stage of change.   ADVISE: Amani was educated on the multiple health risks of obesity as well as the benefit of weight loss to improve her health. She was advised of the need for long term treatment and the importance of lifestyle modifications to improve her current health and to decrease her risk of future health problems.  AGREE: Multiple dietary modification options and treatment options were discussed and Christie Atkinson agreed to follow the recommendations documented in the above note.  ARRANGE: Shivaun was educated on the importance of frequent visits to treat obesity as outlined per CMS and USPSTF guidelines and agreed to schedule her next follow up appointment today.  I, Christie Atkinson, am acting as Location manager for Eber Jones, MD  I have reviewed the above documentation for accuracy and completeness, and I agree with the above. - Christie Qua, MD

## 2018-04-19 ENCOUNTER — Telehealth: Payer: Self-pay | Admitting: Internal Medicine

## 2018-04-19 NOTE — Telephone Encounter (Signed)
° ° °  Patient has questions about how she should be feeling post ablation. Patient states she felt flush yesterday BP today was 130/90 HR 127 Neck soreness

## 2018-04-19 NOTE — Telephone Encounter (Signed)
Patient having elevated HR in the 120s since last PM. She has not taken any extra medication. She states she feels to be in rhythm? Discussed with Roderic Palau NP - will take an extra 120mg  of cardizem now and call tomorrow morning with HRs. If still elevated make further medication changes. Pt in agreement.

## 2018-04-19 NOTE — Telephone Encounter (Signed)
Left message to call back  

## 2018-05-01 ENCOUNTER — Ambulatory Visit (INDEPENDENT_AMBULATORY_CARE_PROVIDER_SITE_OTHER): Payer: Medicare Other | Admitting: Dietician

## 2018-05-01 VITALS — Ht 65.0 in | Wt 231.0 lb

## 2018-05-01 DIAGNOSIS — Z6839 Body mass index (BMI) 39.0-39.9, adult: Principal | ICD-10-CM

## 2018-05-01 DIAGNOSIS — R7303 Prediabetes: Secondary | ICD-10-CM

## 2018-05-01 NOTE — Progress Notes (Signed)
  Office: [phone]  /  Fax: [phone]     Christie Atkinson has a diagnosis of prediabetes based on her elevated HgA1c and was informed this puts her at greater risk of developing diabetes. Christie Atkinson is here today for diabetes risk nutrition counseling which includes her obesity treatment plan.   Her weight today is 231 lbs, a 4 lb weight loss since her last visit. She has had a weight loss of 18 lbs since beginning treatment with [name].   She states she is using the Category 2 meal plan as a guide to make healthier meal choices. She is not using a journaling app such as My Fitness Pal or keeping any kind of food record. She reports trying to get "80 g" of protein and make smarter food choices and portion control. She was disinterested in using the My Fitness Pal app to journal her food intake.   Reviewed  food nutrients ie protein, fats, simple and complex carbohydrates and how these affect blood sugars, insulin levels and efforts for ongoing weight loss.  Focus on portion control,  avoiding simple carbohydrates and increasing lean proteins were discussed in detail.  Healthy Thanksgiving eating strategies also discussed.   Christie Atkinson is on the following meal plan: Portion control.  Her  meal plan was individualized for maximum benefit.  Also discussed at length the following behavioral modifications to help maximize success: increasing lean protein intake, decreasing simple carbohydrates, keeping healthy foods in the home, holiday eating strategies, celebration eating strategies.   Christie Atkinson has been instructed to work up to a goal of 150 minutes of combined cardio and strengthening exercise per week for weight loss and overall health benefits.    OBESITY BEHAVIORAL INTERVENTION VISIT  Today's visit was # 10   Starting weight: 249 lbs Starting date: 11/02/17 Today's weight : Weight: 231 lb (104.8 kg)  Today's date: 05/01/2018 Total lbs lost to date: 18 At least 15 minutes were spent on discussing the  following behavioral intervention visit.   ASK: We discussed the diagnosis of obesity with Christie Atkinson today and Christie Atkinson agreed to give [name] permission to discuss obesity behavioral modification therapy today.  ASSESS: [name] has the diagnosis of obesity and her BMI today is 39.1 Christie Atkinson is in the action stage of change   ADVISE: Christie Atkinson was educated on the multiple health risks of obesity as well as the benefit of weight loss to improve her health. She was advised of the need for long term treatment and the importance of lifestyle modifications to improve her current health and to decrease her risk of future health problems.  AGREE: Multiple dietary modification options and treatment options were discussed and  Christie Atkinson agreed to follow the recommendations documented in the above note.  ARRANGE: Christie Atkinson was educated on the importance of frequent visits to treat obesity as outlined per CMS and USPSTF guidelines and agreed to schedule her next follow up appointment today.

## 2018-05-14 ENCOUNTER — Other Ambulatory Visit (INDEPENDENT_AMBULATORY_CARE_PROVIDER_SITE_OTHER): Payer: Self-pay | Admitting: Family Medicine

## 2018-05-14 DIAGNOSIS — E559 Vitamin D deficiency, unspecified: Secondary | ICD-10-CM

## 2018-05-17 ENCOUNTER — Encounter (INDEPENDENT_AMBULATORY_CARE_PROVIDER_SITE_OTHER): Payer: Self-pay | Admitting: Family Medicine

## 2018-05-17 ENCOUNTER — Ambulatory Visit (INDEPENDENT_AMBULATORY_CARE_PROVIDER_SITE_OTHER): Payer: Medicare Other | Admitting: Family Medicine

## 2018-05-17 VITALS — BP 122/75 | HR 69 | Temp 97.9°F | Ht 65.0 in | Wt 231.0 lb

## 2018-05-17 DIAGNOSIS — E7849 Other hyperlipidemia: Secondary | ICD-10-CM

## 2018-05-17 DIAGNOSIS — Z6838 Body mass index (BMI) 38.0-38.9, adult: Secondary | ICD-10-CM | POA: Diagnosis not present

## 2018-05-17 DIAGNOSIS — R7303 Prediabetes: Secondary | ICD-10-CM | POA: Diagnosis not present

## 2018-05-17 DIAGNOSIS — E559 Vitamin D deficiency, unspecified: Secondary | ICD-10-CM | POA: Diagnosis not present

## 2018-05-17 MED ORDER — VITAMIN D (ERGOCALCIFEROL) 1.25 MG (50000 UNIT) PO CAPS: ORAL_CAPSULE | ORAL | 0 refills | Status: DC

## 2018-05-17 NOTE — Progress Notes (Signed)
Office: [phone]  /  Fax: [phone]   HPI:   Chief Complaint: OBESITY Christie Atkinson is here to discuss her progress with her obesity treatment plan. She is on the Category 2 plan and is following her eating plan approximately 90 % of the time. She states she is walking for 20-30 minutes 4-5 times per week. Christie Atkinson had a busy few weeks. She became a Scientist, physiological of estate. She notes a lot of carrying garbage down and up stairs. She has a busy few weeks with holiday season.  Her weight is 231 lb (104.8 kg) today and has not lost weight since her last visit. She has lost 18 lbs since starting treatment with [name].  Vitamin D Deficiency Christie Atkinson has a diagnosis of vitamin D deficiency. She is currently taking prescription Vit D. She notes fatigue and denies nausea, vomiting or muscle weakness.  Pre-Diabetes Christie Atkinson has a diagnosis of pre-diabetes based on her elevated Hgb A1c and was informed this puts her at greater risk of developing diabetes. She is not on medication and she notes carbohydrate cravings. She continues to work on diet and exercise to decrease risk of diabetes. She denies nausea or hypoglycemia.  Hyperlipidemia Christie Atkinson has hyperlipidemia and has been trying to improve her cholesterol levels with intensive lifestyle modification including a low saturated fat diet, exercise and weight loss. She is not on medications and denies any chest pain, claudication or myalgias.  ALLERGIES: Allergies  Allergen Reactions  . Hydrocodone-Acetaminophen Nausea And Vomiting  . Other Other (See Comments)    Metals Polyester - including hospital gowns and sheets - allergic contact dermatitis  . Sulfur Hives    MEDICATIONS: Current Outpatient Medications on File Prior to Visit  Medication Sig Dispense Refill  . acetaminophen (TYLENOL) 325 MG tablet Take 325 mg by mouth every 6 (six) hours as needed for moderate pain or headache.    [name] apixaban (ELIQUIS) 5 MG TABS tablet Take 1 tablet (5 mg  total) by mouth 2 (two) times daily. 60 tablet 1  . diltiazem (CARDIZEM CD) 120 MG 24 hr capsule Take 1 capsule (120 mg total) by mouth 2 (two) times daily. 180 capsule 2  . dofetilide (TIKOSYN) 125 MCG capsule Take 1 capsule (125 mcg total) by mouth 2 (two) times daily. 180 capsule 2  . furosemide (LASIX) 40 MG tablet Take 1 tablet (40 mg total) by mouth daily. May take an additional 1/2 tablet (20mg ) daily if needed for fluid retention.    . magnesium oxide (MAG-OX) 400 (241.3 Mg) MG tablet Take 1 tablet (400 mg total) by mouth daily. 30 tablet 6  . Multiple Vitamin (MULTIVITAMIN WITH MINERALS) TABS tablet Take 1 tablet by mouth daily.     . polyethylene glycol powder (GLYCOLAX/MIRALAX) powder Take 17 g by mouth daily. 3350 g 0  . potassium chloride SA (K-DUR,KLOR-CON) 20 MEQ tablet Take 1 tablet (20 mEq total) by mouth daily. Take an extra 1/2 tablet by mouth on days when you take the extra fluid pill (furosemide/Lasix).    . Vitamin D, Ergocalciferol, (DRISDOL) 1.25 MG (50000 UT) CAPS capsule TAKE 1 CAPSULE BY MOUTH EVERY 7 DAYS 4 capsule 0  . pantoprazole (PROTONIX) 40 MG tablet Take 1 tablet (40 mg total) by mouth daily. 45 tablet 0   No current facility-administered medications on file prior to visit.     PAST MEDICAL HISTORY: Past Medical History:  Diagnosis Date  . Asthma   . Atrial fibrillation ([place])   . Back pain   .  Diastolic dysfunction   . Fibromyalgia   . Gestational diabetes   . Knee pain   . Osteoarthritis   . Persistent atrial fibrillation   . Sleep apnea   . Visit for monitoring Tikosyn therapy 02/07/2018    PAST SURGICAL HISTORY: Past Surgical History:  Procedure Laterality Date  . ABDOMINAL HYSTERECTOMY    . ABLATION OF DYSRHYTHMIC FOCUS  03/09/2018  . APPENDECTOMY    . ATRIAL FIBRILLATION ABLATION N/A 03/09/2018   Procedure: ATRIAL FIBRILLATION ABLATION;  Surgeon: [name], MD;  Location: [place] [facility];  Service: Cardiovascular;  Laterality: N/A;    . CARDIOVERSION N/A 07/04/2017   Procedure: CARDIOVERSION;  Surgeon: [name], MD;  Location: [facility] ENDOSCOPY;  Service: Cardiovascular;  Laterality: N/A;  . CARDIOVERSION N/A 09/06/2017   Procedure: CARDIOVERSION;  Surgeon: [name], MD;  Location: [facility] ENDOSCOPY;  Service: Cardiovascular;  Laterality: N/A;  . CHOLECYSTECTOMY N/A 02/24/2015   Procedure: LAPAROSCOPIC CHOLECYSTECTOMY;  Surgeon: [name], MD;  Location: [place];  Service: General;  Laterality: N/A;  . RIGHT/LEFT HEART CATH AND CORONARY ANGIOGRAPHY N/A 08/09/2017   Procedure: RIGHT/LEFT HEART CATH AND CORONARY ANGIOGRAPHY;  Surgeon: [name], MD;  Location: [place] [facility];  Service: Cardiovascular;  Laterality: N/A;  . TEE WITHOUT CARDIOVERSION N/A 07/04/2017   Procedure: TRANSESOPHAGEAL ECHOCARDIOGRAM (TEE);  Surgeon: [name], MD;  Location: [facility] ENDOSCOPY;  Service: Cardiovascular;  Laterality: N/A;  . TEE WITHOUT CARDIOVERSION N/A 03/09/2018   Procedure: TRANSESOPHAGEAL ECHOCARDIOGRAM (TEE);  Surgeon: [name], MD;  Location: [facility] ENDOSCOPY;  Service: Cardiovascular;  Laterality: N/A;    SOCIAL HISTORY: Social History   Tobacco Use  . Smoking status: Never Smoker  . Smokeless tobacco: Never Used  Substance Use Topics  . Alcohol use: Yes    Comment: little  . Drug use: No    FAMILY HISTORY: Family History  Problem Relation Age of Onset  . Heart disease Mother   . Hypertension Mother   . Hyperlipidemia Mother   . Diabetes Mother   . Sudden death Mother   . Thyroid disease Mother   . Obesity Mother   . Heart disease Father   . Heart attack Father   . Hypertension Father   . Hyperlipidemia Father   . Diabetes Father     ROS: Review of Systems  Constitutional: Positive for malaise/fatigue. Negative for weight loss.  Cardiovascular: Negative for chest pain and claudication.  Gastrointestinal: Negative for nausea and vomiting.  Musculoskeletal: Negative for myalgias.       Negative  muscle weakness  Endo/Heme/Allergies:       Negative hypoglycemia    PHYSICAL EXAM: Blood pressure 122/75, pulse 69, temperature 97.9 F (36.6 C), temperature source Oral, height 5\' 5"  (1.651 m), weight 231 lb (104.8 kg), SpO2 99 %. Body mass index is 38.44 kg/m. Physical Exam  Constitutional: She is oriented to person, place, and time. She appears well-developed and well-nourished.  Cardiovascular: Normal rate.  Pulmonary/Chest: Effort normal.  Musculoskeletal: Normal range of motion.  Neurological: She is oriented to person, place, and time.  Skin: Skin is warm and dry.  Psychiatric: She has a normal mood and affect. Her behavior is normal.  Vitals reviewed.   RECENT LABS AND TESTS: BMET    Component Value Date/Time   NA 136 03/10/2018 0508   NA 141 03/08/2018 1442   K 3.9 03/10/2018 0508   CL 106 03/10/2018 0508   CO2 24 03/10/2018 0508   GLUCOSE 101 (H) 03/10/2018  0508   BUN 20 03/10/2018 0508   BUN 22 03/08/2018 1442   CREATININE 1.18 (H) 03/10/2018 0508   CALCIUM 8.1 (L) 03/10/2018 0508   GFRNONAA 48 (L) 03/10/2018 0508   GFRAA 56 (L) 03/10/2018 0508   Lab Results  Component Value Date   HGBA1C 5.8 (H) 11/02/2017   HGBA1C 5.8 (H) 06/26/2017   Lab Results  Component Value Date   INSULIN 8.5 11/02/2017   CBC    Component Value Date/Time   WBC 8.5 03/08/2018 1442   WBC 7.4 08/31/2017 0930   RBC 4.46 03/08/2018 1442   RBC 4.63 08/31/2017 0930   HGB 14.0 03/08/2018 1442   HCT 42.2 03/08/2018 1442   PLT 256 03/08/2018 1442   MCV 95 03/08/2018 1442   MCH 31.4 03/08/2018 1442   MCH 30.2 08/31/2017 0930   MCHC 33.2 03/08/2018 1442   MCHC 33.2 08/31/2017 0930   RDW 13.4 03/08/2018 1442   LYMPHSABS 2.5 03/08/2018 1442   MONOABS 1.2 (H) 02/23/2015 0619   EOSABS 0.1 03/08/2018 1442   BASOSABS 0.0 03/08/2018 1442   Iron/TIBC/Ferritin/ %Sat No results found for: IRON, TIBC, FERRITIN, IRONPCTSAT Lipid Panel     Component Value Date/Time   CHOL 158  11/02/2017 1304   TRIG 151 (H) 11/02/2017 1304   HDL 30 (L) 11/02/2017 1304   CHOLHDL 5.4 06/26/2017 0517   VLDL 15 06/26/2017 0517   LDLCALC 98 11/02/2017 1304   Hepatic Function Panel     Component Value Date/Time   PROT 7.7 11/02/2017 1254   ALBUMIN 4.2 11/02/2017 1254   AST 22 11/02/2017 1254   ALT 18 11/02/2017 1254   ALKPHOS 75 11/02/2017 1254   BILITOT 0.8 11/02/2017 1254      Component Value Date/Time   TSH 3.010 11/02/2017 1254   TSH 4.410 06/24/2017 1046   TSH 1.260 02/22/2015 1954    ASSESSMENT AND PLAN: Vitamin D deficiency - Plan: VITAMIN D 25 Hydroxy (Vit-D Deficiency, Fractures), Vitamin D, Ergocalciferol, (DRISDOL) 1.25 MG (50000 UT) CAPS capsule  Prediabetes - Plan: Comprehensive metabolic panel, Insulin, random, Hemoglobin A1c  Other hyperlipidemia - Plan: Lipid Panel With LDL/HDL Ratio  Class 2 severe obesity with serious comorbidity and body mass index (BMI) of 38.0 to 38.9 in adult, unspecified obesity type (HCC)  PLAN:  Vitamin D Deficiency Christie Atkinson was informed that low vitamin D levels contributes to fatigue and are associated with obesity, breast, and colon cancer. Christie Atkinson agrees to continue taking prescription Vit D @50 ,000 IU every week #4 and we will refill for 1 month. She will follow up for routine testing of vitamin D, at least 2-3 times per year. She was informed of the risk of over-replacement of vitamin D and agrees to not increase her dose unless she discusses this with [name] first. We will check Vit D level today. Christie Atkinson agrees to follow up with our clinic in 3 weeks.  Pre-Diabetes Christie Atkinson will continue to work on weight loss, exercise, and decreasing simple carbohydrates in her diet to help decrease the risk of diabetes. We dicussed metformin including benefits and risks. She was informed that eating too many simple carbohydrates or too many calories at one sitting increases the likelihood of GI side effects. Christie Atkinson declined metformin for now  and a prescription was not written today. We will check Hgb A1c and insulin today. Christie Atkinson agrees to follow up with our clinic in 3 weeks as directed to monitor her progress.  Hyperlipidemia Christie Atkinson was informed of the American Heart Association  Guidelines emphasizing intensive lifestyle modifications as the first line treatment for hyperlipidemia. We discussed many lifestyle modifications today in depth, and Christie Atkinson will continue to work on decreasing saturated fats such as fatty red meat, butter and many fried foods. She will also increase vegetables and lean protein in her diet and continue to work on exercise and weight loss efforts. We will check FLP today and Christie Atkinson agrees to follow up with our clinic in 3 weeks.  Obesity Christie Atkinson is currently in the action stage of change. As such, her goal is to continue with weight loss efforts She has agreed to follow the Category 2 plan Christie Atkinson has been instructed to work up to a goal of 150 minutes of combined cardio and strengthening exercise per week for weight loss and overall health benefits. We discussed the following Behavioral Modification Strategies today: increasing lean protein intake, work on meal planning and easy cooking plans, holiday eating strategies, and planning for success  We will discuss increase in physical activity in January.  Christie Atkinson has agreed to follow up with our clinic in 3 weeks. She was informed of the importance of frequent follow up visits to maximize her success with intensive lifestyle modifications for her multiple health conditions.   OBESITY BEHAVIORAL INTERVENTION VISIT  Today's visit was # 10   Starting weight: 249 lbs Starting date: 11/02/17 Today's weight : 231 lbs Today's date: 05/17/2018 Total lbs lost to date: 18 At least 15 minutes were spent on discussing the following behavioral intervention visit.   ASK: We discussed the diagnosis of obesity with Christie Atkinson today and Christie Atkinson agreed to give  [name] permission to discuss obesity behavioral modification therapy today.  ASSESS: Christie Atkinson has the diagnosis of obesity and her BMI today is 38.44 Christie Atkinson is in the action stage of change   ADVISE: Christie Atkinson was educated on the multiple health risks of obesity as well as the benefit of weight loss to improve her health. She was advised of the need for long term treatment and the importance of lifestyle modifications to improve her current health and to decrease her risk of future health problems.  AGREE: Multiple dietary modification options and treatment options were discussed and  Christie Atkinson agreed to follow the recommendations documented in the above note.  ARRANGE: Christie Atkinson was educated on the importance of frequent visits to treat obesity as outlined per CMS and USPSTF guidelines and agreed to schedule her next follow up appointment today.  I, Trixie Dredge, am acting as transcriptionist for Ilene Qua, MD  I have reviewed the above documentation for accuracy and completeness, and I agree with the above. - Ilene Qua, MD

## 2018-05-18 LAB — COMPREHENSIVE METABOLIC PANEL
ALK PHOS: 86 IU/L (ref 39–117)
ALT: 15 IU/L (ref 0–32)
AST: 24 IU/L (ref 0–40)
Albumin/Globulin Ratio: 1.3 (ref 1.2–2.2)
Albumin: 4.2 g/dL (ref 3.6–4.8)
BUN/Creatinine Ratio: 18 (ref 12–28)
BUN: 16 mg/dL (ref 8–27)
Bilirubin Total: 0.6 mg/dL (ref 0.0–1.2)
CALCIUM: 9.4 mg/dL (ref 8.7–10.3)
CHLORIDE: 102 mmol/L (ref 96–106)
CO2: 24 mmol/L (ref 20–29)
Creatinine, Ser: 0.9 mg/dL (ref 0.57–1.00)
GFR calc Af Amer: 79 mL/min/{1.73_m2} (ref >59)
GFR, EST NON AFRICAN AMERICAN: 68 mL/min/{1.73_m2} (ref >59)
GLOBULIN, TOTAL: 3.3 g/dL (ref 1.5–4.5)
GLUCOSE: 101 mg/dL — AB (ref 65–99)
Potassium: 4.4 mmol/L (ref 3.5–5.2)
SODIUM: 141 mmol/L (ref 134–144)
Total Protein: 7.5 g/dL (ref 6.0–8.5)

## 2018-05-18 LAB — LIPID PANEL WITH LDL/HDL RATIO
CHOLESTEROL TOTAL: 172 mg/dL (ref 100–199)
HDL: 33 mg/dL — ABNORMAL LOW (ref >39)
LDL Calculated: 115 mg/dL — ABNORMAL HIGH (ref 0–99)
LDl/HDL Ratio: 3.5 ratio — ABNORMAL HIGH (ref 0.0–3.2)
TRIGLYCERIDES: 119 mg/dL (ref 0–149)
VLDL CHOLESTEROL CAL: 24 mg/dL (ref 5–40)

## 2018-05-18 LAB — INSULIN, RANDOM: INSULIN: 16.1 u[IU]/mL (ref 2.6–24.9)

## 2018-05-18 LAB — HEMOGLOBIN A1C
Est. average glucose Bld gHb Est-mCnc: 126 mg/dL
HEMOGLOBIN A1C: 6 % — AB (ref 4.8–5.6)

## 2018-05-18 LAB — VITAMIN D 25 HYDROXY (VIT D DEFICIENCY, FRACTURES): VIT D 25 HYDROXY: 51.1 ng/mL (ref 30.0–100.0)

## 2018-05-29 ENCOUNTER — Other Ambulatory Visit (HOSPITAL_COMMUNITY): Payer: Self-pay | Admitting: *Deleted

## 2018-05-29 ENCOUNTER — Telehealth: Payer: Self-pay | Admitting: Internal Medicine

## 2018-05-29 MED ORDER — DOFETILIDE 125 MCG PO CAPS: 125.0000 ug | ORAL_CAPSULE | Freq: Two times a day (BID) | ORAL | 2 refills | Status: DC

## 2018-05-29 MED ORDER — DOFETILIDE 125 MCG PO CAPS: 125.0000 ug | ORAL_CAPSULE | Freq: Two times a day (BID) | ORAL | 3 refills | Status: DC

## 2018-05-29 NOTE — Telephone Encounter (Signed)
New message     *STAT* If patient is at the pharmacy, call can be transferred to refill team.   1. Which medications need to be refilled? (please list name of each medication and dose if known) tikosyn 125 mcg  2. Which pharmacy/location (including street and city if local pharmacy) is medication to be sent to? [facility] [place]   3. Do they need a 30 day or 90 day supply? 90 day

## 2018-05-29 NOTE — Telephone Encounter (Signed)
rx sent in as requested 

## 2018-06-08 ENCOUNTER — Encounter (INDEPENDENT_AMBULATORY_CARE_PROVIDER_SITE_OTHER): Payer: Self-pay | Admitting: Family Medicine

## 2018-06-08 ENCOUNTER — Ambulatory Visit (INDEPENDENT_AMBULATORY_CARE_PROVIDER_SITE_OTHER): Payer: Medicare Other | Admitting: Family Medicine

## 2018-06-08 VITALS — BP 112/77 | HR 74 | Temp 97.8°F | Ht 65.0 in | Wt 232.0 lb

## 2018-06-08 DIAGNOSIS — R7303 Prediabetes: Secondary | ICD-10-CM | POA: Diagnosis not present

## 2018-06-08 DIAGNOSIS — E559 Vitamin D deficiency, unspecified: Secondary | ICD-10-CM | POA: Diagnosis not present

## 2018-06-08 DIAGNOSIS — Z6838 Body mass index (BMI) 38.0-38.9, adult: Secondary | ICD-10-CM | POA: Diagnosis not present

## 2018-06-08 MED ORDER — VITAMIN D (ERGOCALCIFEROL) 1.25 MG (50000 UNIT) PO CAPS: 50000.0000 [IU] | ORAL_CAPSULE | ORAL | 0 refills | Status: DC

## 2018-06-08 MED ORDER — METFORMIN HCL 500 MG PO TABS: 500.0000 mg | ORAL_TABLET | Freq: Every day | ORAL | 0 refills | Status: DC

## 2018-06-08 NOTE — Progress Notes (Signed)
Office: [phone]  /  Fax: [phone]   HPI:   Chief Complaint: OBESITY Christie Atkinson is here to discuss her progress with her obesity treatment plan. She is on the Category 2 plan and is following her eating plan approximately 90 % of the time. 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 Christie Atkinson has been cleaning an estate and doing a lot of lifting and moving. She finds the Category 2 to be easy to follow. She does realize she has been indulging more than she should have, and she wants to get back on track.  Her weight is 232 lb (105.2 kg) today and has gained 1 pound since her last visit. She has lost 17 lbs since starting treatment with [name].  Pre-Diabetes Christie Atkinson has a diagnosis of pre-diabetes based on her elevated Hgb A1c, slight increase in Hgb A1c and doubling of insulin. She was informed this puts her at greater risk of developing diabetes. She is not taking metformin currently and she denies hunger. She continues to work on diet and exercise to decrease risk of diabetes. She denies hypoglycemia.  Vitamin D Deficiency Christie Atkinson has a diagnosis of vitamin D deficiency. She is currently taking prescription Vit D. She notes fatigue and denies nausea, vomiting or muscle weakness.  ASSESSMENT AND PLAN:  Prediabetes - Plan: metFORMIN (GLUCOPHAGE) 500 MG tablet  Vitamin D deficiency - Plan: Vitamin D, Ergocalciferol, (DRISDOL) 1.25 MG (50000 UT) CAPS capsule  Class 2 severe obesity with serious comorbidity and body mass index (BMI) of 38.0 to 38.9 in adult, unspecified obesity type ([place])  PLAN:  Pre-Diabetes Christie Atkinson will continue to work on weight loss, exercise, and decreasing simple carbohydrates in her diet to help decrease the risk of diabetes. We dicussed metformin including benefits and risks. She was informed that eating too many simple carbohydrates or too many calories at one sitting increases the likelihood of GI side effects. Christie Atkinson agrees to start metformin 500 mg  PO q AM #30 with no refills. Christie Atkinson agrees to follow up with our clinic in 2 weeks as directed to monitor her progress.  Vitamin D Deficiency Christie Atkinson was informed that low vitamin D levels contributes to fatigue and are associated with obesity, breast, and colon cancer. Christie Atkinson agrees to continue taking prescription Vit D @50 ,000 IU every week #12 and we will refill for 1 month. She will follow up for routine testing of vitamin D, at least 2-3 times per year. She was informed of the risk of over-replacement of vitamin D and agrees to not increase her dose unless she discusses this with [name] first. Christie Atkinson agrees to follow up with our clinic in 2 weeks.  Obesity Christie Atkinson is currently in the action stage of change. As such, her goal is to continue with weight loss efforts She has agreed to follow the Category 2 plan Christie Atkinson has been instructed to work up to a goal of 150 minutes of combined cardio and strengthening exercise per week for weight loss and overall health benefits. We discussed the following Behavioral Modification Strategies today: increasing lean protein intake, increasing vegetables, work on meal planning and easy cooking plans, and planning for success   Christie Atkinson has agreed to follow up with our clinic in 2 weeks. She was informed of the importance of frequent follow up visits to maximize her success with intensive lifestyle modifications for her multiple health conditions.  ALLERGIES: Allergies  Allergen Reactions  . Hydrocodone-Acetaminophen Nausea And Vomiting  . Other Other (See Comments)  Metals Polyester - including hospital gowns and sheets - allergic contact dermatitis  . Sulfur Hives    MEDICATIONS: Current Outpatient Medications on File Prior to Visit  Medication Sig Dispense Refill  . acetaminophen (TYLENOL) 325 MG tablet Take 325 mg by mouth every 6 (six) hours as needed for moderate pain or headache.    [name] apixaban (ELIQUIS) 5 MG TABS tablet Take 1 tablet (5 mg  total) by mouth 2 (two) times daily. 60 tablet 1  . diltiazem (CARDIZEM CD) 120 MG 24 hr capsule Take 1 capsule (120 mg total) by mouth 2 (two) times daily. 180 capsule 2  . dofetilide (TIKOSYN) 125 MCG capsule Take 1 capsule (125 mcg total) by mouth 2 (two) times daily. 180 capsule 3  . furosemide (LASIX) 40 MG tablet Take 1 tablet (40 mg total) by mouth daily. May take an additional 1/2 tablet (20mg ) daily if needed for fluid retention.    . magnesium oxide (MAG-OX) 400 (241.3 Mg) MG tablet Take 1 tablet (400 mg total) by mouth daily. 30 tablet 6  . Multiple Vitamin (MULTIVITAMIN WITH MINERALS) TABS tablet Take 1 tablet by mouth daily.     . polyethylene glycol powder (GLYCOLAX/MIRALAX) powder Take 17 g by mouth daily. 3350 g 0  . potassium chloride SA (K-DUR,KLOR-CON) 20 MEQ tablet Take 1 tablet (20 mEq total) by mouth daily. Take an extra 1/2 tablet by mouth on days when you take the extra fluid pill (furosemide/Lasix).    . Vitamin D, Ergocalciferol, (DRISDOL) 1.25 MG (50000 UT) CAPS capsule TAKE 1 CAPSULE BY MOUTH EVERY 7 DAYS 4 capsule 0  . pantoprazole (PROTONIX) 40 MG tablet Take 1 tablet (40 mg total) by mouth daily. 45 tablet 0   No current facility-administered medications on file prior to visit.     PAST MEDICAL HISTORY: Past Medical History:  Diagnosis Date  . Asthma   . Atrial fibrillation ([place])   . Back pain   . Diastolic dysfunction   . Fibromyalgia   . Gestational diabetes   . Knee pain   . Osteoarthritis   . Persistent atrial fibrillation   . Sleep apnea   . Visit for monitoring Tikosyn therapy 02/07/2018    PAST SURGICAL HISTORY: Past Surgical History:  Procedure Laterality Date  . ABDOMINAL HYSTERECTOMY    . ABLATION OF DYSRHYTHMIC FOCUS  03/09/2018  . APPENDECTOMY    . ATRIAL FIBRILLATION ABLATION N/A 03/09/2018   Procedure: ATRIAL FIBRILLATION ABLATION;  Surgeon: [name], MD;  Location: [place] [facility];  Service: Cardiovascular;  Laterality: N/A;    . CARDIOVERSION N/A 07/04/2017   Procedure: CARDIOVERSION;  Surgeon: [name], MD;  Location: [facility] ENDOSCOPY;  Service: Cardiovascular;  Laterality: N/A;  . CARDIOVERSION N/A 09/06/2017   Procedure: CARDIOVERSION;  Surgeon: [name], MD;  Location: [facility] ENDOSCOPY;  Service: Cardiovascular;  Laterality: N/A;  . CHOLECYSTECTOMY N/A 02/24/2015   Procedure: LAPAROSCOPIC CHOLECYSTECTOMY;  Surgeon: [name], MD;  Location: [place];  Service: General;  Laterality: N/A;  . RIGHT/LEFT HEART CATH AND CORONARY ANGIOGRAPHY N/A 08/09/2017   Procedure: RIGHT/LEFT HEART CATH AND CORONARY ANGIOGRAPHY;  Surgeon: [name], MD;  Location: [place] [facility];  Service: Cardiovascular;  Laterality: N/A;  . TEE WITHOUT CARDIOVERSION N/A 07/04/2017   Procedure: TRANSESOPHAGEAL ECHOCARDIOGRAM (TEE);  Surgeon: [name], MD;  Location: [facility] ENDOSCOPY;  Service: Cardiovascular;  Laterality: N/A;  . TEE WITHOUT CARDIOVERSION N/A 03/09/2018   Procedure: TRANSESOPHAGEAL ECHOCARDIOGRAM (TEE);  Surgeon: Thayer Headings, MD;  Location: [facility] ENDOSCOPY;  Service: Cardiovascular;  Laterality: N/A;    SOCIAL HISTORY: Social History   Tobacco Use  . Smoking status: Never Smoker  . Smokeless tobacco: Never Used  Substance Use Topics  . Alcohol use: Yes    Comment: little  . Drug use: No    FAMILY HISTORY: Family History  Problem Relation Age of Onset  . Heart disease Mother   . Hypertension Mother   . Hyperlipidemia Mother   . Diabetes Mother   . Sudden death Mother   . Thyroid disease Mother   . Obesity Mother   . Heart disease Father   . Heart attack Father   . Hypertension Father   . Hyperlipidemia Father   . Diabetes Father     ROS: Review of Systems  Constitutional: Positive for malaise/fatigue. Negative for weight loss.  Gastrointestinal: Negative for nausea and vomiting.  Musculoskeletal:       Negative muscle weakness  Endo/Heme/Allergies:       Negative hypoglycemia    PHYSICAL  EXAM: Blood pressure 112/77, pulse 74, temperature 97.8 F (36.6 C), temperature source Oral, height 5\' 5"  (1.651 m), weight 232 lb (105.2 kg), SpO2 98 %. Body mass index is 38.61 kg/m. Physical Exam Vitals signs reviewed.  Constitutional:      Appearance: Normal appearance. She is obese.  Cardiovascular:     Rate and Rhythm: Normal rate.     Pulses: Normal pulses.  Pulmonary:     Effort: Pulmonary effort is normal.  Musculoskeletal: Normal range of motion.  Skin:    General: Skin is warm and dry.  Neurological:     Mental Status: She is alert and oriented to person, place, and time.  Psychiatric:        Mood and Affect: Mood normal.        Behavior: Behavior normal.     RECENT LABS AND TESTS: BMET    Component Value Date/Time   NA 141 05/17/2018 1154   K 4.4 05/17/2018 1154   CL 102 05/17/2018 1154   CO2 24 05/17/2018 1154   GLUCOSE 101 (H) 05/17/2018 1154   GLUCOSE 101 (H) 03/10/2018 0508   BUN 16 05/17/2018 1154   CREATININE 0.90 05/17/2018 1154   CALCIUM 9.4 05/17/2018 1154   GFRNONAA 68 05/17/2018 1154   GFRAA 79 05/17/2018 1154   Lab Results  Component Value Date   HGBA1C 6.0 (H) 05/17/2018   HGBA1C 5.8 (H) 11/02/2017   HGBA1C 5.8 (H) 06/26/2017   Lab Results  Component Value Date   INSULIN 16.1 05/17/2018   INSULIN 8.5 11/02/2017   CBC    Component Value Date/Time   WBC 8.5 03/08/2018 1442   WBC 7.4 08/31/2017 0930   RBC 4.46 03/08/2018 1442   RBC 4.63 08/31/2017 0930   HGB 14.0 03/08/2018 1442   HCT 42.2 03/08/2018 1442   PLT 256 03/08/2018 1442   MCV 95 03/08/2018 1442   MCH 31.4 03/08/2018 1442   MCH 30.2 08/31/2017 0930   MCHC 33.2 03/08/2018 1442   MCHC 33.2 08/31/2017 0930   RDW 13.4 03/08/2018 1442   LYMPHSABS 2.5 03/08/2018 1442   MONOABS 1.2 (H) 02/23/2015 0619   EOSABS 0.1 03/08/2018 1442   BASOSABS 0.0 03/08/2018 1442   Iron/TIBC/Ferritin/ %Sat No results found for: IRON, TIBC, FERRITIN, IRONPCTSAT Lipid Panel       Component Value Date/Time   CHOL 172 05/17/2018 1154   TRIG 119 05/17/2018 1154   HDL 33 (L) 05/17/2018 1154  CHOLHDL 5.4 06/26/2017 0517   VLDL 15 06/26/2017 0517   LDLCALC 115 (H) 05/17/2018 1154   Hepatic Function Panel     Component Value Date/Time   PROT 7.5 05/17/2018 1154   ALBUMIN 4.2 05/17/2018 1154   AST 24 05/17/2018 1154   ALT 15 05/17/2018 1154   ALKPHOS 86 05/17/2018 1154   BILITOT 0.6 05/17/2018 1154      Component Value Date/Time   TSH 3.010 11/02/2017 1254   TSH 4.410 06/24/2017 1046   TSH 1.260 02/22/2015 1954      OBESITY BEHAVIORAL INTERVENTION VISIT  Today's visit was # 11   Starting weight: 249 lbs Starting date: 11/02/17 Today's weight : 232 lbs  Today's date: 06/08/2018 Total lbs lost to date: 17 At least 15 minutes were spent on discussing the following behavioral intervention visit.   ASK: We discussed the diagnosis of obesity with Christie Atkinson today and Christie Atkinson agreed to give [name] permission to discuss obesity behavioral modification therapy today.  ASSESS: Maygen has the diagnosis of obesity and her BMI today is 38.61 Laurelin is in the action stage of change   ADVISE: Christie Atkinson was educated on the multiple health risks of obesity as well as the benefit of weight loss to improve her health. She was advised of the need for long term treatment and the importance of lifestyle modifications to improve her current health and to decrease her risk of future health problems.  AGREE: Multiple dietary modification options and treatment options were discussed and  Christie Atkinson agreed to follow the recommendations documented in the above note.  ARRANGE: Christie Atkinson was educated on the importance of frequent visits to treat obesity as outlined per CMS and USPSTF guidelines and agreed to schedule her next follow up appointment today.  I, Trixie Dredge, am acting as transcriptionist for Ilene Qua, MD  I have reviewed the above documentation for  accuracy and completeness, and I agree with the above. - Ilene Qua, MD

## 2018-06-12 ENCOUNTER — Other Ambulatory Visit (INDEPENDENT_AMBULATORY_CARE_PROVIDER_SITE_OTHER): Payer: Self-pay | Admitting: Family Medicine

## 2018-06-12 ENCOUNTER — Encounter: Payer: Self-pay | Admitting: Internal Medicine

## 2018-06-12 ENCOUNTER — Ambulatory Visit (INDEPENDENT_AMBULATORY_CARE_PROVIDER_SITE_OTHER): Payer: Medicare Other | Admitting: Internal Medicine

## 2018-06-12 VITALS — BP 114/70 | HR 70 | Ht 65.0 in | Wt 236.0 lb

## 2018-06-12 DIAGNOSIS — I4819 Other persistent atrial fibrillation: Secondary | ICD-10-CM | POA: Diagnosis not present

## 2018-06-12 DIAGNOSIS — G4733 Obstructive sleep apnea (adult) (pediatric): Secondary | ICD-10-CM

## 2018-06-12 DIAGNOSIS — R7303 Prediabetes: Secondary | ICD-10-CM

## 2018-06-12 DIAGNOSIS — I5032 Chronic diastolic (congestive) heart failure: Secondary | ICD-10-CM | POA: Diagnosis not present

## 2018-06-12 NOTE — Progress Notes (Signed)
PCP: Wallie Char, FNP Primary Cardiologist: Dr Luisa Hart is a 63 y.o. female who presents today for routine electrophysiology followup.  Since his recent afib ablation, the patient reports doing very well.  she denies procedure related complications and is pleased with the results of the procedure.  Today, she denies symptoms of palpitations, chest pain, shortness of breath,  lower extremity edema, dizziness, presyncope, or syncope.  The patient is otherwise without complaint today.   Past Medical History:  Diagnosis Date  . Asthma   . Atrial fibrillation ([place])   . Back pain   . Diastolic dysfunction   . Fibromyalgia   . Gestational diabetes   . Knee pain   . Osteoarthritis   . Persistent atrial fibrillation   . Sleep apnea   . Visit for monitoring Tikosyn therapy 02/07/2018   Past Surgical History:  Procedure Laterality Date  . ABDOMINAL HYSTERECTOMY    . ABLATION OF DYSRHYTHMIC FOCUS  03/09/2018  . APPENDECTOMY    . ATRIAL FIBRILLATION ABLATION N/A 03/09/2018   Procedure: ATRIAL FIBRILLATION ABLATION;  Surgeon: [name], MD;  Location: [place] [facility];  Service: Cardiovascular;  Laterality: N/A;  . CARDIOVERSION N/A 07/04/2017   Procedure: CARDIOVERSION;  Surgeon: [name], MD;  Location: [facility] ENDOSCOPY;  Service: Cardiovascular;  Laterality: N/A;  . CARDIOVERSION N/A 09/06/2017   Procedure: CARDIOVERSION;  Surgeon: [name], MD;  Location: [facility] ENDOSCOPY;  Service: Cardiovascular;  Laterality: N/A;  . CHOLECYSTECTOMY N/A 02/24/2015   Procedure: LAPAROSCOPIC CHOLECYSTECTOMY;  Surgeon: [name], MD;  Location: [place];  Service: General;  Laterality: N/A;  . RIGHT/LEFT HEART CATH AND CORONARY ANGIOGRAPHY N/A 08/09/2017   Procedure: RIGHT/LEFT HEART CATH AND CORONARY ANGIOGRAPHY;  Surgeon: [name], MD;  Location: [place] [facility];  Service: Cardiovascular;  Laterality: N/A;  . TEE WITHOUT CARDIOVERSION N/A 07/04/2017   Procedure:  TRANSESOPHAGEAL ECHOCARDIOGRAM (TEE);  Surgeon: [name], MD;  Location: [name] ENDOSCOPY;  Service: Cardiovascular;  Laterality: N/A;  . TEE WITHOUT CARDIOVERSION N/A 03/09/2018   Procedure: TRANSESOPHAGEAL ECHOCARDIOGRAM (TEE);  Surgeon: [name], MD;  Location: [facility] ENDOSCOPY;  Service: Cardiovascular;  Laterality: N/A;    ROS- all systems are personally reviewed and negatives except as per HPI above  Current Outpatient Medications  Medication Sig Dispense Refill  . acetaminophen (TYLENOL) 325 MG tablet Take 325 mg by mouth every 6 (six) hours as needed for moderate pain or headache.    [name] apixaban (ELIQUIS) 5 MG TABS tablet Take 1 tablet (5 mg total) by mouth 2 (two) times daily. 60 tablet 1  . diltiazem (CARDIZEM CD) 120 MG 24 hr capsule Take 1 capsule (120 mg total) by mouth 2 (two) times daily. 180 capsule 2  . dofetilide (TIKOSYN) 125 MCG capsule Take 1 capsule (125 mcg total) by mouth 2 (two) times daily. 180 capsule 3  . furosemide (LASIX) 40 MG tablet Take 1 tablet (40 mg total) by mouth daily. May take an additional 1/2 tablet (20mg ) daily if needed for fluid retention.    . magnesium oxide (MAG-OX) 400 (241.3 Mg) MG tablet Take 1 tablet (400 mg total) by mouth daily. 30 tablet 6  . metFORMIN (GLUCOPHAGE) 500 MG tablet Take 1 tablet (500 mg total) by mouth daily with breakfast. 30 tablet 0  . Multiple Vitamin (MULTIVITAMIN WITH MINERALS) TABS tablet Take 1 tablet by mouth daily.     . polyethylene glycol powder (GLYCOLAX/MIRALAX) powder Take 17 g by mouth daily.  3350 g 0  . potassium chloride SA (K-DUR,KLOR-CON) 20 MEQ tablet Take 1 tablet (20 mEq total) by mouth daily. Take an extra 1/2 tablet by mouth on days when you take the extra fluid pill (furosemide/Lasix).    . Vitamin D, Ergocalciferol, (DRISDOL) 1.25 MG (50000 UT) CAPS capsule Take 1 capsule (50,000 Units total) by mouth every 7 (seven) days. TAKE 1 CAPSULE BY MOUTH EVERY 7 DAYS 12 capsule 0  . pantoprazole  (PROTONIX) 40 MG tablet Take 1 tablet (40 mg total) by mouth daily. 45 tablet 0   No current facility-administered medications for this visit.     Physical Exam: Vitals:   06/12/18 1132  BP: 114/70  Pulse: 70  SpO2: 97%  Weight: 236 lb (107 kg)  Height: 5\' 5"  (1.651 m)    GEN- The patient is well appearing, alert and oriented x 3 today.   Head- normocephalic, atraumatic Eyes-  Sclera clear, conjunctiva pink Ears- hearing intact Oropharynx- clear Lungs- Clear to ausculation bilaterally, normal work of breathing Heart- Regular rate and rhythm, no murmurs, rubs or gallops, PMI not laterally displaced GI- soft, NT, ND, + BS Extremities- no clubbing, cyanosis, or edema  EKG tracing ordered today is personally reviewed and shows sinus rhythm  Assessment and Plan:  1. Persistent atrial fibrillation Doing well s/p ablation chads2vasc score is 2-3 (female, EF 40% on recent TEE, pre-diabetic) continue eliquis.  Could consider stopping eliquis if EF recovers continue tikosyn for 3 months, consider stopping tikosyn at that time  2. OSA Follows with Drs Toy Cookey and Turner  3. Obesity Body mass index is 39.27 kg/m. Wt Readings from Last 3 Encounters:  06/12/18 236 lb (107 kg)  06/08/18 232 lb (105.2 kg)  05/17/18 231 lb (104.8 kg)   Lifestyle modification encouarged  4. Chronic diastolic dysfunction Stable No change required today Repeat echo on follow-up in AF clinic in 3 months given result of recent TEE  Return to see Butch Penny in the AF clinic every 3 months  [name] MD, [facility] 06/12/2018 11:38 AM

## 2018-06-12 NOTE — Patient Instructions (Addendum)
Medication Instructions:  Your physician recommends that you continue on your current medications as directed. Please refer to the Current Medication list given to you today.  Labwork: None ordered.  Testing/Procedures: None ordered.  Follow-Up: Your physician wants you to follow-up in: 3 months with Donna Carroll NP at the afib clinic.      Any Other Special Instructions Will Be Listed Below (If Applicable).  If you need a refill on your cardiac medications before your next appointment, please call your pharmacy.   

## 2018-06-21 ENCOUNTER — Encounter (INDEPENDENT_AMBULATORY_CARE_PROVIDER_SITE_OTHER): Payer: Self-pay | Admitting: Family Medicine

## 2018-06-21 ENCOUNTER — Ambulatory Visit (INDEPENDENT_AMBULATORY_CARE_PROVIDER_SITE_OTHER): Payer: Medicare Other | Admitting: Family Medicine

## 2018-06-21 VITALS — BP 105/66 | HR 75 | Temp 97.6°F | Ht 65.0 in | Wt 229.0 lb

## 2018-06-21 DIAGNOSIS — E559 Vitamin D deficiency, unspecified: Secondary | ICD-10-CM

## 2018-06-21 DIAGNOSIS — Z6838 Body mass index (BMI) 38.0-38.9, adult: Secondary | ICD-10-CM

## 2018-06-21 DIAGNOSIS — R7303 Prediabetes: Secondary | ICD-10-CM | POA: Diagnosis not present

## 2018-06-21 MED ORDER — VITAMIN D (ERGOCALCIFEROL) 1.25 MG (50000 UNIT) PO CAPS: 50000.0000 [IU] | ORAL_CAPSULE | ORAL | 0 refills | Status: DC

## 2018-06-21 MED ORDER — WOMENS MULTIVITAMIN PO TABS: 1.0000 | ORAL_TABLET | Freq: Every day | ORAL | 0 refills | Status: DC

## 2018-06-21 NOTE — Progress Notes (Signed)
Office: [phone]  /  Fax: [phone]   HPI:   Chief Complaint: OBESITY Christie Atkinson is here to discuss her progress with her obesity treatment plan. She is on the Category 2 plan and is following her eating plan approximately 85-90 % of the time. She states she is exercising 0 minutes 0 times per week. Christie Atkinson has been busy keeping up with house cleaning and with court battles. She is enjoying the meal plan still and noticed carbohydrate cravings improved with metformin initiation.  Her weight is 229 lb (103.9 kg) today and has had a weight loss of 3 pounds over a period of 2 weeks since her last visit. She has lost 20 lbs since starting treatment with [name].  Vitamin D Deficiency Christie Atkinson has a diagnosis of vitamin D deficiency. She is currently taking prescription Vit D. She notes fatigue and denies nausea, vomiting or muscle weakness.  Pre-Diabetes Christie Atkinson has a diagnosis of pre-diabetes based on her elevated Hgb A1c and was informed this puts her at greater risk of developing diabetes. She is biting metformin in 1/3 for 2 days and has taken full pill the last 2 days, and she has felt well. She continues to work on diet and exercise to decrease risk of diabetes. She denies nausea or hypoglycemia.  ASSESSMENT AND PLAN:  Vitamin D deficiency - Plan: Vitamin D, Ergocalciferol, (DRISDOL) 1.25 MG (50000 UT) CAPS capsule  Prediabetes - Plan: Multiple Vitamins-Minerals (WOMENS MULTIVITAMIN) TABS  Class 2 severe obesity with serious comorbidity and body mass index (BMI) of 38.0 to 38.9 in adult, unspecified obesity type ([place])  PLAN:  Vitamin D Deficiency Christie Atkinson was informed that low vitamin D levels contributes to fatigue and are associated with obesity, breast, and colon cancer. Christie Atkinson agrees to continue taking prescription Vit D @50 ,000 IU every week #4 and we will refill for 1 month. She will follow up for routine testing of vitamin D, at least 2-3 times per year. She was informed of the  risk of over-replacement of vitamin D and agrees to not increase her dose unless she discusses this with [name] first. Christie Atkinson agrees to follow up with our clinic in 2 weeks.  Pre-Diabetes Christie Atkinson will continue to work on weight loss, exercise, and decreasing simple carbohydrates in her diet to help decrease the risk of diabetes. We dicussed metformin including benefits and risks. She was informed that eating too many simple carbohydrates or too many calories at one sitting increases the likelihood of GI side effects. Christie Atkinson agrees to continue taking metformin and she agrees to start Women's daily multivitamin #90 with no refills. Christie Atkinson agrees to follow up with our clinic in 2 weeks as directed to monitor her progress.  Obesity Christie Atkinson is currently in the action stage of change. As such, her goal is to continue with weight loss efforts She has agreed to follow the Category 2 plan Christie Atkinson has been instructed to work up to a goal of 150 minutes of combined cardio and strengthening exercise per week for weight loss and overall health benefits. We discussed the following Behavioral Modification Strategies today: increasing lean protein intake, increasing vegetables and work on meal planning and easy cooking plans, and planning for success   Christie Atkinson has agreed to follow up with our clinic in 2 weeks. She was informed of the importance of frequent follow up visits to maximize her success with intensive lifestyle modifications for her multiple health conditions.  ALLERGIES: Allergies  Allergen Reactions  . Hydrocodone-Acetaminophen Nausea And Vomiting  . Other  Other (See Comments)    Metals Polyester - including hospital gowns and sheets - allergic contact dermatitis  . Sulfur Hives    MEDICATIONS: Current Outpatient Medications on File Prior to Visit  Medication Sig Dispense Refill  . acetaminophen (TYLENOL) 325 MG tablet Take 325 mg by mouth every 6 (six) hours as needed for moderate pain or  headache.    Christie Atkinson Kitchen apixaban (ELIQUIS) 5 MG TABS tablet Take 1 tablet (5 mg total) by mouth 2 (two) times daily. 60 tablet 1  . diltiazem (CARDIZEM CD) 120 MG 24 hr capsule Take 1 capsule (120 mg total) by mouth 2 (two) times daily. 180 capsule 2  . dofetilide (TIKOSYN) 125 MCG capsule Take 1 capsule (125 mcg total) by mouth 2 (two) times daily. 180 capsule 3  . furosemide (LASIX) 40 MG tablet Take 1 tablet (40 mg total) by mouth daily. May take an additional 1/2 tablet (20mg ) daily if needed for fluid retention.    . magnesium oxide (MAG-OX) 400 (241.3 Mg) MG tablet Take 1 tablet (400 mg total) by mouth daily. 30 tablet 6  . metFORMIN (GLUCOPHAGE) 500 MG tablet Take 1 tablet (500 mg total) by mouth daily with breakfast. 30 tablet 0  . Multiple Vitamin (MULTIVITAMIN WITH MINERALS) TABS tablet Take 1 tablet by mouth daily.     . polyethylene glycol powder (GLYCOLAX/MIRALAX) powder Take 17 g by mouth daily. 3350 g 0  . potassium chloride SA (K-DUR,KLOR-CON) 20 MEQ tablet Take 1 tablet (20 mEq total) by mouth daily. Take an extra 1/2 tablet by mouth on days when you take the extra fluid pill (furosemide/Lasix).    . pantoprazole (PROTONIX) 40 MG tablet Take 1 tablet (40 mg total) by mouth daily. 45 tablet 0   No current facility-administered medications on file prior to visit.     PAST MEDICAL HISTORY: Past Medical History:  Diagnosis Date  . Asthma   . Atrial fibrillation ([place])   . Back pain   . Diastolic dysfunction   . Fibromyalgia   . Gestational diabetes   . Knee pain   . Osteoarthritis   . Persistent atrial fibrillation   . Sleep apnea   . Visit for monitoring Tikosyn therapy 02/07/2018    PAST SURGICAL HISTORY: Past Surgical History:  Procedure Laterality Date  . ABDOMINAL HYSTERECTOMY    . ABLATION OF DYSRHYTHMIC FOCUS  03/09/2018  . APPENDECTOMY    . ATRIAL FIBRILLATION ABLATION N/A 03/09/2018   Procedure: ATRIAL FIBRILLATION ABLATION;  Surgeon: [name], MD;  Location: [facility];  Service: Cardiovascular;  Laterality: N/A;  . CARDIOVERSION N/A 07/04/2017   Procedure: CARDIOVERSION;  Surgeon: [name], MD;  Location: [facility] ENDOSCOPY;  Service: Cardiovascular;  Laterality: N/A;  . CARDIOVERSION N/A 09/06/2017   Procedure: CARDIOVERSION;  Surgeon: [name], MD;  Location: [facility] ENDOSCOPY;  Service: Cardiovascular;  Laterality: N/A;  . CHOLECYSTECTOMY N/A 02/24/2015   Procedure: LAPAROSCOPIC CHOLECYSTECTOMY;  Surgeon: [name], MD;  Location: [place];  Service: General;  Laterality: N/A;  . RIGHT/LEFT HEART CATH AND CORONARY ANGIOGRAPHY N/A 08/09/2017   Procedure: RIGHT/LEFT HEART CATH AND CORONARY ANGIOGRAPHY;  Surgeon: [name], MD;  Location: [place] [facility];  Service: Cardiovascular;  Laterality: N/A;  . TEE WITHOUT CARDIOVERSION N/A 07/04/2017   Procedure: TRANSESOPHAGEAL ECHOCARDIOGRAM (TEE);  Surgeon: [name], MD;  Location: [facility] ENDOSCOPY;  Service: Cardiovascular;  Laterality: N/A;  . TEE WITHOUT CARDIOVERSION N/A 03/09/2018   Procedure: TRANSESOPHAGEAL ECHOCARDIOGRAM (TEE);  Surgeon: Acie Fredrickson,  Wonda Cheng, MD;  Location: [facility] ENDOSCOPY;  Service: Cardiovascular;  Laterality: N/A;    SOCIAL HISTORY: Social History   Tobacco Use  . Smoking status: Never Smoker  . Smokeless tobacco: Never Used  Substance Use Topics  . Alcohol use: Yes    Comment: little  . Drug use: No    FAMILY HISTORY: Family History  Problem Relation Age of Onset  . Heart disease Mother   . Hypertension Mother   . Hyperlipidemia Mother   . Diabetes Mother   . Sudden death Mother   . Thyroid disease Mother   . Obesity Mother   . Heart disease Father   . Heart attack Father   . Hypertension Father   . Hyperlipidemia Father   . Diabetes Father     ROS: Review of Systems  Constitutional: Positive for malaise/fatigue and weight loss.  Gastrointestinal: Negative for nausea and vomiting.  Musculoskeletal:       Negative muscle weakness    Endo/Heme/Allergies:       Negative hypoglycemia    PHYSICAL EXAM: Blood pressure 105/66, pulse 75, temperature 97.6 F (36.4 C), temperature source Oral, height 5\' 5"  (1.651 m), weight 229 lb (103.9 kg), SpO2 96 %. Body mass index is 38.11 kg/m. Physical Exam Vitals signs reviewed.  Constitutional:      Appearance: Normal appearance. She is obese.  Cardiovascular:     Rate and Rhythm: Normal rate.     Pulses: Normal pulses.  Pulmonary:     Effort: Pulmonary effort is normal.     Breath sounds: Normal breath sounds.  Musculoskeletal: Normal range of motion.  Skin:    General: Skin is warm and dry.  Neurological:     Mental Status: She is alert and oriented to person, place, and time.  Psychiatric:        Mood and Affect: Mood normal.        Behavior: Behavior normal.     RECENT LABS AND TESTS: BMET    Component Value Date/Time   NA 141 05/17/2018 1154   K 4.4 05/17/2018 1154   CL 102 05/17/2018 1154   CO2 24 05/17/2018 1154   GLUCOSE 101 (H) 05/17/2018 1154   GLUCOSE 101 (H) 03/10/2018 0508   BUN 16 05/17/2018 1154   CREATININE 0.90 05/17/2018 1154   CALCIUM 9.4 05/17/2018 1154   GFRNONAA 68 05/17/2018 1154   GFRAA 79 05/17/2018 1154   Lab Results  Component Value Date   HGBA1C 6.0 (H) 05/17/2018   HGBA1C 5.8 (H) 11/02/2017   HGBA1C 5.8 (H) 06/26/2017   Lab Results  Component Value Date   INSULIN 16.1 05/17/2018   INSULIN 8.5 11/02/2017   CBC    Component Value Date/Time   WBC 8.5 03/08/2018 1442   WBC 7.4 08/31/2017 0930   RBC 4.46 03/08/2018 1442   RBC 4.63 08/31/2017 0930   HGB 14.0 03/08/2018 1442   HCT 42.2 03/08/2018 1442   PLT 256 03/08/2018 1442   MCV 95 03/08/2018 1442   MCH 31.4 03/08/2018 1442   MCH 30.2 08/31/2017 0930   MCHC 33.2 03/08/2018 1442   MCHC 33.2 08/31/2017 0930   RDW 13.4 03/08/2018 1442   LYMPHSABS 2.5 03/08/2018 1442   MONOABS 1.2 (H) 02/23/2015 0619   EOSABS 0.1 03/08/2018 1442   BASOSABS 0.0 03/08/2018 1442    Iron/TIBC/Ferritin/ %Sat No results found for: IRON, TIBC, FERRITIN, IRONPCTSAT Lipid Panel     Component Value Date/Time   CHOL 172 05/17/2018 1154   TRIG  119 05/17/2018 1154   HDL 33 (L) 05/17/2018 1154   CHOLHDL 5.4 06/26/2017 0517   VLDL 15 06/26/2017 0517   LDLCALC 115 (H) 05/17/2018 1154   Hepatic Function Panel     Component Value Date/Time   PROT 7.5 05/17/2018 1154   ALBUMIN 4.2 05/17/2018 1154   AST 24 05/17/2018 1154   ALT 15 05/17/2018 1154   ALKPHOS 86 05/17/2018 1154   BILITOT 0.6 05/17/2018 1154      Component Value Date/Time   TSH 3.010 11/02/2017 1254   TSH 4.410 06/24/2017 1046   TSH 1.260 02/22/2015 1954      OBESITY BEHAVIORAL INTERVENTION VISIT  Today's visit was # 12   Starting weight: 249 lbs Starting date: 5/29/1 Today's weight : 229 lbs Today's date: 06/21/2018 Total lbs lost to date: 20 At least 15 minutes were spent on discussing the following behavioral intervention visit.   ASK: We discussed the diagnosis of obesity with Christie Atkinson today and Daijah agreed to give [name] permission to discuss obesity behavioral modification therapy today.  ASSESS: Christie Atkinson has the diagnosis of obesity and her BMI today is 38.11 Christie Atkinson is in the action stage of change   ADVISE: Christie Atkinson was educated on the multiple health risks of obesity as well as the benefit of weight loss to improve her health. She was advised of the need for long term treatment and the importance of lifestyle modifications to improve her current health and to decrease her risk of future health problems.  AGREE: Multiple dietary modification options and treatment options were discussed and  Christie Atkinson agreed to follow the recommendations documented in the above note.  ARRANGE: Christie Atkinson was educated on the importance of frequent visits to treat obesity as outlined per CMS and USPSTF guidelines and agreed to schedule her next follow up appointment today.  I, Trixie Dredge, am  acting as transcriptionist for Ilene Qua, MD  I have reviewed the above documentation for accuracy and completeness, and I agree with the above. - Ilene Qua, MD

## 2018-07-09 ENCOUNTER — Other Ambulatory Visit (INDEPENDENT_AMBULATORY_CARE_PROVIDER_SITE_OTHER): Payer: Self-pay | Admitting: Family Medicine

## 2018-07-09 DIAGNOSIS — R7303 Prediabetes: Secondary | ICD-10-CM

## 2018-07-11 ENCOUNTER — Encounter (INDEPENDENT_AMBULATORY_CARE_PROVIDER_SITE_OTHER): Payer: Self-pay | Admitting: Family Medicine

## 2018-07-11 ENCOUNTER — Ambulatory Visit (INDEPENDENT_AMBULATORY_CARE_PROVIDER_SITE_OTHER): Payer: Medicare Other | Admitting: Family Medicine

## 2018-07-11 VITALS — BP 104/71 | HR 75 | Temp 97.3°F | Ht 65.0 in | Wt 227.0 lb

## 2018-07-11 DIAGNOSIS — Z6837 Body mass index (BMI) 37.0-37.9, adult: Secondary | ICD-10-CM

## 2018-07-11 DIAGNOSIS — E559 Vitamin D deficiency, unspecified: Secondary | ICD-10-CM | POA: Diagnosis not present

## 2018-07-11 DIAGNOSIS — R7303 Prediabetes: Secondary | ICD-10-CM | POA: Diagnosis not present

## 2018-07-11 MED ORDER — VITAMIN D (ERGOCALCIFEROL) 1.25 MG (50000 UNIT) PO CAPS: 50000.0000 [IU] | ORAL_CAPSULE | ORAL | 0 refills | Status: DC

## 2018-07-12 NOTE — Progress Notes (Signed)
Office: [phone]  /  Fax: [phone]   HPI:   Chief Complaint: OBESITY Christie Atkinson is here to discuss her progress with her obesity treatment plan. She is on the Category 2 plan and is following her eating plan approximately 90 % of the time. She states she is exercising 0 minutes 0 times per week. Christie Atkinson has been suffering with heal spur and plantar fascitis recently, and has been taking Tylenol. She did have some indulgent eating with Super Bowl food, chili and tortilla chips. Her focus has changed from obsessing about food to just enjoying life.  Her weight is 227 lb (103 kg) today and has had a weight loss of 2 pounds over a period of 3 weeks since her last visit. She has lost 22 lbs since starting treatment with [name].  Vitamin D Deficiency Christie Atkinson has a diagnosis of vitamin D deficiency. She is currently taking prescription Vit D. She notes fatigue and denies nausea, vomiting or muscle weakness.  Pre-Diabetes Christie Atkinson has a diagnosis of pre-diabetes based on her elevated Hgb A1c and was informed this puts her at greater risk of developing diabetes. She denies GI side effects of metformin or carbohydrate cravings. She continues to work on diet and exercise to decrease risk of diabetes. She denies hypoglycemia.  ASSESSMENT AND PLAN:  Vitamin D deficiency - Plan: Vitamin D, Ergocalciferol, (DRISDOL) 1.25 MG (50000 UT) CAPS capsule  Prediabetes  Class 2 severe obesity with serious comorbidity and body mass index (BMI) of 37.0 to 37.9 in adult, unspecified obesity type ([place])  PLAN:  Vitamin D Deficiency Christie Atkinson was informed that low vitamin D levels contributes to fatigue and are associated with obesity, breast, and colon cancer. [name] agrees to continue taking prescription Vit D @50 ,000 IU every week #4 and we will refill for 1 month. She will follow up for routine testing of vitamin D, at least 2-3 times per year. She was informed of the risk of over-replacement of vitamin D and  agrees to not increase her dose unless she discusses this with [name] first. [name] agrees to follow up with our clinic in 2 weeks.  Pre-Diabetes Christie Atkinson will continue to work on weight loss, exercise, and decreasing simple carbohydrates in her diet to help decrease the risk of diabetes. We dicussed metformin including benefits and risks. She was informed that eating too many simple carbohydrates or too many calories at one sitting increases the likelihood of GI side effects. Christie Atkinson agrees to continue taking metformin, no refill needed and she agrees to follow up with our clinic in 2 weeks as directed to monitor her progress.  Obesity Christie Atkinson is currently in the action stage of change. As such, her goal is to continue with weight loss efforts She has agreed to follow the Category 2 plan Christie Atkinson has been instructed to work up to a goal of 150 minutes of combined cardio and strengthening exercise per week for weight loss and overall health benefits. We discussed the following Behavioral Modification Strategies today: increasing lean protein intake, increasing vegetables, work on meal planning and easy cooking plans, better snacking choices, and planning for success   Christie Atkinson has agreed to follow up with our clinic in 2 weeks. She was informed of the importance of frequent follow up visits to maximize her success with intensive lifestyle modifications for her multiple health conditions.  ALLERGIES: Allergies  Allergen Reactions  . Hydrocodone-Acetaminophen Nausea And Vomiting  . Other Other (See Comments)    Metals Polyester - including hospital gowns and sheets -  allergic contact dermatitis  . Sulfur Hives    MEDICATIONS: Current Outpatient Medications on File Prior to Visit  Medication Sig Dispense Refill  . acetaminophen (TYLENOL) 325 MG tablet Take 325 mg by mouth every 6 (six) hours as needed for moderate pain or headache.    Christie Atkinson apixaban (ELIQUIS) 5 MG TABS tablet Take 1 tablet (5 mg  total) by mouth 2 (two) times daily. 60 tablet 1  . diltiazem (CARDIZEM CD) 120 MG 24 hr capsule Take 1 capsule (120 mg total) by mouth 2 (two) times daily. 180 capsule 2  . dofetilide (TIKOSYN) 125 MCG capsule Take 1 capsule (125 mcg total) by mouth 2 (two) times daily. 180 capsule 3  . furosemide (LASIX) 40 MG tablet Take 1 tablet (40 mg total) by mouth daily. May take an additional 1/2 tablet (20mg ) daily if needed for fluid retention.    . magnesium oxide (MAG-OX) 400 (241.3 Mg) MG tablet Take 1 tablet (400 mg total) by mouth daily. 30 tablet 6  . metFORMIN (GLUCOPHAGE) 500 MG tablet Take 1 tablet (500 mg total) by mouth daily with breakfast. 30 tablet 0  . Multiple Vitamin (MULTIVITAMIN WITH MINERALS) TABS tablet Take 1 tablet by mouth daily.     . Multiple Vitamins-Minerals (WOMENS MULTIVITAMIN) TABS Take 1 tablet by mouth daily. 90 tablet 0  . polyethylene glycol powder (GLYCOLAX/MIRALAX) powder Take 17 g by mouth daily. 3350 g 0  . potassium chloride SA (K-DUR,KLOR-CON) 20 MEQ tablet Take 1 tablet (20 mEq total) by mouth daily. Take an extra 1/2 tablet by mouth on days when you take the extra fluid pill (furosemide/Lasix).    . pantoprazole (PROTONIX) 40 MG tablet Take 1 tablet (40 mg total) by mouth daily. 45 tablet 0   No current facility-administered medications on file prior to visit.     PAST MEDICAL HISTORY: Past Medical History:  Diagnosis Date  . Asthma   . Atrial fibrillation ([place])   . Back pain   . Diastolic dysfunction   . Fibromyalgia   . Gestational diabetes   . Knee pain   . Osteoarthritis   . Persistent atrial fibrillation   . Sleep apnea   . Visit for monitoring Tikosyn therapy 02/07/2018    PAST SURGICAL HISTORY: Past Surgical History:  Procedure Laterality Date  . ABDOMINAL HYSTERECTOMY    . ABLATION OF DYSRHYTHMIC FOCUS  03/09/2018  . APPENDECTOMY    . ATRIAL FIBRILLATION ABLATION N/A 03/09/2018   Procedure: ATRIAL FIBRILLATION ABLATION;  Surgeon:  [name], MD;  Location: [place] [facility];  Service: Cardiovascular;  Laterality: N/A;  . CARDIOVERSION N/A 07/04/2017   Procedure: CARDIOVERSION;  Surgeon: [name], MD;  Location: [facility] ENDOSCOPY;  Service: Cardiovascular;  Laterality: N/A;  . CARDIOVERSION N/A 09/06/2017   Procedure: CARDIOVERSION;  Surgeon: [name], MD;  Location: [facility] ENDOSCOPY;  Service: Cardiovascular;  Laterality: N/A;  . CHOLECYSTECTOMY N/A 02/24/2015   Procedure: LAPAROSCOPIC CHOLECYSTECTOMY;  Surgeon: [name], MD;  Location: [place];  Service: General;  Laterality: N/A;  . RIGHT/LEFT HEART CATH AND CORONARY ANGIOGRAPHY N/A 08/09/2017   Procedure: RIGHT/LEFT HEART CATH AND CORONARY ANGIOGRAPHY;  Surgeon: [name], MD;  Location: [place] [facility];  Service: Cardiovascular;  Laterality: N/A;  . TEE WITHOUT CARDIOVERSION N/A 07/04/2017   Procedure: TRANSESOPHAGEAL ECHOCARDIOGRAM (TEE);  Surgeon: [name], MD;  Location: [facility] ENDOSCOPY;  Service: Cardiovascular;  Laterality: N/A;  . TEE WITHOUT CARDIOVERSION N/A 03/09/2018   Procedure: TRANSESOPHAGEAL ECHOCARDIOGRAM (TEE);  Surgeon:  Nahser, Wonda Cheng, MD;  Location: [facility] ENDOSCOPY;  Service: Cardiovascular;  Laterality: N/A;    SOCIAL HISTORY: Social History   Tobacco Use  . Smoking status: Never Smoker  . Smokeless tobacco: Never Used  Substance Use Topics  . Alcohol use: Yes    Comment: little  . Drug use: No    FAMILY HISTORY: Family History  Problem Relation Age of Onset  . Heart disease Mother   . Hypertension Mother   . Hyperlipidemia Mother   . Diabetes Mother   . Sudden death Mother   . Thyroid disease Mother   . Obesity Mother   . Heart disease Father   . Heart attack Father   . Hypertension Father   . Hyperlipidemia Father   . Diabetes Father     ROS: Review of Systems  Constitutional: Positive for malaise/fatigue and weight loss.  Gastrointestinal: Negative for nausea and vomiting.  Musculoskeletal:       Negative  muscle weakness  Endo/Heme/Allergies:       Negative hypoglycemia    PHYSICAL EXAM: Blood pressure 104/71, pulse 75, temperature (!) 97.3 F (36.3 C), temperature source Oral, height 5\' 5"  (1.651 m), weight 227 lb (103 kg), SpO2 97 %. Body mass index is 37.77 kg/m. Physical Exam Vitals signs reviewed.  Constitutional:      Appearance: Normal appearance. She is obese.  Cardiovascular:     Rate and Rhythm: Normal rate.     Pulses: Normal pulses.  Pulmonary:     Effort: Pulmonary effort is normal.     Breath sounds: Normal breath sounds.  Musculoskeletal: Normal range of motion.  Skin:    General: Skin is warm and dry.  Neurological:     Mental Status: She is alert and oriented to person, place, and time.  Psychiatric:        Mood and Affect: Mood normal.        Behavior: Behavior normal.     RECENT LABS AND TESTS: BMET    Component Value Date/Time   NA 141 05/17/2018 1154   K 4.4 05/17/2018 1154   CL 102 05/17/2018 1154   CO2 24 05/17/2018 1154   GLUCOSE 101 (H) 05/17/2018 1154   GLUCOSE 101 (H) 03/10/2018 0508   BUN 16 05/17/2018 1154   CREATININE 0.90 05/17/2018 1154   CALCIUM 9.4 05/17/2018 1154   GFRNONAA 68 05/17/2018 1154   GFRAA 79 05/17/2018 1154   Lab Results  Component Value Date   HGBA1C 6.0 (H) 05/17/2018   HGBA1C 5.8 (H) 11/02/2017   HGBA1C 5.8 (H) 06/26/2017   Lab Results  Component Value Date   INSULIN 16.1 05/17/2018   INSULIN 8.5 11/02/2017   CBC    Component Value Date/Time   WBC 8.5 03/08/2018 1442   WBC 7.4 08/31/2017 0930   RBC 4.46 03/08/2018 1442   RBC 4.63 08/31/2017 0930   HGB 14.0 03/08/2018 1442   HCT 42.2 03/08/2018 1442   PLT 256 03/08/2018 1442   MCV 95 03/08/2018 1442   MCH 31.4 03/08/2018 1442   MCH 30.2 08/31/2017 0930   MCHC 33.2 03/08/2018 1442   MCHC 33.2 08/31/2017 0930   RDW 13.4 03/08/2018 1442   LYMPHSABS 2.5 03/08/2018 1442   MONOABS 1.2 (H) 02/23/2015 0619   EOSABS 0.1 03/08/2018 1442   BASOSABS 0.0  03/08/2018 1442   Iron/TIBC/Ferritin/ %Sat No results found for: IRON, TIBC, FERRITIN, IRONPCTSAT Lipid Panel     Component Value Date/Time   CHOL 172 05/17/2018 1154  TRIG 119 05/17/2018 1154   HDL 33 (L) 05/17/2018 1154   CHOLHDL 5.4 06/26/2017 0517   VLDL 15 06/26/2017 0517   LDLCALC 115 (H) 05/17/2018 1154   Hepatic Function Panel     Component Value Date/Time   PROT 7.5 05/17/2018 1154   ALBUMIN 4.2 05/17/2018 1154   AST 24 05/17/2018 1154   ALT 15 05/17/2018 1154   ALKPHOS 86 05/17/2018 1154   BILITOT 0.6 05/17/2018 1154      Component Value Date/Time   TSH 3.010 11/02/2017 1254   TSH 4.410 06/24/2017 1046   TSH 1.260 02/22/2015 1954      OBESITY BEHAVIORAL INTERVENTION VISIT  Today's visit was # 31   Starting weight: 249 lbs Starting date: 11/02/17 Today's weight : 227 lbs  Today's date: 07/11/2018 Total lbs lost to date: 22 At least 15 minutes were spent on discussing the following behavioral intervention visit.   ASK: We discussed the diagnosis of obesity with [name] today and Koral agreed to give [name] permission to discuss obesity behavioral modification therapy today.  ASSESS: Khaniya has the diagnosis of obesity and her BMI today is 37.77 Derriona is in the action stage of change   ADVISE: Gizel was educated on the multiple health risks of obesity as well as the benefit of weight loss to improve her health. She was advised of the need for long term treatment and the importance of lifestyle modifications to improve her current health and to decrease her risk of future health problems.  AGREE: Multiple dietary modification options and treatment options were discussed and  Shalika agreed to follow the recommendations documented in the above note.  ARRANGE: Oreoluwa was educated on the importance of frequent visits to treat obesity as outlined per CMS and USPSTF guidelines and agreed to schedule her next follow up appointment today.  I,  Trixie Dredge, am acting as transcriptionist for Ilene Qua, MD  I have reviewed the above documentation for accuracy and completeness, and I agree with the above. - Ilene Qua, MD

## 2018-07-31 ENCOUNTER — Ambulatory Visit (INDEPENDENT_AMBULATORY_CARE_PROVIDER_SITE_OTHER): Payer: Medicare Other | Admitting: Family Medicine

## 2018-07-31 ENCOUNTER — Encounter (INDEPENDENT_AMBULATORY_CARE_PROVIDER_SITE_OTHER): Payer: Self-pay | Admitting: Family Medicine

## 2018-07-31 VITALS — BP 108/69 | HR 78 | Temp 97.6°F | Ht 65.0 in | Wt 229.0 lb

## 2018-07-31 DIAGNOSIS — Z6838 Body mass index (BMI) 38.0-38.9, adult: Secondary | ICD-10-CM | POA: Diagnosis not present

## 2018-07-31 DIAGNOSIS — E559 Vitamin D deficiency, unspecified: Secondary | ICD-10-CM

## 2018-07-31 DIAGNOSIS — R7303 Prediabetes: Secondary | ICD-10-CM

## 2018-07-31 MED ORDER — VITAMIN D (ERGOCALCIFEROL) 1.25 MG (50000 UNIT) PO CAPS: 50000.0000 [IU] | ORAL_CAPSULE | ORAL | 0 refills | Status: DC

## 2018-08-01 NOTE — Progress Notes (Signed)
Office: [phone]  /  Fax: [phone]   HPI:   Chief Complaint: OBESITY Christie Atkinson is here to discuss her progress with her obesity treatment plan. She is on the Category 2 plan and is following her eating plan approximately 90 % of the time. She states she is walking around the house 7 times per week. Christie Atkinson has been feeling slightly under the weather the past few days, but trying to overcome illness. She had hyaluronic acid injections in both knees last week. She is doing fair following the plan but indulged slightly in Valentine's candy and cake. She has decreased PO intake secondary to illness.  Her weight is 229 lb (103.9 kg) today and has gained 2 pounds since her last visit. She has lost 20 lbs since starting treatment with [name].  Vitamin D Deficiency Christie Atkinson has a diagnosis of vitamin D deficiency. She is currently taking prescription Vit D. She notes fatigue and denies nausea, vomiting or muscle weakness.  Pre-Diabetes Christie Atkinson has a diagnosis of pre-diabetes based on her elevated Hgb A1c and was informed this puts her at greater risk of developing diabetes. She denies GI side effects of metformin. She notes decrease in hunger (but questions if this is related to current illness). She continues to work on diet and exercise to decrease risk of diabetes. She denies nausea or hypoglycemia.  ASSESSMENT AND PLAN:  Vitamin D deficiency - Plan: Vitamin D, Ergocalciferol, (DRISDOL) 1.25 MG (50000 UT) CAPS capsule  Prediabetes  Class 2 severe obesity with serious comorbidity and body mass index (BMI) of 38.0 to 38.9 in adult, unspecified obesity type ([place])  PLAN:  Vitamin D Deficiency Essynce was informed that low vitamin D levels contributes to fatigue and are associated with obesity, breast, and colon cancer. Zylpha agrees to continue taking prescription Vit D @50 ,000 IU every week #4 and we will refill for 1 month. She will follow up for routine testing of vitamin D, at least 2-3  times per year. She was informed of the risk of over-replacement of vitamin D and agrees to not increase her dose unless she discusses this with [name] first. Shayley agrees to follow up with our clinic in 2 weeks.  Pre-Diabetes [name] will continue to work on weight loss, exercise, and decreasing simple carbohydrates in her diet to help decrease the risk of diabetes. We dicussed metformin including benefits and risks. She was informed that eating too many simple carbohydrates or too many calories at one sitting increases the likelihood of GI side effects. Christie Atkinson agrees to continue taking metformin and we will follow up at next appointment. Christie Atkinson agrees to follow up with our clinic in 2 weeks as directed to monitor her progress.  Obesity Christie Atkinson is currently in the action stage of change. As such, her goal is to continue with weight loss efforts She has agreed to follow the Category 2 plan Christie Atkinson has been instructed to work up to a goal of 150 minutes of combined cardio and strengthening exercise per week for weight loss and overall health benefits. We discussed the following Behavioral Modification Strategies today: increasing lean protein intake, increasing vegetables and work on meal planning and easy cooking plans, and planning for success   Christie Atkinson has agreed to follow up with our clinic in 2 weeks. She was informed of the importance of frequent follow up visits to maximize her success with intensive lifestyle modifications for her multiple health conditions.  ALLERGIES: Allergies  Allergen Reactions  . Hydrocodone-Acetaminophen Nausea And Vomiting  . Other  Other (See Comments)    Metals Polyester - including hospital gowns and sheets - allergic contact dermatitis  . Sulfur Hives    MEDICATIONS: Current Outpatient Medications on File Prior to Visit  Medication Sig Dispense Refill  . acetaminophen (TYLENOL) 325 MG tablet Take 325 mg by mouth every 6 (six) hours as needed for moderate  pain or headache.    . diltiazem (CARDIZEM CD) 120 MG 24 hr capsule Take 1 capsule (120 mg total) by mouth 2 (two) times daily. 180 capsule 2  . dofetilide (TIKOSYN) 125 MCG capsule Take 1 capsule (125 mcg total) by mouth 2 (two) times daily. 180 capsule 3  . furosemide (LASIX) 40 MG tablet Take 1 tablet (40 mg total) by mouth daily. May take an additional 1/2 tablet (20mg ) daily if needed for fluid retention.    . magnesium oxide (MAG-OX) 400 (241.3 Mg) MG tablet Take 1 tablet (400 mg total) by mouth daily. 30 tablet 6  . metFORMIN (GLUCOPHAGE) 500 MG tablet Take 1 tablet (500 mg total) by mouth daily with breakfast. 30 tablet 0  . Multiple Vitamin (MULTIVITAMIN WITH MINERALS) TABS tablet Take 1 tablet by mouth daily.     . Multiple Vitamins-Minerals (WOMENS MULTIVITAMIN) TABS Take 1 tablet by mouth daily. 90 tablet 0  . pantoprazole (PROTONIX) 40 MG tablet Take 1 tablet (40 mg total) by mouth daily. 45 tablet 0  . polyethylene glycol powder (GLYCOLAX/MIRALAX) powder Take 17 g by mouth daily. 3350 g 0  . potassium chloride SA (K-DUR,KLOR-CON) 20 MEQ tablet Take 1 tablet (20 mEq total) by mouth daily. Take an extra 1/2 tablet by mouth on days when you take the extra fluid pill (furosemide/Lasix).     No current facility-administered medications on file prior to visit.     PAST MEDICAL HISTORY: Past Medical History:  Diagnosis Date  . Asthma   . Atrial fibrillation ([place])   . Back pain   . Diastolic dysfunction   . Fibromyalgia   . Gestational diabetes   . Knee pain   . Osteoarthritis   . Persistent atrial fibrillation   . Sleep apnea   . Visit for monitoring Tikosyn therapy 02/07/2018    PAST SURGICAL HISTORY: Past Surgical History:  Procedure Laterality Date  . ABDOMINAL HYSTERECTOMY    . ABLATION OF DYSRHYTHMIC FOCUS  03/09/2018  . APPENDECTOMY    . ATRIAL FIBRILLATION ABLATION N/A 03/09/2018   Procedure: ATRIAL FIBRILLATION ABLATION;  Surgeon: [name], MD;  Location: [facility];  Service: Cardiovascular;  Laterality: N/A;  . CARDIOVERSION N/A 07/04/2017   Procedure: CARDIOVERSION;  Surgeon: [name], MD;  Location: [facility] ENDOSCOPY;  Service: Cardiovascular;  Laterality: N/A;  . CARDIOVERSION N/A 09/06/2017   Procedure: CARDIOVERSION;  Surgeon: [name], MD;  Location: [facility] ENDOSCOPY;  Service: Cardiovascular;  Laterality: N/A;  . CHOLECYSTECTOMY N/A 02/24/2015   Procedure: LAPAROSCOPIC CHOLECYSTECTOMY;  Surgeon: [name], MD;  Location: [place];  Service: General;  Laterality: N/A;  . RIGHT/LEFT HEART CATH AND CORONARY ANGIOGRAPHY N/A 08/09/2017   Procedure: RIGHT/LEFT HEART CATH AND CORONARY ANGIOGRAPHY;  Surgeon: [name], MD;  Location: [place] [facility];  Service: Cardiovascular;  Laterality: N/A;  . TEE WITHOUT CARDIOVERSION N/A 07/04/2017   Procedure: TRANSESOPHAGEAL ECHOCARDIOGRAM (TEE);  Surgeon: [name], MD;  Location: [facility] ENDOSCOPY;  Service: Cardiovascular;  Laterality: N/A;  . TEE WITHOUT CARDIOVERSION N/A 03/09/2018   Procedure: TRANSESOPHAGEAL ECHOCARDIOGRAM (TEE);  Surgeon: Thayer Headings, MD;  Location: [facility] ENDOSCOPY;  Service: Cardiovascular;  Laterality: N/A;    SOCIAL HISTORY: Social History   Tobacco Use  . Smoking status: Never Smoker  . Smokeless tobacco: Never Used  Substance Use Topics  . Alcohol use: Yes    Comment: little  . Drug use: No    FAMILY HISTORY: Family History  Problem Relation Age of Onset  . Heart disease Mother   . Hypertension Mother   . Hyperlipidemia Mother   . Diabetes Mother   . Sudden death Mother   . Thyroid disease Mother   . Obesity Mother   . Heart disease Father   . Heart attack Father   . Hypertension Father   . Hyperlipidemia Father   . Diabetes Father     ROS: Review of Systems  Constitutional: Positive for malaise/fatigue. Negative for weight loss.  Gastrointestinal: Negative for nausea and vomiting.  Musculoskeletal:       Negative muscle weakness    Endo/Heme/Allergies:       Negative hypoglycemia    PHYSICAL EXAM: Blood pressure 108/69, pulse 78, temperature 97.6 F (36.4 C), temperature source Oral, height 5\' 5"  (1.651 m), weight 229 lb (103.9 kg), SpO2 96 %. Body mass index is 38.11 kg/m. Physical Exam Vitals signs reviewed.  Constitutional:      Appearance: Normal appearance. She is obese.  Cardiovascular:     Rate and Rhythm: Normal rate.     Pulses: Normal pulses.  Pulmonary:     Effort: Pulmonary effort is normal.     Breath sounds: Normal breath sounds.  Musculoskeletal: Normal range of motion.  Skin:    General: Skin is warm and dry.  Neurological:     Mental Status: She is alert and oriented to person, place, and time.  Psychiatric:        Mood and Affect: Mood normal.        Behavior: Behavior normal.     RECENT LABS AND TESTS: BMET    Component Value Date/Time   NA 141 05/17/2018 1154   K 4.4 05/17/2018 1154   CL 102 05/17/2018 1154   CO2 24 05/17/2018 1154   GLUCOSE 101 (H) 05/17/2018 1154   GLUCOSE 101 (H) 03/10/2018 0508   BUN 16 05/17/2018 1154   CREATININE 0.90 05/17/2018 1154   CALCIUM 9.4 05/17/2018 1154   GFRNONAA 68 05/17/2018 1154   GFRAA 79 05/17/2018 1154   Lab Results  Component Value Date   HGBA1C 6.0 (H) 05/17/2018   HGBA1C 5.8 (H) 11/02/2017   HGBA1C 5.8 (H) 06/26/2017   Lab Results  Component Value Date   INSULIN 16.1 05/17/2018   INSULIN 8.5 11/02/2017   CBC    Component Value Date/Time   WBC 8.5 03/08/2018 1442   WBC 7.4 08/31/2017 0930   RBC 4.46 03/08/2018 1442   RBC 4.63 08/31/2017 0930   HGB 14.0 03/08/2018 1442   HCT 42.2 03/08/2018 1442   PLT 256 03/08/2018 1442   MCV 95 03/08/2018 1442   MCH 31.4 03/08/2018 1442   MCH 30.2 08/31/2017 0930   MCHC 33.2 03/08/2018 1442   MCHC 33.2 08/31/2017 0930   RDW 13.4 03/08/2018 1442   LYMPHSABS 2.5 03/08/2018 1442   MONOABS 1.2 (H) 02/23/2015 0619   EOSABS 0.1 03/08/2018 1442   BASOSABS 0.0 03/08/2018 1442    Iron/TIBC/Ferritin/ %Sat No results found for: IRON, TIBC, FERRITIN, IRONPCTSAT Lipid Panel     Component Value Date/Time   CHOL 172 05/17/2018 1154   TRIG 119 05/17/2018 1154   HDL 33 (  L) 05/17/2018 1154   CHOLHDL 5.4 06/26/2017 0517   VLDL 15 06/26/2017 0517   LDLCALC 115 (H) 05/17/2018 1154   Hepatic Function Panel     Component Value Date/Time   PROT 7.5 05/17/2018 1154   ALBUMIN 4.2 05/17/2018 1154   AST 24 05/17/2018 1154   ALT 15 05/17/2018 1154   ALKPHOS 86 05/17/2018 1154   BILITOT 0.6 05/17/2018 1154      Component Value Date/Time   TSH 3.010 11/02/2017 1254   TSH 4.410 06/24/2017 1046   TSH 1.260 02/22/2015 1954      OBESITY BEHAVIORAL INTERVENTION VISIT  Today's visit was # 14   Starting weight: 249 lbs Starting date: 11/02/17 Today's weight : 229 lbs  Today's date: 07/31/2018 Total lbs lost to date: 20 At least 15 minutes were spent on discussing the following behavioral intervention visit.   ASK: We discussed the diagnosis of obesity with [name] today and Loretto agreed to give [name] permission to discuss obesity behavioral modification therapy today.  ASSESS: Chereese has the diagnosis of obesity and her BMI today is 38.11 Belisa is in the action stage of change   ADVISE: Trenna was educated on the multiple health risks of obesity as well as the benefit of weight loss to improve her health. She was advised of the need for long term treatment and the importance of lifestyle modifications to improve her current health and to decrease her risk of future health problems.  AGREE: Multiple dietary modification options and treatment options were discussed and  Deatrice agreed to follow the recommendations documented in the above note.  ARRANGE: Ivyanna was educated on the importance of frequent visits to treat obesity as outlined per CMS and USPSTF guidelines and agreed to schedule her next follow up appointment today.  I, Trixie Dredge, am  acting as transcriptionist for Ilene Qua, MD  I have reviewed the above documentation for accuracy and completeness, and I agree with the above. - Ilene Qua, MD

## 2018-08-16 ENCOUNTER — Encounter (INDEPENDENT_AMBULATORY_CARE_PROVIDER_SITE_OTHER): Payer: Self-pay | Admitting: Family Medicine

## 2018-08-16 ENCOUNTER — Ambulatory Visit (INDEPENDENT_AMBULATORY_CARE_PROVIDER_SITE_OTHER): Payer: Medicare Other | Admitting: Family Medicine

## 2018-08-16 ENCOUNTER — Other Ambulatory Visit: Payer: Self-pay

## 2018-08-16 VITALS — BP 96/61 | HR 73 | Temp 97.8°F | Ht 65.0 in | Wt 233.0 lb

## 2018-08-16 DIAGNOSIS — Z6838 Body mass index (BMI) 38.0-38.9, adult: Secondary | ICD-10-CM

## 2018-08-16 DIAGNOSIS — E559 Vitamin D deficiency, unspecified: Secondary | ICD-10-CM

## 2018-08-16 DIAGNOSIS — R7303 Prediabetes: Secondary | ICD-10-CM | POA: Diagnosis not present

## 2018-08-16 MED ORDER — VITAMIN D (ERGOCALCIFEROL) 1.25 MG (50000 UNIT) PO CAPS: 50000.0000 [IU] | ORAL_CAPSULE | ORAL | 0 refills | Status: DC

## 2018-08-16 NOTE — Progress Notes (Signed)
Office: [phone]  /  Fax: [phone]   HPI:   Chief Complaint: OBESITY Christie Atkinson is here to discuss her progress with her obesity treatment plan. She is on the Category 2 plan and is following her eating plan approximately 90 % of the time. She states she is cleaning and walking 10-45 minutes 5-7 times per week. Christie Atkinson has been in increased pain for the past 2 weeks and has been going to an acupuncturist 2 times a week. She has been trying to eat more protein. Her weight is 233 lb (105.7 kg) today and has gained 4 lbs  since her last visit. She has lost 16 lbs since starting treatment with [name].  Vitamin D deficiency Christie Atkinson has a diagnosis of vitamin D deficiency. She is currently taking prescription Vit D and denies nausea, vomiting or muscle weakness. She reports fatigue.  Pre-Diabetes Christie Atkinson has a diagnosis of prediabetes based on her elevated Hgb A1c and was informed this puts her at greater risk of developing diabetes. She is taking metformin currently and continues to work on diet and exercise to decrease risk of diabetes. She denies GI side effects of metformin. She denies carb cravings.  ASSESSMENT AND PLAN:  Vitamin D deficiency - Plan: Vitamin D, Ergocalciferol, (DRISDOL) 1.25 MG (50000 UT) CAPS capsule  Prediabetes  Class 2 severe obesity with serious comorbidity and body mass index (BMI) of 38.0 to 38.9 in adult, unspecified obesity type ([place])  PLAN:  Vitamin D Deficiency Shanty was informed that low vitamin D levels contributes to fatigue and are associated with obesity, breast, and colon cancer. She agrees to continue to take prescription Vit D @50 ,000 IU every week #4 with no refills and will follow up for routine testing of vitamin D, at least 2-3 times per year. She was informed of the risk of over-replacement of vitamin D and agrees to not increase her dose unless she discusses this with [name] first. Careena agrees to follow up with our clinic in 2 weeks.   Pre-Diabetes Christie Atkinson will continue to work on weight loss, exercise, and decreasing simple carbohydrates in her diet to help decrease the risk of diabetes. We dicussed metformin including benefits and risks. She was informed that eating too many simple carbohydrates or too many calories at one sitting increases the likelihood of GI side effects. [name] will continue taking metformin for now and a prescription was not written today. Katalia agrees to follow up with our clinic in 2 weeks.  Obesity Christie Atkinson is currently in the action stage of change. As such, her goal is to continue with weight loss efforts She has agreed to follow the Category 2 plan Christie Atkinson has been instructed to be more mindful of only 200 calories /snack/ day. She will continue to work up to a goal of 150 minutes of combined cardio and strengthening exercise per week for weight loss and overall health benefits. We discussed the following Behavioral Modification Strategies today: increasing lean protein intake, increasing vegetables, work on meal planning and easy cooking plans, better snacking choices and planning for success  Christie Atkinson has agreed to follow up with our clinic in 2 weeks. She was informed of the importance of frequent follow up visits to maximize her success with intensive lifestyle modifications for her multiple health conditions.  ALLERGIES: Allergies  Allergen Reactions  . Hydrocodone-Acetaminophen Nausea And Vomiting  . Other Other (See Comments)    Metals Polyester - including hospital gowns and sheets - allergic contact dermatitis  . Sulfur Hives  MEDICATIONS: Current Outpatient Medications on File Prior to Visit  Medication Sig Dispense Refill  . acetaminophen (TYLENOL) 325 MG tablet Take 325 mg by mouth every 6 (six) hours as needed for moderate pain or headache.    . diltiazem (CARDIZEM CD) 120 MG 24 hr capsule Take 1 capsule (120 mg total) by mouth 2 (two) times daily. 180 capsule 2  .  dofetilide (TIKOSYN) 125 MCG capsule Take 1 capsule (125 mcg total) by mouth 2 (two) times daily. 180 capsule 3  . furosemide (LASIX) 40 MG tablet Take 1 tablet (40 mg total) by mouth daily. May take an additional 1/2 tablet (20mg ) daily if needed for fluid retention.    . magnesium oxide (MAG-OX) 400 (241.3 Mg) MG tablet Take 1 tablet (400 mg total) by mouth daily. 30 tablet 6  . metFORMIN (GLUCOPHAGE) 500 MG tablet Take 1 tablet (500 mg total) by mouth daily with breakfast. 30 tablet 0  . Multiple Vitamin (MULTIVITAMIN WITH MINERALS) TABS tablet Take 1 tablet by mouth daily.     . Multiple Vitamins-Minerals (WOMENS MULTIVITAMIN) TABS Take 1 tablet by mouth daily. 90 tablet 0  . pantoprazole (PROTONIX) 40 MG tablet Take 1 tablet (40 mg total) by mouth daily. 45 tablet 0  . polyethylene glycol powder (GLYCOLAX/MIRALAX) powder Take 17 g by mouth daily. 3350 g 0  . potassium chloride SA (K-DUR,KLOR-CON) 20 MEQ tablet Take 1 tablet (20 mEq total) by mouth daily. Take an extra 1/2 tablet by mouth on days when you take the extra fluid pill (furosemide/Lasix).     No current facility-administered medications on file prior to visit.     PAST MEDICAL HISTORY: Past Medical History:  Diagnosis Date  . Asthma   . Atrial fibrillation ([place])   . Back pain   . Diastolic dysfunction   . Fibromyalgia   . Gestational diabetes   . Knee pain   . Osteoarthritis   . Persistent atrial fibrillation   . Sleep apnea   . Visit for monitoring Tikosyn therapy 02/07/2018    PAST SURGICAL HISTORY: Past Surgical History:  Procedure Laterality Date  . ABDOMINAL HYSTERECTOMY    . ABLATION OF DYSRHYTHMIC FOCUS  03/09/2018  . APPENDECTOMY    . ATRIAL FIBRILLATION ABLATION N/A 03/09/2018   Procedure: ATRIAL FIBRILLATION ABLATION;  Surgeon: [name], MD;  Location: [place] [facility];  Service: Cardiovascular;  Laterality: N/A;  . CARDIOVERSION N/A 07/04/2017   Procedure: CARDIOVERSION;  Surgeon: [name],  MD;  Location: [facility] ENDOSCOPY;  Service: Cardiovascular;  Laterality: N/A;  . CARDIOVERSION N/A 09/06/2017   Procedure: CARDIOVERSION;  Surgeon: [name], MD;  Location: [facility] ENDOSCOPY;  Service: Cardiovascular;  Laterality: N/A;  . CHOLECYSTECTOMY N/A 02/24/2015   Procedure: LAPAROSCOPIC CHOLECYSTECTOMY;  Surgeon: [name], MD;  Location: [place];  Service: General;  Laterality: N/A;  . RIGHT/LEFT HEART CATH AND CORONARY ANGIOGRAPHY N/A 08/09/2017   Procedure: RIGHT/LEFT HEART CATH AND CORONARY ANGIOGRAPHY;  Surgeon: [name], MD;  Location: [place] [facility];  Service: Cardiovascular;  Laterality: N/A;  . TEE WITHOUT CARDIOVERSION N/A 07/04/2017   Procedure: TRANSESOPHAGEAL ECHOCARDIOGRAM (TEE);  Surgeon: [name], MD;  Location: [facility] ENDOSCOPY;  Service: Cardiovascular;  Laterality: N/A;  . TEE WITHOUT CARDIOVERSION N/A 03/09/2018   Procedure: TRANSESOPHAGEAL ECHOCARDIOGRAM (TEE);  Surgeon: [name], MD;  Location: [facility] ENDOSCOPY;  Service: Cardiovascular;  Laterality: N/A;    SOCIAL HISTORY: Social History   Tobacco Use  . Smoking status: Never Smoker  .  Smokeless tobacco: Never Used  Substance Use Topics  . Alcohol use: Yes    Comment: little  . Drug use: No    FAMILY HISTORY: Family History  Problem Relation Age of Onset  . Heart disease Mother   . Hypertension Mother   . Hyperlipidemia Mother   . Diabetes Mother   . Sudden death Mother   . Thyroid disease Mother   . Obesity Mother   . Heart disease Father   . Heart attack Father   . Hypertension Father   . Hyperlipidemia Father   . Diabetes Father     ROS: Review of Systems  Constitutional: Positive for malaise/fatigue.  Gastrointestinal: Negative for nausea and vomiting.  Genitourinary:       Negative for polyuria  Musculoskeletal:       Negative for muscle weakness  Endo/Heme/Allergies:       Negative for hypoglycemia Negative for polyphagia    PHYSICAL EXAM: Blood pressure 96/61, pulse  73, temperature 97.8 F (36.6 C), temperature source Oral, height 5\' 5"  (1.651 m), weight 233 lb (105.7 kg), SpO2 95 %. Body mass index is 38.77 kg/m. Physical Exam Vitals signs reviewed.  Constitutional:      Appearance: Normal appearance. She is obese.  Cardiovascular:     Rate and Rhythm: Normal rate.     Pulses: Normal pulses.  Pulmonary:     Effort: Pulmonary effort is normal.  Musculoskeletal: Normal range of motion.  Skin:    General: Skin is warm and dry.  Neurological:     Mental Status: She is alert and oriented to person, place, and time.  Psychiatric:        Mood and Affect: Mood normal.        Behavior: Behavior normal.     RECENT LABS AND TESTS: BMET    Component Value Date/Time   NA 141 05/17/2018 1154   K 4.4 05/17/2018 1154   CL 102 05/17/2018 1154   CO2 24 05/17/2018 1154   GLUCOSE 101 (H) 05/17/2018 1154   GLUCOSE 101 (H) 03/10/2018 0508   BUN 16 05/17/2018 1154   CREATININE 0.90 05/17/2018 1154   CALCIUM 9.4 05/17/2018 1154   GFRNONAA 68 05/17/2018 1154   GFRAA 79 05/17/2018 1154   Lab Results  Component Value Date   HGBA1C 6.0 (H) 05/17/2018   HGBA1C 5.8 (H) 11/02/2017   HGBA1C 5.8 (H) 06/26/2017   Lab Results  Component Value Date   INSULIN 16.1 05/17/2018   INSULIN 8.5 11/02/2017   CBC    Component Value Date/Time   WBC 8.5 03/08/2018 1442   WBC 7.4 08/31/2017 0930   RBC 4.46 03/08/2018 1442   RBC 4.63 08/31/2017 0930   HGB 14.0 03/08/2018 1442   HCT 42.2 03/08/2018 1442   PLT 256 03/08/2018 1442   MCV 95 03/08/2018 1442   MCH 31.4 03/08/2018 1442   MCH 30.2 08/31/2017 0930   MCHC 33.2 03/08/2018 1442   MCHC 33.2 08/31/2017 0930   RDW 13.4 03/08/2018 1442   LYMPHSABS 2.5 03/08/2018 1442   MONOABS 1.2 (H) 02/23/2015 0619   EOSABS 0.1 03/08/2018 1442   BASOSABS 0.0 03/08/2018 1442   Iron/TIBC/Ferritin/ %Sat No results found for: IRON, TIBC, FERRITIN, IRONPCTSAT Lipid Panel     Component Value Date/Time   CHOL 172  05/17/2018 1154   TRIG 119 05/17/2018 1154   HDL 33 (L) 05/17/2018 1154   CHOLHDL 5.4 06/26/2017 0517   VLDL 15 06/26/2017 0517   LDLCALC 115 (H) 05/17/2018 1154  Hepatic Function Panel     Component Value Date/Time   PROT 7.5 05/17/2018 1154   ALBUMIN 4.2 05/17/2018 1154   AST 24 05/17/2018 1154   ALT 15 05/17/2018 1154   ALKPHOS 86 05/17/2018 1154   BILITOT 0.6 05/17/2018 1154      Component Value Date/Time   TSH 3.010 11/02/2017 1254   TSH 4.410 06/24/2017 1046   TSH 1.260 02/22/2015 1954   Results for ETHEL, MEISENHEIMER (MRN [id number]) as of 08/16/2018 15:31  Ref. Range 05/17/2018 11:54  Vitamin D, 25-Hydroxy Latest Ref Range: 30.0 - 100.0 ng/mL 51.1     OBESITY BEHAVIORAL INTERVENTION VISIT  Today's visit was # 15   Starting weight: 249 lbs Starting date: 11/02/2017 Today's weight : Weight: 233 lb (105.7 kg)  Today's date: 08/16/2018 Total lbs lost to date: 16  At least 15 minutes were spent on discussing the following behavioral intervention visit.    08/16/2018  BP 96/61  Temp 97.8 F (36.6 C)  Pulse 73  SpO2 95 %  Weight 233 lb  Height 5\' 5"  (1.651 m)  BMI (Calculated) 38.77    ASK: We discussed the diagnosis of obesity with [name] today and Jamye agreed to give [name] permission to discuss obesity behavioral modification therapy today.  ASSESS: Ha has the diagnosis of obesity and her BMI today is 38.77 Kaeya is in the action stage of change   ADVISE: Anai was educated on the multiple health risks of obesity as well as the benefit of weight loss to improve her health. She was advised of the need for long term treatment and the importance of lifestyle modifications to improve her current health and to decrease her risk of future health problems.  AGREE: Multiple dietary modification options and treatment options were discussed and  Momoko agreed to follow the recommendations documented in the above note.  ARRANGE: Pranavi was  educated on the importance of frequent visits to treat obesity as outlined per CMS and USPSTF guidelines and agreed to schedule her next follow up appointment today.  I, Tammy Wysor, am acting as Location manager for Eber Jones, MD    I have reviewed the above documentation for accuracy and completeness, and I agree with the above. - Ilene Qua, MD

## 2018-08-22 ENCOUNTER — Encounter (INDEPENDENT_AMBULATORY_CARE_PROVIDER_SITE_OTHER): Payer: Self-pay

## 2018-08-29 ENCOUNTER — Encounter (INDEPENDENT_AMBULATORY_CARE_PROVIDER_SITE_OTHER): Payer: Self-pay

## 2018-08-30 ENCOUNTER — Encounter (INDEPENDENT_AMBULATORY_CARE_PROVIDER_SITE_OTHER): Payer: Self-pay

## 2018-08-30 ENCOUNTER — Ambulatory Visit (INDEPENDENT_AMBULATORY_CARE_PROVIDER_SITE_OTHER): Payer: Medicare Other | Admitting: Physician Assistant

## 2018-09-05 ENCOUNTER — Ambulatory Visit (INDEPENDENT_AMBULATORY_CARE_PROVIDER_SITE_OTHER): Payer: Medicare Other | Admitting: Dietician

## 2018-09-11 ENCOUNTER — Ambulatory Visit (HOSPITAL_COMMUNITY): Payer: Medicare Other | Admitting: Nurse Practitioner

## 2018-09-12 ENCOUNTER — Encounter (INDEPENDENT_AMBULATORY_CARE_PROVIDER_SITE_OTHER): Payer: Self-pay | Admitting: Family Medicine

## 2018-09-12 ENCOUNTER — Ambulatory Visit (INDEPENDENT_AMBULATORY_CARE_PROVIDER_SITE_OTHER): Payer: Medicare Other | Admitting: Family Medicine

## 2018-09-12 ENCOUNTER — Other Ambulatory Visit: Payer: Self-pay

## 2018-09-12 DIAGNOSIS — R7303 Prediabetes: Secondary | ICD-10-CM

## 2018-09-12 DIAGNOSIS — E7849 Other hyperlipidemia: Secondary | ICD-10-CM

## 2018-09-12 DIAGNOSIS — Z6838 Body mass index (BMI) 38.0-38.9, adult: Secondary | ICD-10-CM

## 2018-09-12 DIAGNOSIS — R5383 Other fatigue: Secondary | ICD-10-CM

## 2018-09-12 NOTE — Progress Notes (Signed)
Office: [phone]  /  Fax: [phone] TeleHealth Visit:  Christie Atkinson has verbally consented to this TeleHealth visit today. The patient is located at home, the provider is located at the [facility] office. The participants in this visit include the listed provider and patient and provider's assistant. Christie Atkinson was unable to use realtime audiovisual technology today and the telehealth visit was conducted via telephone.   HPI:   Chief Complaint: OBESITY Christie Atkinson is here to discuss her progress with her obesity treatment plan. She is on the Category 2 plan and is following her eating plan approximately 95 % of the time. She states she is walking around the house. Christie Atkinson is feeling as though she is losing fat and noticing her face and arms are looking and feeling thinner. She notes minimal hunger. She is working on getting all protein in and is eating more protein than Category 2 calls for.  We were unable to weigh the patient today for this TeleHealth visit. She feels as if she has maintained her weight since her last visit. She has lost 16 lbs since starting treatment with [name].  Pre-Diabetes Christie Atkinson has a diagnosis of pre-diabetes based on her elevated Hgb A1c and was informed this puts her at greater risk of developing diabetes. She notes minimal occasional carbohydrate cravings. She denies GI side effects of metformin. She continues to work on diet and exercise to decrease risk of diabetes. She denies hypoglycemia.  Hyperlipidemia Christie Atkinson has hyperlipidemia and has been trying to improve her cholesterol levels with intensive lifestyle modification including a low saturated fat diet, exercise and weight loss. Her LDL on last lab draw was 115 and HDL of 33. She denies any chest pain, claudication or myalgias.  Fatigue Christie Atkinson feels her energy is lower than it should be. Last thyroid check about 1 year ago. Vit D was ordered (she is on supplementation).   ASSESSMENT AND  PLAN:  Prediabetes - Plan: Hemoglobin A1c, Insulin, random  Other hyperlipidemia - Plan: Lipid Panel With LDL/HDL Ratio  Other fatigue - Plan: TSH  Class 2 severe obesity with serious comorbidity and body mass index (BMI) of 38.0 to 38.9 in adult, unspecified obesity type ([place])  PLAN:  Pre-Diabetes Christie Atkinson will continue to work on weight loss, exercise, and decreasing simple carbohydrates in her diet to help decrease the risk of diabetes. We dicussed metformin including benefits and risks. She was informed that eating too many simple carbohydrates or too many calories at one sitting increases the likelihood of GI side effects. Christie Atkinson agrees to continue taking metformin, and Hgb A1c and insulin was ordered. Christie Atkinson agrees to follow up with our clinic in 2 weeks as directed to monitor her progress.  Hyperlipidemia Christie Atkinson was informed of the American Heart Association Guidelines emphasizing intensive lifestyle modifications as the first line treatment for hyperlipidemia. We discussed many lifestyle modifications today in depth, and Christie Atkinson will continue to work on decreasing saturated fats such as fatty red meat, butter and many fried foods. She will also increase vegetables and lean protein in her diet and continue to work on exercise and weight loss efforts. FLP was ordered, and Christie Atkinson agrees to follow up with our clinic in 2 weeks.  Fatigue Christie Atkinson was informed that her fatigue may be related to obesity, depression or many other causes. TSH was ordered, and in the meanwhile Christie Atkinson has agreed to work on diet, exercise and weight loss to help with fatigue. Proper sleep hygiene was discussed including the need for  7-8 hours of quality sleep each night.  Obesity Christie Atkinson is currently in the action stage of change. As such, her goal is to continue with weight loss efforts She has agreed to follow the Category 2 plan Christie Atkinson has been instructed to work up to a goal of 150 minutes of combined  cardio and strengthening exercise per week for weight loss and overall health benefits. We discussed the following Behavioral Modification Strategies today: increasing lean protein intake, increasing vegetables and work on meal planning and easy cooking plans, better snacking choices, and planning for success   Christie Atkinson has agreed to follow up with our clinic in 2 weeks. She was informed of the importance of frequent follow up visits to maximize her success with intensive lifestyle modifications for her multiple health conditions.  ALLERGIES: Allergies  Allergen Reactions  . Hydrocodone-Acetaminophen Nausea And Vomiting  . Other Other (See Comments)    Metals Polyester - including hospital gowns and sheets - allergic contact dermatitis  . Sulfur Hives    MEDICATIONS: Current Outpatient Medications on File Prior to Visit  Medication Sig Dispense Refill  . acetaminophen (TYLENOL) 325 MG tablet Take 325 mg by mouth every 6 (six) hours as needed for moderate pain or headache.    . diltiazem (CARDIZEM CD) 120 MG 24 hr capsule Take 1 capsule (120 mg total) by mouth 2 (two) times daily. 180 capsule 2  . dofetilide (TIKOSYN) 125 MCG capsule Take 1 capsule (125 mcg total) by mouth 2 (two) times daily. 180 capsule 3  . furosemide (LASIX) 40 MG tablet Take 1 tablet (40 mg total) by mouth daily. May take an additional 1/2 tablet (20mg ) daily if needed for fluid retention.    . magnesium oxide (MAG-OX) 400 (241.3 Mg) MG tablet Take 1 tablet (400 mg total) by mouth daily. 30 tablet 6  . metFORMIN (GLUCOPHAGE) 500 MG tablet Take 1 tablet (500 mg total) by mouth daily with breakfast. 30 tablet 0  . Multiple Vitamin (MULTIVITAMIN WITH MINERALS) TABS tablet Take 1 tablet by mouth daily.     . Multiple Vitamins-Minerals (WOMENS MULTIVITAMIN) TABS Take 1 tablet by mouth daily. 90 tablet 0  . pantoprazole (PROTONIX) 40 MG tablet Take 1 tablet (40 mg total) by mouth daily. 45 tablet 0  . polyethylene glycol  powder (GLYCOLAX/MIRALAX) powder Take 17 g by mouth daily. 3350 g 0  . potassium chloride SA (K-DUR,KLOR-CON) 20 MEQ tablet Take 1 tablet (20 mEq total) by mouth daily. Take an extra 1/2 tablet by mouth on days when you take the extra fluid pill (furosemide/Lasix).    . Vitamin D, Ergocalciferol, (DRISDOL) 1.25 MG (50000 UT) CAPS capsule Take 1 capsule (50,000 Units total) by mouth every 7 (seven) days. TAKE 1 CAPSULE BY MOUTH EVERY 7 DAYS 12 capsule 0   No current facility-administered medications on file prior to visit.     PAST MEDICAL HISTORY: Past Medical History:  Diagnosis Date  . Asthma   . Atrial fibrillation ([place])   . Back pain   . Diastolic dysfunction   . Fibromyalgia   . Gestational diabetes   . Knee pain   . Osteoarthritis   . Persistent atrial fibrillation   . Sleep apnea   . Visit for monitoring Tikosyn therapy 02/07/2018    PAST SURGICAL HISTORY: Past Surgical History:  Procedure Laterality Date  . ABDOMINAL HYSTERECTOMY    . ABLATION OF DYSRHYTHMIC FOCUS  03/09/2018  . APPENDECTOMY    . ATRIAL FIBRILLATION ABLATION N/A 03/09/2018   Procedure:  ATRIAL FIBRILLATION ABLATION;  Surgeon: [name], MD;  Location: [place] [facility];  Service: Cardiovascular;  Laterality: N/A;  . CARDIOVERSION N/A 07/04/2017   Procedure: CARDIOVERSION;  Surgeon: [name], MD;  Location: [facility] ENDOSCOPY;  Service: Cardiovascular;  Laterality: N/A;  . CARDIOVERSION N/A 09/06/2017   Procedure: CARDIOVERSION;  Surgeon: [name], MD;  Location: [facility] ENDOSCOPY;  Service: Cardiovascular;  Laterality: N/A;  . CHOLECYSTECTOMY N/A 02/24/2015   Procedure: LAPAROSCOPIC CHOLECYSTECTOMY;  Surgeon: [name], MD;  Location: [place];  Service: General;  Laterality: N/A;  . RIGHT/LEFT HEART CATH AND CORONARY ANGIOGRAPHY N/A 08/09/2017   Procedure: RIGHT/LEFT HEART CATH AND CORONARY ANGIOGRAPHY;  Surgeon: [name], MD;  Location: [place] [facility];  Service: Cardiovascular;  Laterality:  N/A;  . TEE WITHOUT CARDIOVERSION N/A 07/04/2017   Procedure: TRANSESOPHAGEAL ECHOCARDIOGRAM (TEE);  Surgeon: [name], MD;  Location: [facility] ENDOSCOPY;  Service: Cardiovascular;  Laterality: N/A;  . TEE WITHOUT CARDIOVERSION N/A 03/09/2018   Procedure: TRANSESOPHAGEAL ECHOCARDIOGRAM (TEE);  Surgeon: [name], MD;  Location: [facility] ENDOSCOPY;  Service: Cardiovascular;  Laterality: N/A;    SOCIAL HISTORY: Social History   Tobacco Use  . Smoking status: Never Smoker  . Smokeless tobacco: Never Used  Substance Use Topics  . Alcohol use: Yes    Comment: little  . Drug use: No    FAMILY HISTORY: Family History  Problem Relation Age of Onset  . Heart disease Mother   . Hypertension Mother   . Hyperlipidemia Mother   . Diabetes Mother   . Sudden death Mother   . Thyroid disease Mother   . Obesity Mother   . Heart disease Father   . Heart attack Father   . Hypertension Father   . Hyperlipidemia Father   . Diabetes Father     ROS: Review of Systems  Constitutional: Positive for malaise/fatigue. Negative for weight loss.  Cardiovascular: Negative for chest pain and claudication.  Musculoskeletal: Negative for myalgias.  Endo/Heme/Allergies:       Negative hypoglycemia    PHYSICAL EXAM: Pt in no acute distress  RECENT LABS AND TESTS: BMET    Component Value Date/Time   NA 141 05/17/2018 1154   K 4.4 05/17/2018 1154   CL 102 05/17/2018 1154   CO2 24 05/17/2018 1154   GLUCOSE 101 (H) 05/17/2018 1154   GLUCOSE 101 (H) 03/10/2018 0508   BUN 16 05/17/2018 1154   CREATININE 0.90 05/17/2018 1154   CALCIUM 9.4 05/17/2018 1154   GFRNONAA 68 05/17/2018 1154   GFRAA 79 05/17/2018 1154   Lab Results  Component Value Date   HGBA1C 6.0 (H) 05/17/2018   HGBA1C 5.8 (H) 11/02/2017   HGBA1C 5.8 (H) 06/26/2017   Lab Results  Component Value Date   INSULIN 16.1 05/17/2018   INSULIN 8.5 11/02/2017   CBC    Component Value Date/Time   WBC 8.5 03/08/2018 1442   WBC  7.4 08/31/2017 0930   RBC 4.46 03/08/2018 1442   RBC 4.63 08/31/2017 0930   HGB 14.0 03/08/2018 1442   HCT 42.2 03/08/2018 1442   PLT 256 03/08/2018 1442   MCV 95 03/08/2018 1442   MCH 31.4 03/08/2018 1442   MCH 30.2 08/31/2017 0930   MCHC 33.2 03/08/2018 1442   MCHC 33.2 08/31/2017 0930   RDW 13.4 03/08/2018 1442   LYMPHSABS 2.5 03/08/2018 1442   MONOABS 1.2 (H) 02/23/2015 0619   EOSABS 0.1 03/08/2018 1442   BASOSABS 0.0 03/08/2018 1442   Iron/TIBC/Ferritin/ %  Sat No results found for: IRON, TIBC, FERRITIN, IRONPCTSAT Lipid Panel     Component Value Date/Time   CHOL 172 05/17/2018 1154   TRIG 119 05/17/2018 1154   HDL 33 (L) 05/17/2018 1154   CHOLHDL 5.4 06/26/2017 0517   VLDL 15 06/26/2017 0517   LDLCALC 115 (H) 05/17/2018 1154   Hepatic Function Panel     Component Value Date/Time   PROT 7.5 05/17/2018 1154   ALBUMIN 4.2 05/17/2018 1154   AST 24 05/17/2018 1154   ALT 15 05/17/2018 1154   ALKPHOS 86 05/17/2018 1154   BILITOT 0.6 05/17/2018 1154      Component Value Date/Time   TSH 3.010 11/02/2017 1254   TSH 4.410 06/24/2017 1046   TSH 1.260 02/22/2015 1954      I, Trixie Dredge, am acting as transcriptionist for Ilene Qua, MD  I have reviewed the above documentation for accuracy and completeness, and I agree with the above. - Ilene Qua, MD

## 2018-09-13 ENCOUNTER — Ambulatory Visit (INDEPENDENT_AMBULATORY_CARE_PROVIDER_SITE_OTHER): Payer: Medicare Other | Admitting: Family Medicine

## 2018-09-14 ENCOUNTER — Other Ambulatory Visit: Payer: Self-pay

## 2018-09-14 ENCOUNTER — Encounter (HOSPITAL_COMMUNITY): Payer: Self-pay | Admitting: Physician Assistant

## 2018-09-14 ENCOUNTER — Ambulatory Visit (HOSPITAL_COMMUNITY)
Admission: RE | Admit: 2018-09-14 | Discharge: 2018-09-14 | Disposition: A | Discharge status: Home / Self Care | Payer: Medicare Other | Source: Ambulatory Visit | Attending: Nurse Practitioner | Admitting: Nurse Practitioner

## 2018-09-14 VITALS — BP 110/64 | HR 73 | Ht 65.0 in | Wt 237.0 lb

## 2018-09-14 DIAGNOSIS — J45909 Unspecified asthma, uncomplicated: Secondary | ICD-10-CM | POA: Diagnosis not present

## 2018-09-14 DIAGNOSIS — I4819 Other persistent atrial fibrillation: Secondary | ICD-10-CM | POA: Diagnosis not present

## 2018-09-14 DIAGNOSIS — Z7984 Long term (current) use of oral hypoglycemic drugs: Secondary | ICD-10-CM | POA: Insufficient documentation

## 2018-09-14 DIAGNOSIS — Z8249 Family history of ischemic heart disease and other diseases of the circulatory system: Secondary | ICD-10-CM | POA: Diagnosis not present

## 2018-09-14 DIAGNOSIS — Z6839 Body mass index (BMI) 39.0-39.9, adult: Secondary | ICD-10-CM | POA: Insufficient documentation

## 2018-09-14 DIAGNOSIS — M797 Fibromyalgia: Secondary | ICD-10-CM | POA: Diagnosis not present

## 2018-09-14 DIAGNOSIS — M1990 Unspecified osteoarthritis, unspecified site: Secondary | ICD-10-CM | POA: Insufficient documentation

## 2018-09-14 DIAGNOSIS — Z79899 Other long term (current) drug therapy: Secondary | ICD-10-CM | POA: Insufficient documentation

## 2018-09-14 DIAGNOSIS — E669 Obesity, unspecified: Secondary | ICD-10-CM | POA: Insufficient documentation

## 2018-09-14 DIAGNOSIS — I4891 Unspecified atrial fibrillation: Secondary | ICD-10-CM

## 2018-09-14 DIAGNOSIS — G4733 Obstructive sleep apnea (adult) (pediatric): Secondary | ICD-10-CM | POA: Insufficient documentation

## 2018-09-14 DIAGNOSIS — I5020 Unspecified systolic (congestive) heart failure: Secondary | ICD-10-CM | POA: Diagnosis not present

## 2018-09-14 LAB — BASIC METABOLIC PANEL
Anion gap: 9 (ref 5–15)
BUN: 23 mg/dL (ref 8–23)
CO2: 26 mmol/L (ref 22–32)
Calcium: 9.2 mg/dL (ref 8.9–10.3)
Chloride: 102 mmol/L (ref 98–111)
Creatinine, Ser: 0.92 mg/dL (ref 0.44–1.00)
GFR calc Af Amer: >60 mL/min (ref >60)
GFR calc non Af Amer: >60 mL/min (ref >60)
Glucose, Bld: 121 mg/dL — ABNORMAL HIGH (ref 70–99)
Potassium: 4 mmol/L (ref 3.5–5.1)
Sodium: 137 mmol/L (ref 135–145)

## 2018-09-14 LAB — MAGNESIUM: Magnesium: 2.1 mg/dL (ref 1.7–2.4)

## 2018-09-14 NOTE — Progress Notes (Signed)
Primary Care Physician: Wallie Char, FNP Referring Physician:Dr. [name] is a 63 y.o. female with a h/o persistent afib that was having  breakthrough afib with Tikosyn and had an afib ablation 03/09/18 with [name]. She reports doing very well and feeling "better than she has in years." No symptoms of afib to report. She stopped her Eliquis after her last visit with Dr [name] although it looks like he meant to continue.   Today, she denies symptoms of palpitations, chest pain, shortness of breath, orthopnea, PND, lower extremity edema, dizziness, presyncope, syncope, or neurologic sequela. The patient is tolerating medications without difficulties and is otherwise without complaint today.   Past Medical History:  Diagnosis Date  . Asthma   . Atrial fibrillation ([place])   . Back pain   . Diastolic dysfunction   . Fibromyalgia   . Gestational diabetes   . Knee pain   . Osteoarthritis   . Persistent atrial fibrillation   . Sleep apnea   . Visit for monitoring Tikosyn therapy 02/07/2018   Past Surgical History:  Procedure Laterality Date  . ABDOMINAL HYSTERECTOMY    . ABLATION OF DYSRHYTHMIC FOCUS  03/09/2018  . APPENDECTOMY    . ATRIAL FIBRILLATION ABLATION N/A 03/09/2018   Procedure: ATRIAL FIBRILLATION ABLATION;  Surgeon: [name], MD;  Location: [place] [facility];  Service: Cardiovascular;  Laterality: N/A;  . CARDIOVERSION N/A 07/04/2017   Procedure: CARDIOVERSION;  Surgeon: [name], MD;  Location: [facility] ENDOSCOPY;  Service: Cardiovascular;  Laterality: N/A;  . CARDIOVERSION N/A 09/06/2017   Procedure: CARDIOVERSION;  Surgeon: [name], MD;  Location: [facility] ENDOSCOPY;  Service: Cardiovascular;  Laterality: N/A;  . CHOLECYSTECTOMY N/A 02/24/2015   Procedure: LAPAROSCOPIC CHOLECYSTECTOMY;  Surgeon: [name], MD;  Location: [place];  Service: General;  Laterality: N/A;  . RIGHT/LEFT HEART CATH AND CORONARY ANGIOGRAPHY N/A 08/09/2017   Procedure:  RIGHT/LEFT HEART CATH AND CORONARY ANGIOGRAPHY;  Surgeon: [name], MD;  Location: [place] [facility];  Service: Cardiovascular;  Laterality: N/A;  . TEE WITHOUT CARDIOVERSION N/A 07/04/2017   Procedure: TRANSESOPHAGEAL ECHOCARDIOGRAM (TEE);  Surgeon: [name], MD;  Location: [facility] ENDOSCOPY;  Service: Cardiovascular;  Laterality: N/A;  . TEE WITHOUT CARDIOVERSION N/A 03/09/2018   Procedure: TRANSESOPHAGEAL ECHOCARDIOGRAM (TEE);  Surgeon: [name], MD;  Location: [facility] ENDOSCOPY;  Service: Cardiovascular;  Laterality: N/A;    Current Outpatient Medications  Medication Sig Dispense Refill  . acetaminophen (TYLENOL) 325 MG tablet Take 325 mg by mouth every 6 (six) hours as needed for moderate pain or headache.    . diltiazem (CARDIZEM CD) 120 MG 24 hr capsule Take 1 capsule (120 mg total) by mouth 2 (two) times daily. 180 capsule 2  . dofetilide (TIKOSYN) 125 MCG capsule Take 1 capsule (125 mcg total) by mouth 2 (two) times daily. 180 capsule 3  . magnesium oxide (MAG-OX) 400 (241.3 Mg) MG tablet Take 1 tablet (400 mg total) by mouth daily. 30 tablet 6  . metFORMIN (GLUCOPHAGE) 500 MG tablet Take 1 tablet (500 mg total) by mouth daily with breakfast. 30 tablet 0  . Multiple Vitamin (MULTIVITAMIN WITH MINERALS) TABS tablet Take 1 tablet by mouth daily.     . Multiple Vitamins-Minerals (WOMENS MULTIVITAMIN) TABS Take 1 tablet by mouth daily. 90 tablet 0  . pantoprazole (PROTONIX) 40 MG tablet Take 1 tablet (40 mg total) by mouth daily. 45 tablet 0  . polyethylene glycol powder (GLYCOLAX/MIRALAX) powder Take 17  g by mouth daily. 3350 g 0  . potassium chloride SA (K-DUR,KLOR-CON) 20 MEQ tablet Take 1 tablet (20 mEq total) by mouth daily. Take an extra 1/2 tablet by mouth on days when you take the extra fluid pill (furosemide/Lasix).    . Vitamin D, Ergocalciferol, (DRISDOL) 1.25 MG (50000 UT) CAPS capsule Take 1 capsule (50,000 Units total) by mouth every 7 (seven) days. TAKE 1 CAPSULE BY  MOUTH EVERY 7 DAYS 12 capsule 0   No current facility-administered medications for this encounter.     Allergies  Allergen Reactions  . Hydrocodone-Acetaminophen Nausea And Vomiting  . Other Other (See Comments)    Metals Polyester - including hospital gowns and sheets - allergic contact dermatitis  . Sulfur Hives    Social History   Socioeconomic History  . Marital status: Married    Spouse name: Not on file  . Number of children: 4  . Years of education: Not on file  . Highest education level: Not on file  Occupational History  . Occupation: Disabled/retired  Social Needs  . Financial resource strain: Not on file  . Food insecurity:    Worry: Not on file    Inability: Not on file  . Transportation needs:    Medical: Not on file    Non-medical: Not on file  Tobacco Use  . Smoking status: Never Smoker  . Smokeless tobacco: Never Used  Substance and Sexual Activity  . Alcohol use: Yes    Comment: little  . Drug use: No  . Sexual activity: Not Currently  Lifestyle  . Physical activity:    Days per week: Not on file    Minutes per session: Not on file  . Stress: Not on file  Relationships  . Social connections:    Talks on phone: Not on file    Gets together: Not on file    Attends religious service: Not on file    Active member of club or organization: Not on file    Attends meetings of clubs or organizations: Not on file    Relationship status: Not on file  . Intimate partner violence:    Fear of current or ex partner: Not on file    Emotionally abused: Not on file    Physically abused: Not on file    Forced sexual activity: Not on file  Other Topics Concern  . Not on file  Social History Narrative   Lives in [place]   Not working    Family History  Problem Relation Age of Onset  . Heart disease Mother   . Hypertension Mother   . Hyperlipidemia Mother   . Diabetes Mother   . Sudden death Mother   . Thyroid disease Mother   . Obesity Mother    . Heart disease Father   . Heart attack Father   . Hypertension Father   . Hyperlipidemia Father   . Diabetes Father     ROS- All systems are reviewed and negative except as per the HPI above  Physical Exam: Vitals:   09/14/18 1433  Weight: 107.5 kg  Height: 5\' 5"  (1.651 m)   Wt Readings from Last 3 Encounters:  09/14/18 107.5 kg  08/16/18 105.7 kg  07/31/18 103.9 kg    Labs: Lab Results  Component Value Date   NA 141 05/17/2018   K 4.4 05/17/2018   CL 102 05/17/2018   CO2 24 05/17/2018   GLUCOSE 101 (H) 05/17/2018   BUN 16 05/17/2018   CREATININE  0.90 05/17/2018   CALCIUM 9.4 05/17/2018   MG 2.0 03/10/2018   Lab Results  Component Value Date   INR 1.07 08/09/2017   Lab Results  Component Value Date   CHOL 172 05/17/2018   HDL 33 (L) 05/17/2018   LDLCALC 115 (H) 05/17/2018   TRIG 119 05/17/2018    GEN- The patient is well appearing obese female, alert and oriented x 3 today.   HEENT-head normocephalic, atraumatic, sclera clear, conjunctiva pink, hearing intact, trachea midline. Lungs- Clear to ausculation bilaterally, normal work of breathing Heart- Regular rate and rhythm, no murmurs, rubs or gallops  GI- soft, NT, ND, + BS Extremities- no clubbing, cyanosis, or edema MS- no significant deformity or atrophy Skin- no rash or lesion Psych- euthymic mood, full affect Neuro- strength and sensation are intact   EKG- SR HR 73, PR 168, QRS 82, QTc 447  Epic records reviewed   Assessment and Plan:  1. Persistent Atrial fibrillation Doing well s/p ablation 03/09/18. Has not had any symptoms. Discussed stopping Tikosyn today and patient prefers to continue medications as she is doing so well. Will consider stopping in the future. Continue dofetilide 125 mcg bid. QT stable. Continue diltiazem daily Patient had stopped Eliquis after last visit with Dr [name] but per his note he had planned on continuing. Will restart Eliquis 5 mg BID. Bmet/mag today  2.  Systolic dysfunction EF 66-59% on TEE Repeat echo once COVID-19 precautions end.  3. Obesity Body mass index is 39.44 kg/m. Lifestyle modification was discussed and encouraged including regular physical activity and weight reduction.  4. OSA Now using oral appliance from Dr Ron Parker. Encouraged compliance.   Follow up with AF clinic in 3 months.  [facility] [address] [place], [place] [address] [phone]

## 2018-09-18 ENCOUNTER — Telehealth: Payer: Self-pay | Admitting: Internal Medicine

## 2018-09-18 NOTE — Telephone Encounter (Signed)
New Message:   Pt wants to know if she is supposed to stilll be taking Eliquis? When she was seen in the AFIB Clinic last week, PA told her she was supposed to be taking it. On her last ov with Dr [name] in January she thought he  told her to stop her Eliquis.  She started back on the Eliquis last week. She needs to clarify if she is really supposed to be on it. She says she have been off of her Eliquis since January. She also wants to know if she is supposed to have an Echo this year?

## 2018-09-20 ENCOUNTER — Ambulatory Visit (INDEPENDENT_AMBULATORY_CARE_PROVIDER_SITE_OTHER): Payer: Medicare Other | Admitting: Family Medicine

## 2018-09-21 DIAGNOSIS — E7849 Other hyperlipidemia: Secondary | ICD-10-CM | POA: Diagnosis not present

## 2018-09-21 DIAGNOSIS — R7303 Prediabetes: Secondary | ICD-10-CM | POA: Diagnosis not present

## 2018-09-21 DIAGNOSIS — R5383 Other fatigue: Secondary | ICD-10-CM | POA: Diagnosis not present

## 2018-09-22 LAB — LIPID PANEL WITH LDL/HDL RATIO
Cholesterol, Total: 170 mg/dL (ref 100–199)
HDL: 32 mg/dL — ABNORMAL LOW (ref >39)
LDL Calculated: 112 mg/dL — ABNORMAL HIGH (ref 0–99)
LDl/HDL Ratio: 3.5 ratio — ABNORMAL HIGH (ref 0.0–3.2)
Triglycerides: 131 mg/dL (ref 0–149)
VLDL Cholesterol Cal: 26 mg/dL (ref 5–40)

## 2018-09-22 LAB — HEMOGLOBIN A1C
Est. average glucose Bld gHb Est-mCnc: 120 mg/dL
Hgb A1c MFr Bld: 5.8 % — ABNORMAL HIGH (ref 4.8–5.6)

## 2018-09-22 LAB — INSULIN, RANDOM: INSULIN: 24.4 u[IU]/mL (ref 2.6–24.9)

## 2018-09-22 LAB — TSH: TSH: 4.61 u[IU]/mL — ABNORMAL HIGH (ref 0.450–4.500)

## 2018-09-25 ENCOUNTER — Ambulatory Visit (INDEPENDENT_AMBULATORY_CARE_PROVIDER_SITE_OTHER): Payer: Medicare Other | Admitting: Family Medicine

## 2018-09-25 ENCOUNTER — Encounter (INDEPENDENT_AMBULATORY_CARE_PROVIDER_SITE_OTHER): Payer: Self-pay | Admitting: Family Medicine

## 2018-09-25 ENCOUNTER — Other Ambulatory Visit: Payer: Self-pay

## 2018-09-25 DIAGNOSIS — E559 Vitamin D deficiency, unspecified: Secondary | ICD-10-CM | POA: Diagnosis not present

## 2018-09-25 DIAGNOSIS — R7303 Prediabetes: Secondary | ICD-10-CM | POA: Diagnosis not present

## 2018-09-25 DIAGNOSIS — Z6839 Body mass index (BMI) 39.0-39.9, adult: Secondary | ICD-10-CM | POA: Diagnosis not present

## 2018-09-27 NOTE — Progress Notes (Signed)
Office: [phone]  /  Fax: [phone] TeleHealth Visit:  Christie Atkinson has verbally consented to this TeleHealth visit today. The patient is located at home, the provider is located at the [employer] [facility] office. The participants in this visit include the listed provider and patient. Christie Atkinson was unable to use realtime audiovisual technology today and the telehealth visit was conducted via telephone.   HPI:   Chief Complaint: OBESITY Christie Atkinson is here to discuss her progress with her obesity treatment plan. She is on the Category 2 plan and is following her eating plan approximately 80-90 % of the time. She states she is exercising 0 minutes 0 times per week. Christie Atkinson has been fairly consistent with the meal plan and noticed her clothes are fitting looser. She was placed back on Eliquis. She has not had any issues finding food on the meal plan. She thinks she is eating more than the plan suggests.  We were unable to weigh the patient today for this TeleHealth visit. She feels as if she has maintained her weight since her last visit. She has lost 16 lbs since starting treatment with [name].  Vitamin D Deficiency Christie Atkinson has a diagnosis of vitamin D deficiency. She is currently taking prescription Vit D. She notes fatigue and denies nausea, vomiting or muscle weakness.  Pre-Diabetes Christie Atkinson has a diagnosis of pre-diabetes based on her elevated Hgb A1c and was informed this puts her at greater risk of developing diabetes. She is taking metformin currently and notes carbohydrate cravings and snacking. She is doing 2 breakfast and often rationalizing excess snacks by eating high protein snacks. She continues to work on diet and exercise to decrease risk of diabetes. She denies nausea or hypoglycemia.  ASSESSMENT AND PLAN:  Vitamin D deficiency  Prediabetes  Class 2 severe obesity with serious comorbidity and body mass index (BMI) of 39.0 to 39.9 in adult, unspecified obesity type  (Christie Atkinson)  PLAN:  Vitamin D Deficiency Christie Atkinson was informed that low vitamin D levels contributes to fatigue and are associated with obesity, breast, and colon cancer. [name] agrees to continue taking prescription Vit D @50 ,000 IU every week, no refill needed. She will follow up for routine testing of vitamin D, at least 2-3 times per year. She was informed of the risk of over-replacement of vitamin D and agrees to not increase her dose unless she discusses this with [name] first. Christie Atkinson agrees to follow up with our clinic in 2 weeks.  Pre-Diabetes Christie Atkinson will continue to work on weight loss, exercise, and decreasing simple carbohydrates in her diet to help decrease the risk of diabetes. We dicussed metformin including benefits and risks. She was informed that eating too many simple carbohydrates or too many calories at one sitting increases the likelihood of GI side effects. Christie Atkinson agrees to continue taking metformin and we will repeat labs within 3 months. Christie Atkinson agrees to follow up with our clinic in 2 weeks as directed to monitor her progress.  Obesity Christie Atkinson is currently in the action stage of change. As such, her goal is to continue with weight loss efforts She has agreed to follow the Category 2 plan Christie Atkinson has been instructed to work up to a goal of 150 minutes of combined cardio and strengthening exercise per week for weight loss and overall health benefits. We discussed the following Behavioral Modification Strategies today: increasing lean protein intake, work on meal planning and easy cooking plans, emotional eating strategies and avoiding temptations, keeping healthy foods in the home,  better snacking choices, and planning for success   Christie Atkinson has agreed to follow up with our clinic in 2 weeks. She was informed of the importance of frequent follow up visits to maximize her success with intensive lifestyle modifications for her multiple health conditions.  ALLERGIES: Allergies   Allergen Reactions  . Hydrocodone-Acetaminophen Nausea And Vomiting  . Other Other (See Comments)    Metals Polyester - including hospital gowns and sheets - allergic contact dermatitis  . Sulfur Hives    MEDICATIONS: Current Outpatient Medications on File Prior to Visit  Medication Sig Dispense Refill  . acetaminophen (TYLENOL) 325 MG tablet Take 325 mg by mouth every 6 (six) hours as needed for moderate pain or headache.    . diltiazem (CARDIZEM CD) 120 MG 24 hr capsule Take 1 capsule (120 mg total) by mouth 2 (two) times daily. 180 capsule 2  . dofetilide (TIKOSYN) 125 MCG capsule Take 1 capsule (125 mcg total) by mouth 2 (two) times daily. 180 capsule 3  . magnesium oxide (MAG-OX) 400 (241.3 Mg) MG tablet Take 1 tablet (400 mg total) by mouth daily. 30 tablet 6  . metFORMIN (GLUCOPHAGE) 500 MG tablet Take 1 tablet (500 mg total) by mouth daily with breakfast. 30 tablet 0  . Multiple Vitamin (MULTIVITAMIN WITH MINERALS) TABS tablet Take 1 tablet by mouth daily.     . Multiple Vitamins-Minerals (WOMENS MULTIVITAMIN) TABS Take 1 tablet by mouth daily. 90 tablet 0  . potassium chloride SA (K-DUR,KLOR-CON) 20 MEQ tablet Take 1 tablet (20 mEq total) by mouth daily. Take an extra 1/2 tablet by mouth on days when you take the extra fluid pill (furosemide/Lasix).    . Vitamin D, Ergocalciferol, (DRISDOL) 1.25 MG (50000 UT) CAPS capsule Take 1 capsule (50,000 Units total) by mouth every 7 (seven) days. TAKE 1 CAPSULE BY MOUTH EVERY 7 DAYS 12 capsule 0   No current facility-administered medications on file prior to visit.     PAST MEDICAL HISTORY: Past Medical History:  Diagnosis Date  . Asthma   . Atrial fibrillation ([place])   . Back pain   . Diastolic dysfunction   . Fibromyalgia   . Gestational diabetes   . Knee pain   . Osteoarthritis   . Persistent atrial fibrillation   . Sleep apnea   . Visit for monitoring Tikosyn therapy 02/07/2018    PAST SURGICAL HISTORY: Past Surgical  History:  Procedure Laterality Date  . ABDOMINAL HYSTERECTOMY    . ABLATION OF DYSRHYTHMIC FOCUS  03/09/2018  . APPENDECTOMY    . ATRIAL FIBRILLATION ABLATION N/A 03/09/2018   Procedure: ATRIAL FIBRILLATION ABLATION;  Surgeon: [name], MD;  Location: [place] [facility];  Service: Cardiovascular;  Laterality: N/A;  . CARDIOVERSION N/A 07/04/2017   Procedure: CARDIOVERSION;  Surgeon: [name], MD;  Location: [facility] ENDOSCOPY;  Service: Cardiovascular;  Laterality: N/A;  . CARDIOVERSION N/A 09/06/2017   Procedure: CARDIOVERSION;  Surgeon: [name], MD;  Location: [facility] ENDOSCOPY;  Service: Cardiovascular;  Laterality: N/A;  . CHOLECYSTECTOMY N/A 02/24/2015   Procedure: LAPAROSCOPIC CHOLECYSTECTOMY;  Surgeon: [name], MD;  Location: [place];  Service: General;  Laterality: N/A;  . RIGHT/LEFT HEART CATH AND CORONARY ANGIOGRAPHY N/A 08/09/2017   Procedure: RIGHT/LEFT HEART CATH AND CORONARY ANGIOGRAPHY;  Surgeon: [name], MD;  Location: [place] [facility];  Service: Cardiovascular;  Laterality: N/A;  . TEE WITHOUT CARDIOVERSION N/A 07/04/2017   Procedure: TRANSESOPHAGEAL ECHOCARDIOGRAM (TEE);  Surgeon: [name], MD;  Location: [facility] ENDOSCOPY;  Service: Cardiovascular;  Laterality: N/A;  . TEE WITHOUT CARDIOVERSION N/A 03/09/2018   Procedure: TRANSESOPHAGEAL ECHOCARDIOGRAM (TEE);  Surgeon: [name], MD;  Location: [facility] ENDOSCOPY;  Service: Cardiovascular;  Laterality: N/A;    SOCIAL HISTORY: Social History   Tobacco Use  . Smoking status: Never Smoker  . Smokeless tobacco: Never Used  Substance Use Topics  . Alcohol use: Yes    Comment: little  . Drug use: No    FAMILY HISTORY: Family History  Problem Relation Age of Onset  . Heart disease Mother   . Hypertension Mother   . Hyperlipidemia Mother   . Diabetes Mother   . Sudden death Mother   . Thyroid disease Mother   . Obesity Mother   . Heart disease Father   . Heart attack Father   . Hypertension Father    . Hyperlipidemia Father   . Diabetes Father     ROS: Review of Systems  Constitutional: Positive for malaise/fatigue. Negative for weight loss.  Gastrointestinal: Negative for nausea and vomiting.  Musculoskeletal:       Negative muscle weakness  Endo/Heme/Allergies:       Negative hypoglycemia    PHYSICAL EXAM: Pt in no acute distress  RECENT LABS AND TESTS: BMET    Component Value Date/Time   NA 137 09/14/2018 1449   NA 141 05/17/2018 1154   K 4.0 09/14/2018 1449   CL 102 09/14/2018 1449   CO2 26 09/14/2018 1449   GLUCOSE 121 (H) 09/14/2018 1449   BUN 23 09/14/2018 1449   BUN 16 05/17/2018 1154   CREATININE 0.92 09/14/2018 1449   CALCIUM 9.2 09/14/2018 1449   GFRNONAA >60 09/14/2018 1449   GFRAA >60 09/14/2018 1449   Lab Results  Component Value Date   HGBA1C 5.8 (H) 09/21/2018   HGBA1C 6.0 (H) 05/17/2018   HGBA1C 5.8 (H) 11/02/2017   HGBA1C 5.8 (H) 06/26/2017   Lab Results  Component Value Date   INSULIN 24.4 09/21/2018   INSULIN 16.1 05/17/2018   INSULIN 8.5 11/02/2017   CBC    Component Value Date/Time   WBC 8.5 03/08/2018 1442   WBC 7.4 08/31/2017 0930   RBC 4.46 03/08/2018 1442   RBC 4.63 08/31/2017 0930   HGB 14.0 03/08/2018 1442   HCT 42.2 03/08/2018 1442   PLT 256 03/08/2018 1442   MCV 95 03/08/2018 1442   MCH 31.4 03/08/2018 1442   MCH 30.2 08/31/2017 0930   MCHC 33.2 03/08/2018 1442   MCHC 33.2 08/31/2017 0930   RDW 13.4 03/08/2018 1442   LYMPHSABS 2.5 03/08/2018 1442   MONOABS 1.2 (H) 02/23/2015 0619   EOSABS 0.1 03/08/2018 1442   BASOSABS 0.0 03/08/2018 1442   Iron/TIBC/Ferritin/ %Sat No results found for: IRON, TIBC, FERRITIN, IRONPCTSAT Lipid Panel     Component Value Date/Time   CHOL 170 09/21/2018 0951   TRIG 131 09/21/2018 0951   HDL 32 (L) 09/21/2018 0951   CHOLHDL 5.4 06/26/2017 0517   VLDL 15 06/26/2017 0517   LDLCALC 112 (H) 09/21/2018 0951   Hepatic Function Panel     Component Value Date/Time   PROT 7.5  05/17/2018 1154   ALBUMIN 4.2 05/17/2018 1154   AST 24 05/17/2018 1154   ALT 15 05/17/2018 1154   ALKPHOS 86 05/17/2018 1154   BILITOT 0.6 05/17/2018 1154      Component Value Date/Time   TSH 4.610 (H) 09/21/2018 0951   TSH 3.010 11/02/2017 1254   TSH 4.410 06/24/2017 1046  I, Trixie Dredge, am acting as transcriptionist for Ilene Qua, MD  I have reviewed the above documentation for accuracy and completeness, and I agree with the above. - Ilene Qua, MD

## 2018-09-29 ENCOUNTER — Other Ambulatory Visit: Payer: Self-pay | Admitting: Physician Assistant

## 2018-10-09 ENCOUNTER — Other Ambulatory Visit: Payer: Self-pay

## 2018-10-09 ENCOUNTER — Ambulatory Visit (INDEPENDENT_AMBULATORY_CARE_PROVIDER_SITE_OTHER): Payer: Medicare Other | Admitting: Family Medicine

## 2018-10-09 DIAGNOSIS — Z6838 Body mass index (BMI) 38.0-38.9, adult: Secondary | ICD-10-CM | POA: Diagnosis not present

## 2018-10-09 DIAGNOSIS — E559 Vitamin D deficiency, unspecified: Secondary | ICD-10-CM

## 2018-10-09 DIAGNOSIS — R7303 Prediabetes: Secondary | ICD-10-CM | POA: Diagnosis not present

## 2018-10-09 MED ORDER — VITAMIN D (ERGOCALCIFEROL) 1.25 MG (50000 UNIT) PO CAPS: 50000.0000 [IU] | ORAL_CAPSULE | ORAL | 0 refills | Status: DC

## 2018-10-10 NOTE — Progress Notes (Signed)
Office: [phone]  /  Fax: [phone] TeleHealth Visit:  Christie Atkinson has verbally consented to this TeleHealth visit today. The patient is located at home, the provider is located at the [employer] [facility] office. The participants in this visit include the listed provider and patient. Christie Atkinson was unable to use realtime audiovisual technology today and the telehealth visit was conducted via telephone.   HPI:   Chief Complaint: OBESITY Christie Atkinson is here to discuss her progress with her obesity treatment plan. She is on the Category 2 plan and is following her eating plan approximately 95 % of the time. She states she is walking and cleaning for exercise. Christie Atkinson says she has been following the plan, but could not specify what exactly she has eaten for the day. She says she has limited indulgent eating. She is doing a good amount of physical activity daily.  We were unable to weigh the patient today for this TeleHealth visit. She feels as if she has maintained her weight since her last visit. She has lost 16 lbs since starting treatment with [name].  Vitamin D Deficiency Christie Atkinson has a diagnosis of vitamin D deficiency. She is currently taking prescription Vit D. She notes fatigue and denies nausea, vomiting or muscle weakness.  Pre-Diabetes Christie Atkinson has a diagnosis of pre-diabetes based on her elevated Hgb A1c. Last Hgb A1c slightly decreased in mid April. She was informed this puts her at greater risk of developing diabetes. She is taking metformin currently and continues to work on diet and exercise to decrease risk of diabetes. She denies feelings of hypoglycemia.  ASSESSMENT AND PLAN:  Vitamin D deficiency - Plan: Vitamin D, Ergocalciferol, (DRISDOL) 1.25 MG (50000 UT) CAPS capsule  Prediabetes  Class 2 severe obesity with serious comorbidity and body mass index (BMI) of 38.0 to 38.9 in adult, unspecified obesity type ([place])  PLAN:  Vitamin D Deficiency Christie Atkinson was  informed that low vitamin D levels contributes to fatigue and are associated with obesity, breast, and colon cancer. Chasey agrees to continue taking prescription Vit D @50 ,000 IU every week #4 and we will refill for 1 month. She will follow up for routine testing of vitamin D, at least 2-3 times per year. She was informed of the risk of over-replacement of vitamin D and agrees to not increase her dose unless she discusses this with [name] first. Christie Atkinson agrees to follow up with our clinic in 2 weeks.  Pre-Diabetes Christie Atkinson will continue to work on weight loss, exercise, and decreasing simple carbohydrates in her diet to help decrease the risk of diabetes. We dicussed metformin including benefits and risks. She was informed that eating too many simple carbohydrates or too many calories at one sitting increases the likelihood of GI side effects. Sevana agrees to continue taking metformin, and we will repeat labs in 3 months. Christie Atkinson agrees to follow up with our clinic in 2 weeks as directed to monitor her progress.  Obesity Christie Atkinson is currently in the action stage of change. As such, her goal is to continue with weight loss efforts She has agreed to follow the Category 2 plan Christie Atkinson has been instructed to work up to a goal of 150 minutes of combined cardio and strengthening exercise per week for weight loss and overall health benefits. We discussed the following Behavioral Modification Strategies today: increasing lean protein intake, increasing vegetables, work on meal planning and easy cooking plans, avoiding temptations, and planning for success   Christie Atkinson has agreed to follow up with  our clinic in 2 weeks. She was informed of the importance of frequent follow up visits to maximize her success with intensive lifestyle modifications for her multiple health conditions.  ALLERGIES: Allergies  Allergen Reactions  . Hydrocodone-Acetaminophen Nausea And Vomiting  . Other Other (See Comments)    Metals  Polyester - including hospital gowns and sheets - allergic contact dermatitis  . Sulfur Hives    MEDICATIONS: Current Outpatient Medications on File Prior to Visit  Medication Sig Dispense Refill  . acetaminophen (TYLENOL) 325 MG tablet Take 325 mg by mouth every 6 (six) hours as needed for moderate pain or headache.    . diltiazem (CARDIZEM CD) 120 MG 24 hr capsule Take 1 capsule (120 mg total) by mouth 2 (two) times daily. 180 capsule 2  . dofetilide (TIKOSYN) 125 MCG capsule Take 1 capsule (125 mcg total) by mouth 2 (two) times daily. 180 capsule 3  . magnesium oxide (MAGNESIUM-OXIDE) 400 (241.3 Mg) MG tablet Take 1 tablet (400 mg total) by mouth daily. Please make overdue appt with Dr. Tamala Julian before anymore refills. 1st attempt 30 tablet 1  . metFORMIN (GLUCOPHAGE) 500 MG tablet Take 1 tablet (500 mg total) by mouth daily with breakfast. 30 tablet 0  . Multiple Vitamin (MULTIVITAMIN WITH MINERALS) TABS tablet Take 1 tablet by mouth daily.     . Multiple Vitamins-Minerals (WOMENS MULTIVITAMIN) TABS Take 1 tablet by mouth daily. 90 tablet 0  . potassium chloride SA (K-DUR,KLOR-CON) 20 MEQ tablet Take 1 tablet (20 mEq total) by mouth daily. Take an extra 1/2 tablet by mouth on days when you take the extra fluid pill (furosemide/Lasix).     No current facility-administered medications on file prior to visit.     PAST MEDICAL HISTORY: Past Medical History:  Diagnosis Date  . Asthma   . Atrial fibrillation ([place])   . Back pain   . Diastolic dysfunction   . Fibromyalgia   . Gestational diabetes   . Knee pain   . Osteoarthritis   . Persistent atrial fibrillation   . Sleep apnea   . Visit for monitoring Tikosyn therapy 02/07/2018    PAST SURGICAL HISTORY: Past Surgical History:  Procedure Laterality Date  . ABDOMINAL HYSTERECTOMY    . ABLATION OF DYSRHYTHMIC FOCUS  03/09/2018  . APPENDECTOMY    . ATRIAL FIBRILLATION ABLATION N/A 03/09/2018   Procedure: ATRIAL FIBRILLATION ABLATION;   Surgeon: [name], MD;  Location: [place] [facility];  Service: Cardiovascular;  Laterality: N/A;  . CARDIOVERSION N/A 07/04/2017   Procedure: CARDIOVERSION;  Surgeon: [name], MD;  Location: [facility] ENDOSCOPY;  Service: Cardiovascular;  Laterality: N/A;  . CARDIOVERSION N/A 09/06/2017   Procedure: CARDIOVERSION;  Surgeon: [name], MD;  Location: [facility] ENDOSCOPY;  Service: Cardiovascular;  Laterality: N/A;  . CHOLECYSTECTOMY N/A 02/24/2015   Procedure: LAPAROSCOPIC CHOLECYSTECTOMY;  Surgeon: [name], MD;  Location: [place];  Service: General;  Laterality: N/A;  . RIGHT/LEFT HEART CATH AND CORONARY ANGIOGRAPHY N/A 08/09/2017   Procedure: RIGHT/LEFT HEART CATH AND CORONARY ANGIOGRAPHY;  Surgeon: [name], MD;  Location: [place] [facility];  Service: Cardiovascular;  Laterality: N/A;  . TEE WITHOUT CARDIOVERSION N/A 07/04/2017   Procedure: TRANSESOPHAGEAL ECHOCARDIOGRAM (TEE);  Surgeon: [name], MD;  Location: [facility] ENDOSCOPY;  Service: Cardiovascular;  Laterality: N/A;  . TEE WITHOUT CARDIOVERSION N/A 03/09/2018   Procedure: TRANSESOPHAGEAL ECHOCARDIOGRAM (TEE);  Surgeon: [name], MD;  Location: [place];  Service: Cardiovascular;  Laterality: N/A;    SOCIAL  HISTORY: Social History   Tobacco Use  . Smoking status: Never Smoker  . Smokeless tobacco: Never Used  Substance Use Topics  . Alcohol use: Yes    Comment: little  . Drug use: No    FAMILY HISTORY: Family History  Problem Relation Age of Onset  . Heart disease Mother   . Hypertension Mother   . Hyperlipidemia Mother   . Diabetes Mother   . Sudden death Mother   . Thyroid disease Mother   . Obesity Mother   . Heart disease Father   . Heart attack Father   . Hypertension Father   . Hyperlipidemia Father   . Diabetes Father     ROS: Review of Systems  Constitutional: Positive for malaise/fatigue. Negative for weight loss.  Gastrointestinal: Negative for nausea and vomiting.   Musculoskeletal:       Negative muscle weakness  Endo/Heme/Allergies:       Negative hypoglycemia    PHYSICAL EXAM: Pt in no acute distress  RECENT LABS AND TESTS: BMET    Component Value Date/Time   NA 137 09/14/2018 1449   NA 141 05/17/2018 1154   K 4.0 09/14/2018 1449   CL 102 09/14/2018 1449   CO2 26 09/14/2018 1449   GLUCOSE 121 (H) 09/14/2018 1449   BUN 23 09/14/2018 1449   BUN 16 05/17/2018 1154   CREATININE 0.92 09/14/2018 1449   CALCIUM 9.2 09/14/2018 1449   GFRNONAA >60 09/14/2018 1449   GFRAA >60 09/14/2018 1449   Lab Results  Component Value Date   HGBA1C 5.8 (H) 09/21/2018   HGBA1C 6.0 (H) 05/17/2018   HGBA1C 5.8 (H) 11/02/2017   HGBA1C 5.8 (H) 06/26/2017   Lab Results  Component Value Date   INSULIN 24.4 09/21/2018   INSULIN 16.1 05/17/2018   INSULIN 8.5 11/02/2017   CBC    Component Value Date/Time   WBC 8.5 03/08/2018 1442   WBC 7.4 08/31/2017 0930   RBC 4.46 03/08/2018 1442   RBC 4.63 08/31/2017 0930   HGB 14.0 03/08/2018 1442   HCT 42.2 03/08/2018 1442   PLT 256 03/08/2018 1442   MCV 95 03/08/2018 1442   MCH 31.4 03/08/2018 1442   MCH 30.2 08/31/2017 0930   MCHC 33.2 03/08/2018 1442   MCHC 33.2 08/31/2017 0930   RDW 13.4 03/08/2018 1442   LYMPHSABS 2.5 03/08/2018 1442   MONOABS 1.2 (H) 02/23/2015 0619   EOSABS 0.1 03/08/2018 1442   BASOSABS 0.0 03/08/2018 1442   Iron/TIBC/Ferritin/ %Sat No results found for: IRON, TIBC, FERRITIN, IRONPCTSAT Lipid Panel     Component Value Date/Time   CHOL 170 09/21/2018 0951   TRIG 131 09/21/2018 0951   HDL 32 (L) 09/21/2018 0951   CHOLHDL 5.4 06/26/2017 0517   VLDL 15 06/26/2017 0517   LDLCALC 112 (H) 09/21/2018 0951   Hepatic Function Panel     Component Value Date/Time   PROT 7.5 05/17/2018 1154   ALBUMIN 4.2 05/17/2018 1154   AST 24 05/17/2018 1154   ALT 15 05/17/2018 1154   ALKPHOS 86 05/17/2018 1154   BILITOT 0.6 05/17/2018 1154      Component Value Date/Time   TSH 4.610 (H)  09/21/2018 0951   TSH 3.010 11/02/2017 1254   TSH 4.410 06/24/2017 1046      I, Trixie Dredge, am acting as transcriptionist for Ilene Qua, MD  I have reviewed the above documentation for accuracy and completeness, and I agree with the above. - Ilene Qua, MD

## 2018-10-14 ENCOUNTER — Other Ambulatory Visit (INDEPENDENT_AMBULATORY_CARE_PROVIDER_SITE_OTHER): Payer: Self-pay | Admitting: Family Medicine

## 2018-10-14 DIAGNOSIS — E559 Vitamin D deficiency, unspecified: Secondary | ICD-10-CM

## 2018-10-23 ENCOUNTER — Ambulatory Visit (INDEPENDENT_AMBULATORY_CARE_PROVIDER_SITE_OTHER): Payer: Medicare Other | Admitting: Family Medicine

## 2018-10-23 DIAGNOSIS — R7303 Prediabetes: Secondary | ICD-10-CM

## 2018-10-23 DIAGNOSIS — Z6838 Body mass index (BMI) 38.0-38.9, adult: Secondary | ICD-10-CM | POA: Diagnosis not present

## 2018-10-23 DIAGNOSIS — E7849 Other hyperlipidemia: Secondary | ICD-10-CM

## 2018-10-24 ENCOUNTER — Other Ambulatory Visit (HOSPITAL_COMMUNITY): Payer: Self-pay | Admitting: *Deleted

## 2018-10-24 MED ORDER — MAGNESIUM OXIDE 400 (241.3 MG) MG PO TABS: 1.0000 | ORAL_TABLET | Freq: Every day | ORAL | 6 refills | Status: DC

## 2018-10-24 MED ORDER — APIXABAN 5 MG PO TABS: 5.0000 mg | ORAL_TABLET | Freq: Two times a day (BID) | ORAL | 6 refills | Status: DC

## 2018-10-24 MED ORDER — DOFETILIDE 125 MCG PO CAPS: 125.0000 ug | ORAL_CAPSULE | Freq: Two times a day (BID) | ORAL | 3 refills | Status: DC

## 2018-10-24 NOTE — Progress Notes (Signed)
Office: [phone]  /  Fax: [phone] TeleHealth Visit:  Christie Atkinson has verbally consented to this TeleHealth visit today. The patient is located at home, the provider is located at the [employer] [facility] office. The participants in this visit include the listed provider and patient. The visit was conducted today via audio.  HPI:   Chief Complaint: OBESITY Christie Atkinson is here to discuss her progress with her obesity treatment plan. She is on the Category 2 plan and is following her eating plan approximately 90 % of the time. She states she is exercising 0 minutes 0 times per week. Kinza is getting in 3 meals day, which she reports she feels better throughout the day and has more energy during the day. She reports she is trying to be wise in her food choices, and is working toward eating completely on the plan.  We were unable to weigh the patient today for this TeleHealth visit. She feels as if she has lost weight since her last visit. She has lost 16 lbs since starting treatment with [name].  Hyperlipidemia Christie Atkinson has hyperlipidemia and has been trying to improve her cholesterol levels with intensive lifestyle modification including a low saturated fat diet, exercise and weight loss. Last LDL was of 112 and HDL of 32. She is not on statin and denies any chest pain, claudication or myalgias.  Pre-Diabetes Christie Atkinson has a diagnosis of pre-diabetes based on her elevated Hgb A1c of 5.8 and insulin of 24.4. She was informed this puts her at greater risk of developing diabetes. She denies GI side effects of metformin and continues to work on diet and exercise to decrease risk of diabetes. She denies hypoglycemia.  ASSESSMENT AND PLAN:  Other hyperlipidemia  Prediabetes  Class 2 severe obesity with serious comorbidity and body mass index (BMI) of 38.0 to 38.9 in adult, unspecified obesity type (Christie Atkinson)  PLAN:  Hyperlipidemia Christie Atkinson was informed of the American Heart Association  Guidelines emphasizing intensive lifestyle modifications as the first line treatment for hyperlipidemia. We discussed many lifestyle modifications today in depth, and Christie Atkinson will continue to work on decreasing saturated fats such as fatty red meat, butter and many fried foods. She will also increase vegetables and lean protein in her diet and continue to work on exercise and weight loss efforts. We will repeat labs at the end of July. Christie Atkinson agrees to follow up with our clinic in 2 weeks.  Pre-Diabetes Christie Atkinson will continue to work on weight loss, exercise, and decreasing simple carbohydrates in her diet to help decrease the risk of diabetes. We dicussed metformin including benefits and risks. She was informed that eating too many simple carbohydrates or too many calories at one sitting increases the likelihood of GI side effects. Christie Atkinson agrees to continue taking metformin, and we will repeat labs at the end July. Christie Atkinson agrees to follow up with our clinic in 2 weeks as directed to monitor her progress.  Obesity Christie Atkinson is currently in the action stage of change. As such, her goal is to continue with weight loss efforts She has agreed to follow the Category 3 plan Christie Atkinson has been instructed to work up to a goal of 150 minutes of combined cardio and strengthening exercise per week for weight loss and overall health benefits. We discussed the following Behavioral Modification Strategies today: increasing lean protein intake, increasing vegetables and work on meal planning and easy cooking plans, keeping healthy foods in the home, and planning for success   Christie Atkinson has agreed  to follow up with our clinic in 2 weeks. She was informed of the importance of frequent follow up visits to maximize her success with intensive lifestyle modifications for her multiple health conditions.  ALLERGIES: Allergies  Allergen Reactions  . Hydrocodone-Acetaminophen Nausea And Vomiting  . Other Other (See Comments)     Metals Polyester - including hospital gowns and sheets - allergic contact dermatitis  . Sulfur Hives    MEDICATIONS: Current Outpatient Medications on File Prior to Visit  Medication Sig Dispense Refill  . acetaminophen (TYLENOL) 325 MG tablet Take 325 mg by mouth every 6 (six) hours as needed for moderate pain or headache.    . diltiazem (CARDIZEM CD) 120 MG 24 hr capsule Take 1 capsule (120 mg total) by mouth 2 (two) times daily. 180 capsule 2  . dofetilide (TIKOSYN) 125 MCG capsule Take 1 capsule (125 mcg total) by mouth 2 (two) times daily. 180 capsule 3  . magnesium oxide (MAGNESIUM-OXIDE) 400 (241.3 Mg) MG tablet Take 1 tablet (400 mg total) by mouth daily. Please make overdue appt with Dr. Tamala Julian before anymore refills. 1st attempt 30 tablet 1  . metFORMIN (GLUCOPHAGE) 500 MG tablet Take 1 tablet (500 mg total) by mouth daily with breakfast. 30 tablet 0  . Multiple Vitamin (MULTIVITAMIN WITH MINERALS) TABS tablet Take 1 tablet by mouth daily.     . Multiple Vitamins-Minerals (WOMENS MULTIVITAMIN) TABS Take 1 tablet by mouth daily. 90 tablet 0  . potassium chloride SA (K-DUR,KLOR-CON) 20 MEQ tablet Take 1 tablet (20 mEq total) by mouth daily. Take an extra 1/2 tablet by mouth on days when you take the extra fluid pill (furosemide/Lasix).    . Vitamin D, Ergocalciferol, (DRISDOL) 1.25 MG (50000 UT) CAPS capsule Take 1 capsule (50,000 Units total) by mouth every 7 (seven) days. TAKE 1 CAPSULE BY MOUTH EVERY 7 DAYS 12 capsule 0   No current facility-administered medications on file prior to visit.     PAST MEDICAL HISTORY: Past Medical History:  Diagnosis Date  . Asthma   . Atrial fibrillation ([place])   . Back pain   . Diastolic dysfunction   . Fibromyalgia   . Gestational diabetes   . Knee pain   . Osteoarthritis   . Persistent atrial fibrillation   . Sleep apnea   . Visit for monitoring Tikosyn therapy 02/07/2018    PAST SURGICAL HISTORY: Past Surgical History:  Procedure  Laterality Date  . ABDOMINAL HYSTERECTOMY    . ABLATION OF DYSRHYTHMIC FOCUS  03/09/2018  . APPENDECTOMY    . ATRIAL FIBRILLATION ABLATION N/A 03/09/2018   Procedure: ATRIAL FIBRILLATION ABLATION;  Surgeon: [name], MD;  Location: [place] [facility];  Service: Cardiovascular;  Laterality: N/A;  . CARDIOVERSION N/A 07/04/2017   Procedure: CARDIOVERSION;  Surgeon: [name], MD;  Location: [facility] ENDOSCOPY;  Service: Cardiovascular;  Laterality: N/A;  . CARDIOVERSION N/A 09/06/2017   Procedure: CARDIOVERSION;  Surgeon: [name], MD;  Location: [facility] ENDOSCOPY;  Service: Cardiovascular;  Laterality: N/A;  . CHOLECYSTECTOMY N/A 02/24/2015   Procedure: LAPAROSCOPIC CHOLECYSTECTOMY;  Surgeon: [name], MD;  Location: [place];  Service: General;  Laterality: N/A;  . RIGHT/LEFT HEART CATH AND CORONARY ANGIOGRAPHY N/A 08/09/2017   Procedure: RIGHT/LEFT HEART CATH AND CORONARY ANGIOGRAPHY;  Surgeon: [name], MD;  Location: [place] [facility];  Service: Cardiovascular;  Laterality: N/A;  . TEE WITHOUT CARDIOVERSION N/A 07/04/2017   Procedure: TRANSESOPHAGEAL ECHOCARDIOGRAM (TEE);  Surgeon: [name], MD;  Location: [facility] ENDOSCOPY;  Service: Cardiovascular;  Laterality: N/A;  . TEE WITHOUT CARDIOVERSION N/A 03/09/2018   Procedure: TRANSESOPHAGEAL ECHOCARDIOGRAM (TEE);  Surgeon: [name], MD;  Location: [facility] ENDOSCOPY;  Service: Cardiovascular;  Laterality: N/A;    SOCIAL HISTORY: Social History   Tobacco Use  . Smoking status: Never Smoker  . Smokeless tobacco: Never Used  Substance Use Topics  . Alcohol use: Yes    Comment: little  . Drug use: No    FAMILY HISTORY: Family History  Problem Relation Age of Onset  . Heart disease Mother   . Hypertension Mother   . Hyperlipidemia Mother   . Diabetes Mother   . Sudden death Mother   . Thyroid disease Mother   . Obesity Mother   . Heart disease Father   . Heart attack Father   . Hypertension Father   . Hyperlipidemia  Father   . Diabetes Father     ROS: Review of Systems  Constitutional: Positive for weight loss.  Cardiovascular: Negative for chest pain and claudication.  Musculoskeletal: Negative for myalgias.  Endo/Heme/Allergies:       Negative hypoglycemia    PHYSICAL EXAM: Pt in no acute distress  RECENT LABS AND TESTS: BMET    Component Value Date/Time   NA 137 09/14/2018 1449   NA 141 05/17/2018 1154   K 4.0 09/14/2018 1449   CL 102 09/14/2018 1449   CO2 26 09/14/2018 1449   GLUCOSE 121 (H) 09/14/2018 1449   BUN 23 09/14/2018 1449   BUN 16 05/17/2018 1154   CREATININE 0.92 09/14/2018 1449   CALCIUM 9.2 09/14/2018 1449   GFRNONAA >60 09/14/2018 1449   GFRAA >60 09/14/2018 1449   Lab Results  Component Value Date   HGBA1C 5.8 (H) 09/21/2018   HGBA1C 6.0 (H) 05/17/2018   HGBA1C 5.8 (H) 11/02/2017   HGBA1C 5.8 (H) 06/26/2017   Lab Results  Component Value Date   INSULIN 24.4 09/21/2018   INSULIN 16.1 05/17/2018   INSULIN 8.5 11/02/2017   CBC    Component Value Date/Time   WBC 8.5 03/08/2018 1442   WBC 7.4 08/31/2017 0930   RBC 4.46 03/08/2018 1442   RBC 4.63 08/31/2017 0930   HGB 14.0 03/08/2018 1442   HCT 42.2 03/08/2018 1442   PLT 256 03/08/2018 1442   MCV 95 03/08/2018 1442   MCH 31.4 03/08/2018 1442   MCH 30.2 08/31/2017 0930   MCHC 33.2 03/08/2018 1442   MCHC 33.2 08/31/2017 0930   RDW 13.4 03/08/2018 1442   LYMPHSABS 2.5 03/08/2018 1442   MONOABS 1.2 (H) 02/23/2015 0619   EOSABS 0.1 03/08/2018 1442   BASOSABS 0.0 03/08/2018 1442   Iron/TIBC/Ferritin/ %Sat No results found for: IRON, TIBC, FERRITIN, IRONPCTSAT Lipid Panel     Component Value Date/Time   CHOL 170 09/21/2018 0951   TRIG 131 09/21/2018 0951   HDL 32 (L) 09/21/2018 0951   CHOLHDL 5.4 06/26/2017 0517   VLDL 15 06/26/2017 0517   LDLCALC 112 (H) 09/21/2018 0951   Hepatic Function Panel     Component Value Date/Time   PROT 7.5 05/17/2018 1154   ALBUMIN 4.2 05/17/2018 1154   AST 24  05/17/2018 1154   ALT 15 05/17/2018 1154   ALKPHOS 86 05/17/2018 1154   BILITOT 0.6 05/17/2018 1154      Component Value Date/Time   TSH 4.610 (H) 09/21/2018 0951   TSH 3.010 11/02/2017 1254   TSH 4.410 06/24/2017 1046      I, Trixie Dredge, am acting as Location manager  for Ilene Qua, MD  I have reviewed the above documentation for accuracy and completeness, and I agree with the above. - Ilene Qua, MD

## 2018-10-26 ENCOUNTER — Other Ambulatory Visit (HOSPITAL_COMMUNITY): Payer: Self-pay | Admitting: *Deleted

## 2018-10-26 DIAGNOSIS — I4819 Other persistent atrial fibrillation: Secondary | ICD-10-CM

## 2018-11-01 ENCOUNTER — Other Ambulatory Visit (HOSPITAL_COMMUNITY): Payer: Medicare Other

## 2018-11-06 ENCOUNTER — Ambulatory Visit (INDEPENDENT_AMBULATORY_CARE_PROVIDER_SITE_OTHER): Payer: Medicare Other | Admitting: Family Medicine

## 2018-11-06 ENCOUNTER — Encounter (INDEPENDENT_AMBULATORY_CARE_PROVIDER_SITE_OTHER): Payer: Self-pay

## 2018-11-10 DIAGNOSIS — M25572 Pain in left ankle and joints of left foot: Secondary | ICD-10-CM | POA: Diagnosis not present

## 2018-11-10 DIAGNOSIS — M722 Plantar fascial fibromatosis: Secondary | ICD-10-CM | POA: Insufficient documentation

## 2018-11-10 DIAGNOSIS — M67872 Other specified disorders of synovium, left ankle and foot: Secondary | ICD-10-CM | POA: Diagnosis not present

## 2018-11-10 DIAGNOSIS — M7660 Achilles tendinitis, unspecified leg: Secondary | ICD-10-CM

## 2018-11-10 DIAGNOSIS — M6701 Short Achilles tendon (acquired), right ankle: Secondary | ICD-10-CM | POA: Diagnosis not present

## 2018-11-10 DIAGNOSIS — M6702 Short Achilles tendon (acquired), left ankle: Secondary | ICD-10-CM | POA: Diagnosis not present

## 2018-11-10 HISTORY — DX: Achilles tendinitis, unspecified leg: M76.60

## 2018-11-10 HISTORY — DX: Plantar fascial fibromatosis: M72.2

## 2018-11-13 DIAGNOSIS — M6702 Short Achilles tendon (acquired), left ankle: Secondary | ICD-10-CM | POA: Diagnosis not present

## 2018-11-15 ENCOUNTER — Encounter (INDEPENDENT_AMBULATORY_CARE_PROVIDER_SITE_OTHER): Payer: Self-pay

## 2018-11-15 DIAGNOSIS — M6702 Short Achilles tendon (acquired), left ankle: Secondary | ICD-10-CM | POA: Diagnosis not present

## 2018-11-15 DIAGNOSIS — M898X7 Other specified disorders of bone, ankle and foot: Secondary | ICD-10-CM | POA: Diagnosis not present

## 2018-11-15 DIAGNOSIS — M67879 Other specified disorders of synovium and tendon, unspecified ankle and foot: Secondary | ICD-10-CM | POA: Diagnosis not present

## 2018-11-16 ENCOUNTER — Telehealth: Payer: Self-pay | Admitting: *Deleted

## 2018-11-16 NOTE — Telephone Encounter (Signed)
Pt takes Eliquis for afib with CHADS2VASc score of 3 (sex, CHF, prediabetic on metformin). Renal function is normal. Ok to hold Eliquis for 2 days prior to procedure.

## 2018-11-16 NOTE — Telephone Encounter (Signed)
   [facility] Pre-operative Risk Assessment    Request for surgical clearance:  1. What type of surgery is being performed? ACHILLES TENDON RECONSTRUCTION  2. When is this surgery scheduled? TBD  3. What type of clearance is required (medical clearance vs. Pharmacy clearance to hold med vs. Both)? BOTH  4. Are there any medications that need to be held prior to surgery and how long? ELIQUIS  5. Practice name and name of physician performing surgery? [facility]; DR. Jenny Reichmann [place]  6. What is your office phone number [phone]   7.   What is your office fax number [phone]  8.   Anesthesia type (None, local, MAC, general) ? CHOICE? GENERAL? NOT LISTED   Christie Atkinson 11/16/2018, 4:04 PM  _________________________________________________________________   (provider comments below)

## 2018-11-17 NOTE — Telephone Encounter (Signed)
   Primary Cardiologist: Sinclair Grooms, MD  Chart reviewed as part of pre-operative protocol coverage. Patient was contacted 11/17/2018 in reference to pre-operative risk assessment for pending surgery as outlined below.  Christie Atkinson was last seen on 09/14/18 by afib clinic doing well. H/o persistent AF on tikosyn, asthma, fibromyalgia, OSA, normal coronaries by cath 08/2017, LV dysfunction EF 40-45% with f/u echo planned. RCRI 0.9% indicating low risk of CV complications. Called pt to see how she is feeling - if doing well, should be OK to clear. LMOM to call us back.  Charlie Pitter, PA-C 11/17/2018, 11:09 AM

## 2018-11-20 ENCOUNTER — Other Ambulatory Visit: Payer: Self-pay

## 2018-11-20 ENCOUNTER — Ambulatory Visit (INDEPENDENT_AMBULATORY_CARE_PROVIDER_SITE_OTHER): Payer: Medicare Other | Admitting: Family Medicine

## 2018-11-20 ENCOUNTER — Encounter (INDEPENDENT_AMBULATORY_CARE_PROVIDER_SITE_OTHER): Payer: Self-pay | Admitting: Family Medicine

## 2018-11-20 DIAGNOSIS — R7303 Prediabetes: Secondary | ICD-10-CM

## 2018-11-20 DIAGNOSIS — E559 Vitamin D deficiency, unspecified: Secondary | ICD-10-CM

## 2018-11-20 DIAGNOSIS — Z6838 Body mass index (BMI) 38.0-38.9, adult: Secondary | ICD-10-CM | POA: Diagnosis not present

## 2018-11-20 MED ORDER — VITAMIN D (ERGOCALCIFEROL) 1.25 MG (50000 UNIT) PO CAPS: 50000.0000 [IU] | ORAL_CAPSULE | ORAL | 0 refills | Status: DC

## 2018-11-20 NOTE — Progress Notes (Signed)
Office: [phone]  /  Fax: [phone] TeleHealth Visit:  [name] has verbally consented to this TeleHealth visit today. The patient is located at home, the provider is located at the [employer] [facility] office. The participants in this visit include the listed provider and patient. Shanaya was unable to use realtime audiovisual technology today and the telehealth visit was conducted via telephone. [name]  HPI:   Chief Complaint: OBESITY Renny is here to discuss her progress with her obesity treatment plan. She is on the  follow the Category 3 plan and is following her eating plan approximately 90 % of the time. She states she is exercising 0 minutes 0 times per week. Addalie has been having significant foot and heel pain secondary to heel spur and plantar fascitis. Her weight yesterday was 240 lbs. She denies being hungry. She really cant do much exercise.  We were unable to weigh the patient today for this TeleHealth visit. She feels as if she has maintained weight since her last visit. She has lost 16 lbs since starting treatment with [name].  Vitamin D deficiency Karmin has a diagnosis of vitamin D deficiency. She is currently taking vit D and denies nausea, vomiting or muscle weakness. She reports fatigue.   Pre-Diabetes Bryli has a diagnosis of prediabetes based on her elevated HgA1c of 5.8 on 09/21/18 and was informed this puts her at greater risk of developing diabetes. She is taking metformin currently and continues to work on diet and exercise to decrease risk of diabetes. She denies nausea or hypoglycemia.   ASSESSMENT AND PLAN:  Vitamin D deficiency - Plan: Vitamin D, Ergocalciferol, (DRISDOL) 1.25 MG (50000 UT) CAPS capsule  Prediabetes  Class 2 severe obesity with serious comorbidity and body mass index (BMI) of 38.0 to 38.9 in adult, unspecified obesity type ([place])  PLAN: Vitamin D Deficiency Azoria was informed that low vitamin D levels contributes to  fatigue and are associated with obesity, breast, and colon cancer. She agrees to continue to take prescription Vit D @50 ,000 IU every week #4 with no refills and will follow up for routine testing of vitamin D, at least 2-3 times per year. She was informed of the risk of over-replacement of vitamin D and agrees to not increase her dose unless she discusses this with [name] first. Agrees to follow up with our clinic as directed.    Pre-Diabetes Renezmae will continue to work on weight loss, exercise, and decreasing simple carbohydrates in her diet to help decrease the risk of diabetes. We dicussed metformin including benefits and risks. She was informed that eating too many simple carbohydrates or too many calories at one sitting increases the likelihood of GI side effects. Breezy agrees to continue Metformin as prescribed. Wyvonne agreed to follow up with [name] as directed to monitor her progress. We will repeat labs mid-late July.  Obesity Kearie is currently in the action stage of change. As such, her goal is to continue with weight loss efforts She has agreed to follow the Category 3 plan Zitlaly has been instructed to work up to a goal of 150 minutes of combined cardio and strengthening exercise per week for weight loss and overall health benefits. We discussed the following Behavioral Modification Stratagies today: keeping healthy foods in the home, better snacking choices, planning for success, increasing lean protein intake, increasing vegetables and work on meal planning and easy cooking plans   Encarnacion has agreed to follow up with our clinic in 2 weeks. She was  informed of the importance of frequent follow up visits to maximize her success with intensive lifestyle modifications for her multiple health conditions.  ALLERGIES: Allergies  Allergen Reactions  . Hydrocodone-Acetaminophen Nausea And Vomiting  . Other Other (See Comments)    Metals Polyester - including hospital gowns and sheets -  allergic contact dermatitis  . Sulfur Hives    MEDICATIONS: Current Outpatient Medications on File Prior to Visit  Medication Sig Dispense Refill  . acetaminophen (TYLENOL) 325 MG tablet Take 325 mg by mouth every 6 (six) hours as needed for moderate pain or headache.    [name] apixaban (ELIQUIS) 5 MG TABS tablet Take 1 tablet (5 mg total) by mouth 2 (two) times daily. 60 tablet 6  . diltiazem (CARDIZEM CD) 120 MG 24 hr capsule Take 1 capsule (120 mg total) by mouth 2 (two) times daily. 180 capsule 2  . dofetilide (TIKOSYN) 125 MCG capsule Take 1 capsule (125 mcg total) by mouth 2 (two) times daily. 180 capsule 3  . magnesium oxide (MAGNESIUM-OXIDE) 400 (241.3 Mg) MG tablet Take 1 tablet (400 mg total) by mouth daily. 30 tablet 6  . metFORMIN (GLUCOPHAGE) 500 MG tablet Take 1 tablet (500 mg total) by mouth daily with breakfast. 30 tablet 0  . Multiple Vitamin (MULTIVITAMIN WITH MINERALS) TABS tablet Take 1 tablet by mouth daily.     . Multiple Vitamins-Minerals (WOMENS MULTIVITAMIN) TABS Take 1 tablet by mouth daily. 90 tablet 0  . potassium chloride SA (K-DUR,KLOR-CON) 20 MEQ tablet Take 1 tablet (20 mEq total) by mouth daily. Take an extra 1/2 tablet by mouth on days when you take the extra fluid pill (furosemide/Lasix).     No current facility-administered medications on file prior to visit.     PAST MEDICAL HISTORY: Past Medical History:  Diagnosis Date  . Asthma   . Atrial fibrillation ([place])   . Back pain   . Diastolic dysfunction   . Fibromyalgia   . Gestational diabetes   . Knee pain   . Osteoarthritis   . Persistent atrial fibrillation   . Sleep apnea   . Visit for monitoring Tikosyn therapy 02/07/2018    PAST SURGICAL HISTORY: Past Surgical History:  Procedure Laterality Date  . ABDOMINAL HYSTERECTOMY    . ABLATION OF DYSRHYTHMIC FOCUS  03/09/2018  . APPENDECTOMY    . ATRIAL FIBRILLATION ABLATION N/A 03/09/2018   Procedure: ATRIAL FIBRILLATION ABLATION;  Surgeon: [name], MD;  Location: [place] [facility];  Service: Cardiovascular;  Laterality: N/A;  . CARDIOVERSION N/A 07/04/2017   Procedure: CARDIOVERSION;  Surgeon: [name], MD;  Location: [facility] ENDOSCOPY;  Service: Cardiovascular;  Laterality: N/A;  . CARDIOVERSION N/A 09/06/2017   Procedure: CARDIOVERSION;  Surgeon: [name], MD;  Location: [facility] ENDOSCOPY;  Service: Cardiovascular;  Laterality: N/A;  . CHOLECYSTECTOMY N/A 02/24/2015   Procedure: LAPAROSCOPIC CHOLECYSTECTOMY;  Surgeon: [name], MD;  Location: [place];  Service: General;  Laterality: N/A;  . RIGHT/LEFT HEART CATH AND CORONARY ANGIOGRAPHY N/A 08/09/2017   Procedure: RIGHT/LEFT HEART CATH AND CORONARY ANGIOGRAPHY;  Surgeon: [name], MD;  Location: [place] [facility];  Service: Cardiovascular;  Laterality: N/A;  . TEE WITHOUT CARDIOVERSION N/A 07/04/2017   Procedure: TRANSESOPHAGEAL ECHOCARDIOGRAM (TEE);  Surgeon: [name], MD;  Location: [facility] ENDOSCOPY;  Service: Cardiovascular;  Laterality: N/A;  . TEE WITHOUT CARDIOVERSION N/A 03/09/2018   Procedure: TRANSESOPHAGEAL ECHOCARDIOGRAM (TEE);  Surgeon: [name], MD;  Location: [place];  Service: Cardiovascular;  Laterality: N/A;  SOCIAL HISTORY: Social History   Tobacco Use  . Smoking status: Never Smoker  . Smokeless tobacco: Never Used  Substance Use Topics  . Alcohol use: Yes    Comment: little  . Drug use: No    FAMILY HISTORY: Family History  Problem Relation Age of Onset  . Heart disease Mother   . Hypertension Mother   . Hyperlipidemia Mother   . Diabetes Mother   . Sudden death Mother   . Thyroid disease Mother   . Obesity Mother   . Heart disease Father   . Heart attack Father   . Hypertension Father   . Hyperlipidemia Father   . Diabetes Father     ROS: Review of Systems  Constitutional: Positive for malaise/fatigue.  Gastrointestinal: Negative for nausea and vomiting.  Musculoskeletal:       Negative for muscle weakness   Endo/Heme/Allergies:       Negative for hypoglycemia    PHYSICAL EXAM: Pt in no acute distress  RECENT LABS AND TESTS: BMET    Component Value Date/Time   NA 137 09/14/2018 1449   NA 141 05/17/2018 1154   K 4.0 09/14/2018 1449   CL 102 09/14/2018 1449   CO2 26 09/14/2018 1449   GLUCOSE 121 (H) 09/14/2018 1449   BUN 23 09/14/2018 1449   BUN 16 05/17/2018 1154   CREATININE 0.92 09/14/2018 1449   CALCIUM 9.2 09/14/2018 1449   GFRNONAA >60 09/14/2018 1449   GFRAA >60 09/14/2018 1449   Lab Results  Component Value Date   HGBA1C 5.8 (H) 09/21/2018   HGBA1C 6.0 (H) 05/17/2018   HGBA1C 5.8 (H) 11/02/2017   HGBA1C 5.8 (H) 06/26/2017   Lab Results  Component Value Date   INSULIN 24.4 09/21/2018   INSULIN 16.1 05/17/2018   INSULIN 8.5 11/02/2017   CBC    Component Value Date/Time   WBC 8.5 03/08/2018 1442   WBC 7.4 08/31/2017 0930   RBC 4.46 03/08/2018 1442   RBC 4.63 08/31/2017 0930   HGB 14.0 03/08/2018 1442   HCT 42.2 03/08/2018 1442   PLT 256 03/08/2018 1442   MCV 95 03/08/2018 1442   MCH 31.4 03/08/2018 1442   MCH 30.2 08/31/2017 0930   MCHC 33.2 03/08/2018 1442   MCHC 33.2 08/31/2017 0930   RDW 13.4 03/08/2018 1442   LYMPHSABS 2.5 03/08/2018 1442   MONOABS 1.2 (H) 02/23/2015 0619   EOSABS 0.1 03/08/2018 1442   BASOSABS 0.0 03/08/2018 1442   Iron/TIBC/Ferritin/ %Sat No results found for: IRON, TIBC, FERRITIN, IRONPCTSAT Lipid Panel     Component Value Date/Time   CHOL 170 09/21/2018 0951   TRIG 131 09/21/2018 0951   HDL 32 (L) 09/21/2018 0951   CHOLHDL 5.4 06/26/2017 0517   VLDL 15 06/26/2017 0517   LDLCALC 112 (H) 09/21/2018 0951   Hepatic Function Panel     Component Value Date/Time   PROT 7.5 05/17/2018 1154   ALBUMIN 4.2 05/17/2018 1154   AST 24 05/17/2018 1154   ALT 15 05/17/2018 1154   ALKPHOS 86 05/17/2018 1154   BILITOT 0.6 05/17/2018 1154      Component Value Date/Time   TSH 4.610 (H) 09/21/2018 0951   TSH 3.010 11/02/2017 1254    TSH 4.410 06/24/2017 1046   Results for DEIRA, SHIMER (MRN [id number]) as of 11/20/2018 20:57  Ref. Range 05/17/2018 11:54  Vitamin D, 25-Hydroxy Latest Ref Range: 30.0 - 100.0 ng/mL 51.1     I, Renee Ramus, am acting as Location manager for [employer]  Adair Patter, MD   I have reviewed the above documentation for accuracy and completeness, and I agree with the above. - Ilene Qua, MD

## 2018-11-20 NOTE — Telephone Encounter (Signed)
Follow up  ° ° °Patient is returning call.  °

## 2018-11-20 NOTE — Telephone Encounter (Signed)
   Primary Cardiologist: Sinclair Grooms, MD  Chart reviewed as part of pre-operative protocol coverage. Given past medical history and time since last visit, based on ACC/AHA guidelines, Viera A Kercheval would be at acceptable risk for the planned procedure without further cardiovascular testing.   I will route this recommendation to the requesting party via Epic fax function and remove from pre-op pool.  Please call with questions.  [place], [place] 11/20/2018, 1:24 PM

## 2018-11-29 ENCOUNTER — Other Ambulatory Visit (HOSPITAL_COMMUNITY): Payer: Self-pay | Admitting: Orthopedic Surgery

## 2018-12-04 ENCOUNTER — Other Ambulatory Visit (HOSPITAL_COMMUNITY): Payer: Medicare Other

## 2018-12-05 ENCOUNTER — Encounter (INDEPENDENT_AMBULATORY_CARE_PROVIDER_SITE_OTHER): Payer: Self-pay | Admitting: Family Medicine

## 2018-12-05 ENCOUNTER — Other Ambulatory Visit: Payer: Self-pay

## 2018-12-05 ENCOUNTER — Ambulatory Visit (INDEPENDENT_AMBULATORY_CARE_PROVIDER_SITE_OTHER): Payer: Medicare Other | Admitting: Family Medicine

## 2018-12-05 VITALS — BP 144/73 | HR 73 | Temp 98.1°F | Ht 65.0 in | Wt 234.0 lb

## 2018-12-05 DIAGNOSIS — Z6839 Body mass index (BMI) 39.0-39.9, adult: Secondary | ICD-10-CM

## 2018-12-05 DIAGNOSIS — M722 Plantar fascial fibromatosis: Secondary | ICD-10-CM | POA: Diagnosis not present

## 2018-12-05 MED ORDER — MAGNESIUM GLUCONATE 30 MG PO TABS: 30.0000 mg | ORAL_TABLET | Freq: Every day | ORAL | 0 refills | Status: DC

## 2018-12-06 NOTE — Progress Notes (Signed)
Office: [phone]  /  Fax: [phone]   HPI:   Chief Complaint: OBESITY Christie Atkinson is here to discuss her progress with her obesity treatment plan. She is on the Category 3 plan and is following her eating plan approximately 90 % of the time. She states she is working around [employer], stairs, and walking for 60 minutes 7 times per week. Christie Atkinson has been very busy, eating more than ever by trying to follow the plan. She is pre-planning meals for post operation in mid July. She finds herself feeling better with eating more on the plan.  Her weight is 234 lb (106.1 kg) today and has gained 1 lb since her last visit. She has lost 15 lbs since starting treatment with [name].  Plantar Fasciitis Christie Atkinson has surgery planned for July 16th. She notes she is struggling with the plan.  Hypomagnesemia Christie Atkinson is on Magoxide, but she states she ran out. She is looking for alternatives.  ASSESSMENT AND PLAN:  Plantar fasciitis  Hypomagnesemia  Class 2 severe obesity with serious comorbidity and body mass index (BMI) of 39.0 to 39.9 in adult, unspecified obesity type ([place])  PLAN:  Plantar Fasciitis We will follow up post operation, and [name] agrees to follow up with our clinic in 2 weeks prior to July 16th.  Hypomagnesemia Christie Atkinson agrees to start OTC magnesium gluconate 30 mg 1 tablet PO daily #30 with no refills. Christie Atkinson agrees to follow up with our clinic in 2 weeks prior to July 16th.  Obesity Christie Atkinson is currently in the action stage of change. As such, her goal is to continue with weight loss efforts She has agreed to keep a food journal with 450-600 calories and 40+ grams of protein at supper daily and follow the Category 3 plan Christie Atkinson has been instructed to work up to a goal of 150 minutes of combined cardio and strengthening exercise per week for weight loss and overall health benefits. We discussed the following Behavioral Modification Strategies today: increasing lean protein  intake, increasing vegetables and work on meal planning and easy cooking plans, keeping healthy foods in the home, and planning for success   Christie Atkinson has agreed to follow up with our clinic in 2 weeks prior to July 16th. She was informed of the importance of frequent follow up visits to maximize her success with intensive lifestyle modifications for her multiple health conditions.  ALLERGIES: Allergies  Allergen Reactions  . Hydrocodone-Acetaminophen Nausea And Vomiting  . Other Other (See Comments)    Metals Polyester - including hospital gowns and sheets - allergic contact dermatitis  . Sulfur Hives    MEDICATIONS: Current Outpatient Medications on File Prior to Visit  Medication Sig Dispense Refill  . acetaminophen (TYLENOL) 325 MG tablet Take 325 mg by mouth every 6 (six) hours as needed for moderate pain or headache.    [name] apixaban (ELIQUIS) 5 MG TABS tablet Take 1 tablet (5 mg total) by mouth 2 (two) times daily. 60 tablet 6  . diltiazem (CARDIZEM CD) 120 MG 24 hr capsule Take 1 capsule (120 mg total) by mouth 2 (two) times daily. 180 capsule 2  . dofetilide (TIKOSYN) 125 MCG capsule Take 1 capsule (125 mcg total) by mouth 2 (two) times daily. 180 capsule 3  . furosemide (LASIX) 40 MG tablet Take 40 mg by mouth.    . magnesium oxide (MAGNESIUM-OXIDE) 400 (241.3 Mg) MG tablet Take 1 tablet (400 mg total) by mouth daily. 30 tablet 6  . metFORMIN (GLUCOPHAGE) 500 MG tablet  Take 1 tablet (500 mg total) by mouth daily with breakfast. 30 tablet 0  . Multiple Vitamin (MULTIVITAMIN WITH MINERALS) TABS tablet Take 1 tablet by mouth daily.     . Multiple Vitamins-Minerals (WOMENS MULTIVITAMIN) TABS Take 1 tablet by mouth daily. 90 tablet 0  . potassium chloride SA (K-DUR,KLOR-CON) 20 MEQ tablet Take 1 tablet (20 mEq total) by mouth daily. Take an extra 1/2 tablet by mouth on days when you take the extra fluid pill (furosemide/Lasix).    . Vitamin D, Ergocalciferol, (DRISDOL) 1.25 MG (50000 UT)  CAPS capsule Take 1 capsule (50,000 Units total) by mouth every 7 (seven) days. TAKE 1 CAPSULE BY MOUTH EVERY 7 DAYS 12 capsule 0   No current facility-administered medications on file prior to visit.     PAST MEDICAL HISTORY: Past Medical History:  Diagnosis Date  . Asthma   . Atrial fibrillation ([place])   . Back pain   . Diastolic dysfunction   . Fibromyalgia   . Gestational diabetes   . Knee pain   . Osteoarthritis   . Persistent atrial fibrillation   . Sleep apnea   . Visit for monitoring Tikosyn therapy 02/07/2018    PAST SURGICAL HISTORY: Past Surgical History:  Procedure Laterality Date  . ABDOMINAL HYSTERECTOMY    . ABLATION OF DYSRHYTHMIC FOCUS  03/09/2018  . APPENDECTOMY    . ATRIAL FIBRILLATION ABLATION N/A 03/09/2018   Procedure: ATRIAL FIBRILLATION ABLATION;  Surgeon: [name], MD;  Location: [place] [facility] LAB;  Service: Cardiovascular;  Laterality: N/A;  . CARDIOVERSION N/A 07/04/2017   Procedure: CARDIOVERSION;  Surgeon: [name], MD;  Location: [facility] ENDOSCOPY;  Service: Cardiovascular;  Laterality: N/A;  . CARDIOVERSION N/A 09/06/2017   Procedure: CARDIOVERSION;  Surgeon: [name], MD;  Location: [facility] ENDOSCOPY;  Service: Cardiovascular;  Laterality: N/A;  . CHOLECYSTECTOMY N/A 02/24/2015   Procedure: LAPAROSCOPIC CHOLECYSTECTOMY;  Surgeon: [name], MD;  Location: [place];  Service: General;  Laterality: N/A;  . RIGHT/LEFT HEART CATH AND CORONARY ANGIOGRAPHY N/A 08/09/2017   Procedure: RIGHT/LEFT HEART CATH AND CORONARY ANGIOGRAPHY;  Surgeon: [name], MD;  Location: [place] [facility] LAB;  Service: Cardiovascular;  Laterality: N/A;  . TEE WITHOUT CARDIOVERSION N/A 07/04/2017   Procedure: TRANSESOPHAGEAL ECHOCARDIOGRAM (TEE);  Surgeon: [name], MD;  Location: [facility] ENDOSCOPY;  Service: Cardiovascular;  Laterality: N/A;  . TEE WITHOUT CARDIOVERSION N/A 03/09/2018   Procedure: TRANSESOPHAGEAL ECHOCARDIOGRAM (TEE);  Surgeon: [name], MD;   Location: [facility] ENDOSCOPY;  Service: Cardiovascular;  Laterality: N/A;    SOCIAL HISTORY: Social History   Tobacco Use  . Smoking status: Never Smoker  . Smokeless tobacco: Never Used  Substance Use Topics  . Alcohol use: Yes    Comment: little  . Drug use: No    FAMILY HISTORY: Family History  Problem Relation Age of Onset  . Heart disease Mother   . Hypertension Mother   . Hyperlipidemia Mother   . Diabetes Mother   . Sudden death Mother   . Thyroid disease Mother   . Obesity Mother   . Heart disease Father   . Heart attack Father   . Hypertension Father   . Hyperlipidemia Father   . Diabetes Father     ROS: Review of Systems  Constitutional: Negative for weight loss.    PHYSICAL EXAM: Blood pressure (!) 144/73, pulse 73, temperature 98.1 F (36.7 C), temperature source Oral, height 5\' 5"  (1.651 m), weight 234 lb (106.1 kg), SpO2 95 %.  Body mass index is 38.94 kg/m. Physical Exam Vitals signs reviewed.  Constitutional:      Appearance: Normal appearance. She is obese.  Cardiovascular:     Rate and Rhythm: Normal rate.     Pulses: Normal pulses.  Pulmonary:     Effort: Pulmonary effort is normal.     Breath sounds: Normal breath sounds.  Musculoskeletal: Normal range of motion.  Skin:    General: Skin is warm and dry.  Neurological:     Mental Status: She is alert and oriented to person, place, and time.  Psychiatric:        Mood and Affect: Mood normal.        Behavior: Behavior normal.     RECENT LABS AND TESTS: BMET    Component Value Date/Time   NA 137 09/14/2018 1449   NA 141 05/17/2018 1154   K 4.0 09/14/2018 1449   CL 102 09/14/2018 1449   CO2 26 09/14/2018 1449   GLUCOSE 121 (H) 09/14/2018 1449   BUN 23 09/14/2018 1449   BUN 16 05/17/2018 1154   CREATININE 0.92 09/14/2018 1449   CALCIUM 9.2 09/14/2018 1449   GFRNONAA >60 09/14/2018 1449   GFRAA >60 09/14/2018 1449   Lab Results  Component Value Date   HGBA1C 5.8 (H) 09/21/2018    HGBA1C 6.0 (H) 05/17/2018   HGBA1C 5.8 (H) 11/02/2017   HGBA1C 5.8 (H) 06/26/2017   Lab Results  Component Value Date   INSULIN 24.4 09/21/2018   INSULIN 16.1 05/17/2018   INSULIN 8.5 11/02/2017   CBC    Component Value Date/Time   WBC 8.5 03/08/2018 1442   WBC 7.4 08/31/2017 0930   RBC 4.46 03/08/2018 1442   RBC 4.63 08/31/2017 0930   HGB 14.0 03/08/2018 1442   HCT 42.2 03/08/2018 1442   PLT 256 03/08/2018 1442   MCV 95 03/08/2018 1442   MCH 31.4 03/08/2018 1442   MCH 30.2 08/31/2017 0930   MCHC 33.2 03/08/2018 1442   MCHC 33.2 08/31/2017 0930   RDW 13.4 03/08/2018 1442   LYMPHSABS 2.5 03/08/2018 1442   MONOABS 1.2 (H) 02/23/2015 0619   EOSABS 0.1 03/08/2018 1442   BASOSABS 0.0 03/08/2018 1442   Iron/TIBC/Ferritin/ %Sat No results found for: IRON, TIBC, FERRITIN, IRONPCTSAT Lipid Panel     Component Value Date/Time   CHOL 170 09/21/2018 0951   TRIG 131 09/21/2018 0951   HDL 32 (L) 09/21/2018 0951   CHOLHDL 5.4 06/26/2017 0517   VLDL 15 06/26/2017 0517   LDLCALC 112 (H) 09/21/2018 0951   Hepatic Function Panel     Component Value Date/Time   PROT 7.5 05/17/2018 1154   ALBUMIN 4.2 05/17/2018 1154   AST 24 05/17/2018 1154   ALT 15 05/17/2018 1154   ALKPHOS 86 05/17/2018 1154   BILITOT 0.6 05/17/2018 1154      Component Value Date/Time   TSH 4.610 (H) 09/21/2018 0951   TSH 3.010 11/02/2017 1254   TSH 4.410 06/24/2017 1046      OBESITY BEHAVIORAL INTERVENTION VISIT  Today's visit was # 21   Starting weight: 249 lbs Starting date: 11/02/17 Today's weight : 234 lbs Today's date: 12/05/2018 Total lbs lost to date: 15 At least 15 minutes were spent on discussing the following behavioral intervention visit.   ASK: We discussed the diagnosis of obesity with [name] today and Jamala agreed to give [name] permission to discuss obesity behavioral modification therapy today.  ASSESS: Cynthie has the diagnosis of obesity and her  BMI today is 38.94  Lumen is in the action stage of change   ADVISE: Alexandr was educated on the multiple health risks of obesity as well as the benefit of weight loss to improve her health. She was advised of the need for long term treatment and the importance of lifestyle modifications to improve her current health and to decrease her risk of future health problems.  AGREE: Multiple dietary modification options and treatment options were discussed and  Elga agreed to follow the recommendations documented in the above note.  ARRANGE: Alona was educated on the importance of frequent visits to treat obesity as outlined per CMS and USPSTF guidelines and agreed to schedule her next follow up appointment today.  I, Trixie Dredge, am acting as transcriptionist for Ilene Qua, MD  I have reviewed the above documentation for accuracy and completeness, and I agree with the above. - Ilene Qua, MD

## 2018-12-07 DIAGNOSIS — M7662 Achilles tendinitis, left leg: Secondary | ICD-10-CM

## 2018-12-07 HISTORY — DX: Achilles tendinitis, left leg: M76.62

## 2018-12-14 ENCOUNTER — Ambulatory Visit (HOSPITAL_COMMUNITY)
Admission: RE | Admit: 2018-12-14 | Discharge: 2018-12-14 | Disposition: A | Discharge status: Home / Self Care | Payer: Medicare Other | Source: Ambulatory Visit | Attending: Nurse Practitioner | Admitting: Nurse Practitioner

## 2018-12-14 ENCOUNTER — Encounter (INDEPENDENT_AMBULATORY_CARE_PROVIDER_SITE_OTHER): Payer: Self-pay

## 2018-12-14 ENCOUNTER — Ambulatory Visit (HOSPITAL_BASED_OUTPATIENT_CLINIC_OR_DEPARTMENT_OTHER)
Admission: RE | Admit: 2018-12-14 | Discharge: 2018-12-14 | Disposition: A | Discharge status: Home / Self Care | Payer: Medicare Other | Source: Ambulatory Visit | Attending: Nurse Practitioner | Admitting: Nurse Practitioner

## 2018-12-14 ENCOUNTER — Encounter (HOSPITAL_COMMUNITY): Payer: Self-pay | Admitting: Nurse Practitioner

## 2018-12-14 ENCOUNTER — Other Ambulatory Visit: Payer: Self-pay

## 2018-12-14 VITALS — BP 136/70 | HR 63 | Ht 65.0 in | Wt 238.0 lb

## 2018-12-14 DIAGNOSIS — Z79899 Other long term (current) drug therapy: Secondary | ICD-10-CM | POA: Insufficient documentation

## 2018-12-14 DIAGNOSIS — Z882 Allergy status to sulfonamides status: Secondary | ICD-10-CM | POA: Diagnosis not present

## 2018-12-14 DIAGNOSIS — Z886 Allergy status to analgesic agent status: Secondary | ICD-10-CM | POA: Insufficient documentation

## 2018-12-14 DIAGNOSIS — M797 Fibromyalgia: Secondary | ICD-10-CM | POA: Diagnosis not present

## 2018-12-14 DIAGNOSIS — M1990 Unspecified osteoarthritis, unspecified site: Secondary | ICD-10-CM | POA: Insufficient documentation

## 2018-12-14 DIAGNOSIS — Z8249 Family history of ischemic heart disease and other diseases of the circulatory system: Secondary | ICD-10-CM | POA: Insufficient documentation

## 2018-12-14 DIAGNOSIS — I4819 Other persistent atrial fibrillation: Secondary | ICD-10-CM | POA: Diagnosis not present

## 2018-12-14 DIAGNOSIS — G4733 Obstructive sleep apnea (adult) (pediatric): Secondary | ICD-10-CM | POA: Diagnosis not present

## 2018-12-14 DIAGNOSIS — E669 Obesity, unspecified: Secondary | ICD-10-CM | POA: Diagnosis not present

## 2018-12-14 DIAGNOSIS — Z6839 Body mass index (BMI) 39.0-39.9, adult: Secondary | ICD-10-CM | POA: Insufficient documentation

## 2018-12-14 DIAGNOSIS — Z833 Family history of diabetes mellitus: Secondary | ICD-10-CM | POA: Diagnosis not present

## 2018-12-14 DIAGNOSIS — Z9049 Acquired absence of other specified parts of digestive tract: Secondary | ICD-10-CM | POA: Insufficient documentation

## 2018-12-14 DIAGNOSIS — Z7901 Long term (current) use of anticoagulants: Secondary | ICD-10-CM | POA: Insufficient documentation

## 2018-12-14 DIAGNOSIS — J45909 Unspecified asthma, uncomplicated: Secondary | ICD-10-CM | POA: Diagnosis not present

## 2018-12-14 DIAGNOSIS — Z7984 Long term (current) use of oral hypoglycemic drugs: Secondary | ICD-10-CM | POA: Insufficient documentation

## 2018-12-14 DIAGNOSIS — Z91048 Other nonmedicinal substance allergy status: Secondary | ICD-10-CM | POA: Diagnosis not present

## 2018-12-14 LAB — BASIC METABOLIC PANEL
Anion gap: 9 (ref 5–15)
BUN: 21 mg/dL (ref 8–23)
CO2: 25 mmol/L (ref 22–32)
Calcium: 9.3 mg/dL (ref 8.9–10.3)
Chloride: 104 mmol/L (ref 98–111)
Creatinine, Ser: 0.87 mg/dL (ref 0.44–1.00)
GFR calc Af Amer: >60 mL/min (ref >60)
GFR calc non Af Amer: >60 mL/min (ref >60)
Glucose, Bld: 93 mg/dL (ref 70–99)
Potassium: 3.9 mmol/L (ref 3.5–5.1)
Sodium: 138 mmol/L (ref 135–145)

## 2018-12-14 LAB — MAGNESIUM: Magnesium: 2.3 mg/dL (ref 1.7–2.4)

## 2018-12-14 NOTE — Progress Notes (Addendum)
Primary Care Physician: Wallie Char, FNP Referring Physician:Dr. [name] is a 63 y.o. female with a h/o persistent afib that was having  breakthrough afib with Tikosyn and had an afib ablation 03/09/18 with [name]. She reports doing very well and not appreciating any aifb.   She stopped her Eliquis after her last visit with Dr [name] by accident but is now back on drug. She states that she is having foot surgery mid July and has been told to stop her anticoagulation 2 days prior.   Today, she denies symptoms of palpitations, chest pain, shortness of breath, orthopnea, PND, lower extremity edema, dizziness, presyncope, syncope, or neurologic sequela. The patient is tolerating medications without difficulties and is otherwise without complaint today.   Past Medical History:  Diagnosis Date  . Asthma   . Atrial fibrillation ([place])   . Back pain   . Diastolic dysfunction   . Fibromyalgia   . Gestational diabetes   . Knee pain   . Osteoarthritis   . Persistent atrial fibrillation   . Sleep apnea   . Visit for monitoring Tikosyn therapy 02/07/2018   Past Surgical History:  Procedure Laterality Date  . ABDOMINAL HYSTERECTOMY    . ABLATION OF DYSRHYTHMIC FOCUS  03/09/2018  . APPENDECTOMY    . ATRIAL FIBRILLATION ABLATION N/A 03/09/2018   Procedure: ATRIAL FIBRILLATION ABLATION;  Surgeon: [name], MD;  Location: [place] CV LAB;  Service: Cardiovascular;  Laterality: N/A;  . CARDIOVERSION N/A 07/04/2017   Procedure: CARDIOVERSION;  Surgeon: [name], MD;  Location: [facility] ENDOSCOPY;  Service: Cardiovascular;  Laterality: N/A;  . CARDIOVERSION N/A 09/06/2017   Procedure: CARDIOVERSION;  Surgeon: [name], MD;  Location: [facility] ENDOSCOPY;  Service: Cardiovascular;  Laterality: N/A;  . CHOLECYSTECTOMY N/A 02/24/2015   Procedure: LAPAROSCOPIC CHOLECYSTECTOMY;  Surgeon: [name], MD;  Location: [place];  Service: General;  Laterality: N/A;  . RIGHT/LEFT  HEART CATH AND CORONARY ANGIOGRAPHY N/A 08/09/2017   Procedure: RIGHT/LEFT HEART CATH AND CORONARY ANGIOGRAPHY;  Surgeon: [name], MD;  Location: [place] CV LAB;  Service: Cardiovascular;  Laterality: N/A;  . TEE WITHOUT CARDIOVERSION N/A 07/04/2017   Procedure: TRANSESOPHAGEAL ECHOCARDIOGRAM (TEE);  Surgeon: [name], MD;  Location: [facility] ENDOSCOPY;  Service: Cardiovascular;  Laterality: N/A;  . TEE WITHOUT CARDIOVERSION N/A 03/09/2018   Procedure: TRANSESOPHAGEAL ECHOCARDIOGRAM (TEE);  Surgeon: [name], MD;  Location: [facility] ENDOSCOPY;  Service: Cardiovascular;  Laterality: N/A;    Current Outpatient Medications  Medication Sig Dispense Refill  . acetaminophen (TYLENOL) 325 MG tablet Take 325 mg by mouth every 6 (six) hours as needed for moderate pain or headache.    [name] apixaban (ELIQUIS) 5 MG TABS tablet Take 1 tablet (5 mg total) by mouth 2 (two) times daily. 60 tablet 6  . diltiazem (CARDIZEM CD) 120 MG 24 hr capsule Take 1 capsule (120 mg total) by mouth 2 (two) times daily. 180 capsule 2  . dofetilide (TIKOSYN) 125 MCG capsule Take 1 capsule (125 mcg total) by mouth 2 (two) times daily. 180 capsule 3  . furosemide (LASIX) 40 MG tablet Take 40 mg by mouth.    . magnesium gluconate (MAGONATE) 30 MG tablet Take 1 tablet (30 mg total) by mouth daily. 30 tablet 0  . magnesium oxide (MAGNESIUM-OXIDE) 400 (241.3 Mg) MG tablet Take 1 tablet (400 mg total) by mouth daily. 30 tablet 6  . metFORMIN (GLUCOPHAGE) 500 MG tablet Take 1 tablet (500 mg  total) by mouth daily with breakfast. 30 tablet 0  . Multiple Vitamin (MULTIVITAMIN WITH MINERALS) TABS tablet Take 1 tablet by mouth daily.     . Multiple Vitamins-Minerals (WOMENS MULTIVITAMIN) TABS Take 1 tablet by mouth daily. 90 tablet 0  . potassium chloride SA (K-DUR,KLOR-CON) 20 MEQ tablet Take 1 tablet (20 mEq total) by mouth daily. Take an extra 1/2 tablet by mouth on days when you take the extra fluid pill (furosemide/Lasix).    .  Vitamin D, Ergocalciferol, (DRISDOL) 1.25 MG (50000 UT) CAPS capsule Take 1 capsule (50,000 Units total) by mouth every 7 (seven) days. TAKE 1 CAPSULE BY MOUTH EVERY 7 DAYS 12 capsule 0   No current facility-administered medications for this encounter.     Allergies  Allergen Reactions  . Hydrocodone-Acetaminophen Nausea And Vomiting  . Other Other (See Comments)    Metals Polyester - including hospital gowns and sheets - allergic contact dermatitis  . Sulfur Hives    Social History   Socioeconomic History  . Marital status: Married    Spouse name: Not on file  . Number of children: 4  . Years of education: Not on file  . Highest education level: Not on file  Occupational History  . Occupation: Disabled/retired  Social Needs  . Financial resource strain: Not on file  . Food insecurity    Worry: Not on file    Inability: Not on file  . Transportation needs    Medical: Not on file    Non-medical: Not on file  Tobacco Use  . Smoking status: Never Smoker  . Smokeless tobacco: Never Used  Substance and Sexual Activity  . Alcohol use: Yes    Comment: little  . Drug use: No  . Sexual activity: Not Currently  Lifestyle  . Physical activity    Days per week: Not on file    Minutes per session: Not on file  . Stress: Not on file  Relationships  . Social Herbalist on phone: Not on file    Gets together: Not on file    Attends religious service: Not on file    Active member of club or organization: Not on file    Attends meetings of clubs or organizations: Not on file    Relationship status: Not on file  . Intimate partner violence    Fear of current or ex partner: Not on file    Emotionally abused: Not on file    Physically abused: Not on file    Forced sexual activity: Not on file  Other Topics Concern  . Not on file  Social History Narrative   Lives in [place]   Not working    Family History  Problem Relation Age of Onset  . Heart disease Mother    . Hypertension Mother   . Hyperlipidemia Mother   . Diabetes Mother   . Sudden death Mother   . Thyroid disease Mother   . Obesity Mother   . Heart disease Father   . Heart attack Father   . Hypertension Father   . Hyperlipidemia Father   . Diabetes Father     ROS- All systems are reviewed and negative except as per the HPI above  Physical Exam: Vitals:   12/14/18 1443  BP: 136/70  Pulse: 63  Weight: 108 kg  Height: 5\' 5"  (1.651 m)   Wt Readings from Last 3 Encounters:  12/14/18 108 kg  12/05/18 106.1 kg  09/14/18 107.5 kg  Labs: Lab Results  Component Value Date   NA 137 09/14/2018   K 4.0 09/14/2018   CL 102 09/14/2018   CO2 26 09/14/2018   GLUCOSE 121 (H) 09/14/2018   BUN 23 09/14/2018   CREATININE 0.92 09/14/2018   CALCIUM 9.2 09/14/2018   MG 2.1 09/14/2018   Lab Results  Component Value Date   INR 1.07 08/09/2017   Lab Results  Component Value Date   CHOL 170 09/21/2018   HDL 32 (L) 09/21/2018   LDLCALC 112 (H) 09/21/2018   TRIG 131 09/21/2018    GEN- The patient is well appearing obese female, alert and oriented x 3 today.   HEENT-head normocephalic, atraumatic, sclera clear, conjunctiva pink, hearing intact, trachea midline. Lungs- Clear to ausculation bilaterally, normal work of breathing Heart- Regular rate and rhythm, no murmurs, rubs or gallops  GI- soft, NT, ND, + BS Extremities- no clubbing, cyanosis, or edema MS- no significant deformity or atrophy Skin- no rash or lesion Psych- euthymic mood, full affect Neuro- strength and sensation are intact   EKG- SR with  HR of  73, PR 176, QRS 84, QTc 442  Epic records reviewed   Assessment and Plan:  1. Persistent Atrial fibrillation Doing well s/p ablation 03/09/18. Has not had any symptoms of afib. Continue  Tikosyn stable qtc. Continue diltiazem daily Continue Eliquis 5 mg BID. Bmet/mag today  2. Systolic dysfunction EF 40-37% on TEE Echo done today to reassess EF in SR   3. Obesity Body mass index is 39.61 kg/m. Lifestyle modification was discussed and encouraged including regular physical activity and weight reduction.  4. OSA Now using oral appliance from Dr Ron Parker. Encouraged compliance.   Follow up with AF clinic in 4 months.  Geroge Baseman Juwan Vences, [facility] [address] [place], [place] [address] [phone]

## 2018-12-15 ENCOUNTER — Other Ambulatory Visit: Payer: Self-pay

## 2018-12-15 ENCOUNTER — Encounter (HOSPITAL_COMMUNITY): Payer: Self-pay | Admitting: *Deleted

## 2018-12-15 ENCOUNTER — Encounter (HOSPITAL_BASED_OUTPATIENT_CLINIC_OR_DEPARTMENT_OTHER): Payer: Self-pay | Admitting: *Deleted

## 2018-12-15 NOTE — Addendum Note (Signed)
Encounter addended by: Sherran Needs, NP on: 12/15/2018 8:47 AM  Actions taken: Clinical Note Signed

## 2018-12-18 ENCOUNTER — Other Ambulatory Visit (HOSPITAL_COMMUNITY)
Admission: RE | Admit: 2018-12-18 | Discharge: 2018-12-18 | Disposition: A | Discharge status: Home / Self Care | Payer: Medicare Other | Source: Ambulatory Visit | Attending: Orthopedic Surgery | Admitting: Orthopedic Surgery

## 2018-12-18 DIAGNOSIS — Z1159 Encounter for screening for other viral diseases: Secondary | ICD-10-CM | POA: Insufficient documentation

## 2018-12-18 LAB — SARS CORONAVIRUS 2 (TAT 6-24 HRS): SARS Coronavirus 2: NEGATIVE

## 2018-12-19 ENCOUNTER — Other Ambulatory Visit: Payer: Self-pay

## 2018-12-19 ENCOUNTER — Ambulatory Visit (INDEPENDENT_AMBULATORY_CARE_PROVIDER_SITE_OTHER): Payer: Medicare Other | Admitting: Family Medicine

## 2018-12-19 ENCOUNTER — Encounter (INDEPENDENT_AMBULATORY_CARE_PROVIDER_SITE_OTHER): Payer: Self-pay | Admitting: Family Medicine

## 2018-12-19 ENCOUNTER — Telehealth (INDEPENDENT_AMBULATORY_CARE_PROVIDER_SITE_OTHER): Payer: Medicare Other | Admitting: Family Medicine

## 2018-12-19 DIAGNOSIS — R7303 Prediabetes: Secondary | ICD-10-CM | POA: Diagnosis not present

## 2018-12-19 DIAGNOSIS — Z6839 Body mass index (BMI) 39.0-39.9, adult: Secondary | ICD-10-CM

## 2018-12-19 MED ORDER — MAGNESIUM GLUCONATE 30 MG PO TABS: 30.0000 mg | ORAL_TABLET | Freq: Every day | ORAL | 0 refills | Status: DC

## 2018-12-20 NOTE — Progress Notes (Signed)
Office: [phone]  /  Fax: [phone] TeleHealth Visit:  Christie Atkinson has verbally consented to this TeleHealth visit today. The patient is located at home, the provider is located at the [employer] [facility] office. The participants in this visit include the listed provider and patient. Christie Atkinson was unable to use realtime audiovisual technology today and the telehealth visit was conducted via telephone.   HPI:   Chief Complaint: OBESITY Christie Atkinson is here to discuss her progress with her obesity treatment plan. She is on the keep a food journal with 450-600 calories and 40+ grams of protein at supper daily and follow the Category 3 plan and is following her eating plan approximately 90 % of the time. She states she is walking 4-5 miles 5-6 times per week. Christie Atkinson had significant sweet cravings last week and did indulge a bit. She is planning on having surgery Thursday so she will try to get food prepared in case she cannot stand and cook.  We were unable to weigh the patient today for this TeleHealth visit. She feels as if she has maintained her weight since her last visit. She has lost 15 lbs since starting treatment with [name].  Hypomagnesemia Christie Atkinson is not able to get her prescription at [facility]. She started OTC and noticed significant improvement.  Pre-Diabetes Christie Atkinson has a diagnosis of pre-diabetes based on her elevated Hgb A1c and was informed this puts her at greater risk of developing diabetes. She is taking metformin currently and notes carbohydrate cravings last week. She is planning to follow the meal plan while recovering. She continues to work on diet and exercise to decrease risk of diabetes. She denies nausea or hypoglycemia.  ASSESSMENT AND PLAN:  Prediabetes  Hypomagnesemia  Class 2 severe obesity with serious comorbidity and body mass index (BMI) of 39.0 to 39.9 in adult, unspecified obesity type ([place])  PLAN:  Hypomagnesemia Christie Atkinson agrees to continue  taking magnesium gluconate 30 mg PO daily #30 and we will refill for 1 month (prescription sent to [facility]). Christie Atkinson agrees to follow up with our clinic in 2 to 3 weeks.  Pre-Diabetes Christie Atkinson will continue to work on weight loss, exercise, and decreasing simple carbohydrates in her diet to help decrease the risk of diabetes. We dicussed metformin including benefits and risks. She was informed that eating too many simple carbohydrates or too many calories at one sitting increases the likelihood of GI side effects. Christie Atkinson agrees to continue taking metformin, and we will repeat labs at her next in office appointment. Christie Atkinson agrees to follow up with our clinic in 2 to 3 weeks as directed to monitor her progress.  Obesity Christie Atkinson is currently in the action stage of change. As such, her goal is to continue with weight loss efforts She has agreed to follow the Category 2 plan Christie Atkinson has been instructed to work up to a goal of 150 minutes of combined cardio and strengthening exercise per week for weight loss and overall health benefits. We discussed the following Behavioral Modification Strategies today: increasing lean protein intake, increasing vegetables and work on meal planning and easy cooking plans, keeping healthy foods in the home, and planning for success   Christie Atkinson has agreed to follow up with our clinic in 2 to 3 weeks. She was informed of the importance of frequent follow up visits to maximize her success with intensive lifestyle modifications for her multiple health conditions.  ALLERGIES: Allergies  Allergen Reactions  . Hydrocodone-Acetaminophen Nausea And Vomiting  . Other Other (  See Comments)    Metals Polyester - including hospital gowns and sheets - allergic contact dermatitis  . Sulfur Hives    MEDICATIONS: Current Outpatient Medications on File Prior to Visit  Medication Sig Dispense Refill  . acetaminophen (TYLENOL) 325 MG tablet Take 325 mg by mouth every 6 (six) hours  as needed for moderate pain or headache.    [name] apixaban (ELIQUIS) 5 MG TABS tablet Take 1 tablet (5 mg total) by mouth 2 (two) times daily. 60 tablet 6  . diltiazem (CARDIZEM CD) 120 MG 24 hr capsule Take 1 capsule (120 mg total) by mouth 2 (two) times daily. 180 capsule 2  . dofetilide (TIKOSYN) 125 MCG capsule Take 1 capsule (125 mcg total) by mouth 2 (two) times daily. 180 capsule 3  . furosemide (LASIX) 40 MG tablet Take 40 mg by mouth.    . metFORMIN (GLUCOPHAGE) 500 MG tablet Take 1 tablet (500 mg total) by mouth daily with breakfast. 30 tablet 0  . Multiple Vitamin (MULTIVITAMIN WITH MINERALS) TABS tablet Take 1 tablet by mouth daily.     . Multiple Vitamins-Minerals (WOMENS MULTIVITAMIN) TABS Take 1 tablet by mouth daily. 90 tablet 0  . potassium chloride SA (K-DUR,KLOR-CON) 20 MEQ tablet Take 1 tablet (20 mEq total) by mouth daily. Take an extra 1/2 tablet by mouth on days when you take the extra fluid pill (furosemide/Lasix).    . Vitamin D, Ergocalciferol, (DRISDOL) 1.25 MG (50000 UT) CAPS capsule Take 1 capsule (50,000 Units total) by mouth every 7 (seven) days. TAKE 1 CAPSULE BY MOUTH EVERY 7 DAYS 12 capsule 0   No current facility-administered medications on file prior to visit.     PAST MEDICAL HISTORY: Past Medical History:  Diagnosis Date  . Asthma   . Atrial fibrillation ([place])   . Back pain   . Diastolic dysfunction   . Fibromyalgia   . Gestational diabetes   . Hypertension   . Knee pain   . Osteoarthritis   . Persistent atrial fibrillation   . Sleep apnea    wears oral appliance  . Visit for monitoring Tikosyn therapy 02/07/2018    PAST SURGICAL HISTORY: Past Surgical History:  Procedure Laterality Date  . ABDOMINAL HYSTERECTOMY    . ABLATION OF DYSRHYTHMIC FOCUS  03/09/2018  . APPENDECTOMY    . ATRIAL FIBRILLATION ABLATION N/A 03/09/2018   Procedure: ATRIAL FIBRILLATION ABLATION;  Surgeon: [name], MD;  Location: [place] [facility];  Service:  Cardiovascular;  Laterality: N/A;  . CARDIOVERSION N/A 07/04/2017   Procedure: CARDIOVERSION;  Surgeon: [name], MD;  Location: [facility] ENDOSCOPY;  Service: Cardiovascular;  Laterality: N/A;  . CARDIOVERSION N/A 09/06/2017   Procedure: CARDIOVERSION;  Surgeon: [name], MD;  Location: [facility] ENDOSCOPY;  Service: Cardiovascular;  Laterality: N/A;  . CHOLECYSTECTOMY N/A 02/24/2015   Procedure: LAPAROSCOPIC CHOLECYSTECTOMY;  Surgeon: [name], MD;  Location: [place];  Service: General;  Laterality: N/A;  . RIGHT/LEFT HEART CATH AND CORONARY ANGIOGRAPHY N/A 08/09/2017   Procedure: RIGHT/LEFT HEART CATH AND CORONARY ANGIOGRAPHY;  Surgeon: [name], MD;  Location: [place] [facility];  Service: Cardiovascular;  Laterality: N/A;  . TEE WITHOUT CARDIOVERSION N/A 07/04/2017   Procedure: TRANSESOPHAGEAL ECHOCARDIOGRAM (TEE);  Surgeon: [name], MD;  Location: [facility] ENDOSCOPY;  Service: Cardiovascular;  Laterality: N/A;  . TEE WITHOUT CARDIOVERSION N/A 03/09/2018   Procedure: TRANSESOPHAGEAL ECHOCARDIOGRAM (TEE);  Surgeon: [name], MD;  Location: [place];  Service: Cardiovascular;  Laterality: N/A;  SOCIAL HISTORY: Social History   Tobacco Use  . Smoking status: Never Smoker  . Smokeless tobacco: Never Used  Substance Use Topics  . Alcohol use: Yes    Comment: little  . Drug use: No    FAMILY HISTORY: Family History  Problem Relation Age of Onset  . Heart disease Mother   . Hypertension Mother   . Hyperlipidemia Mother   . Diabetes Mother   . Sudden death Mother   . Thyroid disease Mother   . Obesity Mother   . Heart disease Father   . Heart attack Father   . Hypertension Father   . Hyperlipidemia Father   . Diabetes Father     ROS: Review of Systems  Constitutional: Negative for weight loss.  Gastrointestinal: Negative for nausea.  Endo/Heme/Allergies:       Negative hypoglycemia    PHYSICAL EXAM: Pt in no acute distress  RECENT LABS AND TESTS:  BMET    Component Value Date/Time   NA 138 12/14/2018 1500   NA 141 05/17/2018 1154   K 3.9 12/14/2018 1500   CL 104 12/14/2018 1500   CO2 25 12/14/2018 1500   GLUCOSE 93 12/14/2018 1500   BUN 21 12/14/2018 1500   BUN 16 05/17/2018 1154   CREATININE 0.87 12/14/2018 1500   CALCIUM 9.3 12/14/2018 1500   GFRNONAA >60 12/14/2018 1500   GFRAA >60 12/14/2018 1500   Lab Results  Component Value Date   HGBA1C 5.8 (H) 09/21/2018   HGBA1C 6.0 (H) 05/17/2018   HGBA1C 5.8 (H) 11/02/2017   HGBA1C 5.8 (H) 06/26/2017   Lab Results  Component Value Date   INSULIN 24.4 09/21/2018   INSULIN 16.1 05/17/2018   INSULIN 8.5 11/02/2017   CBC    Component Value Date/Time   WBC 8.5 03/08/2018 1442   WBC 7.4 08/31/2017 0930   RBC 4.46 03/08/2018 1442   RBC 4.63 08/31/2017 0930   HGB 14.0 03/08/2018 1442   HCT 42.2 03/08/2018 1442   PLT 256 03/08/2018 1442   MCV 95 03/08/2018 1442   MCH 31.4 03/08/2018 1442   MCH 30.2 08/31/2017 0930   MCHC 33.2 03/08/2018 1442   MCHC 33.2 08/31/2017 0930   RDW 13.4 03/08/2018 1442   LYMPHSABS 2.5 03/08/2018 1442   MONOABS 1.2 (H) 02/23/2015 0619   EOSABS 0.1 03/08/2018 1442   BASOSABS 0.0 03/08/2018 1442   Iron/TIBC/Ferritin/ %Sat No results found for: IRON, TIBC, FERRITIN, IRONPCTSAT Lipid Panel     Component Value Date/Time   CHOL 170 09/21/2018 0951   TRIG 131 09/21/2018 0951   HDL 32 (L) 09/21/2018 0951   CHOLHDL 5.4 06/26/2017 0517   VLDL 15 06/26/2017 0517   LDLCALC 112 (H) 09/21/2018 0951   Hepatic Function Panel     Component Value Date/Time   PROT 7.5 05/17/2018 1154   ALBUMIN 4.2 05/17/2018 1154   AST 24 05/17/2018 1154   ALT 15 05/17/2018 1154   ALKPHOS 86 05/17/2018 1154   BILITOT 0.6 05/17/2018 1154      Component Value Date/Time   TSH 4.610 (H) 09/21/2018 0951   TSH 3.010 11/02/2017 1254   TSH 4.410 06/24/2017 1046      I, Trixie Dredge, am acting as transcriptionist for Ilene Qua, MD  I have reviewed  the above documentation for accuracy and completeness, and I agree with the above. - Ilene Qua, MD

## 2018-12-21 ENCOUNTER — Ambulatory Visit (HOSPITAL_BASED_OUTPATIENT_CLINIC_OR_DEPARTMENT_OTHER)
Admission: RE | Admit: 2018-12-21 | Discharge: 2018-12-21 | Disposition: A | Discharge status: Home / Self Care | Payer: Medicare Other | Attending: Orthopedic Surgery | Admitting: Orthopedic Surgery

## 2018-12-21 ENCOUNTER — Ambulatory Visit (HOSPITAL_BASED_OUTPATIENT_CLINIC_OR_DEPARTMENT_OTHER): Payer: Medicare Other | Admitting: Certified Registered"

## 2018-12-21 ENCOUNTER — Encounter (HOSPITAL_BASED_OUTPATIENT_CLINIC_OR_DEPARTMENT_OTHER)
Admission: RE | Disposition: A | Discharge status: Home / Self Care | Payer: Self-pay | Source: Home / Self Care | Attending: Orthopedic Surgery

## 2018-12-21 ENCOUNTER — Other Ambulatory Visit: Payer: Self-pay

## 2018-12-21 ENCOUNTER — Encounter (HOSPITAL_BASED_OUTPATIENT_CLINIC_OR_DEPARTMENT_OTHER): Payer: Self-pay | Admitting: Certified Registered"

## 2018-12-21 DIAGNOSIS — I10 Essential (primary) hypertension: Secondary | ICD-10-CM | POA: Diagnosis not present

## 2018-12-21 DIAGNOSIS — G4730 Sleep apnea, unspecified: Secondary | ICD-10-CM | POA: Diagnosis not present

## 2018-12-21 DIAGNOSIS — I4819 Other persistent atrial fibrillation: Secondary | ICD-10-CM | POA: Insufficient documentation

## 2018-12-21 DIAGNOSIS — Z7901 Long term (current) use of anticoagulants: Secondary | ICD-10-CM | POA: Insufficient documentation

## 2018-12-21 DIAGNOSIS — M62462 Contracture of muscle, left lower leg: Secondary | ICD-10-CM | POA: Diagnosis not present

## 2018-12-21 DIAGNOSIS — M797 Fibromyalgia: Secondary | ICD-10-CM | POA: Insufficient documentation

## 2018-12-21 DIAGNOSIS — M25775 Osteophyte, left foot: Secondary | ICD-10-CM | POA: Diagnosis not present

## 2018-12-21 DIAGNOSIS — Z7984 Long term (current) use of oral hypoglycemic drugs: Secondary | ICD-10-CM | POA: Insufficient documentation

## 2018-12-21 DIAGNOSIS — Z6839 Body mass index (BMI) 39.0-39.9, adult: Secondary | ICD-10-CM | POA: Diagnosis not present

## 2018-12-21 DIAGNOSIS — Z885 Allergy status to narcotic agent status: Secondary | ICD-10-CM | POA: Insufficient documentation

## 2018-12-21 DIAGNOSIS — E119 Type 2 diabetes mellitus without complications: Secondary | ICD-10-CM | POA: Insufficient documentation

## 2018-12-21 DIAGNOSIS — J45909 Unspecified asthma, uncomplicated: Secondary | ICD-10-CM | POA: Insufficient documentation

## 2018-12-21 DIAGNOSIS — M9262 Juvenile osteochondrosis of tarsus, left ankle: Secondary | ICD-10-CM | POA: Insufficient documentation

## 2018-12-21 DIAGNOSIS — M6702 Short Achilles tendon (acquired), left ankle: Secondary | ICD-10-CM | POA: Diagnosis not present

## 2018-12-21 DIAGNOSIS — Z91048 Other nonmedicinal substance allergy status: Secondary | ICD-10-CM | POA: Insufficient documentation

## 2018-12-21 DIAGNOSIS — G8918 Other acute postprocedural pain: Secondary | ICD-10-CM | POA: Diagnosis not present

## 2018-12-21 DIAGNOSIS — M7662 Achilles tendinitis, left leg: Secondary | ICD-10-CM | POA: Insufficient documentation

## 2018-12-21 DIAGNOSIS — Z882 Allergy status to sulfonamides status: Secondary | ICD-10-CM | POA: Insufficient documentation

## 2018-12-21 DIAGNOSIS — M1990 Unspecified osteoarthritis, unspecified site: Secondary | ICD-10-CM | POA: Diagnosis not present

## 2018-12-21 DIAGNOSIS — Z79899 Other long term (current) drug therapy: Secondary | ICD-10-CM | POA: Insufficient documentation

## 2018-12-21 DIAGNOSIS — M7732 Calcaneal spur, left foot: Secondary | ICD-10-CM | POA: Diagnosis not present

## 2018-12-21 HISTORY — PX: GASTROCNEMIUS RECESSION: SHX863

## 2018-12-21 HISTORY — PX: EXCISION HAGLUND'S DEFORMITY WITH ACHILLES TENDON REPAIR: SHX5627

## 2018-12-21 HISTORY — DX: Essential (primary) hypertension: I10

## 2018-12-21 LAB — GLUCOSE, CAPILLARY
Glucose-Capillary: 114 mg/dL — ABNORMAL HIGH (ref 70–99)
Glucose-Capillary: 148 mg/dL — ABNORMAL HIGH (ref 70–99)

## 2018-12-21 SURGERY — RECESSION, MUSCLE, GASTROCNEMIUS
Anesthesia: General | Site: Leg Lower | Laterality: Left

## 2018-12-21 MED ORDER — OXYCODONE HCL 5 MG PO TABS: 5.0000 mg | ORAL_TABLET | ORAL | 0 refills | Status: AC | PRN

## 2018-12-21 MED ORDER — ONDANSETRON HCL 4 MG/2ML IJ SOLN
INTRAMUSCULAR | Status: DC | PRN
  Administered 2018-12-21: 4 mg via INTRAVENOUS

## 2018-12-21 MED ORDER — PHENYLEPHRINE HCL (PRESSORS) 10 MG/ML IV SOLN
INTRAVENOUS | Status: DC | PRN
  Administered 2018-12-21 (×4): 40 ug via INTRAVENOUS
  Administered 2018-12-21: 80 ug via INTRAVENOUS

## 2018-12-21 MED ORDER — FENTANYL CITRATE (PF) 100 MCG/2ML IJ SOLN: 25.0000 ug | INTRAMUSCULAR | Status: DC | PRN

## 2018-12-21 MED ORDER — FENTANYL CITRATE (PF) 100 MCG/2ML IJ SOLN
INTRAMUSCULAR | Status: AC
  Filled 2018-12-21: qty 2

## 2018-12-21 MED ORDER — ONDANSETRON HCL 4 MG PO TABS: 4.0000 mg | ORAL_TABLET | Freq: Every day | ORAL | 0 refills | Status: DC | PRN

## 2018-12-21 MED ORDER — SODIUM CHLORIDE 0.9 % IV SOLN: INTRAVENOUS | Status: DC

## 2018-12-21 MED ORDER — CLONIDINE HCL (ANALGESIA) 100 MCG/ML EP SOLN
EPIDURAL | Status: DC | PRN
  Administered 2018-12-21: 100 ug

## 2018-12-21 MED ORDER — DEXAMETHASONE SODIUM PHOSPHATE 4 MG/ML IJ SOLN
INTRAMUSCULAR | Status: DC | PRN
  Administered 2018-12-21: 4 mg via INTRAVENOUS

## 2018-12-21 MED ORDER — PROPOFOL 500 MG/50ML IV EMUL
INTRAVENOUS | Status: DC | PRN
  Administered 2018-12-21: 25 ug/kg/min via INTRAVENOUS

## 2018-12-21 MED ORDER — ACETAMINOPHEN 500 MG PO TABS
1000.0000 mg | ORAL_TABLET | Freq: Once | ORAL | Status: AC
  Administered 2018-12-21: 1000 mg via ORAL

## 2018-12-21 MED ORDER — CEFAZOLIN SODIUM-DEXTROSE 2-4 GM/100ML-% IV SOLN
INTRAVENOUS | Status: AC
  Filled 2018-12-21: qty 100

## 2018-12-21 MED ORDER — CEFAZOLIN SODIUM-DEXTROSE 2-4 GM/100ML-% IV SOLN
2.0000 g | INTRAVENOUS | Status: AC
  Administered 2018-12-21: 2 g via INTRAVENOUS

## 2018-12-21 MED ORDER — FENTANYL CITRATE (PF) 100 MCG/2ML IJ SOLN
50.0000 ug | INTRAMUSCULAR | Status: DC | PRN
  Administered 2018-12-21 (×2): 50 ug via INTRAVENOUS

## 2018-12-21 MED ORDER — VANCOMYCIN HCL 500 MG IV SOLR
INTRAVENOUS | Status: DC | PRN
  Administered 2018-12-21: 500 mg via TOPICAL

## 2018-12-21 MED ORDER — PROPOFOL 10 MG/ML IV BOLUS
INTRAVENOUS | Status: DC | PRN
  Administered 2018-12-21: 150 mg via INTRAVENOUS

## 2018-12-21 MED ORDER — SENNA 8.6 MG PO TABS: 2.0000 | ORAL_TABLET | Freq: Two times a day (BID) | ORAL | 0 refills | Status: DC

## 2018-12-21 MED ORDER — SCOPOLAMINE 1 MG/3DAYS TD PT72: 1.0000 | MEDICATED_PATCH | Freq: Once | TRANSDERMAL | Status: DC

## 2018-12-21 MED ORDER — GABAPENTIN 300 MG PO CAPS
300.0000 mg | ORAL_CAPSULE | Freq: Once | ORAL | Status: AC
  Administered 2018-12-21: 300 mg via ORAL

## 2018-12-21 MED ORDER — ONDANSETRON HCL 4 MG/2ML IJ SOLN: 4.0000 mg | Freq: Once | INTRAMUSCULAR | Status: DC | PRN

## 2018-12-21 MED ORDER — BUPIVACAINE-EPINEPHRINE (PF) 0.5% -1:200000 IJ SOLN
INTRAMUSCULAR | Status: DC | PRN
  Administered 2018-12-21: 10 mL via PERINEURAL
  Administered 2018-12-21: 20 mL via PERINEURAL

## 2018-12-21 MED ORDER — 0.9 % SODIUM CHLORIDE (POUR BTL) OPTIME
TOPICAL | Status: DC | PRN
  Administered 2018-12-21: 150 mL

## 2018-12-21 MED ORDER — DOCUSATE SODIUM 100 MG PO CAPS: 100.0000 mg | ORAL_CAPSULE | Freq: Two times a day (BID) | ORAL | 0 refills | Status: DC

## 2018-12-21 MED ORDER — MIDAZOLAM HCL 2 MG/2ML IJ SOLN
1.0000 mg | INTRAMUSCULAR | Status: DC | PRN
  Administered 2018-12-21: 2 mg via INTRAVENOUS

## 2018-12-21 MED ORDER — MIDAZOLAM HCL 2 MG/2ML IJ SOLN
INTRAMUSCULAR | Status: AC
  Filled 2018-12-21: qty 2

## 2018-12-21 MED ORDER — GABAPENTIN 300 MG PO CAPS
ORAL_CAPSULE | ORAL | Status: AC
  Filled 2018-12-21: qty 1

## 2018-12-21 MED ORDER — PHENYLEPHRINE HCL (PRESSORS) 10 MG/ML IV SOLN
INTRAVENOUS | Status: AC
  Filled 2018-12-21: qty 2

## 2018-12-21 MED ORDER — VANCOMYCIN HCL 500 MG IV SOLR
INTRAVENOUS | Status: AC
  Filled 2018-12-21: qty 500

## 2018-12-21 MED ORDER — CHLORHEXIDINE GLUCONATE 4 % EX LIQD: 60.0000 mL | Freq: Once | CUTANEOUS | Status: DC

## 2018-12-21 MED ORDER — SUCCINYLCHOLINE CHLORIDE 20 MG/ML IJ SOLN
INTRAMUSCULAR | Status: DC | PRN
  Administered 2018-12-21: 100 mg via INTRAVENOUS

## 2018-12-21 MED ORDER — EPHEDRINE SULFATE 50 MG/ML IJ SOLN
INTRAMUSCULAR | Status: DC | PRN
  Administered 2018-12-21: 10 mg via INTRAVENOUS

## 2018-12-21 MED ORDER — LACTATED RINGERS IV SOLN
INTRAVENOUS | Status: DC
  Administered 2018-12-21: 10:00:00 via INTRAVENOUS

## 2018-12-21 MED ORDER — SODIUM CHLORIDE 0.9 % IV SOLN
INTRAVENOUS | Status: DC | PRN
  Administered 2018-12-21: 40 ug/min via INTRAVENOUS

## 2018-12-21 SURGICAL SUPPLY — 87 items
ANCHOR JUGGERKNOT WTAP NDL 2.9 (Anchor) ×8 IMPLANT
ANCHOR KNTLS VENTIX 4.75 (Anchor) ×8 IMPLANT
BANDAGE ESMARK 6X9 LF (GAUZE/BANDAGES/DRESSINGS) ×2 IMPLANT
BIT DRILL JUGRKNT W/NDL BIT2.9 (DRILL) ×2 IMPLANT
BLADE AVERAGE 25MMX9MM (BLADE)
BLADE AVERAGE 25X9 (BLADE) IMPLANT
BLADE MICRO SAGITTAL (BLADE) ×4 IMPLANT
BLADE SURG 15 STRL LF DISP TIS (BLADE) ×8 IMPLANT
BLADE SURG 15 STRL SS (BLADE) ×8
BNDG COHESIVE 4X5 TAN STRL (GAUZE/BANDAGES/DRESSINGS) ×4 IMPLANT
BNDG COHESIVE 6X5 TAN STRL LF (GAUZE/BANDAGES/DRESSINGS) ×4 IMPLANT
BNDG ELASTIC 4X5.8 VLCR STR LF (GAUZE/BANDAGES/DRESSINGS) IMPLANT
BNDG ESMARK 6X9 LF (GAUZE/BANDAGES/DRESSINGS) ×4
BOOT STEPPER DURA LG (SOFTGOODS) IMPLANT
BOOT STEPPER DURA MED (SOFTGOODS) IMPLANT
BOOT STEPPER DURA XLG (SOFTGOODS) IMPLANT
CANISTER SUCT 1200ML W/VALVE (MISCELLANEOUS) ×4 IMPLANT
CHLORAPREP W/TINT 26 (MISCELLANEOUS) ×4 IMPLANT
COVER BACK TABLE REUSABLE LG (DRAPES) ×4 IMPLANT
COVER WAND RF STERILE (DRAPES) IMPLANT
CUFF TOURN SGL QUICK 34 (TOURNIQUET CUFF)
CUFF TRNQT CYL 34X4.125X (TOURNIQUET CUFF) IMPLANT
DRAPE EXTREMITY T 121X128X90 (DISPOSABLE) ×4 IMPLANT
DRAPE HALF SHEET 70X43 (DRAPES) ×4 IMPLANT
DRAPE OEC MINIVIEW 54X84 (DRAPES) IMPLANT
DRAPE U-SHAPE 47X51 STRL (DRAPES) ×4 IMPLANT
DRILL JUGGERKNOT W/NDL BIT 2.9 (DRILL) ×4
DRSG MEPITEL 4X7.2 (GAUZE/BANDAGES/DRESSINGS) ×4 IMPLANT
DRSG PAD ABDOMINAL 8X10 ST (GAUZE/BANDAGES/DRESSINGS) ×12 IMPLANT
ELECT REM PT RETURN 9FT ADLT (ELECTROSURGICAL) ×4
ELECTRODE REM PT RTRN 9FT ADLT (ELECTROSURGICAL) ×2 IMPLANT
GAUZE SPONGE 4X4 12PLY STRL (GAUZE/BANDAGES/DRESSINGS) ×4 IMPLANT
GLOVE BIO SURGEON STRL SZ7 (GLOVE) ×4 IMPLANT
GLOVE BIO SURGEON STRL SZ8 (GLOVE) ×4 IMPLANT
GLOVE BIOGEL PI IND STRL 7.0 (GLOVE) ×2 IMPLANT
GLOVE BIOGEL PI IND STRL 7.5 (GLOVE) ×2 IMPLANT
GLOVE BIOGEL PI IND STRL 8 (GLOVE) ×4 IMPLANT
GLOVE BIOGEL PI INDICATOR 7.0 (GLOVE) ×2
GLOVE BIOGEL PI INDICATOR 7.5 (GLOVE) ×2
GLOVE BIOGEL PI INDICATOR 8 (GLOVE) ×4
GLOVE ECLIPSE 8.0 STRL XLNG CF (GLOVE) ×4 IMPLANT
GOWN STRL REUS W/ TWL LRG LVL3 (GOWN DISPOSABLE) IMPLANT
GOWN STRL REUS W/ TWL XL LVL3 (GOWN DISPOSABLE) ×6 IMPLANT
GOWN STRL REUS W/TWL LRG LVL3 (GOWN DISPOSABLE)
GOWN STRL REUS W/TWL XL LVL3 (GOWN DISPOSABLE) ×6
KIT BIO-TENODESIS 3X8 DISP (MISCELLANEOUS)
KIT INSRT BABSR STRL DISP BTN (MISCELLANEOUS) IMPLANT
NDL SAFETY ECLIPSE 18X1.5 (NEEDLE) IMPLANT
NDL SUT 6 .5 CRC .975X.05 MAYO (NEEDLE) IMPLANT
NEEDLE HYPO 18GX1.5 SHARP (NEEDLE)
NEEDLE HYPO 22GX1.5 SAFETY (NEEDLE) IMPLANT
NEEDLE HYPO 25X1 1.5 SAFETY (NEEDLE) IMPLANT
NEEDLE MAYO TAPER (NEEDLE)
NS IRRIG 1000ML POUR BTL (IV SOLUTION) ×4 IMPLANT
PACK BASIN DAY SURGERY FS (CUSTOM PROCEDURE TRAY) ×4 IMPLANT
PAD CAST 4YDX4 CTTN HI CHSV (CAST SUPPLIES) ×2 IMPLANT
PADDING CAST COTTON 4X4 STRL (CAST SUPPLIES) ×2
PADDING CAST COTTON 6X4 STRL (CAST SUPPLIES) ×4 IMPLANT
PENCIL BUTTON HOLSTER BLD 10FT (ELECTRODE) ×4 IMPLANT
SANITIZER HAND PURELL 535ML FO (MISCELLANEOUS) ×4 IMPLANT
SLEEVE SCD COMPRESS KNEE MED (MISCELLANEOUS) ×4 IMPLANT
SPLINT FAST PLASTER 5X30 (CAST SUPPLIES) ×40
SPLINT PLASTER CAST FAST 5X30 (CAST SUPPLIES) ×40 IMPLANT
SPONGE LAP 18X18 RF (DISPOSABLE) ×4 IMPLANT
STAPLER VISISTAT 35W (STAPLE) IMPLANT
STOCKINETTE 6  STRL (DRAPES) ×2
STOCKINETTE 6 STRL (DRAPES) ×2 IMPLANT
SUCTION FRAZIER HANDLE 10FR (MISCELLANEOUS) ×2
SUCTION TUBE FRAZIER 10FR DISP (MISCELLANEOUS) ×2 IMPLANT
SUT ETHIBOND 2 OS 4 DA (SUTURE) IMPLANT
SUT ETHIBOND 3-0 V-5 (SUTURE) IMPLANT
SUT ETHILON 3 0 PS 1 (SUTURE) ×8 IMPLANT
SUT FIBERWIRE #2 38 T-5 BLUE (SUTURE)
SUT MNCRL AB 3-0 PS2 18 (SUTURE) ×4 IMPLANT
SUT VIC AB 0 CT1 27 (SUTURE)
SUT VIC AB 0 CT1 27XBRD ANBCTR (SUTURE) IMPLANT
SUT VIC AB 0 SH 27 (SUTURE) ×4 IMPLANT
SUT VIC AB 2-0 SH 27 (SUTURE)
SUT VIC AB 2-0 SH 27XBRD (SUTURE) IMPLANT
SUTURE FIBERWR #2 38 T-5 BLUE (SUTURE) IMPLANT
SYR BULB 3OZ (MISCELLANEOUS) ×4 IMPLANT
SYR CONTROL 10ML LL (SYRINGE) IMPLANT
TOWEL GREEN STERILE FF (TOWEL DISPOSABLE) ×8 IMPLANT
TUBE CONNECTING 20'X1/4 (TUBING)
TUBE CONNECTING 20X1/4 (TUBING) IMPLANT
UNDERPAD 30X30 (UNDERPADS AND DIAPERS) ×4 IMPLANT
YANKAUER SUCT BULB TIP NO VENT (SUCTIONS) IMPLANT

## 2018-12-21 NOTE — Anesthesia Preprocedure Evaluation (Addendum)
Anesthesia Evaluation  Patient identified by MRN, date of birth, ID band Patient awake    Reviewed: Allergy & Precautions, NPO status , Patient's Chart, lab work & pertinent test results  Airway Mallampati: II  TM Distance: >3 FB Neck ROM: Full    Dental  (+) Teeth Intact, Dental Advisory Given   Pulmonary asthma , sleep apnea ,    Pulmonary exam normal breath sounds clear to auscultation       Cardiovascular hypertension, Pt. on medications +CHF  Normal cardiovascular exam+ dysrhythmias Atrial Fibrillation  Rhythm:Regular Rate:Normal     Neuro/Psych negative neurological ROS  negative psych ROS   GI/Hepatic negative GI ROS, Neg liver ROS,   Endo/Other  diabetes, Type 2, Oral Hypoglycemic AgentsMorbid obesity  Renal/GU negative Renal ROS     Musculoskeletal  (+) Arthritis , Fibromyalgia -Posterior calcaneal exostosis   Abdominal   Peds  Hematology  (+) Blood dyscrasia (Eliquis), ,   Anesthesia Other Findings Day of surgery medications reviewed with the patient.  Reproductive/Obstetrics                            Anesthesia Physical Anesthesia Plan  ASA: III  Anesthesia Plan: General   Post-op Pain Management:  Regional for Post-op pain   Induction: Intravenous  PONV Risk Score and Plan: 3 and Midazolam, Ondansetron and Dexamethasone  Airway Management Planned: LMA and Oral ETT  Additional Equipment:   Intra-op Plan:   Post-operative Plan: Extubation in OR  Informed Consent: I have reviewed the patients History and Physical, chart, labs and discussed the procedure including the risks, benefits and alternatives for the proposed anesthesia with the patient or authorized representative who has indicated his/her understanding and acceptance.     Dental advisory given  Plan Discussed with: CRNA  Anesthesia Plan Comments:         Anesthesia Quick Evaluation

## 2018-12-21 NOTE — Anesthesia Procedure Notes (Signed)
Anesthesia Regional Block: Popliteal block   Pre-Anesthetic Checklist: ,, timeout performed, Correct Patient, Correct Site, Correct Laterality, Correct Procedure, Correct Position, site marked, Risks and benefits discussed,  Surgical consent,  Pre-op evaluation,  At surgeon's request and post-op pain management  Laterality: Left  Prep: chloraprep       Needles:  Injection technique: Single-shot  Needle Type: Echogenic Needle     Needle Length: 9cm  Needle Gauge: 21     Additional Needles:   Procedures:,,,, ultrasound used (permanent image in chart),,,,  Narrative:  Start time: 12/21/2018 9:43 AM End time: 12/21/2018 9:53 AM Injection made incrementally with aspirations every 5 mL.  Performed by: Personally  Anesthesiologist: Catalina Gravel, MD  Additional Notes: No pain on injection. No increased resistance to injection. Injection made in 5cc increments.  Good needle visualization.  Patient tolerated procedure well.

## 2018-12-21 NOTE — Discharge Instructions (Addendum)
NO TYLENOL BEFORE 4 PM TODAY!    [name], MD [facility]  Please read the following information regarding your care after surgery.  Medications  You only need a prescription for the narcotic pain medicine (ex. oxycodone, Percocet, Norco).  All of the other medicines listed below are available over the counter. X Aleve 2 pills twice a day for the first 3 days after surgery. X acetominophen (Tylenol) 650 mg every 4-6 hours as you need for minor to moderate pain X oxycodone as prescribed for severe pain X zofran as prescribed for post-op nausea  Narcotic pain medicine (ex. oxycodone, Percocet, Vicodin) will cause constipation.  To prevent this problem, take the following medicines while you are taking any pain medicine. X docusate sodium (Colace) 100 mg twice a day X senna (Senokot) 2 tablets twice a day  X To help prevent blood clots, resume Eliquis tomorrow.  You should also get up every hour while you are awake to move around.    Weight Bearing X Do not bear any weight on the operated leg or foot.  Cast / Splint / Dressing X Keep your splint, cast or dressing clean and dry.  Dont put anything (coat hanger, pencil, etc) down inside of it.  If it gets damp, use a hair dryer on the cool setting to dry it.  If it gets soaked, call the office to schedule an appointment for a cast change.  After your dressing, cast or splint is removed; you may shower, but do not soak or scrub the wound.  Allow the water to run over it, and then gently pat it dry.  Swelling It is normal for you to have swelling where you had surgery.  To reduce swelling and pain, keep your toes above your nose for at least 3 days after surgery.  It may be necessary to keep your foot or leg elevated for several weeks.  If it hurts, it should be elevated.  Follow Up Call my office at [phone] when you are discharged from the hospital or surgery center to schedule an appointment to be seen two weeks after  surgery.  Call my office at [phone] if you develop a fever >101.5 F, nausea, vomiting, bleeding from the surgical site or severe pain.        Regional Anesthesia Blocks  1. Numbness or the inability to move the "blocked" extremity may last from 3-48 hours after placement. The length of time depends on the medication injected and your individual response to the medication. If the numbness is not going away after 48 hours, call your surgeon.  2. The extremity that is blocked will need to be protected until the numbness is gone and the  Strength has returned. Because you cannot feel it, you will need to take extra care to avoid injury. Because it may be weak, you may have difficulty moving it or using it. You may not know what position it is in without looking at it while the block is in effect.  3. For blocks in the legs and feet, returning to weight bearing and walking needs to be done carefully. You will need to wait until the numbness is entirely gone and the strength has returned. You should be able to move your leg and foot normally before you try and bear weight or walk. You will need someone to be with you when you first try to ensure you do not fall and possibly risk injury.  4. Bruising and tenderness at the needle  site are common side effects and will resolve in a few days.  5. Persistent numbness or new problems with movement should be communicated to the surgeon or the [place] [phone] [place] [phone]).       Post Anesthesia Home Care Instructions  Activity: Get plenty of rest for the remainder of the day. A responsible individual must stay with you for 24 hours following the procedure.  For the next 24 hours, DO NOT: -Drive a car -Paediatric nurse -Drink alcoholic beverages -Take any medication unless instructed by your physician -Make any legal decisions or sign important papers.  Meals: Start with liquid foods such  as gelatin or soup. Progress to regular foods as tolerated. Avoid greasy, spicy, heavy foods. If nausea and/or vomiting occur, drink only clear liquids until the nausea and/or vomiting subsides. Call your physician if vomiting continues.  Special Instructions/Symptoms: Your throat may feel dry or sore from the anesthesia or the breathing tube placed in your throat during surgery. If this causes discomfort, gargle with warm salt water. The discomfort should disappear within 24 hours.  If you had a scopolamine patch placed behind your ear for the management of post- operative nausea and/or vomiting:  1. The medication in the patch is effective for 72 hours, after which it should be removed.  Wrap patch in a tissue and discard in the trash. Wash hands thoroughly with soap and water. 2. You may remove the patch earlier than 72 hours if you experience unpleasant side effects which may include dry mouth, dizziness or visual disturbances. 3. Avoid touching the patch. Wash your hands with soap and water after contact with the patch.

## 2018-12-21 NOTE — Transfer of Care (Signed)
Immediate Anesthesia Transfer of Care Note  Patient: Christie Atkinson  Procedure(s) Performed: Left Gastroc Recession (Left Leg Lower) Left Achilles Tendon Debridement and Reconstruction; Excision of Haglund Deformity (Left Foot)  Patient Location: PACU  Anesthesia Type:General and Regional  Level of Consciousness: awake, alert  and oriented  Airway & Oxygen Therapy: Patient Spontanous Breathing and Patient connected to face mask oxygen  Post-op Assessment: Report given to RN and Post -op Vital signs reviewed and stable  Post vital signs: Reviewed and stable  Last Vitals:  Vitals Value Taken Time  BP    Temp    Pulse    Resp    SpO2      Last Pain:  Vitals:   12/21/18 0922  TempSrc: Oral  PainSc: 2          Complications: No apparent anesthesia complications

## 2018-12-21 NOTE — Anesthesia Procedure Notes (Signed)
Procedure Name: Intubation Date/Time: 12/21/2018 10:42 AM Performed by: Signe Colt, CRNA Pre-anesthesia Checklist: Patient identified, Emergency Drugs available, Suction available and Patient being monitored Patient Re-evaluated:Patient Re-evaluated prior to induction Oxygen Delivery Method: Circle system utilized Preoxygenation: Pre-oxygenation with 100% oxygen Induction Type: IV induction Ventilation: Mask ventilation without difficulty Laryngoscope Size: Mac and 3 Grade View: Grade II Tube type: Oral Tube size: 7.0 mm Number of attempts: 1 Airway Equipment and Method: Stylet and Oral airway Placement Confirmation: ETT inserted through vocal cords under direct vision,  positive ETCO2 and breath sounds checked- equal and bilateral Secured at: 21 cm Tube secured with: Tape Dental Injury: Teeth and Oropharynx as per pre-operative assessment

## 2018-12-21 NOTE — Op Note (Signed)
12/21/2018  11:54 AM  PATIENT:  Christie Atkinson  63 y.o. female  PRE-OPERATIVE DIAGNOSIS:  Left foot insertional Achilles tendonitis, short Achilles tendon and Haglund deformity (Posterior calcaneal exostosis)  POST-OPERATIVE DIAGNOSIS:  Left foot insertional Achilles tendonitis, short Achilles tendon and Haglund deformity (Posterior calcaneal exostosis)  Procedure(s): 1.  Left Gastroc Recession 2.  Excision of Haglund Deformity 3.  Left Achilles Tendon Debridement and Reconstruction;   SURGEON:  [name], MD  ASSISTANT: Mechele Claude, PA-C  ANESTHESIA:   General, regional  EBL:  minimal   TOURNIQUET:   Total Tourniquet Time Documented: Thigh (Left) - 54 minutes Total: Thigh (Left) - 54 minutes  COMPLICATIONS:  None apparent  DISPOSITION:  Extubated, awake and stable to recovery.  INDICATION FOR PROCEDURE: The patient is a 63 year old female with a past medical history significant for diabetes.  She has a long history of left heel pain due to Achilles tendinopathy.  She also has a short Achilles tendon and a prominent Haglund deformity.  She has failed nonoperative treatment to date including activity modification, oral anti-inflammatories, physical therapy and shoewear modification.  She presents today for surgical correction of these painful left heel conditions.  The risks and benefits of the alternative treatment options have been discussed in detail.  The patient wishes to proceed with surgery and specifically understands risks of bleeding, infection, nerve damage, blood clots, need for additional surgery, amputation and death.  PROCEDURE IN DETAIL:  After pre operative consent was obtained, and the correct operative site was identified, the patient was brought to the operating room supine on a stretcher.  Preoperative antibiotics were administered.  General anesthesia was administered.  A surgical timeout was taken.  The left lower extremity was exsanguinated and the  tourniquet was inflated to 350 mmHg.  The patient was then turned into the prone position on the operating table with all bony prominences padded well.  The left lower extremity was prepped and draped in standard sterile fashion.  A longitudinal incision was made over the posterior calf.  Dissection was carried down to the subcutaneous tissues.  Care was taken to protect the sural nerve and lesser saphenous vein.  Superficial fascia was incised.  The gastrocnemius tendon was identified.  It was divided under direct vision in its entirety.  The wound was irrigated and closed with Monocryl and nylon.  Attention was turned to the posterior heel where a longitudinal incision was made in the midline.  Dissection was carried down through the subcutaneous tissues and peritenon creating full-thickness flaps medially and laterally.  The tendon was then split longitudinally and released from its insertion medially and laterally.  The tendon was debrided of all degenerated portions with a scalpel.  The oscillating saw was used to resect the Haglund deformity and insertional enthesophytes distally.  The Achilles tendon was then repaired to the healthy cut surface of bone using 4 suture anchors and an hourglass pattern of nonabsorbable suture.  The ankle could then be dorsiflexed to neutral with no gapping at the tendon repair site.  The wound was irrigated copiously.  The longitudinal split in the tendon was repaired with 0 Vicryl.  Vancomycin powder was sprinkled in the wound.  The peritenon was repaired with inverted sutures of 3-0 Monocryl.  The skin incision was closed with horizontal mattress sutures of 3-0 nylon.  Sterile dressings were applied followed by a well-padded short leg splint.  The tourniquet was released after application of the dressings.  The patient was awakened from anesthesia  and transported to the recovery room in stable condition.  FOLLOW UP PLAN: Nonweightbearing on the left lower extremity.   Follow-up in the office in 2 weeks for suture removal and conversion to a cam boot with 2 heel lifts.  She will resume Eliquis on postop day 1.    Mechele Claude PA-C was present and scrubbed for the duration of the operative case. His assistance was essential in positioning the patient, prepping and draping, gaining and maintaining exposure, performing the operation, closing and dressing the wounds and applying the splint.

## 2018-12-21 NOTE — Anesthesia Procedure Notes (Signed)
Anesthesia Regional Block: Adductor canal block   Pre-Anesthetic Checklist: ,, timeout performed, Correct Patient, Correct Site, Correct Laterality, Correct Procedure, Correct Position, site marked, Risks and benefits discussed,  Surgical consent,  Pre-op evaluation,  At surgeon's request and post-op pain management  Laterality: Left  Prep: chloraprep       Needles:  Injection technique: Single-shot  Needle Type: Echogenic Needle     Needle Length: 9cm  Needle Gauge: 21     Additional Needles:   Procedures:,,,, ultrasound used (permanent image in chart),,,,  Narrative:  Start time: 12/21/2018 9:53 AM End time: 12/21/2018 10:00 AM Injection made incrementally with aspirations every 5 mL.  Performed by: Personally  Anesthesiologist: Catalina Gravel, MD  Additional Notes: No pain on injection. No increased resistance to injection. Injection made in 5cc increments.  Good needle visualization.  Patient tolerated procedure well.

## 2018-12-21 NOTE — Progress Notes (Signed)
Assisted Dr. Gifford Shave with left, ultrasound guided, popliteal and adductor canal blocks. Side rails up, monitors on throughout procedure. See vital signs in flow sheet. Tolerated Procedure well.

## 2018-12-21 NOTE — Anesthesia Postprocedure Evaluation (Signed)
Anesthesia Post Note  Patient: Christie Atkinson  Procedure(s) Performed: Left Gastroc Recession (Left Leg Lower) Left Achilles Tendon Debridement and Reconstruction; Excision of Haglund Deformity (Left Foot)     Patient location during evaluation: PACU Anesthesia Type: General Level of consciousness: awake and alert Pain management: pain level controlled Vital Signs Assessment: post-procedure vital signs reviewed and stable Respiratory status: spontaneous breathing, nonlabored ventilation, respiratory function stable and patient connected to nasal cannula oxygen Cardiovascular status: blood pressure returned to baseline and stable Postop Assessment: no apparent nausea or vomiting Anesthetic complications: no    Last Vitals:  Vitals:   12/21/18 1245 12/21/18 1308  BP: 108/74 106/72  Pulse: 79 88  Resp: 10 16  Temp:  36.5 C  SpO2: 95% 96%    Last Pain:  Vitals:   12/21/18 1308  TempSrc:   PainSc: 0-No pain                 Catalina Gravel

## 2018-12-21 NOTE — H&P (Signed)
Christie Atkinson is an 63 y.o. female.   Chief Complaint: Left heel pain HPI: The patient is a 63 year old female with past medical history significant for diabetes.  She has a long history of left heel pain due to Achilles tendinopathy.  She has a short Achilles tendon and a prominent Haglund deformity as well.  She has failed nonoperative treatment to date including activity modification, oral anti-inflammatories, physical therapy and shoewear modification.  She presents now for operative treatment of these painful and limiting conditions.  Past Medical History:  Diagnosis Date  . Asthma   . Atrial fibrillation ([place])   . Back pain   . Diastolic dysfunction   . Fibromyalgia   . Gestational diabetes   . Hypertension   . Knee pain   . Osteoarthritis   . Persistent atrial fibrillation   . Sleep apnea    wears oral appliance  . Visit for monitoring Tikosyn therapy 02/07/2018    Past Surgical History:  Procedure Laterality Date  . ABDOMINAL HYSTERECTOMY    . ABLATION OF DYSRHYTHMIC FOCUS  03/09/2018  . APPENDECTOMY    . ATRIAL FIBRILLATION ABLATION N/A 03/09/2018   Procedure: ATRIAL FIBRILLATION ABLATION;  Surgeon: [name], MD;  Location: [place] [facility];  Service: Cardiovascular;  Laterality: N/A;  . CARDIOVERSION N/A 07/04/2017   Procedure: CARDIOVERSION;  Surgeon: [name], MD;  Location: [facility] ENDOSCOPY;  Service: Cardiovascular;  Laterality: N/A;  . CARDIOVERSION N/A 09/06/2017   Procedure: CARDIOVERSION;  Surgeon: [name], MD;  Location: [facility] ENDOSCOPY;  Service: Cardiovascular;  Laterality: N/A;  . CHOLECYSTECTOMY N/A 02/24/2015   Procedure: LAPAROSCOPIC CHOLECYSTECTOMY;  Surgeon: [name], MD;  Location: [place];  Service: General;  Laterality: N/A;  . RIGHT/LEFT HEART CATH AND CORONARY ANGIOGRAPHY N/A 08/09/2017   Procedure: RIGHT/LEFT HEART CATH AND CORONARY ANGIOGRAPHY;  Surgeon: [name], MD;  Location: [place] [facility];  Service: Cardiovascular;   Laterality: N/A;  . TEE WITHOUT CARDIOVERSION N/A 07/04/2017   Procedure: TRANSESOPHAGEAL ECHOCARDIOGRAM (TEE);  Surgeon: [name], MD;  Location: [facility] ENDOSCOPY;  Service: Cardiovascular;  Laterality: N/A;  . TEE WITHOUT CARDIOVERSION N/A 03/09/2018   Procedure: TRANSESOPHAGEAL ECHOCARDIOGRAM (TEE);  Surgeon: [name], MD;  Location: [facility] ENDOSCOPY;  Service: Cardiovascular;  Laterality: N/A;    Family History  Problem Relation Age of Onset  . Heart disease Mother   . Hypertension Mother   . Hyperlipidemia Mother   . Diabetes Mother   . Sudden death Mother   . Thyroid disease Mother   . Obesity Mother   . Heart disease Father   . Heart attack Father   . Hypertension Father   . Hyperlipidemia Father   . Diabetes Father    Social History:  reports that she has never smoked. She has never used smokeless tobacco. She reports current alcohol use. She reports that she does not use drugs.  Allergies:  Allergies  Allergen Reactions  . Hydrocodone-Acetaminophen Nausea And Vomiting  . Other Other (See Comments)    Metals Polyester - including hospital gowns and sheets - allergic contact dermatitis  . Sulfur Hives    Medications Prior to Admission  Medication Sig Dispense Refill  . acetaminophen (TYLENOL) 325 MG tablet Take 325 mg by mouth every 6 (six) hours as needed for moderate pain or headache.    [name] apixaban (ELIQUIS) 5 MG TABS tablet Take 1 tablet (5 mg total) by mouth 2 (two) times daily. 60 tablet 6  . diltiazem (CARDIZEM CD)  120 MG 24 hr capsule Take 1 capsule (120 mg total) by mouth 2 (two) times daily. 180 capsule 2  . dofetilide (TIKOSYN) 125 MCG capsule Take 1 capsule (125 mcg total) by mouth 2 (two) times daily. 180 capsule 3  . furosemide (LASIX) 40 MG tablet Take 40 mg by mouth.    . magnesium gluconate (MAGONATE) 30 MG tablet Take 1 tablet (30 mg total) by mouth daily. 30 tablet 0  . metFORMIN (GLUCOPHAGE) 500 MG tablet Take 1 tablet (500 mg total) by mouth  daily with breakfast. 30 tablet 0  . Multiple Vitamin (MULTIVITAMIN WITH MINERALS) TABS tablet Take 1 tablet by mouth daily.     . Multiple Vitamins-Minerals (WOMENS MULTIVITAMIN) TABS Take 1 tablet by mouth daily. 90 tablet 0  . potassium chloride SA (K-DUR,KLOR-CON) 20 MEQ tablet Take 1 tablet (20 mEq total) by mouth daily. Take an extra 1/2 tablet by mouth on days when you take the extra fluid pill (furosemide/Lasix).    . Vitamin D, Ergocalciferol, (DRISDOL) 1.25 MG (50000 UT) CAPS capsule Take 1 capsule (50,000 Units total) by mouth every 7 (seven) days. TAKE 1 CAPSULE BY MOUTH EVERY 7 DAYS 12 capsule 0    Results for orders placed or performed during the hospital encounter of 2018-12-24 (from the past 48 hour(s))  Glucose, capillary     Status: Abnormal   Collection Time: 12-24-2018  9:23 AM  Result Value Ref Range   Glucose-Capillary 114 (H) 70 - 99 mg/dL   No results found.  ROS no recent fever, chills, nausea, vomiting or changes in her appetite  Blood pressure 108/70, pulse 89, temperature 97.7 F (36.5 C), temperature source Oral, resp. rate 11, height 5\' 5"  (1.651 m), weight 107.3 kg, SpO2 98 %. Physical Exam  Well-nourished well-developed woman in no apparent distress.  Alert and oriented x4.  Mood and affect are normal.  Extraocular motions are intact.  Respirations are unlabored.  Gait is normal.  Left foot is tender to palpation at the insertion of the Achilles.  Skin is healthy and intact.  Pulses are palpable.  No lymphadenopathy.  Sensibility to light touch is intact in the sural nerve distribution.  5 out of 5 strength in plantarflexion.  Assessment/Plan Left foot insertional Achilles tendinitis, short Achilles tendon and Haglund deformity -to the operating room today for gastrocnemius recession, excision of the Haglund deformity and debridement and reconstruction of the Achilles tendon.  The risks and benefits of the alternative treatment options have been discussed in detail.   The patient wishes to proceed with surgery and specifically understands risks of bleeding, infection, nerve damage, blood clots, need for additional surgery, amputation and death.   [name], MD 12-24-2018, 10:26 AM

## 2018-12-22 ENCOUNTER — Telehealth (HOSPITAL_COMMUNITY): Payer: Self-pay | Admitting: *Deleted

## 2018-12-22 ENCOUNTER — Encounter (HOSPITAL_BASED_OUTPATIENT_CLINIC_OR_DEPARTMENT_OTHER): Payer: Self-pay | Admitting: Orthopedic Surgery

## 2018-12-22 NOTE — Telephone Encounter (Signed)
Returned call to patient regarding HR over 100 but her cuff and oximeter is not indicating afib.  She is under more exertional stress at the moment since she has had a foot procedure yesterday. She stated that they should have sent [name] a copy of the EKG taken from the day of the procedure.  Pt was advised to continue to monitor her rates and call Monday with an update.

## 2018-12-25 ENCOUNTER — Telehealth (INDEPENDENT_AMBULATORY_CARE_PROVIDER_SITE_OTHER): Payer: Self-pay

## 2018-12-25 NOTE — Telephone Encounter (Signed)
[facility] in [place] called,  They are only able to get the magnesium gluconate in a 27mg  tablet instead of 30mg .  Please advise

## 2018-12-27 MED ORDER — MAGNESIUM GLUCONATE 500 (27 MG) MG PO TABS: 1.0000 | ORAL_TABLET | Freq: Every day | ORAL | 0 refills | Status: DC

## 2018-12-27 NOTE — Telephone Encounter (Signed)
That's fine. Thanks!

## 2018-12-28 ENCOUNTER — Telehealth: Payer: Self-pay

## 2018-12-28 NOTE — Telephone Encounter (Signed)
-----   Message from Iona Hansen, [place] sent at 12/25/2018  8:42 AM EDT ----- Pt called reporting that she wants a call back from [name] because she feels her follow up care from her foot surgery has been deficient and this is making her go into Afib.  She was offered a follow up appt for the afib in our clinic and declined.  She states that we dropped the ball and has already spoken to Roderic Palau, NP and now wants to speak directly to [name].    Concepcion Living, CMA

## 2018-12-28 NOTE — Telephone Encounter (Signed)
Call placed to Pt.  Pt is angry that our office did not prepare her for her foot surgery.  She is feels that we should have advised her what DME she would need after her surgery and that we should have prepared her that she may afib after.  Attempted to explain to Pt that her foot surgeon or PCP would have been responsible for arranging for any DME needs.    Pt does not understand what this nurse was attempting to tell her.  She continued to yell at this nurse and talk over.    Attempted several times to schedule virtual visit with [name] to discuss her concerns.  Pt states she does not feel she needs to make an appointment.  She wants [name] to call her.  Pt continued to yell at this nurse.  Attempted to complete conversation as Pt was not understanding/listening and continued to yell at this nurse.  Finally had to discontinue phone call.  Advised DC of conversation.  Will route to [name] for review.

## 2019-01-07 NOTE — Telephone Encounter (Signed)
I last saw her in January.  I was not aware of her recent foot surgery.  She was last seen in the AF clinic.  I am not sure how I can help regarding DME related to her foot surgery.  I would advise that she follow-up with primary care/ her foot surgeon with these concerns (as our nurse has already advised the patient).  If she has cardiology concerns, I am happy to assist.

## 2019-01-10 ENCOUNTER — Other Ambulatory Visit: Payer: Self-pay

## 2019-01-10 ENCOUNTER — Telehealth (HOSPITAL_COMMUNITY): Payer: Self-pay | Admitting: *Deleted

## 2019-01-10 ENCOUNTER — Encounter (INDEPENDENT_AMBULATORY_CARE_PROVIDER_SITE_OTHER): Payer: Self-pay | Admitting: Family Medicine

## 2019-01-10 ENCOUNTER — Ambulatory Visit (INDEPENDENT_AMBULATORY_CARE_PROVIDER_SITE_OTHER): Payer: Medicare Other | Admitting: Family Medicine

## 2019-01-10 DIAGNOSIS — I4891 Unspecified atrial fibrillation: Secondary | ICD-10-CM

## 2019-01-10 DIAGNOSIS — Z6838 Body mass index (BMI) 38.0-38.9, adult: Secondary | ICD-10-CM | POA: Diagnosis not present

## 2019-01-10 DIAGNOSIS — R7303 Prediabetes: Secondary | ICD-10-CM | POA: Diagnosis not present

## 2019-01-10 MED ORDER — DILTIAZEM HCL ER COATED BEADS 120 MG PO CP24: ORAL_CAPSULE | ORAL | 2 refills | Status: DC

## 2019-01-10 NOTE — Progress Notes (Signed)
Office: [phone]  /  Fax: [phone] TeleHealth Visit:  Christie Atkinson has verbally consented to this TeleHealth visit today. The patient is located at home, the provider is located at the [employer] [facility] office. The participants in this visit include the listed provider and patient. Christie Atkinson was unable to use realtime audiovisual technology today and the telehealth visit was conducted via telephone (15 minutes).   HPI:   Chief Complaint: OBESITY Christie Atkinson is here to discuss her progress with her obesity treatment plan. She is on the Category 2 plan and is following her eating plan approximately 95 % of the time. She states she is exercising 0 minutes 0 times per week. [name] recently had surgery for heel spur and tendon repair. She does report she feels she's lost 1-2 lbs since her last appointment. She feels she is doing well following the plan even being post-op.  We were unable to weigh the patient today for this TeleHealth visit. She feels as if she has lost 1-2 lbs since her last visit. She has lost 15 lbs since starting treatment with Christie Atkinson.  Atrial Fibrillation Phil voices she has been going in and out of a-Fib. Reviewing notes, she called her Cardiology office and exchanged some words with the office. She states her blood pressure was 142/78.   Pre-Diabetes Christie Atkinson has a diagnosis of pre-diabetes based on her elevated Hgb A1c and was informed this puts her at greater risk of developing diabetes. Last Hgb A1c was on 09/21/2018 at 5.8. She denies GI side effects of metformin. She denies hypoglycemia.  ASSESSMENT AND PLAN:  Atrial fibrillation, unspecified type (HCC)  Prediabetes  Class 2 severe obesity with serious comorbidity and body mass index (BMI) of 38.0 to 38.9 in adult, unspecified obesity type [facility])  PLAN:  Atrial Fibrillation Christie Atkinson was encouraged to call and make an appointment with Dr. Joylene Grapes to discuss her medications and control.  Pre-Diabetes  Christie Atkinson will continue to work on weight loss, exercise, and decreasing simple carbohydrates in her diet to help decrease the risk of diabetes. We dicussed metformin including benefits and risks. She was informed that eating too many simple carbohydrates or too many calories at one sitting increases the likelihood of GI side effects. Christie Atkinson agrees to continue taking metformin, and we will repeat labs at her next in office appointment. Christie Atkinson agrees to follow up with our clinic in 2 weeks as directed to monitor her progress.  Obesity Christie Atkinson is currently in the action stage of change. As such, her goal is to continue with weight loss efforts She has agreed to follow the Category 2 plan Christie Atkinson has been instructed to work up to a goal of 150 minutes of combined cardio and strengthening exercise per week for weight loss and overall health benefits. We discussed the following Behavioral Modification Strategies today: increasing lean protein intake, increasing vegetables and work on meal planning and easy cooking plans, keeping healthy foods in the home, and planning for success   Christie Atkinson has agreed to follow up with our clinic in 2 weeks. She was informed of the importance of frequent follow up visits to maximize her success with intensive lifestyle modifications for her multiple health conditions.  ALLERGIES: Allergies  Allergen Reactions  . Hydrocodone-Acetaminophen Nausea And Vomiting  . Other Other (See Comments)    Metals Polyester - including hospital gowns and sheets - allergic contact dermatitis  . Sulfur Hives    MEDICATIONS: Current Outpatient Medications on File Prior to Visit  Medication Sig  Dispense Refill  . acetaminophen (TYLENOL) 325 MG tablet Take 325 mg by mouth every 6 (six) hours as needed for moderate pain or headache.    Christie Atkinson apixaban (ELIQUIS) 5 MG TABS tablet Take 1 tablet (5 mg total) by mouth 2 (two) times daily. 60 tablet 6  . docusate sodium (COLACE) 100 MG capsule  Take 1 capsule (100 mg total) by mouth 2 (two) times daily. While taking narcotic pain medicine. 30 capsule 0  . dofetilide (TIKOSYN) 125 MCG capsule Take 1 capsule (125 mcg total) by mouth 2 (two) times daily. 180 capsule 3  . furosemide (LASIX) 40 MG tablet Take 40 mg by mouth.    . Magnesium Gluconate 500 (27 Mg) MG TABS Take 1 tablet (500 mg total) by mouth daily. 30 tablet 0  . metFORMIN (GLUCOPHAGE) 500 MG tablet Take 1 tablet (500 mg total) by mouth daily with breakfast. 30 tablet 0  . Multiple Vitamin (MULTIVITAMIN WITH MINERALS) TABS tablet Take 1 tablet by mouth daily.     . Multiple Vitamins-Minerals (WOMENS MULTIVITAMIN) TABS Take 1 tablet by mouth daily. 90 tablet 0  . potassium chloride SA (K-DUR,KLOR-CON) 20 MEQ tablet Take 1 tablet (20 mEq total) by mouth daily. Take an extra 1/2 tablet by mouth on days when you take the extra fluid pill (furosemide/Lasix).    . Vitamin D, Ergocalciferol, (DRISDOL) 1.25 MG (50000 UT) CAPS capsule Take 1 capsule (50,000 Units total) by mouth every 7 (seven) days. TAKE 1 CAPSULE BY MOUTH EVERY 7 DAYS 12 capsule 0  . ondansetron (ZOFRAN) 4 MG tablet Take 1 tablet (4 mg total) by mouth daily as needed for nausea or vomiting. (Patient not taking: Reported on 01/10/2019) 30 tablet 0  . senna (SENOKOT) 8.6 MG TABS tablet Take 2 tablets (17.2 mg total) by mouth 2 (two) times daily. (Patient not taking: Reported on 01/10/2019) 30 tablet 0   No current facility-administered medications on file prior to visit.     PAST MEDICAL HISTORY: Past Medical History:  Diagnosis Date  . Asthma   . Atrial fibrillation ([place])   . Back pain   . Diastolic dysfunction   . Fibromyalgia   . Gestational diabetes   . Hypertension   . Knee pain   . Osteoarthritis   . Persistent atrial fibrillation   . Sleep apnea    wears oral appliance  . Visit for monitoring Tikosyn therapy 02/07/2018    PAST SURGICAL HISTORY: Past Surgical History:  Procedure Laterality Date  .  ABDOMINAL HYSTERECTOMY    . ABLATION OF DYSRHYTHMIC FOCUS  03/09/2018  . APPENDECTOMY    . ATRIAL FIBRILLATION ABLATION N/A 03/09/2018   Procedure: ATRIAL FIBRILLATION ABLATION;  Surgeon: [name], MD;  Location: [place] [facility] LAB;  Service: Cardiovascular;  Laterality: N/A;  . CARDIOVERSION N/A 07/04/2017   Procedure: CARDIOVERSION;  Surgeon: [name], MD;  Location: [facility] ENDOSCOPY;  Service: Cardiovascular;  Laterality: N/A;  . CARDIOVERSION N/A 09/06/2017   Procedure: CARDIOVERSION;  Surgeon: [name], MD;  Location: [facility] ENDOSCOPY;  Service: Cardiovascular;  Laterality: N/A;  . CHOLECYSTECTOMY N/A 02/24/2015   Procedure: LAPAROSCOPIC CHOLECYSTECTOMY;  Surgeon: [name], MD;  Location: [place];  Service: General;  Laterality: N/A;  . EXCISION HAGLUND'S DEFORMITY WITH ACHILLES TENDON REPAIR Left 12/21/2018   Procedure: Left Achilles Tendon Debridement and Reconstruction; Excision of Haglund Deformity;  Surgeon: [name], MD;  Location: [place];  Service: Orthopedics;  Laterality: Left;  Christie Atkinson GASTROCNEMIUS RECESSION Left 12/21/2018  Procedure: Left Gastroc Recession;  Surgeon: [name], MD;  Location: [place];  Service: Orthopedics;  Laterality: Left;  . RIGHT/LEFT HEART CATH AND CORONARY ANGIOGRAPHY N/A 08/09/2017   Procedure: RIGHT/LEFT HEART CATH AND CORONARY ANGIOGRAPHY;  Surgeon: [name], MD;  Location: [place] CV LAB;  Service: Cardiovascular;  Laterality: N/A;  . TEE WITHOUT CARDIOVERSION N/A 07/04/2017   Procedure: TRANSESOPHAGEAL ECHOCARDIOGRAM (TEE);  Surgeon: [name], MD;  Location: [facility] ENDOSCOPY;  Service: Cardiovascular;  Laterality: N/A;  . TEE WITHOUT CARDIOVERSION N/A 03/09/2018   Procedure: TRANSESOPHAGEAL ECHOCARDIOGRAM (TEE);  Surgeon: [name], MD;  Location: [facility] ENDOSCOPY;  Service: Cardiovascular;  Laterality: N/A;    SOCIAL HISTORY: Social History   Tobacco Use  . Smoking status: Never Smoker   . Smokeless tobacco: Never Used  Substance Use Topics  . Alcohol use: Yes    Comment: little  . Drug use: No    FAMILY HISTORY: Family History  Problem Relation Age of Onset  . Heart disease Mother   . Hypertension Mother   . Hyperlipidemia Mother   . Diabetes Mother   . Sudden death Mother   . Thyroid disease Mother   . Obesity Mother   . Heart disease Father   . Heart attack Father   . Hypertension Father   . Hyperlipidemia Father   . Diabetes Father     ROS: Review of Systems  Constitutional: Positive for weight loss.  Endo/Heme/Allergies:       Negative hypoglycemia    PHYSICAL EXAM: Pt in no acute distress  RECENT LABS AND TESTS: BMET    Component Value Date/Time   NA 138 12/14/2018 1500   NA 141 05/17/2018 1154   K 3.9 12/14/2018 1500   CL 104 12/14/2018 1500   CO2 25 12/14/2018 1500   GLUCOSE 93 12/14/2018 1500   BUN 21 12/14/2018 1500   BUN 16 05/17/2018 1154   CREATININE 0.87 12/14/2018 1500   CALCIUM 9.3 12/14/2018 1500   GFRNONAA >60 12/14/2018 1500   GFRAA >60 12/14/2018 1500   Lab Results  Component Value Date   HGBA1C 5.8 (H) 09/21/2018   HGBA1C 6.0 (H) 05/17/2018   HGBA1C 5.8 (H) 11/02/2017   HGBA1C 5.8 (H) 06/26/2017   Lab Results  Component Value Date   INSULIN 24.4 09/21/2018   INSULIN 16.1 05/17/2018   INSULIN 8.5 11/02/2017   CBC    Component Value Date/Time   WBC 8.5 03/08/2018 1442   WBC 7.4 08/31/2017 0930   RBC 4.46 03/08/2018 1442   RBC 4.63 08/31/2017 0930   HGB 14.0 03/08/2018 1442   HCT 42.2 03/08/2018 1442   PLT 256 03/08/2018 1442   MCV 95 03/08/2018 1442   MCH 31.4 03/08/2018 1442   MCH 30.2 08/31/2017 0930   MCHC 33.2 03/08/2018 1442   MCHC 33.2 08/31/2017 0930   RDW 13.4 03/08/2018 1442   LYMPHSABS 2.5 03/08/2018 1442   MONOABS 1.2 (H) 02/23/2015 0619   EOSABS 0.1 03/08/2018 1442   BASOSABS 0.0 03/08/2018 1442   Iron/TIBC/Ferritin/ %Sat No results found for: IRON, TIBC, FERRITIN, IRONPCTSAT Lipid  Panel     Component Value Date/Time   CHOL 170 09/21/2018 0951   TRIG 131 09/21/2018 0951   HDL 32 (L) 09/21/2018 0951   CHOLHDL 5.4 06/26/2017 0517   VLDL 15 06/26/2017 0517   LDLCALC 112 (H) 09/21/2018 0951   Hepatic Function Panel     Component Value Date/Time   PROT 7.5 05/17/2018 1154  ALBUMIN 4.2 05/17/2018 1154   AST 24 05/17/2018 1154   ALT 15 05/17/2018 1154   ALKPHOS 86 05/17/2018 1154   BILITOT 0.6 05/17/2018 1154      Component Value Date/Time   TSH 4.610 (H) 09/21/2018 0951   TSH 3.010 11/02/2017 1254   TSH 4.410 06/24/2017 1046      I, Trixie Dredge, am acting as transcriptionist for Ilene Qua, MD   I have reviewed the above documentation for accuracy and completeness, and I agree with the above. - Ilene Qua, MD

## 2019-01-10 NOTE — Telephone Encounter (Signed)
Patient called in stating she is having increased breakthrough afib. BP has been running higher in the 140-150/80s. HR in the 80s. Discussed with Christie Palau NP will increase cardizem to 240mg  in the AM and 120mg  in the PM. Pt will do this for 1 week then call with report of how she is tolerating. Pt in agreement with this plan.

## 2019-01-11 ENCOUNTER — Other Ambulatory Visit (HOSPITAL_COMMUNITY): Payer: Self-pay | Admitting: *Deleted

## 2019-01-11 MED ORDER — DILTIAZEM HCL ER COATED BEADS 120 MG PO CP24: ORAL_CAPSULE | ORAL | 2 refills | Status: DC

## 2019-01-24 ENCOUNTER — Ambulatory Visit (INDEPENDENT_AMBULATORY_CARE_PROVIDER_SITE_OTHER): Payer: Medicare Other | Admitting: Family Medicine

## 2019-01-24 ENCOUNTER — Encounter (INDEPENDENT_AMBULATORY_CARE_PROVIDER_SITE_OTHER): Payer: Self-pay | Admitting: Family Medicine

## 2019-01-24 ENCOUNTER — Other Ambulatory Visit: Payer: Self-pay

## 2019-01-24 VITALS — BP 125/72 | HR 73 | Temp 97.6°F | Ht 65.0 in | Wt 236.0 lb

## 2019-01-24 DIAGNOSIS — I482 Chronic atrial fibrillation, unspecified: Secondary | ICD-10-CM

## 2019-01-24 DIAGNOSIS — E559 Vitamin D deficiency, unspecified: Secondary | ICD-10-CM

## 2019-01-24 DIAGNOSIS — R7303 Prediabetes: Secondary | ICD-10-CM | POA: Diagnosis not present

## 2019-01-24 DIAGNOSIS — I10 Essential (primary) hypertension: Secondary | ICD-10-CM | POA: Diagnosis not present

## 2019-01-24 DIAGNOSIS — R0602 Shortness of breath: Secondary | ICD-10-CM | POA: Diagnosis not present

## 2019-01-24 DIAGNOSIS — Z6839 Body mass index (BMI) 39.0-39.9, adult: Secondary | ICD-10-CM | POA: Diagnosis not present

## 2019-01-25 LAB — COMPREHENSIVE METABOLIC PANEL
ALT: 11 IU/L (ref 0–32)
AST: 20 IU/L (ref 0–40)
Albumin/Globulin Ratio: 1.4 (ref 1.2–2.2)
Albumin: 4.3 g/dL (ref 3.8–4.8)
Alkaline Phosphatase: 88 IU/L (ref 39–117)
BUN/Creatinine Ratio: 20 (ref 12–28)
BUN: 16 mg/dL (ref 8–27)
Bilirubin Total: 0.3 mg/dL (ref 0.0–1.2)
CO2: 21 mmol/L (ref 20–29)
Calcium: 9.6 mg/dL (ref 8.7–10.3)
Chloride: 103 mmol/L (ref 96–106)
Creatinine, Ser: 0.8 mg/dL (ref 0.57–1.00)
GFR calc Af Amer: 91 mL/min/{1.73_m2} (ref >59)
GFR calc non Af Amer: 79 mL/min/{1.73_m2} (ref >59)
Globulin, Total: 3.1 g/dL (ref 1.5–4.5)
Glucose: 99 mg/dL (ref 65–99)
Potassium: 4.5 mmol/L (ref 3.5–5.2)
Sodium: 141 mmol/L (ref 134–144)
Total Protein: 7.4 g/dL (ref 6.0–8.5)

## 2019-01-25 LAB — HEMOGLOBIN A1C
Est. average glucose Bld gHb Est-mCnc: 120 mg/dL
Hgb A1c MFr Bld: 5.8 % — ABNORMAL HIGH (ref 4.8–5.6)

## 2019-01-25 LAB — LIPID PANEL WITH LDL/HDL RATIO
Cholesterol, Total: 160 mg/dL (ref 100–199)
HDL: 31 mg/dL — ABNORMAL LOW (ref >39)
LDL Calculated: 94 mg/dL (ref 0–99)
LDl/HDL Ratio: 3 ratio (ref 0.0–3.2)
Triglycerides: 174 mg/dL — ABNORMAL HIGH (ref 0–149)
VLDL Cholesterol Cal: 35 mg/dL (ref 5–40)

## 2019-01-25 LAB — INSULIN, RANDOM: INSULIN: 20 u[IU]/mL (ref 2.6–24.9)

## 2019-01-25 LAB — VITAMIN D 25 HYDROXY (VIT D DEFICIENCY, FRACTURES): Vit D, 25-Hydroxy: 61 ng/mL (ref 30.0–100.0)

## 2019-01-25 MED ORDER — VITAMIN D (ERGOCALCIFEROL) 1.25 MG (50000 UNIT) PO CAPS: 50000.0000 [IU] | ORAL_CAPSULE | ORAL | 0 refills | Status: DC

## 2019-01-30 NOTE — Progress Notes (Signed)
Office: [phone]  /  Fax: [phone]   HPI:   Chief Complaint: OBESITY Christie Atkinson is here to discuss her progress with her obesity treatment plan. She is on the Category 2 plan and is following her eating plan approximately 90 % of the time. She states she is doing arm exercises for 20 minutes 7 times per week. Christie Atkinson returned to our clinic recent from foot surgery. She struggled with eating on the plan. She states that she has been indulging in pastries and pastas over the last 2 weeks.  Her weight is 236 lb (107 kg) today and has gained 2 lbs since her last visit. She has lost 13 lbs since starting treatment with [name].  Pre-Diabetes Christie Atkinson has a diagnosis of pre-diabetes based on her elevated Hgb A1c and was informed this puts her at greater risk of developing diabetes. Her last labs were in April 2020. She is taking metformin currently and denies GI side effects. She continues to work on diet and exercise to decrease risk of diabetes. She denies hypoglycemia.  Vitamin D Deficiency Christie Atkinson has a diagnosis of vitamin D deficiency. She is currently taking prescription Vit D. She notes fatigue and denies nausea, vomiting or muscle weakness.  Atrial Fibrillation Christie Atkinson voices she is back in rhythm. Her blood pressure is controlled with additional Diltiazem.  ASSESSMENT AND PLAN:  Prediabetes - Plan: Comprehensive metabolic panel, Hemoglobin A1c, Insulin, random  Vitamin D deficiency - Plan: VITAMIN D 25 Hydroxy (Vit-D Deficiency, Fractures), Vitamin D, Ergocalciferol, (DRISDOL) 1.25 MG (50000 UT) CAPS capsule  Chronic atrial fibrillation  Class 2 severe obesity with serious comorbidity and body mass index (BMI) of 39.0 to 39.9 in adult, unspecified obesity type (Christie Atkinson)  PLAN:  Pre-Diabetes Christie Atkinson will continue to work on weight loss, exercise, and decreasing simple carbohydrates in her diet to help decrease the risk of diabetes. We dicussed metformin including benefits and risks.  She was informed that eating too many simple carbohydrates or too many calories at one sitting increases the likelihood of GI side effects. Christie Atkinson agrees to continue taking metformin, and we will check Hgb A1c, insulin, and FLP today. Farren agrees to follow up with our clinic in 2 weeks as directed to monitor her progress.  Vitamin D Deficiency Christie Atkinson was informed that low vitamin D levels contributes to fatigue and are associated with obesity, breast, and colon cancer. Christie Atkinson agrees to continue taking prescription Vit D 50,000 IU every week #4 and we will refill for 1 month. She will follow up for routine testing of vitamin D, at least 2-3 times per year. She was informed of the risk of over-replacement of vitamin D and agrees to not increase her dose unless she discusses this with [name] first. We will check Vit D level today. Christie Atkinson agrees to follow up with our clinic in 2 weeks.  Atrial Fibrillation Christie Atkinson agrees to continue her current medications, no change in dose. She will follow up with Cardiology at her previously scheduled appointment. Christie Atkinson agrees to follow up with our clinic in 2 weeks.  Obesity Christie Atkinson is currently in the action stage of change. As such, her goal is to continue with weight loss efforts She has agreed to follow the Category 3 plan Christie Atkinson has been instructed to work up to a goal of 150 minutes of combined cardio and strengthening exercise per week for weight loss and overall health benefits. We discussed the following Behavioral Modification Strategies today: increasing lean protein intake, increasing vegetables and work on meal planning  and easy cooking plans, keeping healthy foods in the home, and planning for success   Christie Atkinson has agreed to follow up with our clinic in 2 weeks. She was informed of the importance of frequent follow up visits to maximize her success with intensive lifestyle modifications for her multiple health conditions.  ALLERGIES: Allergies   Allergen Reactions  . Hydrocodone-Acetaminophen Nausea And Vomiting  . Other Other (See Comments)    Metals Polyester - including hospital gowns and sheets - allergic contact dermatitis  . Sulfur Hives    MEDICATIONS: Current Outpatient Medications on File Prior to Visit  Medication Sig Dispense Refill  . acetaminophen (TYLENOL) 325 MG tablet Take 325 mg by mouth every 6 (six) hours as needed for moderate pain or headache.    [name] apixaban (ELIQUIS) 5 MG TABS tablet Take 1 tablet (5 mg total) by mouth 2 (two) times daily. 60 tablet 6  . diltiazem (CARDIZEM CD) 120 MG 24 hr capsule Take 2 tablets in the AM and 1 tablet in the PM 270 capsule 2  . docusate sodium (COLACE) 100 MG capsule Take 1 capsule (100 mg total) by mouth 2 (two) times daily. While taking narcotic pain medicine. 30 capsule 0  . dofetilide (TIKOSYN) 125 MCG capsule Take 1 capsule (125 mcg total) by mouth 2 (two) times daily. 180 capsule 3  . furosemide (LASIX) 40 MG tablet Take 40 mg by mouth.    . Magnesium Gluconate 500 (27 Mg) MG TABS Take 1 tablet (500 mg total) by mouth daily. 30 tablet 0  . metFORMIN (GLUCOPHAGE) 500 MG tablet Take 1 tablet (500 mg total) by mouth daily with breakfast. 30 tablet 0  . Multiple Vitamin (MULTIVITAMIN WITH MINERALS) TABS tablet Take 1 tablet by mouth daily.     . Multiple Vitamins-Minerals (WOMENS MULTIVITAMIN) TABS Take 1 tablet by mouth daily. 90 tablet 0  . ondansetron (ZOFRAN) 4 MG tablet Take 1 tablet (4 mg total) by mouth daily as needed for nausea or vomiting. 30 tablet 0  . potassium chloride SA (K-DUR,KLOR-CON) 20 MEQ tablet Take 1 tablet (20 mEq total) by mouth daily. Take an extra 1/2 tablet by mouth on days when you take the extra fluid pill (furosemide/Lasix).    [name] senna (SENOKOT) 8.6 MG TABS tablet Take 2 tablets (17.2 mg total) by mouth 2 (two) times daily. 30 tablet 0   No current facility-administered medications on file prior to visit.     PAST MEDICAL HISTORY: Past  Medical History:  Diagnosis Date  . Asthma   . Atrial fibrillation ([place])   . Back pain   . Diastolic dysfunction   . Fibromyalgia   . Gestational diabetes   . Hypertension   . Knee pain   . Osteoarthritis   . Persistent atrial fibrillation   . Sleep apnea    wears oral appliance  . Visit for monitoring Tikosyn therapy 02/07/2018    PAST SURGICAL HISTORY: Past Surgical History:  Procedure Laterality Date  . ABDOMINAL HYSTERECTOMY    . ABLATION OF DYSRHYTHMIC FOCUS  03/09/2018  . APPENDECTOMY    . ATRIAL FIBRILLATION ABLATION N/A 03/09/2018   Procedure: ATRIAL FIBRILLATION ABLATION;  Surgeon: [name], Christie Atkinson;  Location: [place] [facility];  Service: Cardiovascular;  Laterality: N/A;  . CARDIOVERSION N/A 07/04/2017   Procedure: CARDIOVERSION;  Surgeon: [name], Christie Atkinson;  Location: [facility] ENDOSCOPY;  Service: Cardiovascular;  Laterality: N/A;  . CARDIOVERSION N/A 09/06/2017   Procedure: CARDIOVERSION;  Surgeon: [name], Christie Atkinson;  Location:  [place] ENDOSCOPY;  Service: Cardiovascular;  Laterality: N/A;  . CHOLECYSTECTOMY N/A 02/24/2015   Procedure: LAPAROSCOPIC CHOLECYSTECTOMY;  Surgeon: [name], Christie Atkinson;  Location: [place];  Service: General;  Laterality: N/A;  . EXCISION HAGLUND'S DEFORMITY WITH ACHILLES TENDON REPAIR Left 12/21/2018   Procedure: Left Achilles Tendon Debridement and Reconstruction; Excision of Haglund Deformity;  Surgeon: [name], Christie Atkinson;  Location: [place];  Service: Orthopedics;  Laterality: Left;  [name] GASTROCNEMIUS RECESSION Left 12/21/2018   Procedure: Left Gastroc Recession;  Surgeon: [name], Christie Atkinson;  Location: [place];  Service: Orthopedics;  Laterality: Left;  . RIGHT/LEFT HEART CATH AND CORONARY ANGIOGRAPHY N/A 08/09/2017   Procedure: RIGHT/LEFT HEART CATH AND CORONARY ANGIOGRAPHY;  Surgeon: [name], Christie Atkinson;  Location: [place] [facility];  Service: Cardiovascular;  Laterality: N/A;  . TEE WITHOUT CARDIOVERSION N/A 07/04/2017    Procedure: TRANSESOPHAGEAL ECHOCARDIOGRAM (TEE);  Surgeon: [name], Christie Atkinson;  Location: [facility] ENDOSCOPY;  Service: Cardiovascular;  Laterality: N/A;  . TEE WITHOUT CARDIOVERSION N/A 03/09/2018   Procedure: TRANSESOPHAGEAL ECHOCARDIOGRAM (TEE);  Surgeon: [name], Christie Atkinson;  Location: [facility] ENDOSCOPY;  Service: Cardiovascular;  Laterality: N/A;    SOCIAL HISTORY: Social History   Tobacco Use  . Smoking status: Never Smoker  . Smokeless tobacco: Never Used  Substance Use Topics  . Alcohol use: Yes    Comment: little  . Drug use: No    FAMILY HISTORY: Family History  Problem Relation Age of Onset  . Heart disease Mother   . Hypertension Mother   . Hyperlipidemia Mother   . Diabetes Mother   . Sudden death Mother   . Thyroid disease Mother   . Obesity Mother   . Heart disease Father   . Heart attack Father   . Hypertension Father   . Hyperlipidemia Father   . Diabetes Father     ROS: Review of Systems  Constitutional: Positive for malaise/fatigue. Negative for weight loss.  Gastrointestinal: Negative for nausea and vomiting.  Musculoskeletal:       Negative muscle weakness  Endo/Heme/Allergies:       Negative hypoglycemia    PHYSICAL EXAM: Blood pressure 125/72, pulse 73, temperature 97.6 F (36.4 C), temperature source Oral, height 5\' 5"  (1.651 m), weight 236 lb (107 kg), SpO2 96 %. Body mass index is 39.27 kg/m. Physical Exam Vitals signs reviewed.  Constitutional:      Appearance: Normal appearance. She is obese.  Cardiovascular:     Rate and Rhythm: Normal rate.     Pulses: Normal pulses.  Pulmonary:     Effort: Pulmonary effort is normal.     Breath sounds: Normal breath sounds.  Musculoskeletal: Normal range of motion.  Skin:    General: Skin is warm and dry.  Neurological:     Mental Status: She is alert and oriented to person, place, and time.  Psychiatric:        Mood and Affect: Mood normal.        Behavior: Behavior normal.     RECENT LABS  AND TESTS: BMET    Component Value Date/Time   NA 141 01/24/2019 1313   K 4.5 01/24/2019 1313   CL 103 01/24/2019 1313   CO2 21 01/24/2019 1313   GLUCOSE 99 01/24/2019 1313   GLUCOSE 93 12/14/2018 1500   BUN 16 01/24/2019 1313   CREATININE 0.80 01/24/2019 1313   CALCIUM 9.6 01/24/2019 1313   GFRNONAA 79 01/24/2019 1313   GFRAA 91 01/24/2019 1313   Lab  Results  Component Value Date   HGBA1C 5.8 (H) 01/24/2019   HGBA1C 5.8 (H) 09/21/2018   HGBA1C 6.0 (H) 05/17/2018   HGBA1C 5.8 (H) 11/02/2017   HGBA1C 5.8 (H) 06/26/2017   Lab Results  Component Value Date   INSULIN 20.0 01/24/2019   INSULIN 24.4 09/21/2018   INSULIN 16.1 05/17/2018   INSULIN 8.5 11/02/2017   CBC    Component Value Date/Time   WBC 8.5 03/08/2018 1442   WBC 7.4 08/31/2017 0930   RBC 4.46 03/08/2018 1442   RBC 4.63 08/31/2017 0930   HGB 14.0 03/08/2018 1442   HCT 42.2 03/08/2018 1442   PLT 256 03/08/2018 1442   MCV 95 03/08/2018 1442   MCH 31.4 03/08/2018 1442   MCH 30.2 08/31/2017 0930   MCHC 33.2 03/08/2018 1442   MCHC 33.2 08/31/2017 0930   RDW 13.4 03/08/2018 1442   LYMPHSABS 2.5 03/08/2018 1442   MONOABS 1.2 (H) 02/23/2015 0619   EOSABS 0.1 03/08/2018 1442   BASOSABS 0.0 03/08/2018 1442   Iron/TIBC/Ferritin/ %Sat No results found for: IRON, TIBC, FERRITIN, IRONPCTSAT Lipid Panel     Component Value Date/Time   CHOL 160 01/24/2019 1313   TRIG 174 (H) 01/24/2019 1313   HDL 31 (L) 01/24/2019 1313   CHOLHDL 5.4 06/26/2017 0517   VLDL 15 06/26/2017 0517   LDLCALC 94 01/24/2019 1313   Hepatic Function Panel     Component Value Date/Time   PROT 7.4 01/24/2019 1313   ALBUMIN 4.3 01/24/2019 1313   AST 20 01/24/2019 1313   ALT 11 01/24/2019 1313   ALKPHOS 88 01/24/2019 1313   BILITOT 0.3 01/24/2019 1313      Component Value Date/Time   TSH 4.610 (H) 09/21/2018 0951   TSH 3.010 11/02/2017 1254   TSH 4.410 06/24/2017 1046      OBESITY BEHAVIORAL INTERVENTION VISIT  Today's  visit was # 24   Starting weight: 249 lbs Starting date: 11/02/17 Today's weight : 236 lbs Today's date: 01/24/2019 Total lbs lost to date: 13 At least 15 minutes were spent on discussing the following behavioral intervention visit.   ASK: We discussed the diagnosis of obesity with Christie Atkinson today and Christie Atkinson agreed to give [name] permission to discuss obesity behavioral modification therapy today.  ASSESS: Christie Atkinson has the diagnosis of obesity and her BMI today is 39.27 Christie Atkinson is in the action stage of change   ADVISE: Christie Atkinson was educated on the multiple health risks of obesity as well as the benefit of weight loss to improve her health. She was advised of the need for long term treatment and the importance of lifestyle modifications to improve her current health and to decrease her risk of future health problems.  AGREE: Multiple dietary modification options and treatment options were discussed and  Aanvi agreed to follow the recommendations documented in the above note.  ARRANGE: Shaquinta was educated on the importance of frequent visits to treat obesity as outlined per CMS and USPSTF guidelines and agreed to schedule her next follow up appointment today.  I, Christie Atkinson, am acting as transcriptionist for Christie Qua, Christie Atkinson  I have reviewed the above documentation for accuracy and completeness, and I agree with the above. - Christie Qua, Christie Atkinson

## 2019-02-07 DIAGNOSIS — R2689 Other abnormalities of gait and mobility: Secondary | ICD-10-CM | POA: Diagnosis not present

## 2019-02-07 DIAGNOSIS — M25672 Stiffness of left ankle, not elsewhere classified: Secondary | ICD-10-CM | POA: Diagnosis not present

## 2019-02-07 DIAGNOSIS — M25572 Pain in left ankle and joints of left foot: Secondary | ICD-10-CM | POA: Diagnosis not present

## 2019-02-14 ENCOUNTER — Other Ambulatory Visit: Payer: Self-pay

## 2019-02-14 ENCOUNTER — Ambulatory Visit (INDEPENDENT_AMBULATORY_CARE_PROVIDER_SITE_OTHER): Payer: Medicare Other | Admitting: Family Medicine

## 2019-02-14 ENCOUNTER — Encounter (INDEPENDENT_AMBULATORY_CARE_PROVIDER_SITE_OTHER): Payer: Self-pay | Admitting: Family Medicine

## 2019-02-14 DIAGNOSIS — E559 Vitamin D deficiency, unspecified: Secondary | ICD-10-CM | POA: Diagnosis not present

## 2019-02-14 DIAGNOSIS — Z6839 Body mass index (BMI) 39.0-39.9, adult: Secondary | ICD-10-CM | POA: Diagnosis not present

## 2019-02-14 DIAGNOSIS — I482 Chronic atrial fibrillation, unspecified: Secondary | ICD-10-CM | POA: Diagnosis not present

## 2019-02-15 DIAGNOSIS — R2689 Other abnormalities of gait and mobility: Secondary | ICD-10-CM | POA: Diagnosis not present

## 2019-02-15 DIAGNOSIS — M25672 Stiffness of left ankle, not elsewhere classified: Secondary | ICD-10-CM | POA: Diagnosis not present

## 2019-02-15 DIAGNOSIS — M25572 Pain in left ankle and joints of left foot: Secondary | ICD-10-CM | POA: Diagnosis not present

## 2019-02-19 DIAGNOSIS — M25672 Stiffness of left ankle, not elsewhere classified: Secondary | ICD-10-CM | POA: Diagnosis not present

## 2019-02-19 DIAGNOSIS — M25572 Pain in left ankle and joints of left foot: Secondary | ICD-10-CM | POA: Diagnosis not present

## 2019-02-19 DIAGNOSIS — R2689 Other abnormalities of gait and mobility: Secondary | ICD-10-CM | POA: Diagnosis not present

## 2019-02-22 DIAGNOSIS — M25672 Stiffness of left ankle, not elsewhere classified: Secondary | ICD-10-CM | POA: Diagnosis not present

## 2019-02-22 DIAGNOSIS — M25572 Pain in left ankle and joints of left foot: Secondary | ICD-10-CM | POA: Diagnosis not present

## 2019-02-22 DIAGNOSIS — R2689 Other abnormalities of gait and mobility: Secondary | ICD-10-CM | POA: Diagnosis not present

## 2019-02-22 NOTE — Progress Notes (Signed)
Office: [phone]  /  Fax: [phone] TeleHealth Visit:  Christie Atkinson has verbally consented to this TeleHealth visit today. The patient is located at home, the provider is located at the [employer] [facility] office. The participants in this visit include the listed provider and patient. Christie Atkinson was unable to use realtime audiovisual technology today and the telehealth visit was conducted via telephone.  HPI:   Chief Complaint: OBESITY Christie Atkinson is here to discuss her progress with her obesity treatment plan. She is on the Category 3 plan and is following her eating plan approximately 75 to 80 % of the time. She states she is walking some with the walker, but is unable to walk as much. Christie Atkinson is trying to rest more given the pain in her foot. She reports that she has noticed that she is needing to use a walker more status post surgery. She has been following her meal plan for the most part, but does not mention carb cravings.  We were unable to weigh the patient today for this TeleHealth visit. She feels as if she has maintained weight since her last visit. She has lost 13 lbs since starting treatment with [name].  Chronic Atrial Fibrillation Christie Atkinson sees cardiology and she was encouraged to follow up with cardiology previously as she was experiencing going in and out of atrial fibrillation.  Vitamin D Deficiency Christie Atkinson has a diagnosis of vitamin D deficiency and her last level was 61.0 on 01/24/19. Christie Atkinson admits fatigued and denies nausea, vomiting, or muscle weakness.  ASSESSMENT AND PLAN:  Chronic atrial fibrillation  Vitamin D deficiency  Class 2 severe obesity with serious comorbidity and body mass index (BMI) of 39.0 to 39.9 in adult, unspecified obesity type ([place])  PLAN:  Chronic Atrial Fibrillation Christie Atkinson was encouraged to follow up with even a follow up a telephone call to cardiology to ensure adequate management.  Vitamin D Deficiency Christie Atkinson was informed that  low vitamin D levels contribute to fatigue and are associated with obesity, breast, and colon cancer. Christie Atkinson agrees to continue to take prescription Vit D @50 ,000 IU every week with no refills needed. We will need to switch to every 14 days for his next prescription. He will follow up for routine testing of vitamin D, at least 2-3 times per year. She was informed of the risk of over-replacement of vitamin D and agrees to not increase her dose unless she discusses this with [name] first. Christie Atkinson agrees to follow up in 2 weeks as directed.  Obesity Christie Atkinson is currently in the action stage of change. As such, her goal is to continue with weight loss efforts. She has agreed to follow the Category 3 plan. Christie Atkinson has been instructed to work up to a goal of 150 minutes of combined cardio and strengthening exercise per week for weight loss and overall health benefits. We discussed the following Behavioral Modification Strategies today: increasing lean protein intake, increasing vegetables, keeping healthy foods in the home, planning for success, and work on meal planning and easy cooking plans.  Christie Atkinson has agreed to follow up with our clinic in 2 weeks. She was informed of the importance of frequent follow up visits to maximize her success with intensive lifestyle modifications for her multiple health conditions.  ALLERGIES: Allergies  Allergen Reactions  . Hydrocodone-Acetaminophen Nausea And Vomiting  . Other Other (See Comments)    Metals Polyester - including hospital gowns and sheets - allergic contact dermatitis  . Sulfur Hives    MEDICATIONS: Current  Outpatient Medications on File Prior to Visit  Medication Sig Dispense Refill  . acetaminophen (TYLENOL) 325 MG tablet Take 325 mg by mouth every 6 (six) hours as needed for moderate pain or headache.    [name] apixaban (ELIQUIS) 5 MG TABS tablet Take 1 tablet (5 mg total) by mouth 2 (two) times daily. 60 tablet 6  . diltiazem (CARDIZEM CD) 120 MG 24  hr capsule Take 2 tablets in the AM and 1 tablet in the PM 270 capsule 2  . dofetilide (TIKOSYN) 125 MCG capsule Take 1 capsule (125 mcg total) by mouth 2 (two) times daily. 180 capsule 3  . furosemide (LASIX) 40 MG tablet Take 40 mg by mouth.    . Magnesium Gluconate 500 (27 Mg) MG TABS Take 1 tablet (500 mg total) by mouth daily. 30 tablet 0  . metFORMIN (GLUCOPHAGE) 500 MG tablet Take 1 tablet (500 mg total) by mouth daily with breakfast. 30 tablet 0  . Multiple Vitamin (MULTIVITAMIN WITH MINERALS) TABS tablet Take 1 tablet by mouth daily.     . Multiple Vitamins-Minerals (WOMENS MULTIVITAMIN) TABS Take 1 tablet by mouth daily. 90 tablet 0  . potassium chloride SA (K-DUR,KLOR-CON) 20 MEQ tablet Take 1 tablet (20 mEq total) by mouth daily. Take an extra 1/2 tablet by mouth on days when you take the extra fluid pill (furosemide/Lasix).    . Vitamin D, Ergocalciferol, (DRISDOL) 1.25 MG (50000 UT) CAPS capsule Take 1 capsule (50,000 Units total) by mouth every 7 (seven) days. TAKE 1 CAPSULE BY MOUTH EVERY 7 DAYS 4 capsule 0  . docusate sodium (COLACE) 100 MG capsule Take 1 capsule (100 mg total) by mouth 2 (two) times daily. While taking narcotic pain medicine. (Patient not taking: Reported on 02/14/2019) 30 capsule 0  . ondansetron (ZOFRAN) 4 MG tablet Take 1 tablet (4 mg total) by mouth daily as needed for nausea or vomiting. (Patient not taking: Reported on 02/14/2019) 30 tablet 0  . senna (SENOKOT) 8.6 MG TABS tablet Take 2 tablets (17.2 mg total) by mouth 2 (two) times daily. (Patient not taking: Reported on 02/14/2019) 30 tablet 0   No current facility-administered medications on file prior to visit.     PAST MEDICAL HISTORY: Past Medical History:  Diagnosis Date  . Asthma   . Atrial fibrillation ([place])   . Back pain   . Diastolic dysfunction   . Fibromyalgia   . Gestational diabetes   . Hypertension   . Knee pain   . Osteoarthritis   . Persistent atrial fibrillation   . Sleep apnea     wears oral appliance  . Visit for monitoring Tikosyn therapy 02/07/2018    PAST SURGICAL HISTORY: Past Surgical History:  Procedure Laterality Date  . ABDOMINAL HYSTERECTOMY    . ABLATION OF DYSRHYTHMIC FOCUS  03/09/2018  . APPENDECTOMY    . ATRIAL FIBRILLATION ABLATION N/A 03/09/2018   Procedure: ATRIAL FIBRILLATION ABLATION;  Surgeon: [name], MD;  Location: [place] [facility];  Service: Cardiovascular;  Laterality: N/A;  . CARDIOVERSION N/A 07/04/2017   Procedure: CARDIOVERSION;  Surgeon: [name], MD;  Location: [facility] ENDOSCOPY;  Service: Cardiovascular;  Laterality: N/A;  . CARDIOVERSION N/A 09/06/2017   Procedure: CARDIOVERSION;  Surgeon: [name], MD;  Location: [facility] ENDOSCOPY;  Service: Cardiovascular;  Laterality: N/A;  . CHOLECYSTECTOMY N/A 02/24/2015   Procedure: LAPAROSCOPIC CHOLECYSTECTOMY;  Surgeon: [name], MD;  Location: [place];  Service: General;  Laterality: N/A;  . EXCISION HAGLUND'S DEFORMITY WITH ACHILLES TENDON  REPAIR Left 12/21/2018   Procedure: Left Achilles Tendon Debridement and Reconstruction; Excision of Haglund Deformity;  Surgeon: [name], MD;  Location: [place];  Service: Orthopedics;  Laterality: Left;  [name] GASTROCNEMIUS RECESSION Left 12/21/2018   Procedure: Left Gastroc Recession;  Surgeon: [name], MD;  Location: [place];  Service: Orthopedics;  Laterality: Left;  . RIGHT/LEFT HEART CATH AND CORONARY ANGIOGRAPHY N/A 08/09/2017   Procedure: RIGHT/LEFT HEART CATH AND CORONARY ANGIOGRAPHY;  Surgeon: [name], MD;  Location: [place] [facility];  Service: Cardiovascular;  Laterality: N/A;  . TEE WITHOUT CARDIOVERSION N/A 07/04/2017   Procedure: TRANSESOPHAGEAL ECHOCARDIOGRAM (TEE);  Surgeon: [name], MD;  Location: [facility] ENDOSCOPY;  Service: Cardiovascular;  Laterality: N/A;  . TEE WITHOUT CARDIOVERSION N/A 03/09/2018   Procedure: TRANSESOPHAGEAL ECHOCARDIOGRAM (TEE);  Surgeon: [name], MD;   Location: [facility] ENDOSCOPY;  Service: Cardiovascular;  Laterality: N/A;    SOCIAL HISTORY: Social History   Tobacco Use  . Smoking status: Never Smoker  . Smokeless tobacco: Never Used  Substance Use Topics  . Alcohol use: Yes    Comment: little  . Drug use: No    FAMILY HISTORY: Family History  Problem Relation Age of Onset  . Heart disease Mother   . Hypertension Mother   . Hyperlipidemia Mother   . Diabetes Mother   . Sudden death Mother   . Thyroid disease Mother   . Obesity Mother   . Heart disease Father   . Heart attack Father   . Hypertension Father   . Hyperlipidemia Father   . Diabetes Father     ROS: Review of Systems  Constitutional: Positive for malaise/fatigue.  Gastrointestinal: Negative for nausea and vomiting.  Musculoskeletal:       Negative for muscle weakness.    PHYSICAL EXAM: Pt in no acute distress  RECENT LABS AND TESTS: BMET    Component Value Date/Time   NA 141 01/24/2019 1313   K 4.5 01/24/2019 1313   CL 103 01/24/2019 1313   CO2 21 01/24/2019 1313   GLUCOSE 99 01/24/2019 1313   GLUCOSE 93 12/14/2018 1500   BUN 16 01/24/2019 1313   CREATININE 0.80 01/24/2019 1313   CALCIUM 9.6 01/24/2019 1313   GFRNONAA 79 01/24/2019 1313   GFRAA 91 01/24/2019 1313   Lab Results  Component Value Date   HGBA1C 5.8 (H) 01/24/2019   HGBA1C 5.8 (H) 09/21/2018   HGBA1C 6.0 (H) 05/17/2018   HGBA1C 5.8 (H) 11/02/2017   HGBA1C 5.8 (H) 06/26/2017   Lab Results  Component Value Date   INSULIN 20.0 01/24/2019   INSULIN 24.4 09/21/2018   INSULIN 16.1 05/17/2018   INSULIN 8.5 11/02/2017   CBC    Component Value Date/Time   WBC 8.5 03/08/2018 1442   WBC 7.4 08/31/2017 0930   RBC 4.46 03/08/2018 1442   RBC 4.63 08/31/2017 0930   HGB 14.0 03/08/2018 1442   HCT 42.2 03/08/2018 1442   PLT 256 03/08/2018 1442   MCV 95 03/08/2018 1442   MCH 31.4 03/08/2018 1442   MCH 30.2 08/31/2017 0930   MCHC 33.2 03/08/2018 1442   MCHC 33.2 08/31/2017 0930    RDW 13.4 03/08/2018 1442   LYMPHSABS 2.5 03/08/2018 1442   MONOABS 1.2 (H) 02/23/2015 0619   EOSABS 0.1 03/08/2018 1442   BASOSABS 0.0 03/08/2018 1442   Iron/TIBC/Ferritin/ %Sat No results found for: IRON, TIBC, FERRITIN, IRONPCTSAT Lipid Panel     Component Value Date/Time   CHOL  160 01/24/2019 1313   TRIG 174 (H) 01/24/2019 1313   HDL 31 (L) 01/24/2019 1313   CHOLHDL 5.4 06/26/2017 0517   VLDL 15 06/26/2017 0517   LDLCALC 94 01/24/2019 1313   Hepatic Function Panel     Component Value Date/Time   PROT 7.4 01/24/2019 1313   ALBUMIN 4.3 01/24/2019 1313   AST 20 01/24/2019 1313   ALT 11 01/24/2019 1313   ALKPHOS 88 01/24/2019 1313   BILITOT 0.3 01/24/2019 1313      Component Value Date/Time   TSH 4.610 (H) 09/21/2018 0951   TSH 3.010 11/02/2017 1254   TSH 4.410 06/24/2017 1046   Results for ARLET, MASLEY (MRN [id number]) as of 02/22/2019 15:48  Ref. Range 01/24/2019 13:13  Vitamin D, 25-Hydroxy Latest Ref Range: 30.0 - 100.0 ng/mL 61.0    I, Christie Atkinson, CMA, am acting as transcriptionist for Ilene Qua, MD  I have reviewed the above documentation for accuracy and completeness, and I agree with the above. - Ilene Qua, MD

## 2019-02-27 ENCOUNTER — Encounter (INDEPENDENT_AMBULATORY_CARE_PROVIDER_SITE_OTHER): Payer: Self-pay | Admitting: Family Medicine

## 2019-02-27 ENCOUNTER — Ambulatory Visit (INDEPENDENT_AMBULATORY_CARE_PROVIDER_SITE_OTHER): Payer: Medicare Other | Admitting: Family Medicine

## 2019-02-27 ENCOUNTER — Other Ambulatory Visit: Payer: Self-pay

## 2019-02-27 DIAGNOSIS — E559 Vitamin D deficiency, unspecified: Secondary | ICD-10-CM | POA: Diagnosis not present

## 2019-02-27 DIAGNOSIS — R7303 Prediabetes: Secondary | ICD-10-CM | POA: Diagnosis not present

## 2019-02-27 DIAGNOSIS — Z6839 Body mass index (BMI) 39.0-39.9, adult: Secondary | ICD-10-CM

## 2019-02-27 DIAGNOSIS — M25572 Pain in left ankle and joints of left foot: Secondary | ICD-10-CM | POA: Diagnosis not present

## 2019-02-27 DIAGNOSIS — R2689 Other abnormalities of gait and mobility: Secondary | ICD-10-CM | POA: Diagnosis not present

## 2019-02-27 DIAGNOSIS — M25672 Stiffness of left ankle, not elsewhere classified: Secondary | ICD-10-CM | POA: Diagnosis not present

## 2019-02-27 MED ORDER — METFORMIN HCL 500 MG PO TABS: 500.0000 mg | ORAL_TABLET | Freq: Every day | ORAL | 0 refills | Status: DC

## 2019-02-27 MED ORDER — VITAMIN D (ERGOCALCIFEROL) 1.25 MG (50000 UNIT) PO CAPS: 50000.0000 [IU] | ORAL_CAPSULE | ORAL | 0 refills | Status: DC

## 2019-03-01 NOTE — Progress Notes (Signed)
Office: [phone]  /  Fax: [phone] TeleHealth Visit:  Christie Atkinson has verbally consented to this TeleHealth visit today. The patient is located at home, the provider is located at the [employer] [facility] office. The participants in this visit include the listed provider and patient. Christie Atkinson was unable to use realtime audiovisual technology today and the telehealth visit was conducted via telephone.   HPI:   Chief Complaint: OBESITY Christie Atkinson is here to discuss her progress with her obesity treatment plan. She is on the Category 3 plan and is following her eating plan approximately 90 % of the time. She states she is exercising 0 minutes 0 times per week. [name] had a birthday last week and she had a small familial celebration. She reports since last week she has been experiencing some decrease in blood sugars (or symptoms of it). She has had to eat to keep sugars up.  We were unable to weigh the patient today for this TeleHealth visit. She feels as if she has lost weight since her last visit. She has lost 13 lbs since starting treatment with [name].  Vitamin D Deficiency Christie Atkinson has a diagnosis of vitamin D deficiency. She is currently taking prescription Vit D. She notes fatigue and denies nausea, vomiting or muscle weakness.  Pre-Diabetes Christie Atkinson has a diagnosis of pre-diabetes based on her elevated Hgb A1c and was informed this puts her at greater risk of developing diabetes. Last Hgb A1c was of 5.8. She denies GI side effects of metformin and continues to work on diet and exercise to decrease risk of diabetes. She denies hypoglycemia.  ASSESSMENT AND PLAN:  Class 2 severe obesity with serious comorbidity and body mass index (BMI) of 39.0 to 39.9 in adult, unspecified obesity type ([facility])  Prediabetes - Plan: metFORMIN (GLUCOPHAGE) 500 MG tablet  Vitamin D deficiency - Plan: Vitamin D, Ergocalciferol, (DRISDOL) 1.25 MG (50000 UT) CAPS capsule  PLAN:  Vitamin D  Deficiency Christie Atkinson was informed that low vitamin D levels contributes to fatigue and are associated with obesity, breast, and colon cancer. Christie Atkinson agrees to continue taking prescription Vit D 50,000 IU every week #4 and we will refill for 1 month. She will follow up for routine testing of vitamin D, at least 2-3 times per year. She was informed of the risk of over-replacement of vitamin D and agrees to not increase her dose unless she discusses this with [name] first. Christie Atkinson agrees to follow up with our clinic in 2 weeks.  Pre-Diabetes Christie Atkinson will continue to work on weight loss, exercise, and decreasing simple carbohydrates in her diet to help decrease the risk of diabetes. We dicussed metformin including benefits and risks. She was informed that eating too many simple carbohydrates or too many calories at one sitting increases the likelihood of GI side effects. Christie Atkinson agrees to continue taking metformin 500 mg PO q AM #30 and we will refill for 1 month. Christie Atkinson agrees to follow up with our clinic in 2 weeks as directed to monitor her progress.    Obesity Christie Atkinson is currently in the action stage of change. As such, her goal is to continue with weight loss efforts She has agreed to follow the Category 3 plan Christie Atkinson has been instructed to work up to a goal of 150 minutes of combined cardio and strengthening exercise per week for weight loss and overall health benefits. We discussed the following Behavioral Modification Strategies today: increasing lean protein intake, increasing vegetables, work on meal planning and easy cooking  plans, keeping healthy foods in the home, avoiding temptations, and planning for success   Christie Atkinson has agreed to follow up with our clinic in 2 weeks. She was informed of the importance of frequent follow up visits to maximize her success with intensive lifestyle modifications for her multiple health conditions.  ALLERGIES: Allergies  Allergen Reactions  .  Hydrocodone-Acetaminophen Nausea And Vomiting  . Other Other (See Comments)    Metals Polyester - including hospital gowns and sheets - allergic contact dermatitis  . Sulfur Hives    MEDICATIONS: Current Outpatient Medications on File Prior to Visit  Medication Sig Dispense Refill  . acetaminophen (TYLENOL) 325 MG tablet Take 325 mg by mouth every 6 (six) hours as needed for moderate pain or headache.    [name] apixaban (ELIQUIS) 5 MG TABS tablet Take 1 tablet (5 mg total) by mouth 2 (two) times daily. 60 tablet 6  . diltiazem (CARDIZEM CD) 120 MG 24 hr capsule Take 2 tablets in the AM and 1 tablet in the PM 270 capsule 2  . docusate sodium (COLACE) 100 MG capsule Take 1 capsule (100 mg total) by mouth 2 (two) times daily. While taking narcotic pain medicine. 30 capsule 0  . dofetilide (TIKOSYN) 125 MCG capsule Take 1 capsule (125 mcg total) by mouth 2 (two) times daily. 180 capsule 3  . furosemide (LASIX) 40 MG tablet Take 40 mg by mouth.    . Magnesium Gluconate 500 (27 Mg) MG TABS Take 1 tablet (500 mg total) by mouth daily. 30 tablet 0  . Multiple Vitamin (MULTIVITAMIN WITH MINERALS) TABS tablet Take 1 tablet by mouth daily.     . Multiple Vitamins-Minerals (WOMENS MULTIVITAMIN) TABS Take 1 tablet by mouth daily. 90 tablet 0  . ondansetron (ZOFRAN) 4 MG tablet Take 1 tablet (4 mg total) by mouth daily as needed for nausea or vomiting. 30 tablet 0  . potassium chloride SA (K-DUR,KLOR-CON) 20 MEQ tablet Take 1 tablet (20 mEq total) by mouth daily. Take an extra 1/2 tablet by mouth on days when you take the extra fluid pill (furosemide/Lasix).    [name] senna (SENOKOT) 8.6 MG TABS tablet Take 2 tablets (17.2 mg total) by mouth 2 (two) times daily. 30 tablet 0   No current facility-administered medications on file prior to visit.     PAST MEDICAL HISTORY: Past Medical History:  Diagnosis Date  . Asthma   . Atrial fibrillation ([place])   . Back pain   . Diastolic dysfunction   . Fibromyalgia   .  Gestational diabetes   . Hypertension   . Knee pain   . Osteoarthritis   . Persistent atrial fibrillation   . Sleep apnea    wears oral appliance  . Visit for monitoring Tikosyn therapy 02/07/2018    PAST SURGICAL HISTORY: Past Surgical History:  Procedure Laterality Date  . ABDOMINAL HYSTERECTOMY    . ABLATION OF DYSRHYTHMIC FOCUS  03/09/2018  . APPENDECTOMY    . ATRIAL FIBRILLATION ABLATION N/A 03/09/2018   Procedure: ATRIAL FIBRILLATION ABLATION;  Surgeon: [name], MD;  Location: [place] [facility];  Service: Cardiovascular;  Laterality: N/A;  . CARDIOVERSION N/A 07/04/2017   Procedure: CARDIOVERSION;  Surgeon: [name], MD;  Location: [facility] ENDOSCOPY;  Service: Cardiovascular;  Laterality: N/A;  . CARDIOVERSION N/A 09/06/2017   Procedure: CARDIOVERSION;  Surgeon: [name], MD;  Location[facility] ENDOSCOPY;  Service: Cardiovascular;  Laterality: N/A;  . CHOLECYSTECTOMY N/A 02/24/2015   Procedure: LAPAROSCOPIC CHOLECYSTECTOMY;  Surgeon: [name],  MD;  Location: [place];  Service: General;  Laterality: N/A;  . EXCISION HAGLUND'S DEFORMITY WITH ACHILLES TENDON REPAIR Left 12/21/2018   Procedure: Left Achilles Tendon Debridement and Reconstruction; Excision of Haglund Deformity;  Surgeon: [name], MD;  Location: [place];  Service: Orthopedics;  Laterality: Left;  [name] GASTROCNEMIUS RECESSION Left 12/21/2018   Procedure: Left Gastroc Recession;  Surgeon: [name], MD;  Location: [place];  Service: Orthopedics;  Laterality: Left;  . RIGHT/LEFT HEART CATH AND CORONARY ANGIOGRAPHY N/A 08/09/2017   Procedure: RIGHT/LEFT HEART CATH AND CORONARY ANGIOGRAPHY;  Surgeon: [name], MD;  Location: [place] [facility];  Service: Cardiovascular;  Laterality: N/A;  . TEE WITHOUT CARDIOVERSION N/A 07/04/2017   Procedure: TRANSESOPHAGEAL ECHOCARDIOGRAM (TEE);  Surgeon: [name], MD;  Location: [facility] ENDOSCOPY;  Service: Cardiovascular;  Laterality:  N/A;  . TEE WITHOUT CARDIOVERSION N/A 03/09/2018   Procedure: TRANSESOPHAGEAL ECHOCARDIOGRAM (TEE);  Surgeon: [name], MD;  Location: [facility] ENDOSCOPY;  Service: Cardiovascular;  Laterality: N/A;    SOCIAL HISTORY: Social History   Tobacco Use  . Smoking status: Never Smoker  . Smokeless tobacco: Never Used  Substance Use Topics  . Alcohol use: Yes    Comment: little  . Drug use: No    FAMILY HISTORY: Family History  Problem Relation Age of Onset  . Heart disease Mother   . Hypertension Mother   . Hyperlipidemia Mother   . Diabetes Mother   . Sudden death Mother   . Thyroid disease Mother   . Obesity Mother   . Heart disease Father   . Heart attack Father   . Hypertension Father   . Hyperlipidemia Father   . Diabetes Father     ROS: Review of Systems  Constitutional: Positive for malaise/fatigue and weight loss.  Gastrointestinal: Negative for nausea and vomiting.  Musculoskeletal:       Negative muscle weakness  Endo/Heme/Allergies:       Negative hypoglycemia    PHYSICAL EXAM: Pt in no acute distress  RECENT LABS AND TESTS: BMET    Component Value Date/Time   NA 141 01/24/2019 1313   K 4.5 01/24/2019 1313   CL 103 01/24/2019 1313   CO2 21 01/24/2019 1313   GLUCOSE 99 01/24/2019 1313   GLUCOSE 93 12/14/2018 1500   BUN 16 01/24/2019 1313   CREATININE 0.80 01/24/2019 1313   CALCIUM 9.6 01/24/2019 1313   GFRNONAA 79 01/24/2019 1313   GFRAA 91 01/24/2019 1313   Lab Results  Component Value Date   HGBA1C 5.8 (H) 01/24/2019   HGBA1C 5.8 (H) 09/21/2018   HGBA1C 6.0 (H) 05/17/2018   HGBA1C 5.8 (H) 11/02/2017   HGBA1C 5.8 (H) 06/26/2017   Lab Results  Component Value Date   INSULIN 20.0 01/24/2019   INSULIN 24.4 09/21/2018   INSULIN 16.1 05/17/2018   INSULIN 8.5 11/02/2017   CBC    Component Value Date/Time   WBC 8.5 03/08/2018 1442   WBC 7.4 08/31/2017 0930   RBC 4.46 03/08/2018 1442   RBC 4.63 08/31/2017 0930   HGB 14.0 03/08/2018  1442   HCT 42.2 03/08/2018 1442   PLT 256 03/08/2018 1442   MCV 95 03/08/2018 1442   MCH 31.4 03/08/2018 1442   MCH 30.2 08/31/2017 0930   MCHC 33.2 03/08/2018 1442   MCHC 33.2 08/31/2017 0930   RDW 13.4 03/08/2018 1442   LYMPHSABS 2.5 03/08/2018 1442   MONOABS 1.2 (H) 02/23/2015 0619   EOSABS 0.1 03/08/2018  1442   BASOSABS 0.0 03/08/2018 1442   Iron/TIBC/Ferritin/ %Sat No results found for: IRON, TIBC, FERRITIN, IRONPCTSAT Lipid Panel     Component Value Date/Time   CHOL 160 01/24/2019 1313   TRIG 174 (H) 01/24/2019 1313   HDL 31 (L) 01/24/2019 1313   CHOLHDL 5.4 06/26/2017 0517   VLDL 15 06/26/2017 0517   LDLCALC 94 01/24/2019 1313   Hepatic Function Panel     Component Value Date/Time   PROT 7.4 01/24/2019 1313   ALBUMIN 4.3 01/24/2019 1313   AST 20 01/24/2019 1313   ALT 11 01/24/2019 1313   ALKPHOS 88 01/24/2019 1313   BILITOT 0.3 01/24/2019 1313      Component Value Date/Time   TSH 4.610 (H) 09/21/2018 0951   TSH 3.010 11/02/2017 1254   TSH 4.410 06/24/2017 1046      I, Trixie Dredge, am acting as transcriptionist for Ilene Qua, MD  I have reviewed the above documentation for accuracy and completeness, and I agree with the above. - Ilene Qua, MD

## 2019-03-02 DIAGNOSIS — M25572 Pain in left ankle and joints of left foot: Secondary | ICD-10-CM | POA: Diagnosis not present

## 2019-03-02 DIAGNOSIS — M25672 Stiffness of left ankle, not elsewhere classified: Secondary | ICD-10-CM | POA: Diagnosis not present

## 2019-03-02 DIAGNOSIS — R2689 Other abnormalities of gait and mobility: Secondary | ICD-10-CM | POA: Diagnosis not present

## 2019-03-07 DIAGNOSIS — M25672 Stiffness of left ankle, not elsewhere classified: Secondary | ICD-10-CM | POA: Diagnosis not present

## 2019-03-07 DIAGNOSIS — R2689 Other abnormalities of gait and mobility: Secondary | ICD-10-CM | POA: Diagnosis not present

## 2019-03-07 DIAGNOSIS — M25572 Pain in left ankle and joints of left foot: Secondary | ICD-10-CM | POA: Diagnosis not present

## 2019-03-09 DIAGNOSIS — M25572 Pain in left ankle and joints of left foot: Secondary | ICD-10-CM | POA: Diagnosis not present

## 2019-03-09 DIAGNOSIS — M25672 Stiffness of left ankle, not elsewhere classified: Secondary | ICD-10-CM | POA: Diagnosis not present

## 2019-03-09 DIAGNOSIS — R2689 Other abnormalities of gait and mobility: Secondary | ICD-10-CM | POA: Diagnosis not present

## 2019-03-12 ENCOUNTER — Ambulatory Visit (INDEPENDENT_AMBULATORY_CARE_PROVIDER_SITE_OTHER): Payer: Medicare Other | Admitting: Family Medicine

## 2019-03-12 ENCOUNTER — Other Ambulatory Visit: Payer: Self-pay

## 2019-03-12 ENCOUNTER — Encounter (INDEPENDENT_AMBULATORY_CARE_PROVIDER_SITE_OTHER): Payer: Self-pay | Admitting: Family Medicine

## 2019-03-12 DIAGNOSIS — Z9189 Other specified personal risk factors, not elsewhere classified: Secondary | ICD-10-CM | POA: Diagnosis not present

## 2019-03-12 DIAGNOSIS — Z6839 Body mass index (BMI) 39.0-39.9, adult: Secondary | ICD-10-CM | POA: Diagnosis not present

## 2019-03-12 DIAGNOSIS — R2689 Other abnormalities of gait and mobility: Secondary | ICD-10-CM | POA: Diagnosis not present

## 2019-03-12 DIAGNOSIS — M25572 Pain in left ankle and joints of left foot: Secondary | ICD-10-CM | POA: Diagnosis not present

## 2019-03-12 DIAGNOSIS — R7303 Prediabetes: Secondary | ICD-10-CM

## 2019-03-12 DIAGNOSIS — M25672 Stiffness of left ankle, not elsewhere classified: Secondary | ICD-10-CM | POA: Diagnosis not present

## 2019-03-12 DIAGNOSIS — I480 Paroxysmal atrial fibrillation: Secondary | ICD-10-CM

## 2019-03-13 ENCOUNTER — Encounter (INDEPENDENT_AMBULATORY_CARE_PROVIDER_SITE_OTHER): Payer: Self-pay | Admitting: Family Medicine

## 2019-03-13 ENCOUNTER — Encounter (HOSPITAL_COMMUNITY): Payer: Self-pay

## 2019-03-13 NOTE — Progress Notes (Signed)
Office: [phone]  /  Fax: [phone] TeleHealth Visit:  Christie Atkinson has verbally consented to this TeleHealth visit today. The patient is located at home, the provider is located at the [employer] [facility] office. The participants in this visit include the listed provider and patient. Christie Atkinson was unable to use realtime audiovisual technology today and the telehealth visit was conducted via telephone (16 minutes).   HPI:   Chief Complaint: OBESITY Christie Atkinson is here to discuss her progress with her obesity treatment plan. She is on the Category 3 plan and is following her eating plan approximately 90 % of the time. She states she is doing physical therapy for 60 minutes 2 times per week. Christie Atkinson has been doing physical therapy and is a bit sore. She reports an increase in fluid retention and so she is back on taking regular dose of Lasix, but may be doing 20 mg. She denies being hungry. She states her weight was 240 lbs yesterday.  We were unable to weigh the patient today for this TeleHealth visit. She feels as if she has maintained her weight since her last visit. She has lost 13 lbs since starting treatment with [name].  Pre-Diabetes Christie Atkinson has a diagnosis of pre-diabetes based on her elevated Hgb A1c and was informed this puts her at greater risk of developing diabetes. She notes minimal carbohydrates cravings, and denies GI side effects of metformin. She continues to work on diet and exercise to decrease risk of diabetes. She denies hypoglycemia.  At risk for diabetes Christie Atkinson is at higher than average risk for developing diabetes due to her obesity and pre-diabetes. She currently denies polyuria or polydipsia.  Atrial Fibrillation Christie Atkinson last labs were within normal limits. She is on Diltiazem and is status post ablation.  ASSESSMENT AND PLAN:  No diagnosis found.  PLAN:  Pre-Diabetes Christie Atkinson will continue to work on weight loss, exercise, and decreasing simple  carbohydrates in her diet to help decrease the risk of diabetes. We dicussed metformin including benefits and risks. She was informed that eating too many simple carbohydrates or too many calories at one sitting increases the likelihood of GI side effects. Christie Atkinson agrees to increase metformin to 500 mg PO BID #60 and we will refill for 1 month. Christie Atkinson agrees to follow up with our clinic in 2 weeks as directed to monitor her progress.  Diabetes risk counseling Christie Atkinson was given extended (15 minutes) diabetes prevention counseling today. She is 63 y.o. female and has risk factors for diabetes including obesity and pre-diabetes. We discussed intensive lifestyle modifications today with an emphasis on weight loss as well as increasing exercise and decreasing simple carbohydrates in her diet.  Atrial Fibrillation Christie Atkinson is to follow up with Cardiology in November. Christie Atkinson agrees to follow up with our clinic in 2 weeks.  Obesity Christie Atkinson is currently in the action stage of change. As such, her goal is to continue with weight loss efforts  She has agreed to follow the Category 3 plan Christie Atkinson has been instructed to work up to a goal of 150 minutes of combined cardio and strengthening exercise per week for weight loss and overall health benefits. We discussed the following Behavioral Modification Strategies today: increasing lean protein intake, increasing vegetables and work on meal planning and easy cooking plans, keeping healthy foods in the home, and planning for success We will repeat IC at her next appointment.  Christie Atkinson has agreed to follow up with our clinic in 2 weeks with Dr. Owens Shark. She  was informed of the importance of frequent follow up visits to maximize her success with intensive lifestyle modifications for her multiple health conditions.  ALLERGIES: Allergies  Allergen Reactions  . Hydrocodone-Acetaminophen Nausea And Vomiting  . Other Other (See Comments)    Metals Polyester -  including hospital gowns and sheets - allergic contact dermatitis  . Sulfur Hives    MEDICATIONS: Current Outpatient Medications on File Prior to Visit  Medication Sig Dispense Refill  . acetaminophen (TYLENOL) 325 MG tablet Take 325 mg by mouth every 6 (six) hours as needed for moderate pain or headache.    [name] apixaban (ELIQUIS) 5 MG TABS tablet Take 1 tablet (5 mg total) by mouth 2 (two) times daily. 60 tablet 6  . diltiazem (CARDIZEM CD) 120 MG 24 hr capsule Take 2 tablets in the AM and 1 tablet in the PM 270 capsule 2  . docusate sodium (COLACE) 100 MG capsule Take 1 capsule (100 mg total) by mouth 2 (two) times daily. While taking narcotic pain medicine. 30 capsule 0  . dofetilide (TIKOSYN) 125 MCG capsule Take 1 capsule (125 mcg total) by mouth 2 (two) times daily. 180 capsule 3  . furosemide (LASIX) 40 MG tablet Take 40 mg by mouth.    . Magnesium Gluconate 500 (27 Mg) MG TABS Take 1 tablet (500 mg total) by mouth daily. 30 tablet 0  . metFORMIN (GLUCOPHAGE) 500 MG tablet Take 1 tablet (500 mg total) by mouth daily with breakfast. 30 tablet 0  . Multiple Vitamin (MULTIVITAMIN WITH MINERALS) TABS tablet Take 1 tablet by mouth daily.     . Multiple Vitamins-Minerals (WOMENS MULTIVITAMIN) TABS Take 1 tablet by mouth daily. 90 tablet 0  . ondansetron (ZOFRAN) 4 MG tablet Take 1 tablet (4 mg total) by mouth daily as needed for nausea or vomiting. 30 tablet 0  . potassium chloride SA (K-DUR,KLOR-CON) 20 MEQ tablet Take 1 tablet (20 mEq total) by mouth daily. Take an extra 1/2 tablet by mouth on days when you take the extra fluid pill (furosemide/Lasix).    [name] senna (SENOKOT) 8.6 MG TABS tablet Take 2 tablets (17.2 mg total) by mouth 2 (two) times daily. 30 tablet 0  . Vitamin D, Ergocalciferol, (DRISDOL) 1.25 MG (50000 UT) CAPS capsule Take 1 capsule (50,000 Units total) by mouth every 14 (fourteen) days. TAKE 1 CAPSULE BY MOUTH EVERY 7 DAYS 4 capsule 0   No current facility-administered  medications on file prior to visit.     PAST MEDICAL HISTORY: Past Medical History:  Diagnosis Date  . Asthma   . Atrial fibrillation ([place])   . Back pain   . Diastolic dysfunction   . Fibromyalgia   . Gestational diabetes   . Hypertension   . Knee pain   . Osteoarthritis   . Persistent atrial fibrillation ([place])   . Sleep apnea    wears oral appliance  . Visit for monitoring Tikosyn therapy 02/07/2018    PAST SURGICAL HISTORY: Past Surgical History:  Procedure Laterality Date  . ABDOMINAL HYSTERECTOMY    . ABLATION OF DYSRHYTHMIC FOCUS  03/09/2018  . APPENDECTOMY    . ATRIAL FIBRILLATION ABLATION N/A 03/09/2018   Procedure: ATRIAL FIBRILLATION ABLATION;  Surgeon: [name], MD;  Location: [place] [facility];  Service: Cardiovascular;  Laterality: N/A;  . CARDIOVERSION N/A 07/04/2017   Procedure: CARDIOVERSION;  Surgeon: [name], MD;  Location: [facility] ENDOSCOPY;  Service: Cardiovascular;  Laterality: N/A;  . CARDIOVERSION N/A 09/06/2017   Procedure: CARDIOVERSION;  Surgeon: [name], MD;  Location: [facility] ENDOSCOPY;  Service: Cardiovascular;  Laterality: N/A;  . CHOLECYSTECTOMY N/A 02/24/2015   Procedure: LAPAROSCOPIC CHOLECYSTECTOMY;  Surgeon: [name], MD;  Location: [place];  Service: General;  Laterality: N/A;  . EXCISION HAGLUND'S DEFORMITY WITH ACHILLES TENDON REPAIR Left 12/21/2018   Procedure: Left Achilles Tendon Debridement and Reconstruction; Excision of Haglund Deformity;  Surgeon: [name], MD;  Location: [place];  Service: Orthopedics;  Laterality: Left;  [name] GASTROCNEMIUS RECESSION Left 12/21/2018   Procedure: Left Gastroc Recession;  Surgeon: [name], MD;  Location: [place];  Service: Orthopedics;  Laterality: Left;  . RIGHT/LEFT HEART CATH AND CORONARY ANGIOGRAPHY N/A 08/09/2017   Procedure: RIGHT/LEFT HEART CATH AND CORONARY ANGIOGRAPHY;  Surgeon: [name], MD;  Location: [place] [facility];  Service:  Cardiovascular;  Laterality: N/A;  . TEE WITHOUT CARDIOVERSION N/A 07/04/2017   Procedure: TRANSESOPHAGEAL ECHOCARDIOGRAM (TEE);  Surgeon: [name], MD;  Location: [facility] ENDOSCOPY;  Service: Cardiovascular;  Laterality: N/A;  . TEE WITHOUT CARDIOVERSION N/A 03/09/2018   Procedure: TRANSESOPHAGEAL ECHOCARDIOGRAM (TEE);  Surgeon: [name], MD;  Location: [facility] ENDOSCOPY;  Service: Cardiovascular;  Laterality: N/A;    SOCIAL HISTORY: Social History   Tobacco Use  . Smoking status: Never Smoker  . Smokeless tobacco: Never Used  Substance Use Topics  . Alcohol use: Yes    Comment: little  . Drug use: No    FAMILY HISTORY: Family History  Problem Relation Age of Onset  . Heart disease Mother   . Hypertension Mother   . Hyperlipidemia Mother   . Diabetes Mother   . Sudden death Mother   . Thyroid disease Mother   . Obesity Mother   . Heart disease Father   . Heart attack Father   . Hypertension Father   . Hyperlipidemia Father   . Diabetes Father     ROS: Review of Systems  Constitutional: Negative for weight loss.  Genitourinary: Negative for frequency.  Endo/Heme/Allergies: Negative for polydipsia.       Negative hypoglycemia    PHYSICAL EXAM: Pt in no acute distress  RECENT LABS AND TESTS: BMET    Component Value Date/Time   NA 141 01/24/2019 1313   K 4.5 01/24/2019 1313   CL 103 01/24/2019 1313   CO2 21 01/24/2019 1313   GLUCOSE 99 01/24/2019 1313   GLUCOSE 93 12/14/2018 1500   BUN 16 01/24/2019 1313   CREATININE 0.80 01/24/2019 1313   CALCIUM 9.6 01/24/2019 1313   GFRNONAA 79 01/24/2019 1313   GFRAA 91 01/24/2019 1313   Lab Results  Component Value Date   HGBA1C 5.8 (H) 01/24/2019   HGBA1C 5.8 (H) 09/21/2018   HGBA1C 6.0 (H) 05/17/2018   HGBA1C 5.8 (H) 11/02/2017   HGBA1C 5.8 (H) 06/26/2017   Lab Results  Component Value Date   INSULIN 20.0 01/24/2019   INSULIN 24.4 09/21/2018   INSULIN 16.1 05/17/2018   INSULIN 8.5 11/02/2017   CBC     Component Value Date/Time   WBC 8.5 03/08/2018 1442   WBC 7.4 08/31/2017 0930   RBC 4.46 03/08/2018 1442   RBC 4.63 08/31/2017 0930   HGB 14.0 03/08/2018 1442   HCT 42.2 03/08/2018 1442   PLT 256 03/08/2018 1442   MCV 95 03/08/2018 1442   MCH 31.4 03/08/2018 1442   MCH 30.2 08/31/2017 0930   MCHC 33.2 03/08/2018 1442   MCHC 33.2 08/31/2017 0930   RDW 13.4 03/08/2018 1442  LYMPHSABS 2.5 03/08/2018 1442   MONOABS 1.2 (H) 02/23/2015 0619   EOSABS 0.1 03/08/2018 1442   BASOSABS 0.0 03/08/2018 1442   Iron/TIBC/Ferritin/ %Sat No results found for: IRON, TIBC, FERRITIN, IRONPCTSAT Lipid Panel     Component Value Date/Time   CHOL 160 01/24/2019 1313   TRIG 174 (H) 01/24/2019 1313   HDL 31 (L) 01/24/2019 1313   CHOLHDL 5.4 06/26/2017 0517   VLDL 15 06/26/2017 0517   LDLCALC 94 01/24/2019 1313   Hepatic Function Panel     Component Value Date/Time   PROT 7.4 01/24/2019 1313   ALBUMIN 4.3 01/24/2019 1313   AST 20 01/24/2019 1313   ALT 11 01/24/2019 1313   ALKPHOS 88 01/24/2019 1313   BILITOT 0.3 01/24/2019 1313      Component Value Date/Time   TSH 4.610 (H) 09/21/2018 0951   TSH 3.010 11/02/2017 1254   TSH 4.410 06/24/2017 1046      I, Trixie Dredge, am acting as transcriptionist for Ilene Qua, MD  I have reviewed the above documentation for accuracy and completeness, and I agree with the above. - Ilene Qua, MD

## 2019-03-14 DIAGNOSIS — R2689 Other abnormalities of gait and mobility: Secondary | ICD-10-CM | POA: Diagnosis not present

## 2019-03-14 DIAGNOSIS — M25672 Stiffness of left ankle, not elsewhere classified: Secondary | ICD-10-CM | POA: Diagnosis not present

## 2019-03-14 DIAGNOSIS — M25572 Pain in left ankle and joints of left foot: Secondary | ICD-10-CM | POA: Diagnosis not present

## 2019-03-14 MED ORDER — METFORMIN HCL 500 MG PO TABS: 500.0000 mg | ORAL_TABLET | Freq: Two times a day (BID) | ORAL | 0 refills | Status: DC

## 2019-03-20 DIAGNOSIS — R2689 Other abnormalities of gait and mobility: Secondary | ICD-10-CM | POA: Diagnosis not present

## 2019-03-20 DIAGNOSIS — M25572 Pain in left ankle and joints of left foot: Secondary | ICD-10-CM | POA: Diagnosis not present

## 2019-03-20 DIAGNOSIS — M25672 Stiffness of left ankle, not elsewhere classified: Secondary | ICD-10-CM | POA: Diagnosis not present

## 2019-03-26 DIAGNOSIS — R2689 Other abnormalities of gait and mobility: Secondary | ICD-10-CM | POA: Diagnosis not present

## 2019-03-26 DIAGNOSIS — M25672 Stiffness of left ankle, not elsewhere classified: Secondary | ICD-10-CM | POA: Diagnosis not present

## 2019-03-26 DIAGNOSIS — M25572 Pain in left ankle and joints of left foot: Secondary | ICD-10-CM | POA: Diagnosis not present

## 2019-03-27 ENCOUNTER — Ambulatory Visit (INDEPENDENT_AMBULATORY_CARE_PROVIDER_SITE_OTHER): Payer: Medicare Other | Admitting: Bariatrics

## 2019-03-27 ENCOUNTER — Telehealth (INDEPENDENT_AMBULATORY_CARE_PROVIDER_SITE_OTHER): Payer: Medicare Other | Admitting: Bariatrics

## 2019-03-27 ENCOUNTER — Other Ambulatory Visit: Payer: Self-pay

## 2019-03-27 ENCOUNTER — Encounter: Payer: Self-pay | Admitting: Bariatrics

## 2019-03-27 VITALS — BP 138/76 | HR 66 | Temp 98.0°F | Ht 65.0 in | Wt 238.0 lb

## 2019-03-27 DIAGNOSIS — R7303 Prediabetes: Secondary | ICD-10-CM

## 2019-03-27 DIAGNOSIS — R0602 Shortness of breath: Secondary | ICD-10-CM | POA: Diagnosis not present

## 2019-03-27 DIAGNOSIS — I480 Paroxysmal atrial fibrillation: Secondary | ICD-10-CM

## 2019-03-27 DIAGNOSIS — R5383 Other fatigue: Secondary | ICD-10-CM | POA: Diagnosis not present

## 2019-03-27 DIAGNOSIS — Z6839 Body mass index (BMI) 39.0-39.9, adult: Secondary | ICD-10-CM

## 2019-03-27 DIAGNOSIS — R7989 Other specified abnormal findings of blood chemistry: Secondary | ICD-10-CM

## 2019-03-28 ENCOUNTER — Encounter (INDEPENDENT_AMBULATORY_CARE_PROVIDER_SITE_OTHER): Payer: Self-pay | Admitting: Bariatrics

## 2019-03-28 LAB — T3: T3, Total: 112 ng/dL (ref 71–180)

## 2019-03-28 LAB — TSH: TSH: 2.2 u[IU]/mL (ref 0.450–4.500)

## 2019-03-28 LAB — T4, FREE: Free T4: 1.15 ng/dL (ref 0.82–1.77)

## 2019-03-28 NOTE — Progress Notes (Signed)
Office: [phone]  /  Fax: [phone]   HPI:   Chief Complaint: OBESITY Christie Atkinson is here to discuss her progress with her obesity treatment plan. She is on the Category 3 plan and is following her eating plan approximately 90% of the time. She states she is walking 1 mile 7 times per week. Christie Atkinson is a patient of Dr. Adair Patter and this is our first visit. She is up 2 lbs from her last visit. She is having IC testing today. Her weight is 238 lb (108 kg) today and has had a weight gain of 2 lbs since her last visit. She has lost 11 lbs since starting treatment with [name].  Pre-Diabetes Christie Atkinson has a diagnosis of prediabetes based on her elevated Hgb A1c and was informed this puts her at greater risk of developing diabetes. Last A1c 5.8 on 01/24/2019 with an insulin of 20.0. She is taking metformin currently and continues to work on diet and exercise to decrease risk of diabetes. She denies nausea or hypoglycemia.  Paroxysmal Atrial Fibrillation Christie Atkinson is taking Diltiazem. She is status post ablation.  Fatigue and Shortness of Breath Christie Atkinson is having fatigue and shortness of breath with certain activities. She will have IC testing performed today.  Elevated TSH Christie Atkinson has an elevated TSH. She is having some back pain and fatigue, but overall is feeling good.  ASSESSMENT AND PLAN:  Other fatigue - Plan: T3, T4, free, TSH  Shortness of breath on exertion  Prediabetes  Paroxysmal atrial fibrillation (HCC)  Elevated TSH  Class 2 severe obesity with serious comorbidity and body mass index (BMI) of 39.0 to 39.9 in adult, unspecified obesity type ([place])  PLAN:  Pre-Diabetes Christie Atkinson will continue to work on weight loss, exercise, and decreasing simple carbohydrates in her diet to help decrease the risk of diabetes. We dicussed metformin including benefits and risks. She was informed that eating too many simple carbohydrates or too many calories at one sitting increases the likelihood  of GI side effects. [name] will continue metformin and follow-up as directed.  Paroxysmal Atrial Fibrillation Christie Atkinson will continue medications and follow-up with her cardiologist as directed.  Fatigue and Shortness of Breath Christie Atkinson's shortness of breath appears to be obesity related and exercise induced. The indirect calorimeter results showed VO2 of 331 and a REE of 2306 (2073 on 11/02/2017). She has agreed to work on weight loss and gradually increase exercise to treat her exercise induced shortness of breath. If Christie Atkinson follows our instructions and loses weight without improvement of her shortness of breath, we will plan to refer to pulmonology. Christie Atkinson agrees to this plan.  Elevated TSH Christie Atkinson will have thyroid panel checked and follow-up as directed for review of labs.  Obesity Christie Atkinson is currently in the action stage of change. As such, her goal is to continue with weight loss efforts. She has agreed to follow the Category 3 plan with 100 grams of protein. Christie Atkinson will work on meal planning and intentional eating. Christie Atkinson has been instructed to work up to a goal of 150 minutes of combined cardio and strengthening exercise per week for weight loss and overall health benefits. We discussed the following Behavioral Modification Strategies today: increasing lean protein intake, decreasing simple carbohydrates, increasing vegetables, increase H20 intake, decrease eating out, no skipping meals, work on meal planning and easy cooking plans, keeping healthy foods in the home, ways to avoid boredom eating, and planning for success.  Christie Atkinson has agreed to follow-up with our clinic in 2-3 weeks. She was  informed of the importance of frequent follow-up visits to maximize her success with intensive lifestyle modifications for her multiple health conditions.  ALLERGIES: Allergies  Allergen Reactions  . Hydrocodone-Acetaminophen Nausea And Vomiting  . Other Other (See Comments)    Metals  Polyester - including hospital gowns and sheets - allergic contact dermatitis  . Sulfur Hives    MEDICATIONS: Current Outpatient Medications on File Prior to Visit  Medication Sig Dispense Refill  . acetaminophen (TYLENOL) 325 MG tablet Take 325 mg by mouth every 6 (six) hours as needed for moderate pain or headache.    [name] apixaban (ELIQUIS) 5 MG TABS tablet Take 1 tablet (5 mg total) by mouth 2 (two) times daily. 60 tablet 6  . diltiazem (CARDIZEM CD) 120 MG 24 hr capsule Take 2 tablets in the AM and 1 tablet in the PM 270 capsule 2  . docusate sodium (COLACE) 100 MG capsule Take 1 capsule (100 mg total) by mouth 2 (two) times daily. While taking narcotic pain medicine. 30 capsule 0  . dofetilide (TIKOSYN) 125 MCG capsule Take 1 capsule (125 mcg total) by mouth 2 (two) times daily. 180 capsule 3  . furosemide (LASIX) 40 MG tablet Take 40 mg by mouth.    . Magnesium Gluconate 500 (27 Mg) MG TABS Take 1 tablet (500 mg total) by mouth daily. 30 tablet 0  . metFORMIN (GLUCOPHAGE) 500 MG tablet Take 1 tablet (500 mg total) by mouth 2 (two) times daily with a meal. 180 tablet 0  . Multiple Vitamin (MULTIVITAMIN WITH MINERALS) TABS tablet Take 1 tablet by mouth daily.     . Multiple Vitamins-Minerals (WOMENS MULTIVITAMIN) TABS Take 1 tablet by mouth daily. 90 tablet 0  . ondansetron (ZOFRAN) 4 MG tablet Take 1 tablet (4 mg total) by mouth daily as needed for nausea or vomiting. 30 tablet 0  . potassium chloride SA (K-DUR,KLOR-CON) 20 MEQ tablet Take 1 tablet (20 mEq total) by mouth daily. Take an extra 1/2 tablet by mouth on days when you take the extra fluid pill (furosemide/Lasix).    [name] senna (SENOKOT) 8.6 MG TABS tablet Take 2 tablets (17.2 mg total) by mouth 2 (two) times daily. 30 tablet 0  . Vitamin D, Ergocalciferol, (DRISDOL) 1.25 MG (50000 UT) CAPS capsule Take 1 capsule (50,000 Units total) by mouth every 14 (fourteen) days. TAKE 1 CAPSULE BY MOUTH EVERY 7 DAYS 4 capsule 0   No current  facility-administered medications on file prior to visit.     PAST MEDICAL HISTORY: Past Medical History:  Diagnosis Date  . Asthma   . Atrial fibrillation ([place])   . Back pain   . Diastolic dysfunction   . Fibromyalgia   . Gestational diabetes   . Hypertension   . Knee pain   . Osteoarthritis   . Persistent atrial fibrillation ([place])   . Sleep apnea    wears oral appliance  . Visit for monitoring Tikosyn therapy 02/07/2018    PAST SURGICAL HISTORY: Past Surgical History:  Procedure Laterality Date  . ABDOMINAL HYSTERECTOMY    . ABLATION OF DYSRHYTHMIC FOCUS  03/09/2018  . APPENDECTOMY    . ATRIAL FIBRILLATION ABLATION N/A 03/09/2018   Procedure: ATRIAL FIBRILLATION ABLATION;  Surgeon: [name], MD;  Location: [place] CV LAB;  Service: Cardiovascular;  Laterality: N/A;  . CARDIOVERSION N/A 07/04/2017   Procedure: CARDIOVERSION;  Surgeon: [name], MD;  Location: [facility] ENDOSCOPY;  Service: Cardiovascular;  Laterality: N/A;  . CARDIOVERSION N/A 09/06/2017   Procedure:  CARDIOVERSION;  Surgeon: [name], MD;  Location: [facility] ENDOSCOPY;  Service: Cardiovascular;  Laterality: N/A;  . CHOLECYSTECTOMY N/A 02/24/2015   Procedure: LAPAROSCOPIC CHOLECYSTECTOMY;  Surgeon: [name], MD;  Location: [place];  Service: General;  Laterality: N/A;  . EXCISION HAGLUND'S DEFORMITY WITH ACHILLES TENDON REPAIR Left 12/21/2018   Procedure: Left Achilles Tendon Debridement and Reconstruction; Excision of Haglund Deformity;  Surgeon: [name], MD;  Location: [address];  Service: Orthopedics;  Laterality: Left;  [name] GASTROCNEMIUS RECESSION Left 12/21/2018   Procedure: Left Gastroc Recession;  Surgeon: [name], MD;  Location: [place];  Service: Orthopedics;  Laterality: Left;  . RIGHT/LEFT HEART CATH AND CORONARY ANGIOGRAPHY N/A 08/09/2017   Procedure: RIGHT/LEFT HEART CATH AND CORONARY ANGIOGRAPHY;  Surgeon: [name], MD;  Location: [place] CV  LAB;  Service: Cardiovascular;  Laterality: N/A;  . TEE WITHOUT CARDIOVERSION N/A 07/04/2017   Procedure: TRANSESOPHAGEAL ECHOCARDIOGRAM (TEE);  Surgeon: [name], MD;  Location: [facility] ENDOSCOPY;  Service: Cardiovascular;  Laterality: N/A;  . TEE WITHOUT CARDIOVERSION N/A 03/09/2018   Procedure: TRANSESOPHAGEAL ECHOCARDIOGRAM (TEE);  Surgeon: [name], MD;  Location: [facility] ENDOSCOPY;  Service: Cardiovascular;  Laterality: N/A;    SOCIAL HISTORY: Social History   Tobacco Use  . Smoking status: Never Smoker  . Smokeless tobacco: Never Used  Substance Use Topics  . Alcohol use: Yes    Comment: little  . Drug use: No    FAMILY HISTORY: Family History  Problem Relation Age of Onset  . Heart disease Mother   . Hypertension Mother   . Hyperlipidemia Mother   . Diabetes Mother   . Sudden death Mother   . Thyroid disease Mother   . Obesity Mother   . Heart disease Father   . Heart attack Father   . Hypertension Father   . Hyperlipidemia Father   . Diabetes Father    ROS: Review of Systems  Constitutional: Positive for malaise/fatigue.  Respiratory: Positive for shortness of breath.   Cardiovascular:       Positive for paroxysmal atrial fibrillation.  Gastrointestinal: Negative for nausea.  Musculoskeletal: Positive for back pain.  Endo/Heme/Allergies:       Negative for hypoglycemia.   PHYSICAL EXAM: Blood pressure 138/76, pulse 66, temperature 98 F (36.7 C), temperature source Oral, height 5\' 5"  (1.651 m), weight 238 lb (108 kg), SpO2 97 %. Body mass index is 39.61 kg/m. Physical Exam Vitals signs reviewed.  Constitutional:      Appearance: Normal appearance. She is obese.  Cardiovascular:     Rate and Rhythm: Normal rate.     Pulses: Normal pulses.  Pulmonary:     Effort: Pulmonary effort is normal.     Breath sounds: Normal breath sounds.  Musculoskeletal: Normal range of motion.  Skin:    General: Skin is warm and dry.  Neurological:     Mental  Status: She is alert and oriented to person, place, and time.     Comments: Using a walker for ambulation.  Psychiatric:        Behavior: Behavior normal.   RECENT LABS AND TESTS: BMET    Component Value Date/Time   NA 141 01/24/2019 1313   K 4.5 01/24/2019 1313   CL 103 01/24/2019 1313   CO2 21 01/24/2019 1313   GLUCOSE 99 01/24/2019 1313   GLUCOSE 93 12/14/2018 1500   BUN 16 01/24/2019 1313   CREATININE 0.80 01/24/2019 1313   CALCIUM 9.6 01/24/2019 1313  GFRNONAA 79 01/24/2019 1313   GFRAA 91 01/24/2019 1313   Lab Results  Component Value Date   HGBA1C 5.8 (H) 01/24/2019   HGBA1C 5.8 (H) 09/21/2018   HGBA1C 6.0 (H) 05/17/2018   HGBA1C 5.8 (H) 11/02/2017   HGBA1C 5.8 (H) 06/26/2017   Lab Results  Component Value Date   INSULIN 20.0 01/24/2019   INSULIN 24.4 09/21/2018   INSULIN 16.1 05/17/2018   INSULIN 8.5 11/02/2017   CBC    Component Value Date/Time   WBC 8.5 03/08/2018 1442   WBC 7.4 08/31/2017 0930   RBC 4.46 03/08/2018 1442   RBC 4.63 08/31/2017 0930   HGB 14.0 03/08/2018 1442   HCT 42.2 03/08/2018 1442   PLT 256 03/08/2018 1442   MCV 95 03/08/2018 1442   MCH 31.4 03/08/2018 1442   MCH 30.2 08/31/2017 0930   MCHC 33.2 03/08/2018 1442   MCHC 33.2 08/31/2017 0930   RDW 13.4 03/08/2018 1442   LYMPHSABS 2.5 03/08/2018 1442   MONOABS 1.2 (H) 02/23/2015 0619   EOSABS 0.1 03/08/2018 1442   BASOSABS 0.0 03/08/2018 1442   Iron/TIBC/Ferritin/ %Sat No results found for: IRON, TIBC, FERRITIN, IRONPCTSAT Lipid Panel     Component Value Date/Time   CHOL 160 01/24/2019 1313   TRIG 174 (H) 01/24/2019 1313   HDL 31 (L) 01/24/2019 1313   CHOLHDL 5.4 06/26/2017 0517   VLDL 15 06/26/2017 0517   LDLCALC 94 01/24/2019 1313   Hepatic Function Panel     Component Value Date/Time   PROT 7.4 01/24/2019 1313   ALBUMIN 4.3 01/24/2019 1313   AST 20 01/24/2019 1313   ALT 11 01/24/2019 1313   ALKPHOS 88 01/24/2019 1313   BILITOT 0.3 01/24/2019 1313       Component Value Date/Time   TSH 2.200 03/27/2019 1035   TSH 4.610 (H) 09/21/2018 0951   TSH 3.010 11/02/2017 1254   Results for AHNYA, SATURDAY (MRN [id number]) as of 03/28/2019 11:23  Ref. Range 01/24/2019 13:13  Vitamin D, 25-Hydroxy Latest Ref Range: 30.0 - 100.0 ng/mL 61.0   OBESITY BEHAVIORAL INTERVENTION VISIT  Today's visit was #28  Starting weight: 249 lbs Starting date: 11/02/2017 Today's weight: 238 lbs  Today's date: 03/27/2019 Total lbs lost to date: 11 At least 15 minutes were spent on discussing the following behavioral intervention visit.    03/27/2019  Height 5\' 5"  (1.651 m)  Weight 238 lb (108 kg)  BMI (Calculated) 39.61  BLOOD PRESSURE - SYSTOLIC 0000000  BLOOD PRESSURE - DIASTOLIC 76   Body Fat % 123456 %  Total Body Water (lbs) 90.6 lbs  RMR 2306   ASK: We discussed the diagnosis of obesity with Christie Atkinson today and Christie Atkinson agreed to give [name] permission to discuss obesity behavioral modification therapy today.  ASSESS: Saylee has the diagnosis of obesity and her BMI today is 39.6. Lismary is in the action stage of change.   ADVISE: Christie Atkinson was educated on the multiple health risks of obesity as well as the benefit of weight loss to improve her health. She was advised of the need for long term treatment and the importance of lifestyle modifications to improve her current health and to decrease her risk of future health problems.  AGREE: Multiple dietary modification options and treatment options were discussed and  Christie Atkinson agreed to follow the recommendations documented in the above note.  ARRANGE: Christie Atkinson was educated on the importance of frequent visits to treat obesity as outlined per CMS and USPSTF guidelines and agreed  to schedule her next follow up appointment today.  Migdalia Dk, am acting as Location manager for [employer], DO  I have reviewed the above documentation for accuracy and completeness, and I agree with the above. -Jearld Lesch, DO

## 2019-03-30 DIAGNOSIS — M25672 Stiffness of left ankle, not elsewhere classified: Secondary | ICD-10-CM | POA: Diagnosis not present

## 2019-03-30 DIAGNOSIS — M25572 Pain in left ankle and joints of left foot: Secondary | ICD-10-CM | POA: Diagnosis not present

## 2019-03-30 DIAGNOSIS — R2689 Other abnormalities of gait and mobility: Secondary | ICD-10-CM | POA: Diagnosis not present

## 2019-04-06 DIAGNOSIS — M25672 Stiffness of left ankle, not elsewhere classified: Secondary | ICD-10-CM | POA: Diagnosis not present

## 2019-04-06 DIAGNOSIS — R2689 Other abnormalities of gait and mobility: Secondary | ICD-10-CM | POA: Diagnosis not present

## 2019-04-06 DIAGNOSIS — M25572 Pain in left ankle and joints of left foot: Secondary | ICD-10-CM | POA: Diagnosis not present

## 2019-04-13 DIAGNOSIS — M25672 Stiffness of left ankle, not elsewhere classified: Secondary | ICD-10-CM | POA: Diagnosis not present

## 2019-04-13 DIAGNOSIS — R2689 Other abnormalities of gait and mobility: Secondary | ICD-10-CM | POA: Diagnosis not present

## 2019-04-13 DIAGNOSIS — M25572 Pain in left ankle and joints of left foot: Secondary | ICD-10-CM | POA: Diagnosis not present

## 2019-04-16 DIAGNOSIS — Z09 Encounter for follow-up examination after completed treatment for conditions other than malignant neoplasm: Secondary | ICD-10-CM | POA: Diagnosis not present

## 2019-04-16 DIAGNOSIS — M67872 Other specified disorders of synovium, left ankle and foot: Secondary | ICD-10-CM | POA: Diagnosis not present

## 2019-04-17 DIAGNOSIS — R2689 Other abnormalities of gait and mobility: Secondary | ICD-10-CM | POA: Diagnosis not present

## 2019-04-17 DIAGNOSIS — M25672 Stiffness of left ankle, not elsewhere classified: Secondary | ICD-10-CM | POA: Diagnosis not present

## 2019-04-17 DIAGNOSIS — M25572 Pain in left ankle and joints of left foot: Secondary | ICD-10-CM | POA: Diagnosis not present

## 2019-04-19 ENCOUNTER — Other Ambulatory Visit: Payer: Self-pay

## 2019-04-19 ENCOUNTER — Encounter (INDEPENDENT_AMBULATORY_CARE_PROVIDER_SITE_OTHER): Payer: Self-pay | Admitting: Bariatrics

## 2019-04-19 ENCOUNTER — Ambulatory Visit (INDEPENDENT_AMBULATORY_CARE_PROVIDER_SITE_OTHER): Payer: Medicare Other | Admitting: Bariatrics

## 2019-04-19 ENCOUNTER — Ambulatory Visit (HOSPITAL_COMMUNITY)
Admission: RE | Admit: 2019-04-19 | Discharge: 2019-04-19 | Disposition: A | Discharge status: Home / Self Care | Payer: Medicare Other | Source: Ambulatory Visit | Attending: Nurse Practitioner | Admitting: Nurse Practitioner

## 2019-04-19 ENCOUNTER — Encounter (HOSPITAL_COMMUNITY): Payer: Self-pay | Admitting: Nurse Practitioner

## 2019-04-19 VITALS — BP 130/80 | HR 69 | Ht 65.0 in | Wt 242.6 lb

## 2019-04-19 VITALS — BP 110/64 | HR 69 | Temp 98.3°F | Ht 65.0 in | Wt 239.0 lb

## 2019-04-19 DIAGNOSIS — Z8249 Family history of ischemic heart disease and other diseases of the circulatory system: Secondary | ICD-10-CM | POA: Insufficient documentation

## 2019-04-19 DIAGNOSIS — I4819 Other persistent atrial fibrillation: Secondary | ICD-10-CM

## 2019-04-19 DIAGNOSIS — Z7901 Long term (current) use of anticoagulants: Secondary | ICD-10-CM | POA: Insufficient documentation

## 2019-04-19 DIAGNOSIS — R7989 Other specified abnormal findings of blood chemistry: Secondary | ICD-10-CM

## 2019-04-19 DIAGNOSIS — Z79899 Other long term (current) drug therapy: Secondary | ICD-10-CM | POA: Diagnosis not present

## 2019-04-19 DIAGNOSIS — Z6839 Body mass index (BMI) 39.0-39.9, adult: Secondary | ICD-10-CM

## 2019-04-19 DIAGNOSIS — J45909 Unspecified asthma, uncomplicated: Secondary | ICD-10-CM | POA: Insufficient documentation

## 2019-04-19 DIAGNOSIS — R7303 Prediabetes: Secondary | ICD-10-CM

## 2019-04-19 DIAGNOSIS — M797 Fibromyalgia: Secondary | ICD-10-CM | POA: Insufficient documentation

## 2019-04-19 DIAGNOSIS — M810 Age-related osteoporosis without current pathological fracture: Secondary | ICD-10-CM | POA: Diagnosis not present

## 2019-04-19 DIAGNOSIS — E669 Obesity, unspecified: Secondary | ICD-10-CM | POA: Insufficient documentation

## 2019-04-19 DIAGNOSIS — G4730 Sleep apnea, unspecified: Secondary | ICD-10-CM | POA: Diagnosis not present

## 2019-04-19 DIAGNOSIS — I10 Essential (primary) hypertension: Secondary | ICD-10-CM | POA: Diagnosis not present

## 2019-04-19 DIAGNOSIS — G4733 Obstructive sleep apnea (adult) (pediatric): Secondary | ICD-10-CM | POA: Insufficient documentation

## 2019-04-19 DIAGNOSIS — Z6841 Body Mass Index (BMI) 40.0 and over, adult: Secondary | ICD-10-CM | POA: Diagnosis not present

## 2019-04-19 DIAGNOSIS — D6869 Other thrombophilia: Secondary | ICD-10-CM | POA: Diagnosis not present

## 2019-04-19 LAB — BASIC METABOLIC PANEL
Anion gap: 12 (ref 5–15)
BUN: 19 mg/dL (ref 8–23)
CO2: 23 mmol/L (ref 22–32)
Calcium: 9.3 mg/dL (ref 8.9–10.3)
Chloride: 102 mmol/L (ref 98–111)
Creatinine, Ser: 0.83 mg/dL (ref 0.44–1.00)
GFR calc Af Amer: >60 mL/min (ref >60)
GFR calc non Af Amer: >60 mL/min (ref >60)
Glucose, Bld: 108 mg/dL — ABNORMAL HIGH (ref 70–99)
Potassium: 4.3 mmol/L (ref 3.5–5.1)
Sodium: 137 mmol/L (ref 135–145)

## 2019-04-19 LAB — MAGNESIUM: Magnesium: 2.4 mg/dL (ref 1.7–2.4)

## 2019-04-19 NOTE — Progress Notes (Signed)
Primary Care Physician: Christie Char, FNP Referring Physician:Dr. [name] is a 63 y.o. female with a h/o persistent afib that was having  breakthrough afib with Tikosyn and had an afib ablation 03/09/18 with [name]. She also had foot surgery last summer and had more paroxysmal afib during that time.  The afib has calmed down now that her foot surgery stress has resolved. She remains  on eliquis 5 mg bid with a CHA2DS2VASc score of 3( HF, female, pre -diabetes)  Today, she denies symptoms of palpitations, chest pain, shortness of breath, orthopnea, PND, lower extremity edema, dizziness, presyncope, syncope, or neurologic sequela. The patient is tolerating medications without difficulties and is otherwise without complaint today.   Past Medical History:  Diagnosis Date  . Asthma   . Atrial fibrillation ([place])   . Back pain   . Diastolic dysfunction   . Fibromyalgia   . Gestational diabetes   . Hypertension   . Knee pain   . Osteoarthritis   . Persistent atrial fibrillation ([place])   . Sleep apnea    wears oral appliance  . Visit for monitoring Tikosyn therapy 02/07/2018   Past Surgical History:  Procedure Laterality Date  . ABDOMINAL HYSTERECTOMY    . ABLATION OF DYSRHYTHMIC FOCUS  03/09/2018  . APPENDECTOMY    . ATRIAL FIBRILLATION ABLATION N/A 03/09/2018   Procedure: ATRIAL FIBRILLATION ABLATION;  Surgeon: [name], MD;  Location: [place] CV LAB;  Service: Cardiovascular;  Laterality: N/A;  . CARDIOVERSION N/A 07/04/2017   Procedure: CARDIOVERSION;  Surgeon: [name], MD;  Location: [facility] ENDOSCOPY;  Service: Cardiovascular;  Laterality: N/A;  . CARDIOVERSION N/A 09/06/2017   Procedure: CARDIOVERSION;  Surgeon: [name], MD;  Location: [facility] ENDOSCOPY;  Service: Cardiovascular;  Laterality: N/A;  . CHOLECYSTECTOMY N/A 02/24/2015   Procedure: LAPAROSCOPIC CHOLECYSTECTOMY;  Surgeon: [name], MD;  Location: [place];  Service: General;   Laterality: N/A;  . EXCISION HAGLUND'S DEFORMITY WITH ACHILLES TENDON REPAIR Left 12/21/2018   Procedure: Left Achilles Tendon Debridement and Reconstruction; Excision of Haglund Deformity;  Surgeon: [name], MD;  Location: [place];  Service: Orthopedics;  Laterality: Left;  [name] GASTROCNEMIUS RECESSION Left 12/21/2018   Procedure: Left Gastroc Recession;  Surgeon: [name], MD;  Location: [place];  Service: Orthopedics;  Laterality: Left;  . RIGHT/LEFT HEART CATH AND CORONARY ANGIOGRAPHY N/A 08/09/2017   Procedure: RIGHT/LEFT HEART CATH AND CORONARY ANGIOGRAPHY;  Surgeon: [name], MD;  Location: [place] CV LAB;  Service: Cardiovascular;  Laterality: N/A;  . TEE WITHOUT CARDIOVERSION N/A 07/04/2017   Procedure: TRANSESOPHAGEAL ECHOCARDIOGRAM (TEE);  Surgeon: [name], MD;  Location: [facility] ENDOSCOPY;  Service: Cardiovascular;  Laterality: N/A;  . TEE WITHOUT CARDIOVERSION N/A 03/09/2018   Procedure: TRANSESOPHAGEAL ECHOCARDIOGRAM (TEE);  Surgeon: [name], MD;  Location: [facility] ENDOSCOPY;  Service: Cardiovascular;  Laterality: N/A;    Current Outpatient Medications  Medication Sig Dispense Refill  . acetaminophen (TYLENOL) 325 MG tablet Take 325 mg by mouth every 6 (six) hours as needed for moderate pain or headache.    [name] apixaban (ELIQUIS) 5 MG TABS tablet Take 1 tablet (5 mg total) by mouth 2 (two) times daily. 60 tablet 6  . diltiazem (CARDIZEM CD) 120 MG 24 hr capsule Take 2 tablets in the AM and 1 tablet in the PM 270 capsule 2  . dofetilide (TIKOSYN) 125 MCG capsule Take 1 capsule (125 mcg total) by mouth 2 (  two) times daily. 180 capsule 3  . furosemide (LASIX) 40 MG tablet Take 40 mg by mouth.    . Magnesium Gluconate 500 (27 Mg) MG TABS Take 1 tablet (500 mg total) by mouth daily. 30 tablet 0  . metFORMIN (GLUCOPHAGE) 500 MG tablet Take 1 tablet (500 mg total) by mouth 2 (two) times daily with a meal. 180 tablet 0  . Multiple  Vitamins-Minerals (WOMENS MULTIVITAMIN) TABS Take 1 tablet by mouth daily. 90 tablet 0  . potassium chloride SA (K-DUR,KLOR-CON) 20 MEQ tablet Take 1 tablet (20 mEq total) by mouth daily. Take an extra 1/2 tablet by mouth on days when you take the extra fluid pill (furosemide/Lasix).    . Vitamin D, Ergocalciferol, (DRISDOL) 1.25 MG (50000 UT) CAPS capsule Take 1 capsule (50,000 Units total) by mouth every 14 (fourteen) days. TAKE 1 CAPSULE BY MOUTH EVERY 7 DAYS 4 capsule 0   No current facility-administered medications for this encounter.     Allergies  Allergen Reactions  . Hydrocodone-Acetaminophen Nausea And Vomiting  . Other Other (See Comments)    Metals Polyester - including hospital gowns and sheets - allergic contact dermatitis  . Sulfur Hives    Social History   Socioeconomic History  . Marital status: Married    Spouse name: Not on file  . Number of children: 4  . Years of education: Not on file  . Highest education level: Not on file  Occupational History  . Occupation: Disabled/retired  Social Needs  . Financial resource strain: Not on file  . Food insecurity    Worry: Not on file    Inability: Not on file  . Transportation needs    Medical: Not on file    Non-medical: Not on file  Tobacco Use  . Smoking status: Never Smoker  . Smokeless tobacco: Never Used  Substance and Sexual Activity  . Alcohol use: Yes    Comment: little  . Drug use: No  . Sexual activity: Not Currently  Lifestyle  . Physical activity    Days per week: Not on file    Minutes per session: Not on file  . Stress: Not on file  Relationships  . Social Herbalist on phone: Not on file    Gets together: Not on file    Attends religious service: Not on file    Active member of club or organization: Not on file    Attends meetings of clubs or organizations: Not on file    Relationship status: Not on file  . Intimate partner violence    Fear of current or ex partner: Not on  file    Emotionally abused: Not on file    Physically abused: Not on file    Forced sexual activity: Not on file  Other Topics Concern  . Not on file  Social History Narrative   Lives in [place]   Not working    Family History  Problem Relation Age of Onset  . Heart disease Mother   . Hypertension Mother   . Hyperlipidemia Mother   . Diabetes Mother   . Sudden death Mother   . Thyroid disease Mother   . Obesity Mother   . Heart disease Father   . Heart attack Father   . Hypertension Father   . Hyperlipidemia Father   . Diabetes Father     ROS- All systems are reviewed and negative except as per the HPI above  Physical Exam: Vitals:   04/19/19  1125  BP: 130/80  Pulse: 69  Weight: 110 kg  Height: 5\' 5"  (1.651 m)   Wt Readings from Last 3 Encounters:  04/19/19 110 kg  03/27/19 108 kg  01/24/19 107 kg    Labs: Lab Results  Component Value Date   NA 141 01/24/2019   K 4.5 01/24/2019   CL 103 01/24/2019   CO2 21 01/24/2019   GLUCOSE 99 01/24/2019   BUN 16 01/24/2019   CREATININE 0.80 01/24/2019   CALCIUM 9.6 01/24/2019   MG 2.3 12/14/2018   Lab Results  Component Value Date   INR 1.07 08/09/2017   Lab Results  Component Value Date   CHOL 160 01/24/2019   HDL 31 (L) 01/24/2019   LDLCALC 94 01/24/2019   TRIG 174 (H) 01/24/2019    GEN- The patient is well appearing obese female, alert and oriented x 3 today.   HEENT-head normocephalic, atraumatic, sclera clear, conjunctiva pink, hearing intact, trachea midline. Lungs- Clear to ausculation bilaterally, normal work of breathing Heart- Regular rate and rhythm, no murmurs, rubs or gallops  GI- soft, NT, ND, + BS Extremities- no clubbing, cyanosis, or edema MS- no significant deformity or atrophy Skin- no rash or lesion Psych- euthymic mood, full affect Neuro- strength and sensation are intact   EKG- SR with  HR of  69, PR 176, QRS 84, QTc 450 ms Echo- 12/2018-1. The left ventricle has normal  systolic function with an ejection fraction of 60-65%. The cavity size was normal. Left ventricular diastolic parameters were normal.  2. The right ventricle has normal systolic function. The cavity was normal. There is no increase in right ventricular wall thickness.  3. No evidence of mitral valve stenosis.  4. No stenosis of the aortic valve.  5. The aortic root and ascending aorta are normal in size and structure.  6. The average left ventricular global longitudinal strain is -23.7 %.  7. The interatrial septum was not assessed. Epic records reviewed   Assessment and Plan:  1. Persistent Atrial fibrillation Doing well s/p ablation 03/09/18  Continue  Tikosyn stable qtc. Continue diltiazem daily Continue Eliquis 5 mg BID for CHA2DS2VASc score of 3. Bmet/mag today  2. Systolic dysfunction EF [id number] on TEE Echo done in July showed normalization of EF  3. Obesity Body mass index is 40.37 kg/m. Lifestyle modification was discussed and encouraged including regular physical activity and weight reduction.  4. OSA Now using oral appliance from Dr Ron Parker. Encouraged compliance.   Follow up with AF clinic in 4 months.  Geroge Baseman Emmely Bittinger, [facility] [address] [place], [place] [address] [phone]

## 2019-04-23 NOTE — Progress Notes (Signed)
Office: [phone]  /  Fax: [phone]   HPI:   Chief Complaint: OBESITY Christie Atkinson is here to discuss her progress with her obesity treatment plan. She is on the Category 3 plan + 100 grams of protein and is following her eating plan approximately 90 % of the time. She states she is walking for 60 minutes 5 times per week, and yard work for 30 minutes 2 times per week. Christie Atkinson is up 1 lb, but she has struggled overall. She states it has been hard to get in enough protein.  Her weight is 239 lb (108.4 kg) today and has gained 1 lb since her last visit. She has lost 10 lbs since starting treatment with [name].  Pre-Diabetes Christie Atkinson has a diagnosis of pre-diabetes based on her elevated Hgb A1c and was informed this puts her at greater risk of developing diabetes. Last A1c was 5.8 and insulin of 20.0. She is doing ok on metformin and continues to work on diet and exercise to decrease risk of diabetes. She denies nausea or hypoglycemia.  Elevated TSH Christie Atkinson thyroid panel was done on 03/27/2019, and results were normal.  ASSESSMENT AND PLAN:  Prediabetes  Elevated TSH  Class 2 severe obesity with serious comorbidity and body mass index (BMI) of 39.0 to 39.9 in adult, unspecified obesity type (Christie Atkinson)  PLAN:  Pre-Diabetes Christie Atkinson will continue to work on weight loss, exercise, increase protein, and decreasing simple carbohydrates in her diet to help decrease the risk of diabetes. We dicussed metformin including benefits and risks. She was informed that eating too many simple carbohydrates or too many calories at one sitting increases the likelihood of GI side effects. Christie Atkinson agrees to continue taking metformin, and she agrees to follow up with [name] as directed to monitor her progress.  Elevated TSH Christie Atkinson will now follow up every year until symptoms of thyroid issues are resolved.  Obesity Christie Atkinson is currently in the action stage of change. As such, her goal is to continue with weight  loss efforts She has agreed to follow the Category 3 plan + 100 grams of protein Christie Atkinson has been instructed to work up to a goal of 150 minutes of combined cardio and strengthening exercise per week or increase walking and stretches at home for weight loss and overall health benefits. We discussed the following Behavioral Modification Strategies today: increasing lean protein intake, decreasing simple carbohydrates , increasing vegetables, decrease eating out and work on meal planning and easy cooking plans, increase H20 intake, no skipping meals, and keeping healthy foods in the home Thanksgiving handout was given.  Christie Atkinson has agreed to follow up with our clinic in 2 to 3 weeks. She was informed of the importance of frequent follow up visits to maximize her success with intensive lifestyle modifications for her multiple health conditions.  ALLERGIES: Allergies  Allergen Reactions   Hydrocodone-Acetaminophen Nausea And Vomiting   Other Other (See Comments)    Metals Polyester - including hospital gowns and sheets - allergic contact dermatitis   Sulfur Hives    MEDICATIONS: Current Outpatient Medications on File Prior to Visit  Medication Sig Dispense Refill   acetaminophen (TYLENOL) 325 MG tablet Take 325 mg by mouth every 6 (six) hours as needed for moderate pain or headache.     apixaban (ELIQUIS) 5 MG TABS tablet Take 1 tablet (5 mg total) by mouth 2 (two) times daily. 60 tablet 6   diltiazem (CARDIZEM CD) 120 MG 24 hr capsule Take 2 tablets in the AM and  1 tablet in the PM 270 capsule 2   dofetilide (TIKOSYN) 125 MCG capsule Take 1 capsule (125 mcg total) by mouth 2 (two) times daily. 180 capsule 3   furosemide (LASIX) 40 MG tablet Take 40 mg by mouth.     Magnesium Gluconate 500 (27 Mg) MG TABS Take 1 tablet (500 mg total) by mouth daily. 30 tablet 0   metFORMIN (GLUCOPHAGE) 500 MG tablet Take 1 tablet (500 mg total) by mouth 2 (two) times daily with a meal. 180 tablet 0    Multiple Vitamins-Minerals (WOMENS MULTIVITAMIN) TABS Take 1 tablet by mouth daily. 90 tablet 0   potassium chloride SA (K-DUR,KLOR-CON) 20 MEQ tablet Take 1 tablet (20 mEq total) by mouth daily. Take an extra 1/2 tablet by mouth on days when you take the extra fluid pill (furosemide/Lasix).     Vitamin D, Ergocalciferol, (DRISDOL) 1.25 MG (50000 UT) CAPS capsule Take 1 capsule (50,000 Units total) by mouth every 14 (fourteen) days. TAKE 1 CAPSULE BY MOUTH EVERY 7 DAYS 4 capsule 0   No current facility-administered medications on file prior to visit.     PAST MEDICAL HISTORY: Past Medical History:  Diagnosis Date   Asthma    Atrial fibrillation (HCC)    Back pain    Diastolic dysfunction    Fibromyalgia    Gestational diabetes    Hypertension    Knee pain    Osteoarthritis    Persistent atrial fibrillation (HCC)    Sleep apnea    wears oral appliance   Visit for monitoring Tikosyn therapy 02/07/2018    PAST SURGICAL HISTORY: Past Surgical History:  Procedure Laterality Date   ABDOMINAL HYSTERECTOMY     ABLATION OF DYSRHYTHMIC FOCUS  03/09/2018   APPENDECTOMY     ATRIAL FIBRILLATION ABLATION N/A 03/09/2018   Procedure: ATRIAL FIBRILLATION ABLATION;  Surgeon: [name], MD;  Location: [place] [facility];  Service: Cardiovascular;  Laterality: N/A;   CARDIOVERSION N/A 07/04/2017   Procedure: CARDIOVERSION;  Surgeon: [name], MD;  Location: [facility] ENDOSCOPY;  Service: Cardiovascular;  Laterality: N/A;   CARDIOVERSION N/A 09/06/2017   Procedure: CARDIOVERSION;  Surgeon: [name], MD;  Location: [facility] ENDOSCOPY;  Service: Cardiovascular;  Laterality: N/A;   CHOLECYSTECTOMY N/A 02/24/2015   Procedure: LAPAROSCOPIC CHOLECYSTECTOMY;  Surgeon: [name], MD;  Location: [place];  Service: General;  Laterality: N/A;   EXCISION HAGLUND'S DEFORMITY WITH ACHILLES TENDON REPAIR Left 12/21/2018   Procedure: Left Achilles Tendon Debridement and Reconstruction;  Excision of Haglund Deformity;  Surgeon: [name], MD;  Location: [place];  Service: Orthopedics;  Laterality: Left;   GASTROCNEMIUS RECESSION Left 12/21/2018   Procedure: Left Gastroc Recession;  Surgeon: [name], MD;  Location: [place];  Service: Orthopedics;  Laterality: Left;   RIGHT/LEFT HEART CATH AND CORONARY ANGIOGRAPHY N/A 08/09/2017   Procedure: RIGHT/LEFT HEART CATH AND CORONARY ANGIOGRAPHY;  Surgeon: [name], MD;  Location: [place] [facility];  Service: Cardiovascular;  Laterality: N/A;   TEE WITHOUT CARDIOVERSION N/A 07/04/2017   Procedure: TRANSESOPHAGEAL ECHOCARDIOGRAM (TEE);  Surgeon: [name], MD;  Location: [facility] ENDOSCOPY;  Service: Cardiovascular;  Laterality: N/A;   TEE WITHOUT CARDIOVERSION N/A 03/09/2018   Procedure: TRANSESOPHAGEAL ECHOCARDIOGRAM (TEE);  Surgeon: [name], MD;  Location: [facility] ENDOSCOPY;  Service: Cardiovascular;  Laterality: N/A;    SOCIAL HISTORY: Social History   Tobacco Use   Smoking status: Never Smoker   Smokeless tobacco: Never Used  Substance Use Topics  Alcohol use: Yes    Comment: little   Drug use: No    FAMILY HISTORY: Family History  Problem Relation Age of Onset   Heart disease Mother    Hypertension Mother    Hyperlipidemia Mother    Diabetes Mother    Sudden death Mother    Thyroid disease Mother    Obesity Mother    Heart disease Father    Heart attack Father    Hypertension Father    Hyperlipidemia Father    Diabetes Father     ROS: Review of Systems  Constitutional: Negative for weight loss.  Gastrointestinal: Negative for nausea.  Endo/Heme/Allergies:       Negative hypoglycemia    PHYSICAL EXAM: Blood pressure 110/64, pulse 69, temperature 98.3 F (36.8 C), height 5\' 5"  (1.651 m), weight 238 lb (108 kg), SpO2 97 %. Body mass index is 39.61 kg/m. Physical Exam Vitals signs reviewed.  Constitutional:      Appearance: Normal  appearance. She is obese.  Cardiovascular:     Rate and Rhythm: Normal rate.     Pulses: Normal pulses.  Pulmonary:     Effort: Pulmonary effort is normal.     Breath sounds: Normal breath sounds.  Musculoskeletal: Normal range of motion.  Skin:    General: Skin is warm and dry.  Neurological:     Mental Status: She is alert and oriented to person, place, and time.  Psychiatric:        Mood and Affect: Mood normal.        Behavior: Behavior normal.     RECENT LABS AND TESTS: BMET    Component Value Date/Time   NA 137 04/19/2019 1154   NA 141 01/24/2019 1313   K 4.3 04/19/2019 1154   CL 102 04/19/2019 1154   CO2 23 04/19/2019 1154   GLUCOSE 108 (H) 04/19/2019 1154   BUN 19 04/19/2019 1154   BUN 16 01/24/2019 1313   CREATININE 0.83 04/19/2019 1154   CALCIUM 9.3 04/19/2019 1154   GFRNONAA >60 04/19/2019 1154   GFRAA >60 04/19/2019 1154   Lab Results  Component Value Date   HGBA1C 5.8 (H) 01/24/2019   HGBA1C 5.8 (H) 09/21/2018   HGBA1C 6.0 (H) 05/17/2018   HGBA1C 5.8 (H) 11/02/2017   HGBA1C 5.8 (H) 06/26/2017   Lab Results  Component Value Date   INSULIN 20.0 01/24/2019   INSULIN 24.4 09/21/2018   INSULIN 16.1 05/17/2018   INSULIN 8.5 11/02/2017   CBC    Component Value Date/Time   WBC 8.5 03/08/2018 1442   WBC 7.4 08/31/2017 0930   RBC 4.46 03/08/2018 1442   RBC 4.63 08/31/2017 0930   HGB 14.0 03/08/2018 1442   HCT 42.2 03/08/2018 1442   PLT 256 03/08/2018 1442   MCV 95 03/08/2018 1442   MCH 31.4 03/08/2018 1442   MCH 30.2 08/31/2017 0930   MCHC 33.2 03/08/2018 1442   MCHC 33.2 08/31/2017 0930   RDW 13.4 03/08/2018 1442   LYMPHSABS 2.5 03/08/2018 1442   MONOABS 1.2 (H) 02/23/2015 0619   EOSABS 0.1 03/08/2018 1442   BASOSABS 0.0 03/08/2018 1442   Iron/TIBC/Ferritin/ %Sat No results found for: IRON, TIBC, FERRITIN, IRONPCTSAT Lipid Panel     Component Value Date/Time   CHOL 160 01/24/2019 1313   TRIG 174 (H) 01/24/2019 1313   HDL 31 (L)  01/24/2019 1313   CHOLHDL 5.4 06/26/2017 0517   VLDL 15 06/26/2017 0517   LDLCALC 94 01/24/2019 1313   Hepatic Function Panel  Component Value Date/Time   PROT 7.4 01/24/2019 1313   ALBUMIN 4.3 01/24/2019 1313   AST 20 01/24/2019 1313   ALT 11 01/24/2019 1313   ALKPHOS 88 01/24/2019 1313   BILITOT 0.3 01/24/2019 1313      Component Value Date/Time   TSH 2.200 03/27/2019 1035   TSH 4.610 (H) 09/21/2018 0951   TSH 3.010 11/02/2017 1254      OBESITY BEHAVIORAL INTERVENTION VISIT  Today's visit was # 29   Starting weight: 249 lbs Starting date: 11/02/17 Today's weight : 239 lbs Today's date: 04/19/2019 Total lbs lost to date: 10    ASK: We discussed the diagnosis of obesity with Christie Atkinson today and Christie Atkinson agreed to give [name] permission to discuss obesity behavioral modification therapy today.  ASSESS: Christie Atkinson has the diagnosis of obesity and her BMI today is 39.77 Christie Atkinson is in the action stage of change   ADVISE: Christie Atkinson was educated on the multiple health risks of obesity as well as the benefit of weight loss to improve her health. She was advised of the need for long term treatment and the importance of lifestyle modifications to improve her current health and to decrease her risk of future health problems.  AGREE: Multiple dietary modification options and treatment options were discussed and  Christie Atkinson agreed to follow the recommendations documented in the above note.  ARRANGE: Shamya was educated on the importance of frequent visits to treat obesity as outlined per CMS and USPSTF guidelines and agreed to schedule her next follow up appointment today.  Wilhemena Durie, am acting as transcriptionist for [employer], DO  I have reviewed the above documentation for accuracy and completeness, and I agree with the above. -Jearld Lesch, DO

## 2019-04-24 ENCOUNTER — Encounter (INDEPENDENT_AMBULATORY_CARE_PROVIDER_SITE_OTHER): Payer: Self-pay | Admitting: Bariatrics

## 2019-04-25 DIAGNOSIS — R2689 Other abnormalities of gait and mobility: Secondary | ICD-10-CM | POA: Diagnosis not present

## 2019-04-25 DIAGNOSIS — M25572 Pain in left ankle and joints of left foot: Secondary | ICD-10-CM | POA: Diagnosis not present

## 2019-04-25 DIAGNOSIS — M25672 Stiffness of left ankle, not elsewhere classified: Secondary | ICD-10-CM | POA: Diagnosis not present

## 2019-05-01 DIAGNOSIS — R2689 Other abnormalities of gait and mobility: Secondary | ICD-10-CM | POA: Diagnosis not present

## 2019-05-01 DIAGNOSIS — M25672 Stiffness of left ankle, not elsewhere classified: Secondary | ICD-10-CM | POA: Diagnosis not present

## 2019-05-01 DIAGNOSIS — M25572 Pain in left ankle and joints of left foot: Secondary | ICD-10-CM | POA: Diagnosis not present

## 2019-05-14 ENCOUNTER — Ambulatory Visit (INDEPENDENT_AMBULATORY_CARE_PROVIDER_SITE_OTHER): Payer: Medicare Other | Admitting: Bariatrics

## 2019-05-14 ENCOUNTER — Other Ambulatory Visit: Payer: Self-pay

## 2019-05-14 ENCOUNTER — Encounter (INDEPENDENT_AMBULATORY_CARE_PROVIDER_SITE_OTHER): Payer: Self-pay | Admitting: Bariatrics

## 2019-05-14 VITALS — BP 116/72 | HR 75 | Temp 98.1°F | Ht 65.0 in | Wt 237.0 lb

## 2019-05-14 DIAGNOSIS — R7303 Prediabetes: Secondary | ICD-10-CM

## 2019-05-14 DIAGNOSIS — E559 Vitamin D deficiency, unspecified: Secondary | ICD-10-CM | POA: Diagnosis not present

## 2019-05-14 DIAGNOSIS — Z6839 Body mass index (BMI) 39.0-39.9, adult: Secondary | ICD-10-CM | POA: Diagnosis not present

## 2019-05-14 MED ORDER — VITAMIN D (ERGOCALCIFEROL) 1.25 MG (50000 UNIT) PO CAPS: 50000.0000 [IU] | ORAL_CAPSULE | ORAL | 0 refills | Status: DC

## 2019-05-14 MED ORDER — METFORMIN HCL 500 MG PO TABS: 500.0000 mg | ORAL_TABLET | Freq: Two times a day (BID) | ORAL | 0 refills | Status: DC

## 2019-05-14 NOTE — Progress Notes (Signed)
Office: [phone]  /  Fax: [phone]   HPI:   Chief Complaint: OBESITY Christie Atkinson is here to discuss her progress with her obesity treatment plan. She is on the Category 3 plan + 100 grams of protein and is following her eating plan approximately 80% of the time. She states she is walking/cleaning house 3 hours 7 times per week. Christie Atkinson is down 2 lbs and doing okay overall. She reports getting in adequate protein.  Her weight is 237 lb (107.5 kg) today and has had a weight loss of 2 pounds over a period of 4 weeks since her last visit. She has lost 12 lbs since starting treatment with [name].  Vitamin D deficiency Christie Atkinson has a diagnosis of Vitamin D deficiency. She is currently taking prescription Vit D and denies nausea, vomiting or muscle weakness.  Pre-Diabetes Christie Atkinson has a diagnosis of prediabetes based on her elevated Hgb A1c and was informed this puts her at greater risk of developing diabetes. Last A1c 5.8 on 01/24/2019 with an insulin of 20.0.  She is taking metformin currently and continues to work on diet and exercise to decrease risk of diabetes. She denies nausea or hypoglycemia. No polyphagia.  ASSESSMENT AND PLAN:  Vitamin D deficiency - Plan: Vitamin D, Ergocalciferol, (DRISDOL) 1.25 MG (50000 UT) CAPS capsule  Prediabetes - Plan: metFORMIN (GLUCOPHAGE) 500 MG tablet  Class 2 severe obesity with serious comorbidity and body mass index (BMI) of 39.0 to 39.9 in adult, unspecified obesity type (Christie Atkinson)  PLAN:  Vitamin D Deficiency Christie Atkinson was informed that low Vitamin D levels contributes to fatigue and are associated with obesity, breast, and colon cancer. She agrees to continue to take prescription Vit D @ 50,000 IU every week #4 with 0 refills and will follow-up for routine testing of Vitamin D, at least 2-3 times per year. She was informed of the risk of over-replacement of Vitamin D and agrees to not increase her dose unless she discusses this with [name] first. Christie Atkinson  agrees to follow-up with our clinic in 2-3 weeks.  Pre-Diabetes Christie Atkinson will continue to work on weight loss, exercise, and decreasing simple carbohydrates to help decrease the risk of diabetes. Christie Atkinson was given a prescription for metformin 500 mg 1 PO BID with meals #60 with 0 refills and agrees to follow-up with our clinic in 2-3 weeks.  Obesity Christie Atkinson is currently in the action stage of change. As such, her goal is to continue with weight loss efforts. She has agreed to follow the Category 3 plan + 100 grams of protein. Christie Atkinson will work on meal planning and increasing her protein intake. Christie Atkinson has been instructed to be more active and continue her current exercise regimen for weight loss and overall health benefits. We discussed the following Behavioral Modification Strategies today: increasing lean protein intake, decreasing simple carbohydrates, increasing vegetables, increase H20 intake, decrease eating out, no skipping meals, work on meal planning and easy cooking plans, and keeping healthy foods in the home.  Christie Atkinson has agreed to follow-up with our clinic in 2-3 weeks. She was informed of the importance of frequent follow-up visits to maximize her success with intensive lifestyle modifications for her multiple health conditions.  ALLERGIES: Allergies  Allergen Reactions  . Hydrocodone-Acetaminophen Nausea And Vomiting  . Other Other (See Comments)    Metals Polyester - including hospital gowns and sheets - allergic contact dermatitis  . Sulfur Hives    MEDICATIONS: Current Outpatient Medications on File Prior to Visit  Medication Sig Dispense Refill  .  acetaminophen (TYLENOL) 325 MG tablet Take 325 mg by mouth every 6 (six) hours as needed for moderate pain or headache.    [name] apixaban (ELIQUIS) 5 MG TABS tablet Take 1 tablet (5 mg total) by mouth 2 (two) times daily. 60 tablet 6  . diltiazem (CARDIZEM CD) 120 MG 24 hr capsule Take 2 tablets in the AM and 1 tablet in the PM  270 capsule 2  . dofetilide (TIKOSYN) 125 MCG capsule Take 1 capsule (125 mcg total) by mouth 2 (two) times daily. 180 capsule 3  . furosemide (LASIX) 40 MG tablet Take 40 mg by mouth.    . Magnesium Gluconate 500 (27 Mg) MG TABS Take 1 tablet (500 mg total) by mouth daily. 30 tablet 0  . Multiple Vitamins-Minerals (WOMENS MULTIVITAMIN) TABS Take 1 tablet by mouth daily. 90 tablet 0  . potassium chloride SA (K-DUR,KLOR-CON) 20 MEQ tablet Take 1 tablet (20 mEq total) by mouth daily. Take an extra 1/2 tablet by mouth on days when you take the extra fluid pill (furosemide/Lasix).     No current facility-administered medications on file prior to visit.     PAST MEDICAL HISTORY: Past Medical History:  Diagnosis Date  . Asthma   . Atrial fibrillation ([place])   . Back pain   . Diastolic dysfunction   . Fibromyalgia   . Gestational diabetes   . Hypertension   . Knee pain   . Osteoarthritis   . Persistent atrial fibrillation ([place])   . Sleep apnea    wears oral appliance  . Visit for monitoring Tikosyn therapy 02/07/2018    PAST SURGICAL HISTORY: Past Surgical History:  Procedure Laterality Date  . ABDOMINAL HYSTERECTOMY    . ABLATION OF DYSRHYTHMIC FOCUS  03/09/2018  . APPENDECTOMY    . ATRIAL FIBRILLATION ABLATION N/A 03/09/2018   Procedure: ATRIAL FIBRILLATION ABLATION;  Surgeon: [name], MD;  Location: [place] [facility];  Service: Cardiovascular;  Laterality: N/A;  . CARDIOVERSION N/A 07/04/2017   Procedure: CARDIOVERSION;  Surgeon: [name], MD;  Location: [facility] ENDOSCOPY;  Service: Cardiovascular;  Laterality: N/A;  . CARDIOVERSION N/A 09/06/2017   Procedure: CARDIOVERSION;  Surgeon: [name], MD;  Location: [facility] ENDOSCOPY;  Service: Cardiovascular;  Laterality: N/A;  . CHOLECYSTECTOMY N/A 02/24/2015   Procedure: LAPAROSCOPIC CHOLECYSTECTOMY;  Surgeon: [name], MD;  Location: [place];  Service: General;  Laterality: N/A;  . EXCISION HAGLUND'S DEFORMITY WITH ACHILLES  TENDON REPAIR Left 12/21/2018   Procedure: Left Achilles Tendon Debridement and Reconstruction; Excision of Haglund Deformity;  Surgeon: [name], MD;  Location: [place];  Service: Orthopedics;  Laterality: Left;  [name] GASTROCNEMIUS RECESSION Left 12/21/2018   Procedure: Left Gastroc Recession;  Surgeon: [name], MD;  Location: [place];  Service: Orthopedics;  Laterality: Left;  . RIGHT/LEFT HEART CATH AND CORONARY ANGIOGRAPHY N/A 08/09/2017   Procedure: RIGHT/LEFT HEART CATH AND CORONARY ANGIOGRAPHY;  Surgeon: [name], MD;  Location: [place] [facility];  Service: Cardiovascular;  Laterality: N/A;  . TEE WITHOUT CARDIOVERSION N/A 07/04/2017   Procedure: TRANSESOPHAGEAL ECHOCARDIOGRAM (TEE);  Surgeon: [name], MD;  Location: [facility] ENDOSCOPY;  Service: Cardiovascular;  Laterality: N/A;  . TEE WITHOUT CARDIOVERSION N/A 03/09/2018   Procedure: TRANSESOPHAGEAL ECHOCARDIOGRAM (TEE);  Surgeon: [name], MD;  Location: [facility] ENDOSCOPY;  Service: Cardiovascular;  Laterality: N/A;    SOCIAL HISTORY: Social History   Tobacco Use  . Smoking status: Never Smoker  . Smokeless tobacco: Never Used  Substance Use Topics  . Alcohol use: Yes    Comment: little  . Drug use: No    FAMILY HISTORY: Family History  Problem Relation Age of Onset  . Heart disease Mother   . Hypertension Mother   . Hyperlipidemia Mother   . Diabetes Mother   . Sudden death Mother   . Thyroid disease Mother   . Obesity Mother   . Heart disease Father   . Heart attack Father   . Hypertension Father   . Hyperlipidemia Father   . Diabetes Father    ROS: Review of Systems  Gastrointestinal: Negative for nausea and vomiting.  Musculoskeletal:       Negative for muscle weakness.  Endo/Heme/Allergies:       Negative for hypoglycemia. Negative for polyphagia.   PHYSICAL EXAM: Blood pressure 116/72, pulse 75, temperature 98.1 F (36.7 C), height 5\' 5"  (1.651 m),  weight 237 lb (107.5 kg), SpO2 96 %. Body mass index is 39.44 kg/m. Physical Exam Vitals signs reviewed.  Constitutional:      Appearance: Normal appearance. She is obese.  Cardiovascular:     Rate and Rhythm: Normal rate.     Pulses: Normal pulses.  Pulmonary:     Effort: Pulmonary effort is normal.     Breath sounds: Normal breath sounds.  Musculoskeletal: Normal range of motion.  Skin:    General: Skin is warm and dry.  Neurological:     Mental Status: She is alert and oriented to person, place, and time.  Psychiatric:        Behavior: Behavior normal.   RECENT LABS AND TESTS: BMET    Component Value Date/Time   NA 137 04/19/2019 1154   NA 141 01/24/2019 1313   K 4.3 04/19/2019 1154   CL 102 04/19/2019 1154   CO2 23 04/19/2019 1154   GLUCOSE 108 (H) 04/19/2019 1154   BUN 19 04/19/2019 1154   BUN 16 01/24/2019 1313   CREATININE 0.83 04/19/2019 1154   CALCIUM 9.3 04/19/2019 1154   GFRNONAA >60 04/19/2019 1154   GFRAA >60 04/19/2019 1154   Lab Results  Component Value Date   HGBA1C 5.8 (H) 01/24/2019   HGBA1C 5.8 (H) 09/21/2018   HGBA1C 6.0 (H) 05/17/2018   HGBA1C 5.8 (H) 11/02/2017   HGBA1C 5.8 (H) 06/26/2017   Lab Results  Component Value Date   INSULIN 20.0 01/24/2019   INSULIN 24.4 09/21/2018   INSULIN 16.1 05/17/2018   INSULIN 8.5 11/02/2017   CBC    Component Value Date/Time   WBC 8.5 03/08/2018 1442   WBC 7.4 08/31/2017 0930   RBC 4.46 03/08/2018 1442   RBC 4.63 08/31/2017 0930   HGB 14.0 03/08/2018 1442   HCT 42.2 03/08/2018 1442   PLT 256 03/08/2018 1442   MCV 95 03/08/2018 1442   MCH 31.4 03/08/2018 1442   MCH 30.2 08/31/2017 0930   MCHC 33.2 03/08/2018 1442   MCHC 33.2 08/31/2017 0930   RDW 13.4 03/08/2018 1442   LYMPHSABS 2.5 03/08/2018 1442   MONOABS 1.2 (H) 02/23/2015 0619   EOSABS 0.1 03/08/2018 1442   BASOSABS 0.0 03/08/2018 1442   Iron/TIBC/Ferritin/ %Sat No results found for: IRON, TIBC, FERRITIN, IRONPCTSAT Lipid Panel      Component Value Date/Time   CHOL 160 01/24/2019 1313   TRIG 174 (H) 01/24/2019 1313   HDL 31 (L) 01/24/2019 1313   CHOLHDL 5.4 06/26/2017 0517   VLDL 15 06/26/2017 0517   LDLCALC 94 01/24/2019 1313   Hepatic Function  Panel     Component Value Date/Time   PROT 7.4 01/24/2019 1313   ALBUMIN 4.3 01/24/2019 1313   AST 20 01/24/2019 1313   ALT 11 01/24/2019 1313   ALKPHOS 88 01/24/2019 1313   BILITOT 0.3 01/24/2019 1313      Component Value Date/Time   TSH 2.200 03/27/2019 1035   TSH 4.610 (H) 09/21/2018 0951   TSH 3.010 11/02/2017 1254   Results for MICHON, VLAHAKIS (MRN [id number]) as of 05/14/2019 15:22  Ref. Range 01/24/2019 13:13  Vitamin D, 25-Hydroxy Latest Ref Range: 30.0 - 100.0 ng/mL 61.0   OBESITY BEHAVIORAL INTERVENTION VISIT  Today's visit was #30  Starting weight: 249 lbs Starting date: 11/02/2017 Today's weight: 237 lbs   Today's date: 05/14/2019 Total lbs lost to date: 12 At least 15 minutes were spent on discussing the following behavioral intervention visit.    05/14/2019  Height 5\' 5"  (1.651 m)  Weight 237 lb (107.5 kg)  BMI (Calculated) 39.44  BLOOD PRESSURE - SYSTOLIC 99991111  BLOOD PRESSURE - DIASTOLIC 72   Body Fat % 0000000 %  Total Body Water (lbs) 90.8 lbs   ASK: We discussed the diagnosis of obesity with Christie Atkinson today and Christie Atkinson agreed to give [name] permission to discuss obesity behavioral modification therapy today.  ASSESS: Christie Atkinson has the diagnosis of obesity and her BMI today is 39.5. Christie Atkinson is in the action stage of change.   ADVISE: Christie Atkinson was educated on the multiple health risks of obesity as well as the benefit of weight loss to improve her health. She was advised of the need for long term treatment and the importance of lifestyle modifications to improve her current health and to decrease her risk of future health problems.  AGREE: Multiple dietary modification options and treatment options were discussed and  Christie Atkinson agreed  to follow the recommendations documented in the above note.  ARRANGE: Christie Atkinson was educated on the importance of frequent visits to treat obesity as outlined per CMS and USPSTF guidelines and agreed to schedule her next follow up appointment today.  Migdalia Dk, am acting as Location manager for [employer], DO   I have reviewed the above documentation for accuracy and completeness, and I agree with the above. -Jearld Lesch, DO

## 2019-06-13 ENCOUNTER — Encounter (INDEPENDENT_AMBULATORY_CARE_PROVIDER_SITE_OTHER): Payer: Self-pay | Admitting: Bariatrics

## 2019-06-13 ENCOUNTER — Other Ambulatory Visit: Payer: Self-pay

## 2019-06-13 ENCOUNTER — Telehealth (INDEPENDENT_AMBULATORY_CARE_PROVIDER_SITE_OTHER): Payer: Medicare Other | Admitting: Bariatrics

## 2019-06-13 DIAGNOSIS — R7303 Prediabetes: Secondary | ICD-10-CM | POA: Diagnosis not present

## 2019-06-13 DIAGNOSIS — Z6839 Body mass index (BMI) 39.0-39.9, adult: Secondary | ICD-10-CM

## 2019-06-13 DIAGNOSIS — E559 Vitamin D deficiency, unspecified: Secondary | ICD-10-CM

## 2019-06-13 DIAGNOSIS — E66812 Obesity, class 2: Secondary | ICD-10-CM

## 2019-06-13 MED ORDER — VITAMIN D (ERGOCALCIFEROL) 1.25 MG (50000 UNIT) PO CAPS: ORAL_CAPSULE | ORAL | 0 refills | Status: DC

## 2019-06-13 MED ORDER — METFORMIN HCL 500 MG PO TABS: 500.0000 mg | ORAL_TABLET | Freq: Two times a day (BID) | ORAL | 0 refills | Status: DC

## 2019-06-13 NOTE — Progress Notes (Signed)
TeleHealth Visit:  Due to the COVID-19 pandemic, this visit was completed with telemedicine (audio/video) technology to reduce patient and provider exposure as well as to preserve personal protective equipment.   Christie Atkinson has verbally consented to this TeleHealth visit. The patient is located in [place], the provider is located at the [facility] office. The participants in this visit include the listed provider. The visit was conducted today via telephone call.  Chief Complaint: OBESITY Christie Atkinson is here to discuss her progress with her obesity treatment plan along with follow-up of her obesity related diagnoses. Christie Atkinson is on the Category 3 Plan + 100 grams of protein and states she is following her eating plan approximately 90% of the time. Christie Atkinson states she is walking 1-2 miles 6 times per week.  Today's visit was #: 31 Starting weight: 249 lbs Starting date: 11/02/2017   Interim History: Christie Atkinson states that her weight has remained the same. She has controlled her appetite. She reports doing okay with her water and protein intake.  Subjective:   Vitamin D deficiency. No nausea, vomiting, or muscle weakness.   Prediabetes. No polyphagia. Last A1c 5.8 on 01/24/2019 with an insulin of 20.0.   Assessment/Plan:   Vitamin D deficiency. Vitamin D, Ergocalciferol, (DRISDOL) 1.25 MG (50000 UT) CAPS capsule 1 every 14 days #2 with 0 refills was prescribed.  Prediabetes. metFORMIN (GLUCOPHAGE) 500 MG tablet 1 BID with meals #60 with 0 refills was prescribed. Christie Atkinson will decrease carbohydrates and increase protein and healthy fats.  Class 2 severe obesity with serious comorbidity and body mass index (BMI) of 39.0 to 39.9 in adult, unspecified obesity type (Christie Atkinson).   Christie Atkinson is currently in the action stage of change. As such, her goal is to continue with weight loss efforts. She has agreed to Category 3 Plan + 100 grams of protein. She will work on meal  planning, intentional eating, and increase her protein intake.   We discussed the following exercise goals today: Christie Atkinson will continue walking and increase over time.  We discussed the following behavioral modification strategies today: increasing lean protein intake, decreasing simple carbohydrates, increasing vegetables, increasing water intake, decreasing eating out, no skipping meals, meal planning and cooking strategies, keeping healthy foods in the home and planning for success.  Christie Atkinson has agreed to follow-up with our clinic in 2-3 weeks. She was informed of the importance of frequent follow-up visits to maximize her success with intensive lifestyle modifications for her multiple health conditions.  Objective:   VITALS: Per patient if applicable, see vitals. GENERAL: Alert and in no acute distress. CARDIOPULMONARY: No increased WOB. Speaking in clear sentences.  PSYCH: Pleasant and cooperative. Speech normal rate and rhythm. Affect is appropriate. Insight and judgement are appropriate. Attention is focused, linear, and appropriate.  NEURO: Oriented as arrived to appointment on time with no prompting.   Lab Results  Component Value Date   CREATININE 0.83 04/19/2019   BUN 19 04/19/2019   NA 137 04/19/2019   K 4.3 04/19/2019   CL 102 04/19/2019   CO2 23 04/19/2019   Lab Results  Component Value Date   ALT 11 01/24/2019   AST 20 01/24/2019   ALKPHOS 88 01/24/2019   BILITOT 0.3 01/24/2019   Lab Results  Component Value Date   HGBA1C 5.8 (H) 01/24/2019   HGBA1C 5.8 (H) 09/21/2018   HGBA1C 6.0 (H) 05/17/2018   HGBA1C 5.8 (H) 11/02/2017   HGBA1C 5.8 (H) 06/26/2017   Lab  Results  Component Value Date   INSULIN 20.0 01/24/2019   INSULIN 24.4 09/21/2018   INSULIN 16.1 05/17/2018   INSULIN 8.5 11/02/2017   Lab Results  Component Value Date   TSH 2.200 03/27/2019   Lab Results  Component Value Date   CHOL 160 01/24/2019   HDL 31 (L) 01/24/2019   LDLCALC 94  01/24/2019   TRIG 174 (H) 01/24/2019   CHOLHDL 5.4 06/26/2017   Lab Results  Component Value Date   WBC 8.5 03/08/2018   HGB 14.0 03/08/2018   HCT 42.2 03/08/2018   MCV 95 03/08/2018   PLT 256 03/08/2018   No results found for: IRON, TIBC, FERRITIN Attestation Statements:   Reviewed by clinician on day of visit: allergies, medications, problem list, medical history, surgical history, family history, social history and previous encounter notes.  TIME SPENT: 20 minutes   I, Michaelene Song, am acting as Location manager for [employer], DO  I have reviewed the above documentation for accuracy and completeness, and I agree with the above. Jearld Lesch, DO

## 2019-07-03 ENCOUNTER — Encounter (INDEPENDENT_AMBULATORY_CARE_PROVIDER_SITE_OTHER): Payer: Self-pay | Admitting: Bariatrics

## 2019-07-03 ENCOUNTER — Telehealth (INDEPENDENT_AMBULATORY_CARE_PROVIDER_SITE_OTHER): Payer: Medicare Other | Admitting: Bariatrics

## 2019-07-03 ENCOUNTER — Other Ambulatory Visit: Payer: Self-pay

## 2019-07-03 DIAGNOSIS — Z6839 Body mass index (BMI) 39.0-39.9, adult: Secondary | ICD-10-CM

## 2019-07-03 DIAGNOSIS — M797 Fibromyalgia: Secondary | ICD-10-CM | POA: Diagnosis not present

## 2019-07-03 DIAGNOSIS — R7303 Prediabetes: Secondary | ICD-10-CM | POA: Diagnosis not present

## 2019-07-03 NOTE — Progress Notes (Addendum)
TeleHealth Visit:  Due to the COVID-19 pandemic, this visit was completed with telemedicine (audio/video) technology to reduce patient and provider exposure as well as to preserve personal protective equipment.   Christie Atkinson has verbally consented to this TeleHealth visit. [place] the provider is located at the [facility] office. The participants in this visit include the listed provider and patient. The visit was conducted today via telephone call ( 20 minutes ).   Christie Atkinson was unable to use realtime audiovisual technology today and the telehealth visit was conducted via telephone.  Chief Complaint: OBESITY Christie Atkinson is here to discuss her progress with her obesity treatment plan along with follow-up of her obesity related diagnoses. Bari is on the Category 3 Plan and states she is following her eating plan approximately 85% of the time. Christie Atkinson states she is walking/stretching 20 minutes 4 times per week.  Today's visit was #: 71 Starting weight: 249 lbs Starting date: 11/02/2017  Interim History: Daynah states that her weight is up a little.  Subjective:   Prediabetes. Christie Atkinson has a diagnosis of prediabetes based on her elevated HgA1c and was informed this puts her at greater risk of developing diabetes. She continues to work on diet and exercise to decrease her risk of diabetes. She denies nausea or hypoglycemia. Christie Atkinson reports craving more carbohydrates.  Lab Results  Component Value Date   HGBA1C 5.8 (H) 01/24/2019   Lab Results  Component Value Date   INSULIN 20.0 01/24/2019   INSULIN 24.4 09/21/2018   INSULIN 16.1 05/17/2018   INSULIN 8.5 11/02/2017   Fibromyalgia. Leathia is at a chiropractor at this time.  Assessment/Plan:   Prediabetes. Christie Atkinson will continue to work on weight loss, exercise, and decreasing simple carbohydrates to help decrease the risk of diabetes. She was given a prescription  for Metformin 500 mg 1 BID with meal #60 with 0 refills.   Fibromyalgia. Intensive lifestyle modifications are the first line treatment for this issue. We discussed several lifestyle modifications today and she will continue to work on diet, exercise and weight loss efforts.We will continue to monitor. Orders and follow up as documented in patient record. Ikisha will gradually increase activity, continue walking and stretching, and will follow-up with the chiropractor appointment.  Counseling Try [email or web address]/, which is a series of self-care modules designed to teach patients several techniques to manage pain.   Class 2 severe obesity with serious comorbidity and body mass index (BMI) of 39.0 to 39.9 in adult, unspecified obesity type ([place]).  Christie Atkinson is currently in the action stage of change. As such, her goal is to continue with weight loss efforts. She has agreed to the Category 3 Plan.   She will work on meal planning and intentional eating.  Exercise goals: All adults should avoid inactivity. Some physical activity is better than none, and adults who participate in any amount of physical activity gain some health benefits.  Behavioral modification strategies: increasing lean protein intake, decreasing simple carbohydrates, increasing vegetables, increasing water intake, decreasing eating out, no skipping meals, meal planning and cooking strategies and keeping healthy foods in the home.  Christie Atkinson has agreed to follow-up with our clinic in 2 weeks. She was informed of the importance of frequent follow-up visits to maximize her success with intensive lifestyle modifications for her multiple health conditions.  Objective:   VITALS: Per patient if applicable, see vitals. GENERAL: Alert and in no acute distress. CARDIOPULMONARY: No increased WOB.  Speaking in clear sentences.  PSYCH: Pleasant and cooperative. Speech normal rate and rhythm. Affect is appropriate. Insight and  judgement are appropriate. Attention is focused, linear, and appropriate.  NEURO: Oriented as arrived to appointment on time with no prompting.   Lab Results  Component Value Date   CREATININE 0.83 04/19/2019   BUN 19 04/19/2019   NA 137 04/19/2019   K 4.3 04/19/2019   CL 102 04/19/2019   CO2 23 04/19/2019   Lab Results  Component Value Date   ALT 11 01/24/2019   AST 20 01/24/2019   ALKPHOS 88 01/24/2019   BILITOT 0.3 01/24/2019   Lab Results  Component Value Date   HGBA1C 5.8 (H) 01/24/2019   HGBA1C 5.8 (H) 09/21/2018   HGBA1C 6.0 (H) 05/17/2018   HGBA1C 5.8 (H) 11/02/2017   HGBA1C 5.8 (H) 06/26/2017   Lab Results  Component Value Date   INSULIN 20.0 01/24/2019   INSULIN 24.4 09/21/2018   INSULIN 16.1 05/17/2018   INSULIN 8.5 11/02/2017   Lab Results  Component Value Date   TSH 2.200 03/27/2019   Lab Results  Component Value Date   CHOL 160 01/24/2019   HDL 31 (L) 01/24/2019   LDLCALC 94 01/24/2019   TRIG 174 (H) 01/24/2019   CHOLHDL 5.4 06/26/2017   Lab Results  Component Value Date   WBC 8.5 03/08/2018   HGB 14.0 03/08/2018   HCT 42.2 03/08/2018   MCV 95 03/08/2018   PLT 256 03/08/2018   No results found for: IRON, TIBC, FERRITIN  Attestation Statements:   Reviewed by clinician on day of visit: allergies, medications, problem list, medical history, surgical history, family history, social history, and previous encounter notes.  Migdalia Dk, am acting as Location manager for [employer], DO   I have reviewed the above documentation for accuracy and completeness, and I agree with the above. Jearld Lesch, DO

## 2019-07-04 ENCOUNTER — Other Ambulatory Visit (HOSPITAL_COMMUNITY): Payer: Self-pay | Admitting: *Deleted

## 2019-07-04 MED ORDER — METFORMIN HCL 500 MG PO TABS: 500.0000 mg | ORAL_TABLET | Freq: Two times a day (BID) | ORAL | 0 refills | Status: DC

## 2019-07-04 MED ORDER — APIXABAN 5 MG PO TABS: 5.0000 mg | ORAL_TABLET | Freq: Two times a day (BID) | ORAL | 1 refills | Status: DC

## 2019-07-19 ENCOUNTER — Encounter (INDEPENDENT_AMBULATORY_CARE_PROVIDER_SITE_OTHER): Payer: Self-pay | Admitting: Bariatrics

## 2019-07-19 ENCOUNTER — Telehealth (INDEPENDENT_AMBULATORY_CARE_PROVIDER_SITE_OTHER): Payer: Medicare Other | Admitting: Bariatrics

## 2019-07-19 ENCOUNTER — Other Ambulatory Visit: Payer: Self-pay

## 2019-07-19 DIAGNOSIS — E559 Vitamin D deficiency, unspecified: Secondary | ICD-10-CM | POA: Diagnosis not present

## 2019-07-19 DIAGNOSIS — R7303 Prediabetes: Secondary | ICD-10-CM

## 2019-07-19 DIAGNOSIS — Z6839 Body mass index (BMI) 39.0-39.9, adult: Secondary | ICD-10-CM | POA: Diagnosis not present

## 2019-07-19 NOTE — Progress Notes (Signed)
TeleHealth Visit:  Due to the COVID-19 pandemic, this visit was completed with telemedicine (audio/video) technology to reduce patient and provider exposure as well as to preserve personal protective equipment.   Christie Atkinson has verbally consented to this TeleHealth visit. The patient is located in [place], the provider is located at the [facility] office. The participants in this visit include the listed provider and patient. The visit was conducted today via telephone called (FaceTime failed and changed to telephone call).  Ruari was unable to use realtime audiovisual technology today and the telehealth visit was conducted via telephone.  Chief Complaint: OBESITY Christie Atkinson is here to discuss her progress with her obesity treatment plan along with follow-up of her obesity related diagnoses. Christie Atkinson is on the Category 3 Plan and states she is following her eating plan approximately 90% of the time. Christie Atkinson states she is waking 1.5-2 miles 7 times per week.  Today's visit was #: 77 Starting weight: 249 lbs Starting date: 11/02/2017  Interim History: Christie Atkinson states that her weight remains the same. She is still out in [place].  Subjective:   Vitamin D deficiency. Tona is taking Vitamin D over-the-counter. No nausea, vomiting, or muscle weakness. Last Vitamin D level 61.0 on 01/24/2019.  Prediabetes. Christie Atkinson has a diagnosis of prediabetes based on her elevated HgA1c and was informed this puts her at greater risk of developing diabetes. She continues to work on diet and exercise to decrease her risk of diabetes. She denies nausea or hypoglycemia. No polyphagia.   Lab Results  Component Value Date   HGBA1C 5.8 (H) 01/24/2019   Lab Results  Component Value Date   INSULIN 20.0 01/24/2019   INSULIN 24.4 09/21/2018   INSULIN 16.1 05/17/2018   INSULIN 8.5 11/02/2017   Assessment/Plan:   Vitamin D deficiency. Low Vitamin D level contributes to fatigue and are  associated with obesity, breast, and colon cancer. She agrees to continue to take Vitamin D and will follow-up for routine testing of Vitamin D, at least 2-3 times per year to avoid over-replacement.  Prediabetes. Christie Atkinson will continue to work on weight loss, exercise, increasing healthy fats and protein, and decreasing simple carbohydrates to help decrease the risk of diabetes.   Class 2 severe obesity with serious comorbidity and body mass index (BMI) of 39.0 to 39.9 in adult, unspecified obesity type ([place]).  Christie Atkinson is currently in the action stage of change. As such, her goal is to continue with weight loss efforts. She has agreed to the Category 3 Plan.   She will work on meal planning and intentional eating.  Exercise goals: Christie Atkinson will continue walking 1.5-2 miles 7 days a week with some yoga.  Behavioral modification strategies: increasing lean protein intake, decreasing simple carbohydrates, increasing vegetables, increasing water intake, decreasing eating out, no skipping meals, meal planning and cooking strategies, keeping healthy foods in the home, better snacking choices, emotional eating strategies and planning for success.  Christie Atkinson has agreed to follow-up with our clinic in 2 weeks. She was informed of the importance of frequent follow-up visits to maximize her success with intensive lifestyle modifications for her multiple health conditions.  Objective:   VITALS: Per patient if applicable, see vitals. GENERAL: Alert and in no acute distress. CARDIOPULMONARY: No increased WOB. Speaking in clear sentences.  PSYCH: Pleasant and cooperative. Speech normal rate and rhythm. Affect is appropriate. Insight and judgement are appropriate. Attention is focused, linear, and appropriate.  NEURO: Oriented as arrived to appointment on time with  no prompting.   Lab Results  Component Value Date   CREATININE 0.83 04/19/2019   BUN 19 04/19/2019   NA 137 04/19/2019   K 4.3 04/19/2019   CL  102 04/19/2019   CO2 23 04/19/2019   Lab Results  Component Value Date   ALT 11 01/24/2019   AST 20 01/24/2019   ALKPHOS 88 01/24/2019   BILITOT 0.3 01/24/2019   Lab Results  Component Value Date   HGBA1C 5.8 (H) 01/24/2019   HGBA1C 5.8 (H) 09/21/2018   HGBA1C 6.0 (H) 05/17/2018   HGBA1C 5.8 (H) 11/02/2017   HGBA1C 5.8 (H) 06/26/2017   Lab Results  Component Value Date   INSULIN 20.0 01/24/2019   INSULIN 24.4 09/21/2018   INSULIN 16.1 05/17/2018   INSULIN 8.5 11/02/2017   Lab Results  Component Value Date   TSH 2.200 03/27/2019   Lab Results  Component Value Date   CHOL 160 01/24/2019   HDL 31 (L) 01/24/2019   LDLCALC 94 01/24/2019   TRIG 174 (H) 01/24/2019   CHOLHDL 5.4 06/26/2017   Lab Results  Component Value Date   WBC 8.5 03/08/2018   HGB 14.0 03/08/2018   HCT 42.2 03/08/2018   MCV 95 03/08/2018   PLT 256 03/08/2018   No results found for: IRON, TIBC, FERRITIN  Attestation Statements:   Reviewed by clinician on day of visit: allergies, medications, problem list, medical history, surgical history, family history, social history, and previous encounter notes.  Time spent on visit including pre-visit chart review and post-visit care was 20 minutes.   Migdalia Dk, am acting as Location manager for [employer], DO   I have reviewed the above documentation for accuracy and completeness, and I agree with the above. Jearld Lesch, DO

## 2019-08-01 ENCOUNTER — Other Ambulatory Visit (HOSPITAL_COMMUNITY): Payer: Self-pay | Admitting: *Deleted

## 2019-08-01 MED ORDER — APIXABAN 5 MG PO TABS: 5.0000 mg | ORAL_TABLET | Freq: Two times a day (BID) | ORAL | 3 refills | Status: DC

## 2019-08-01 MED ORDER — POTASSIUM CHLORIDE CRYS ER 20 MEQ PO TBCR: 20.0000 meq | EXTENDED_RELEASE_TABLET | Freq: Every day | ORAL | 2 refills | Status: DC

## 2019-08-01 MED ORDER — DILTIAZEM HCL ER COATED BEADS 120 MG PO CP24: ORAL_CAPSULE | ORAL | 2 refills | Status: DC

## 2019-08-01 MED ORDER — DOFETILIDE 125 MCG PO CAPS: 125.0000 ug | ORAL_CAPSULE | Freq: Two times a day (BID) | ORAL | 2 refills | Status: DC

## 2019-08-02 ENCOUNTER — Telehealth (INDEPENDENT_AMBULATORY_CARE_PROVIDER_SITE_OTHER): Payer: Medicare Other | Admitting: Family Medicine

## 2019-08-02 ENCOUNTER — Other Ambulatory Visit: Payer: Self-pay

## 2019-08-02 ENCOUNTER — Encounter (INDEPENDENT_AMBULATORY_CARE_PROVIDER_SITE_OTHER): Payer: Self-pay | Admitting: Family Medicine

## 2019-08-02 DIAGNOSIS — Z6839 Body mass index (BMI) 39.0-39.9, adult: Secondary | ICD-10-CM

## 2019-08-02 DIAGNOSIS — R0602 Shortness of breath: Secondary | ICD-10-CM

## 2019-08-02 DIAGNOSIS — I480 Paroxysmal atrial fibrillation: Secondary | ICD-10-CM

## 2019-08-02 MED ORDER — POTASSIUM CHLORIDE CRYS ER 20 MEQ PO TBCR: 20.0000 meq | EXTENDED_RELEASE_TABLET | Freq: Every day | ORAL | 0 refills | Status: DC

## 2019-08-02 NOTE — Progress Notes (Signed)
TeleHealth Visit:  Due to the COVID-19 pandemic, this visit was completed with telemedicine (audio/video) technology to reduce patient and provider exposure as well as to preserve personal protective equipment.   Christie Atkinson has verbally consented to this TeleHealth visit. The patient is located in [place], [place], the provider is located at the [facility] office. The participants in this visit include the listed provider and patient and any and all parties involved. The visit was conducted today via telephone.  Christie Atkinson was unable to use realtime audiovisual technology today and the telehealth visit was conducted via telephone.  Chief Complaint: OBESITY Christie Atkinson is here to discuss her progress with her obesity treatment plan along with follow-up of her obesity related diagnoses. Christie Atkinson is on the Category 3 Plan and states she is following her eating plan approximately 90% of the time. Christie Atkinson states she is walking 3,000 steps, 7 times per week.  Today's visit was #: 19 Starting weight: 249 lbs Starting date: 11/02/2017  Interim History: Christie Atkinson voices that she is in [place], taking care of her husband. She is trying to get back on track, in terms of the meal plan. She is doing a cheese tortilla, beef and eggs for breakfast. Lunch is a spoonful of chicken salad. She has decreased donuts and potatoes in the past two weeks. Christie Atkinson reports she is maintaining weight (no weight reported).  Subjective:   Shortness of breath on exertion  Nance is on Lasix daily. She is occasionally requiring an extra fluid pill, and this is when she needs potassium supplement.  Paroxysmal atrial fibrillation [facility]) Patient voices that she feels in rhythm, but she did feel that occasionally she is about to go into A-fib, but she has no symptoms currently.  Assessment/Plan:   Shortness of breath on exertion  Christie Atkinson agrees to continue potassium chloride SA (KLOR-CON) 20 MEQ tablet daily  #100 with no refills.  Paroxysmal atrial fibrillation (HCC) Christie Atkinson will follow up with [facility] in [facility] to see cardiology.  Class 2 severe obesity with serious comorbidity and body mass index (BMI) of 39.0 to 39.9 in adult, unspecified obesity type (HCC) Christie Atkinson is currently in the action stage of change. As such, her goal is to continue with weight loss efforts. She has agreed to the Category 3 Plan.   Behavioral modification strategies: increasing lean protein intake, increasing vegetables, meal planning and cooking strategies, keeping healthy foods in the home and planning for success.  Christie Atkinson has agreed to follow-up with our clinic in 2 weeks. She was informed of the importance of frequent follow-up visits to maximize her success with intensive lifestyle modifications for her multiple health conditions.  Objective:   VITALS: Per patient if applicable, see vitals. GENERAL: Alert and in no acute distress. CARDIOPULMONARY: No increased WOB. Speaking in clear sentences.  PSYCH: Pleasant and cooperative. Speech normal rate and rhythm. Affect is appropriate. Insight and judgement are appropriate. Attention is focused, linear, and appropriate.  NEURO: Oriented as arrived to appointment on time with no prompting.   Lab Results  Component Value Date   CREATININE 0.83 04/19/2019   BUN 19 04/19/2019   NA 137 04/19/2019   K 4.3 04/19/2019   CL 102 04/19/2019   CO2 23 04/19/2019   Lab Results  Component Value Date   ALT 11 01/24/2019   AST 20 01/24/2019   ALKPHOS 88 01/24/2019   BILITOT 0.3 01/24/2019   Lab Results  Component Value Date   HGBA1C 5.8 (H) 01/24/2019  HGBA1C 5.8 (H) 09/21/2018   HGBA1C 6.0 (H) 05/17/2018   HGBA1C 5.8 (H) 11/02/2017   HGBA1C 5.8 (H) 06/26/2017   Lab Results  Component Value Date   INSULIN 20.0 01/24/2019   INSULIN 24.4 09/21/2018   INSULIN 16.1 05/17/2018   INSULIN 8.5 11/02/2017   Lab Results  Component Value Date   TSH 2.200  03/27/2019   Lab Results  Component Value Date   CHOL 160 01/24/2019   HDL 31 (L) 01/24/2019   LDLCALC 94 01/24/2019   TRIG 174 (H) 01/24/2019   CHOLHDL 5.4 06/26/2017   Lab Results  Component Value Date   WBC 8.5 03/08/2018   HGB 14.0 03/08/2018   HCT 42.2 03/08/2018   MCV 95 03/08/2018   PLT 256 03/08/2018   No results found for: IRON, TIBC, FERRITIN   Ref. Range 01/24/2019 13:13  Vitamin D, 25-Hydroxy Latest Ref Range: 30.0 - 100.0 ng/mL 61.0   Attestation Statements:   Reviewed by clinician on day of visit: allergies, medications, problem list, medical history, surgical history, family history, social history, and previous encounter notes.  Time spent on visit including pre-visit chart review and post-visit charting and care was 14 minutes.   I, Doreene Nest, am acting as transcriptionist for [name], MD.  I have reviewed the above documentation for accuracy and completeness, and I agree with the above. - Ilene Qua, MD

## 2019-08-14 ENCOUNTER — Telehealth: Payer: Self-pay | Admitting: Internal Medicine

## 2019-08-14 DIAGNOSIS — K5792 Diverticulitis of intestine, part unspecified, without perforation or abscess without bleeding: Secondary | ICD-10-CM | POA: Diagnosis not present

## 2019-08-14 DIAGNOSIS — M1990 Unspecified osteoarthritis, unspecified site: Secondary | ICD-10-CM | POA: Diagnosis not present

## 2019-08-14 DIAGNOSIS — M797 Fibromyalgia: Secondary | ICD-10-CM | POA: Diagnosis not present

## 2019-08-14 DIAGNOSIS — M549 Dorsalgia, unspecified: Secondary | ICD-10-CM | POA: Diagnosis not present

## 2019-08-14 DIAGNOSIS — I4891 Unspecified atrial fibrillation: Secondary | ICD-10-CM | POA: Diagnosis not present

## 2019-08-14 DIAGNOSIS — Z6839 Body mass index (BMI) 39.0-39.9, adult: Secondary | ICD-10-CM | POA: Diagnosis not present

## 2019-08-14 DIAGNOSIS — E119 Type 2 diabetes mellitus without complications: Secondary | ICD-10-CM | POA: Diagnosis not present

## 2019-08-14 NOTE — Telephone Encounter (Signed)
   Pt is out of state she's in [place], she's been having episode of afib with upper right back pain. She went to doctor up there and was prescribed beta blockers, she couldn't remember the name of the meds but would like to check with [name].  Please call

## 2019-08-14 NOTE — Telephone Encounter (Signed)
Talked with patient - she is having issues with some breakthrough - ok to take beta blocker if needed as long as they monitor her HR. Pt will have records sent if she can to update our records.

## 2019-08-15 ENCOUNTER — Ambulatory Visit (HOSPITAL_COMMUNITY): Payer: Medicare Other | Admitting: Nurse Practitioner

## 2019-08-16 ENCOUNTER — Other Ambulatory Visit: Payer: Self-pay

## 2019-08-16 ENCOUNTER — Telehealth (INDEPENDENT_AMBULATORY_CARE_PROVIDER_SITE_OTHER): Payer: Medicare Other | Admitting: Family Medicine

## 2019-08-16 DIAGNOSIS — E559 Vitamin D deficiency, unspecified: Secondary | ICD-10-CM

## 2019-08-16 DIAGNOSIS — I480 Paroxysmal atrial fibrillation: Secondary | ICD-10-CM

## 2019-08-16 DIAGNOSIS — Z6839 Body mass index (BMI) 39.0-39.9, adult: Secondary | ICD-10-CM

## 2019-08-20 NOTE — Progress Notes (Signed)
TeleHealth Visit:  Due to the COVID-19 pandemic, this visit was completed with telemedicine (audio/video) technology to reduce patient and provider exposure as well as to preserve personal protective equipment.   Pami has verbally consented to this TeleHealth visit. The patient is located at home, the provider is located at the [facility] office. The participants in this visit include the listed provider and patient. Keina was unable to use realtime audiovisual technology today and the telehealth visit was conducted via telephone.  Chief Complaint: OBESITY Jerah is here to discuss her progress with her obesity treatment plan along with follow-up of her obesity related diagnoses. Tyree is on the Category 3 Plan and states she is following her eating plan approximately 95% of the time. Lutha states she is walking 1 mile 5,000 steps.  Today's visit was #: 35 Starting weight: 249 lbs Starting date: 11/02/2017  Interim History: Myrka has had a busy few weeks. She realizes she has been introducing more carbohydrates since she started cooking for her husband. She  acknowledges she needs to be more mindful of indulgences she has started eating. She reports a weight of 241 lbs today.  Subjective:   1. Paroxysmal atrial fibrillation (HCC) Lexianna notes previously having orthopnea and dyspnea on exertion. She saw a Cardiologist in [place] and was started on beta blocker.  2. Vitamin D deficiency Takiya's last Vit D level on her last labs was 1 in August 2020. She notes fatigue.  Assessment/Plan:   1. Paroxysmal atrial fibrillation (HCC) Placida is to follow up with her Cardiologist, and will continue to monitor.  2. Vitamin D deficiency Low Vitamin D level contributes to fatigue and are associated with obesity, breast, and colon cancer. Angle will follow-up for routine testing of Vitamin D, at least 2-3 times per year to avoid over-replacement. We will need to  check her Vit D level when she returns.  3. Class 2 severe obesity with serious comorbidity and body mass index (BMI) of 39.0 to 39.9 in adult, unspecified obesity type (HCC) Tamsen is currently in the action stage of change. As such, her goal is to continue with weight loss efforts. She has agreed to the Category 3 Plan.   Exercise goals: As is.  Behavioral modification strategies: increasing lean protein intake, increasing vegetables, meal planning and cooking strategies, keeping healthy foods in the home and planning for success.  Keannah has agreed to follow-up with our clinic in 2 weeks. She was informed of the importance of frequent follow-up visits to maximize her success with intensive lifestyle modifications for her multiple health conditions.  Objective:   VITALS: Per patient if applicable, see vitals. GENERAL: Alert and in no acute distress. CARDIOPULMONARY: No increased WOB. Speaking in clear sentences.  PSYCH: Pleasant and cooperative. Speech normal rate and rhythm. Affect is appropriate. Insight and judgement are appropriate. Attention is focused, linear, and appropriate.  NEURO: Oriented as arrived to appointment on time with no prompting.   Lab Results  Component Value Date   CREATININE 0.83 04/19/2019   BUN 19 04/19/2019   NA 137 04/19/2019   K 4.3 04/19/2019   CL 102 04/19/2019   CO2 23 04/19/2019   Lab Results  Component Value Date   ALT 11 01/24/2019   AST 20 01/24/2019   ALKPHOS 88 01/24/2019   BILITOT 0.3 01/24/2019   Lab Results  Component Value Date   HGBA1C 5.8 (H) 01/24/2019   HGBA1C 5.8 (H) 09/21/2018   HGBA1C 6.0 (H) 05/17/2018  HGBA1C 5.8 (H) 11/02/2017   HGBA1C 5.8 (H) 06/26/2017   Lab Results  Component Value Date   INSULIN 20.0 01/24/2019   INSULIN 24.4 09/21/2018   INSULIN 16.1 05/17/2018   INSULIN 8.5 11/02/2017   Lab Results  Component Value Date   TSH 2.200 03/27/2019   Lab Results  Component Value Date   CHOL 160  01/24/2019   HDL 31 (L) 01/24/2019   LDLCALC 94 01/24/2019   TRIG 174 (H) 01/24/2019   CHOLHDL 5.4 06/26/2017   Lab Results  Component Value Date   WBC 8.5 03/08/2018   HGB 14.0 03/08/2018   HCT 42.2 03/08/2018   MCV 95 03/08/2018   PLT 256 03/08/2018   No results found for: IRON, TIBC, FERRITIN  Attestation Statements:   Reviewed by clinician on day of visit: allergies, medications, problem list, medical history, surgical history, family history, social history, and previous encounter notes.  Time spent on visit including pre-visit chart review and post-visit charting and care was 23 minutes.    I, Trixie Dredge, am acting as transcriptionist for Ilene Qua, MD.  I have reviewed the above documentation for accuracy and completeness, and I agree with the above. - Ilene Qua, MD

## 2019-08-22 DIAGNOSIS — I4819 Other persistent atrial fibrillation: Secondary | ICD-10-CM | POA: Diagnosis not present

## 2019-08-22 DIAGNOSIS — Z6839 Body mass index (BMI) 39.0-39.9, adult: Secondary | ICD-10-CM | POA: Diagnosis not present

## 2019-08-28 ENCOUNTER — Telehealth (INDEPENDENT_AMBULATORY_CARE_PROVIDER_SITE_OTHER): Payer: Medicare Other | Admitting: Family Medicine

## 2019-08-28 ENCOUNTER — Other Ambulatory Visit: Payer: Self-pay

## 2019-08-28 ENCOUNTER — Encounter (INDEPENDENT_AMBULATORY_CARE_PROVIDER_SITE_OTHER): Payer: Self-pay | Admitting: Family Medicine

## 2019-08-28 DIAGNOSIS — I4811 Longstanding persistent atrial fibrillation: Secondary | ICD-10-CM | POA: Diagnosis not present

## 2019-08-28 DIAGNOSIS — Z6839 Body mass index (BMI) 39.0-39.9, adult: Secondary | ICD-10-CM

## 2019-08-28 DIAGNOSIS — E1169 Type 2 diabetes mellitus with other specified complication: Secondary | ICD-10-CM | POA: Diagnosis not present

## 2019-08-28 DIAGNOSIS — I480 Paroxysmal atrial fibrillation: Secondary | ICD-10-CM | POA: Diagnosis not present

## 2019-08-28 DIAGNOSIS — Z6841 Body Mass Index (BMI) 40.0 and over, adult: Secondary | ICD-10-CM | POA: Diagnosis not present

## 2019-08-28 DIAGNOSIS — R7303 Prediabetes: Secondary | ICD-10-CM | POA: Diagnosis not present

## 2019-08-28 DIAGNOSIS — I10 Essential (primary) hypertension: Secondary | ICD-10-CM | POA: Diagnosis not present

## 2019-08-28 DIAGNOSIS — M797 Fibromyalgia: Secondary | ICD-10-CM | POA: Diagnosis not present

## 2019-08-28 MED ORDER — METFORMIN HCL 500 MG PO TABS: 500.0000 mg | ORAL_TABLET | Freq: Two times a day (BID) | ORAL | 0 refills | Status: DC

## 2019-08-28 NOTE — Progress Notes (Signed)
TeleHealth Visit:  Due to the COVID-19 pandemic, this visit was completed with telemedicine (audio/video) technology to reduce patient and provider exposure as well as to preserve personal protective equipment.   Christie Atkinson has verbally consented to this TeleHealth visit. The patient is located at home, the provider is located at the [facility] office. The participants in this visit include the listed provider and patient. The visit was conducted today via audio.  Calli was unable to use realtime audiovisual technology today and the telehealth visit was conducted via telephone.  Chief Complaint: OBESITY Christie Atkinson is here to discuss her progress with her obesity treatment plan along with follow-up of her obesity related diagnoses. Christie Atkinson is on the Category 3 Plan and states she is following her eating plan approximately 90% of the time. Christie Atkinson states she is walking 20-30 minutes 7 times per week.  Today's visit was #: 45 Starting weight: 249 lbs Starting date: 11/02/2017  Interim History: Christie Atkinson has had an increase in stress over the past few days secondary to doctor's appointments for both her husband and her. She has been indulging in carbohydrates more than normal for her. She would like to get back on track in terms of her meal plan.  Subjective:   Paroxysmal atrial fibrillation ([place]). Christie Atkinson has an appointment in 2 days for further evaluation with Cardiology. She reports being back in rhythm now. She was put back on medication for atrial fibrillation.  Prediabetes. Christie Atkinson has a diagnosis of prediabetes based on her elevated HgA1c and was informed this puts her at greater risk of developing diabetes. She continues to work on diet and exercise to decrease her risk of diabetes. She denies nausea or hypoglycemia. Christie Atkinson is on metformin BID with denies GI side effects.  Lab Results  Component Value Date   HGBA1C 5.8 (H) 01/24/2019   Lab Results  Component  Value Date   INSULIN 20.0 01/24/2019   INSULIN 24.4 09/21/2018   INSULIN 16.1 05/17/2018   INSULIN 8.5 11/02/2017   Assessment/Plan:   Paroxysmal atrial fibrillation ([place]). Will follow-up with the patient at her next appointment.  Prediabetes. Christie Atkinson will continue to work on weight loss, exercise, and decreasing simple carbohydrates to help decrease the risk of diabetes. She was given a refill on her metFORMIN (GLUCOPHAGE) 500 MG tablet PO BID #180 with 0 refills.  Class 2 severe obesity with serious comorbidity and body mass index (BMI) of 39.0 to 39.9 in adult, unspecified obesity type ([place]).  Christie Atkinson is currently in the action stage of change. As such, her goal is to continue with weight loss efforts. She has agreed to the Category 3 Plan.   Exercise goals:  Christie Atkinson will continue her current exercise regimen.  Behavioral modification strategies: increasing lean protein intake, increasing vegetables, meal planning and cooking strategies, keeping healthy foods in the home and planning for success.  Christie Atkinson has agreed to follow-up with our clinic in 9 weeks. She was informed of the importance of frequent follow-up visits to maximize her success with intensive lifestyle modifications for her multiple health conditions.  Objective:   VITALS: Per patient if applicable, see vitals. GENERAL: Alert and in no acute distress. CARDIOPULMONARY: No increased WOB. Speaking in clear sentences.  PSYCH: Pleasant and cooperative. Speech normal rate and rhythm. Affect is appropriate. Insight and judgement are appropriate. Attention is focused, linear, and appropriate.  NEURO: Oriented as arrived to appointment on time with no prompting.   Lab Results  Component Value Date  CREATININE 0.83 04/19/2019   BUN 19 04/19/2019   NA 137 04/19/2019   K 4.3 04/19/2019   CL 102 04/19/2019   CO2 23 04/19/2019   Lab Results  Component Value Date   ALT 11 01/24/2019   AST 20 01/24/2019   ALKPHOS 88  01/24/2019   BILITOT 0.3 01/24/2019   Lab Results  Component Value Date   HGBA1C 5.8 (H) 01/24/2019   HGBA1C 5.8 (H) 09/21/2018   HGBA1C 6.0 (H) 05/17/2018   HGBA1C 5.8 (H) 11/02/2017   HGBA1C 5.8 (H) 06/26/2017   Lab Results  Component Value Date   INSULIN 20.0 01/24/2019   INSULIN 24.4 09/21/2018   INSULIN 16.1 05/17/2018   INSULIN 8.5 11/02/2017   Lab Results  Component Value Date   TSH 2.200 03/27/2019   Lab Results  Component Value Date   CHOL 160 01/24/2019   HDL 31 (L) 01/24/2019   LDLCALC 94 01/24/2019   TRIG 174 (H) 01/24/2019   CHOLHDL 5.4 06/26/2017   Lab Results  Component Value Date   WBC 8.5 03/08/2018   HGB 14.0 03/08/2018   HCT 42.2 03/08/2018   MCV 95 03/08/2018   PLT 256 03/08/2018   No results found for: IRON, TIBC, FERRITIN  Attestation Statements:   Reviewed by clinician on day of visit: allergies, medications, problem list, medical history, surgical history, family history, social history, and previous encounter notes.  Time spent on visit including pre-visit chart review and post-visit charting and care was 17 minutes.   I, Michaelene Song, am acting as transcriptionist for [name], MD   I have reviewed the above documentation for accuracy and completeness, and I agree with the above. - Ilene Qua, MD

## 2019-08-30 DIAGNOSIS — I10 Essential (primary) hypertension: Secondary | ICD-10-CM | POA: Diagnosis not present

## 2019-08-30 DIAGNOSIS — I4819 Other persistent atrial fibrillation: Secondary | ICD-10-CM | POA: Diagnosis not present

## 2019-09-10 ENCOUNTER — Telehealth: Payer: Self-pay | Admitting: Internal Medicine

## 2019-09-10 NOTE — Telephone Encounter (Signed)
Returned call to patient she is having orthopnea she is having slight LLE. She will take an extra 20mg  of lasix this afternoon. She had a recent echo which she will fax over results as well as her recent office visit with cardiologist there in [place]. She states "something was wrong with the left ventricle" on the echo they performed. Informed pt I would be in touch once echocardiogram reviewed.

## 2019-09-10 NOTE — Telephone Encounter (Signed)
  Patient is calling because she is not sleeping good and she gasps for air at night. She would like to talk to Dr [name] or his nurse and find out what her diagnoses are and so she can ask some questions. She is in [place] at present and is in a higher altitude and wonders if that has something to do with it. Please call

## 2019-09-27 DIAGNOSIS — J45909 Unspecified asthma, uncomplicated: Secondary | ICD-10-CM | POA: Diagnosis not present

## 2019-09-27 DIAGNOSIS — Z7901 Long term (current) use of anticoagulants: Secondary | ICD-10-CM | POA: Diagnosis not present

## 2019-09-27 DIAGNOSIS — R42 Dizziness and giddiness: Secondary | ICD-10-CM | POA: Diagnosis not present

## 2019-09-27 DIAGNOSIS — Z743 Need for continuous supervision: Secondary | ICD-10-CM | POA: Diagnosis not present

## 2019-09-27 DIAGNOSIS — E119 Type 2 diabetes mellitus without complications: Secondary | ICD-10-CM | POA: Diagnosis not present

## 2019-09-27 DIAGNOSIS — Z01810 Encounter for preprocedural cardiovascular examination: Secondary | ICD-10-CM | POA: Diagnosis not present

## 2019-09-27 DIAGNOSIS — M542 Cervicalgia: Secondary | ICD-10-CM | POA: Diagnosis not present

## 2019-09-27 DIAGNOSIS — R55 Syncope and collapse: Secondary | ICD-10-CM | POA: Diagnosis not present

## 2019-09-27 DIAGNOSIS — I4891 Unspecified atrial fibrillation: Secondary | ICD-10-CM | POA: Diagnosis not present

## 2019-09-27 DIAGNOSIS — I10 Essential (primary) hypertension: Secondary | ICD-10-CM | POA: Diagnosis not present

## 2019-10-10 IMAGING — DX DG CHEST 2V
2 series · 2 of 2 positions shown · non-contrast
Comparison: 08/23/2003

CLINICAL DATA: Increasing shortness of breath. AFib. Elevated BNP.

EXAM:
CHEST  2 VIEW

[chest pa]
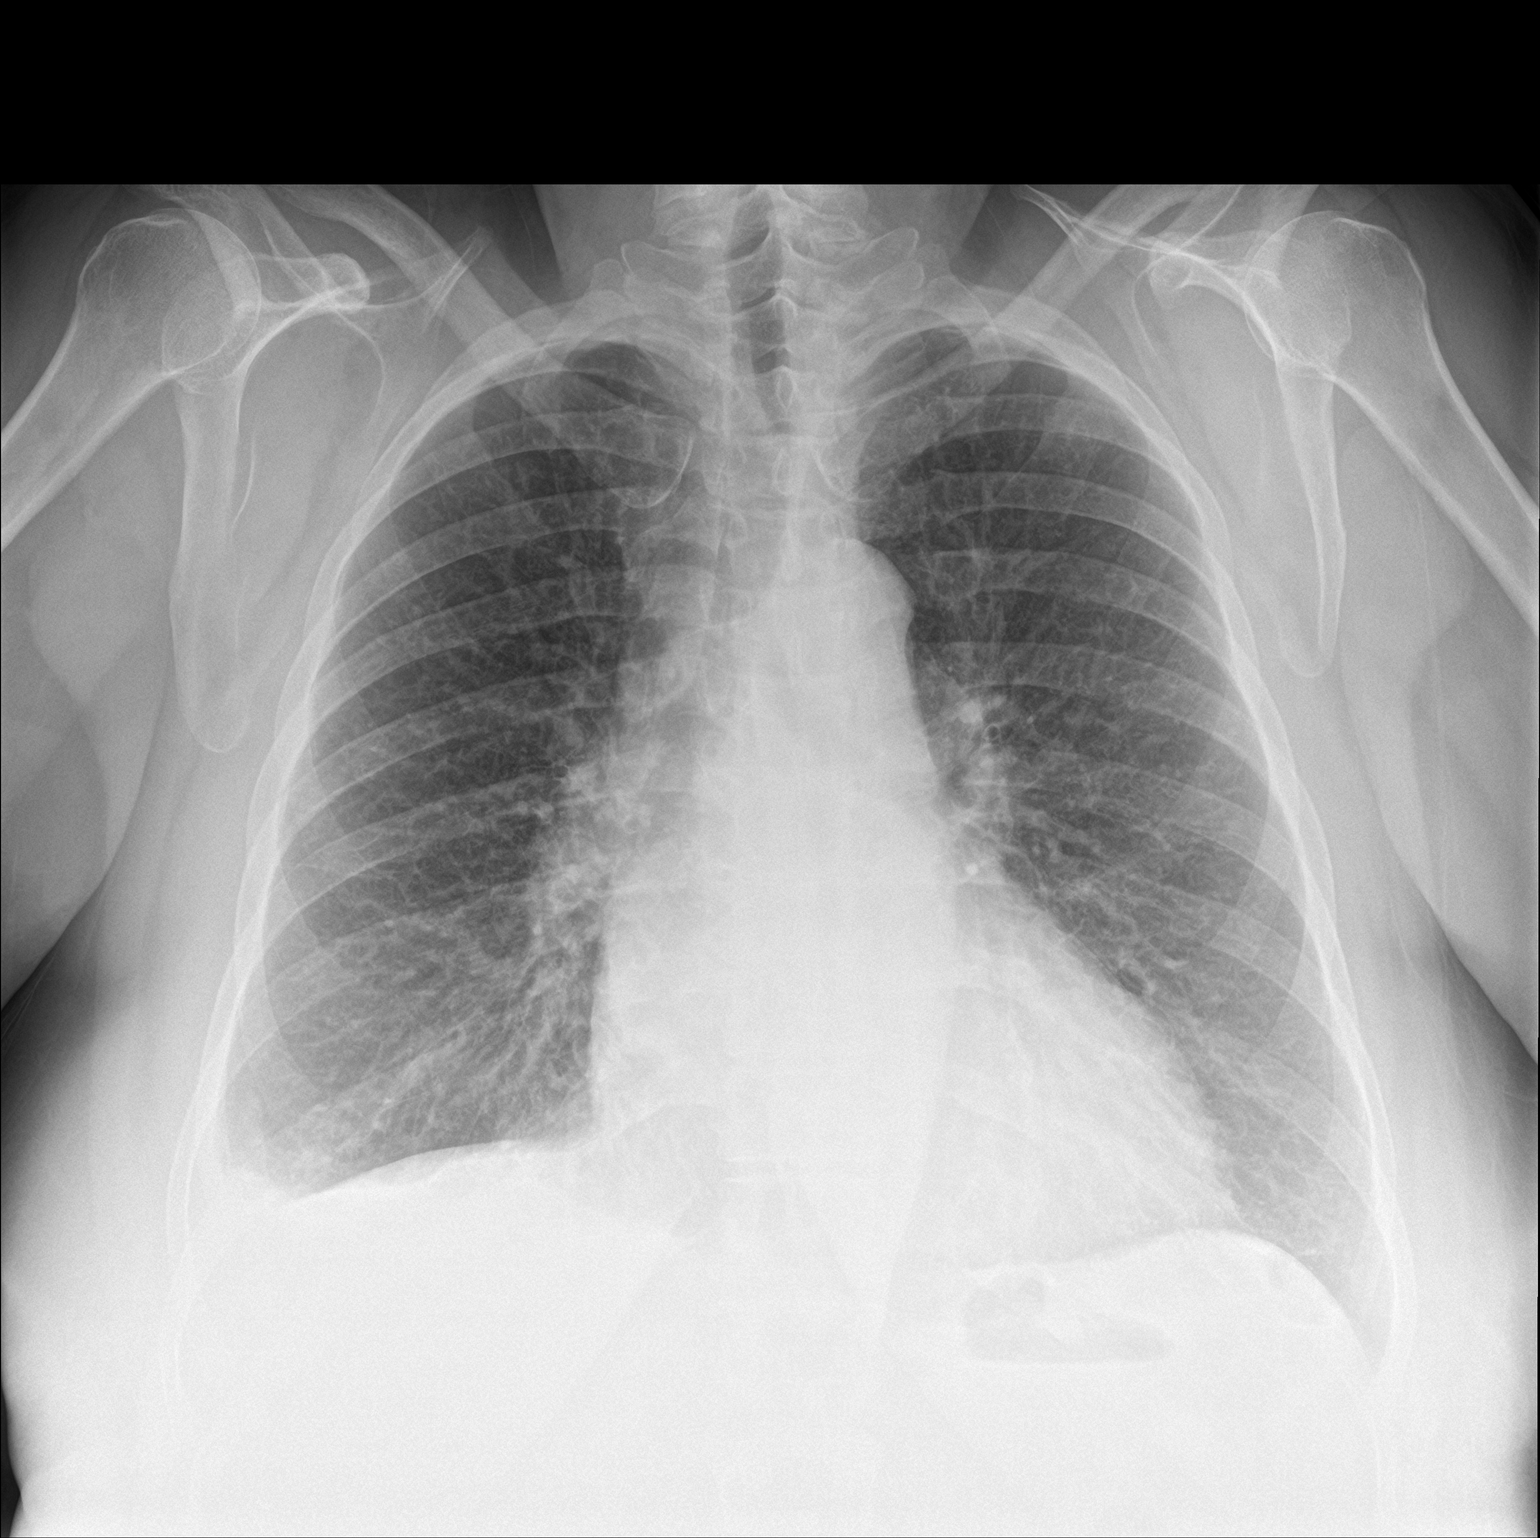

[chest lat]
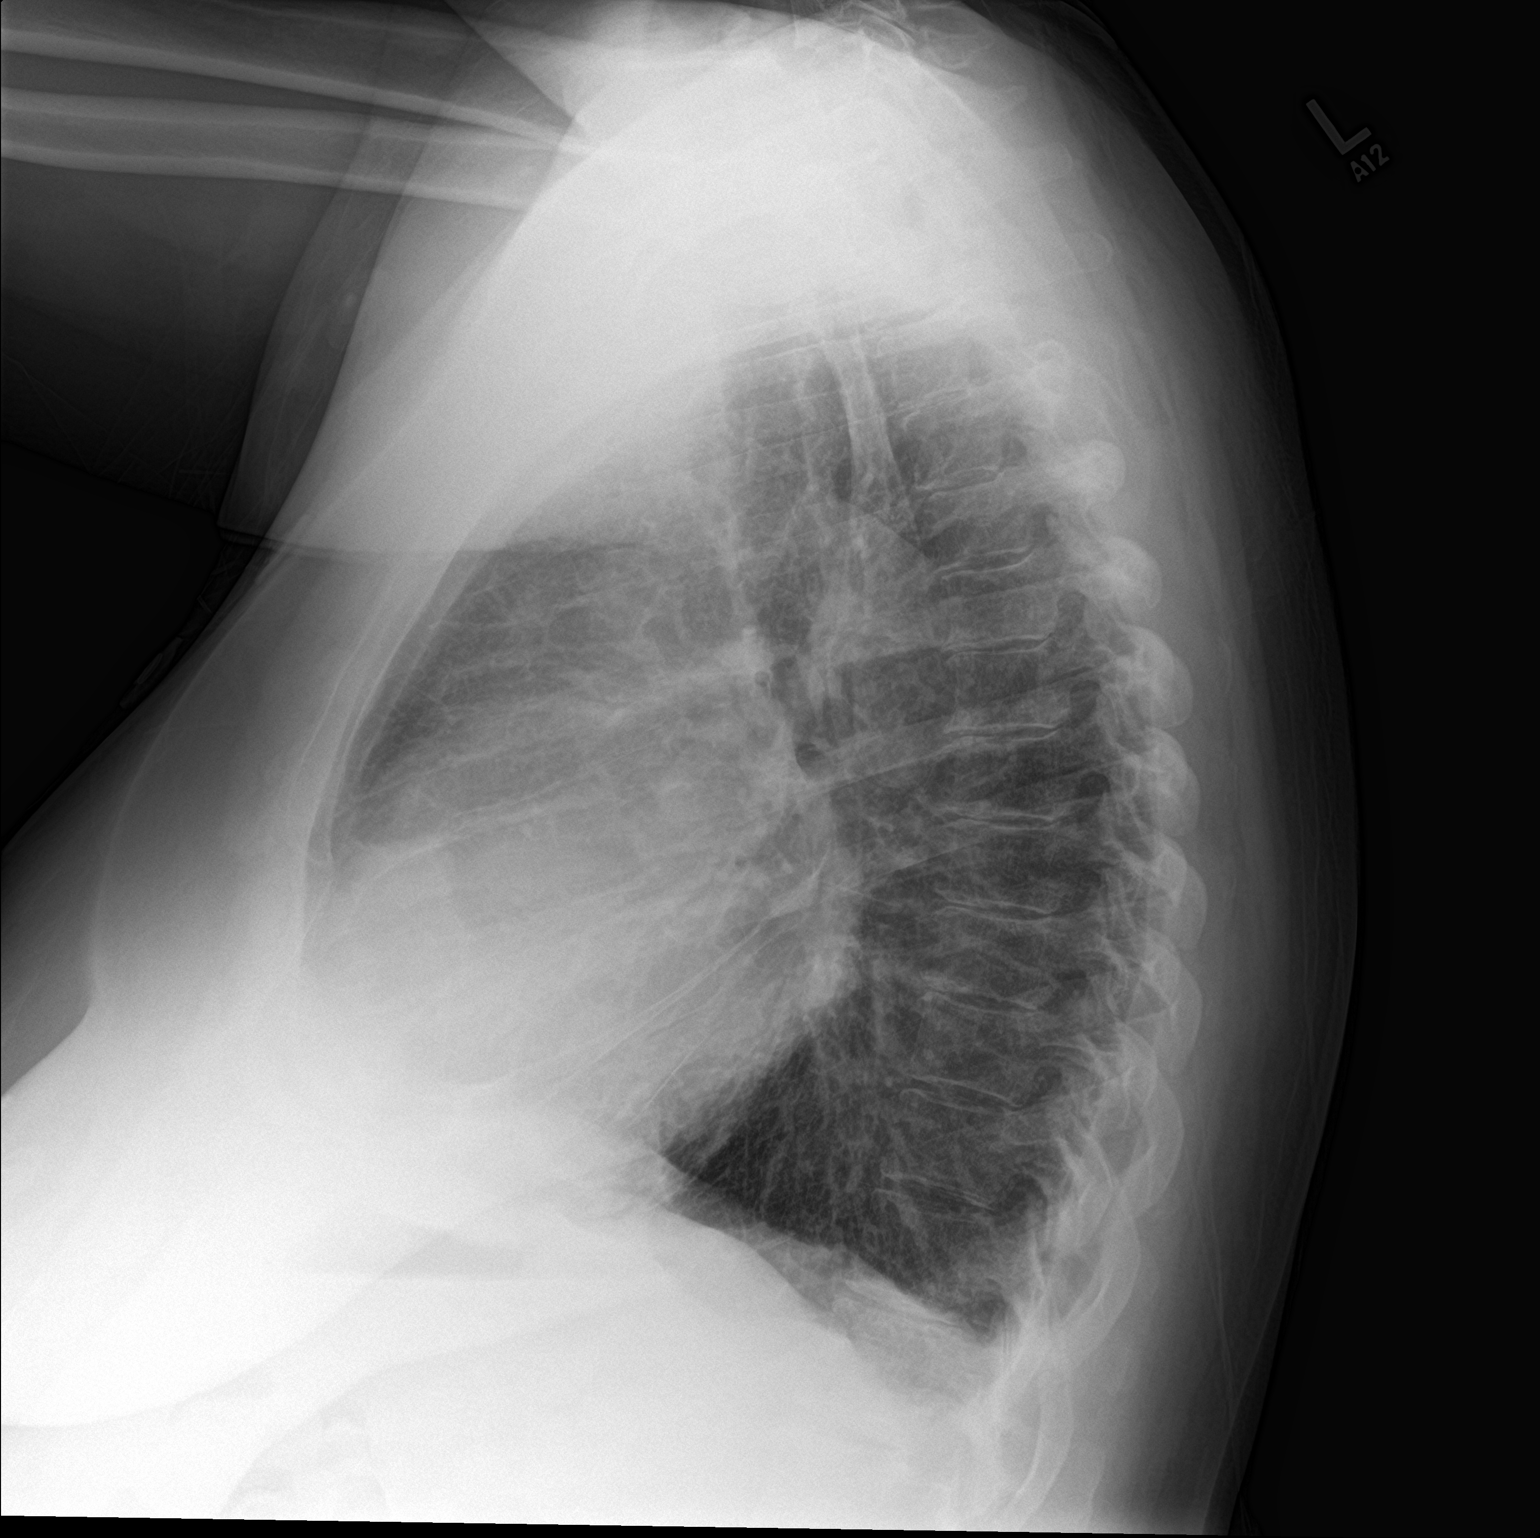

[2 of 2 positions shown; findings below may reference images not displayed]

FINDINGS: The cardiac silhouette is mildly enlarged. Mediastinal contours
appear intact. Tortuosity of the aorta.

There is no evidence pneumothorax. Right pleural effusion versus
pleural thickening with airspace consolidation or pulmonary nodule
in the right costophrenic angle. Mildly increased interstitial
markings.

Osseous structures are without acute abnormality. Soft tissues are
grossly normal.
IMPRESSION: Enlarged cardiac silhouette.

Mild pulmonary vascular congestion.

Right pleural effusion versus pleural thickening with airspace
consolidation versus pulmonary nodule in the right costophrenic
angle.

## 2019-10-11 ENCOUNTER — Encounter (INDEPENDENT_AMBULATORY_CARE_PROVIDER_SITE_OTHER): Payer: Self-pay | Admitting: Family Medicine

## 2019-10-11 ENCOUNTER — Ambulatory Visit (INDEPENDENT_AMBULATORY_CARE_PROVIDER_SITE_OTHER): Payer: Medicare Other | Admitting: Family Medicine

## 2019-10-11 ENCOUNTER — Other Ambulatory Visit: Payer: Self-pay

## 2019-10-11 VITALS — BP 105/61 | HR 68 | Temp 98.3°F | Ht 65.0 in | Wt 242.0 lb

## 2019-10-11 DIAGNOSIS — E559 Vitamin D deficiency, unspecified: Secondary | ICD-10-CM

## 2019-10-11 DIAGNOSIS — Z6841 Body Mass Index (BMI) 40.0 and over, adult: Secondary | ICD-10-CM

## 2019-10-11 DIAGNOSIS — R7303 Prediabetes: Secondary | ICD-10-CM | POA: Diagnosis not present

## 2019-10-11 MED ORDER — METFORMIN HCL 500 MG PO TABS: 500.0000 mg | ORAL_TABLET | Freq: Two times a day (BID) | ORAL | 0 refills | Status: DC

## 2019-10-11 MED ORDER — VITAMIN D (ERGOCALCIFEROL) 1.25 MG (50000 UNIT) PO CAPS: ORAL_CAPSULE | ORAL | 0 refills | Status: DC

## 2019-10-15 NOTE — Progress Notes (Signed)
Chief Complaint:   OBESITY Levora is here to discuss her progress with her obesity treatment plan along with follow-up of her obesity related diagnoses. Alsie is on the Category 3 Plan +100 calories and states she is following her eating plan approximately 50% of the time. Saga states she is walking more and cleaning more.  Today's visit was #: 66 Starting weight: 249 lbs Starting date: 11/02/2017 Today's weight: 242 lbs Today's date: 10/11/2019 Total lbs lost to date: 7 lbs Total lbs lost since last in-office visit: 0  Interim History: Pollie returned from [place] on 10/06/2019 (5 day road trip).  She has had a difficult few days as her husband is currently hospitalized at [facility].  Life is, therefore, very busy.  Subjective:   1. Vitamin D deficiency Terence's Vitamin D level was 61.0 on 01/24/2019. She is currently taking prescription vitamin D 50,000 IU each week. She denies nausea, vomiting or muscle weakness.  2. Prediabetes Latha's last A1c was 5.8.  She has minimal GI upset, (except when overindulging in carbs).  She has not been able to follow the plan as strictly as she would have liked.  Lab Results  Component Value Date   HGBA1C 5.8 (H) 01/24/2019   Lab Results  Component Value Date   INSULIN 20.0 01/24/2019   INSULIN 24.4 09/21/2018   INSULIN 16.1 05/17/2018   INSULIN 8.5 11/02/2017   Assessment/Plan:   1. Vitamin D deficiency Low Vitamin D level contributes to fatigue and are associated with obesity, breast, and colon cancer. She agrees to continue to take prescription Vitamin D @50 ,000 IU every week and will follow-up for routine testing of Vitamin D, at least 2-3 times per year to avoid over-replacement. - Vitamin D, Ergocalciferol, (DRISDOL) 1.25 MG (50000 UNIT) CAPS capsule; Take 1 every 14 days  Dispense: 2 capsule; Refill: 0  2. Prediabetes Eisley will continue to work on weight loss, exercise, and decreasing simple carbohydrates to  help decrease the risk of diabetes.  - metFORMIN (GLUCOPHAGE) 500 MG tablet; Take 1 tablet (500 mg total) by mouth 2 (two) times daily with a meal.  Dispense: 180 tablet; Refill: 0  3. Class 3 severe obesity with serious comorbidity and body mass index (BMI) of 40.0 to 44.9 in adult, unspecified obesity type (HCC) Yaslin is currently in the action stage of change. As such, her goal is to continue with weight loss efforts. She has agreed to the Category 3 Plan +100 calories.   Exercise goals: As is.  Behavioral modification strategies: increasing lean protein intake, meal planning and cooking strategies, better snacking choices and emotional eating strategies.  Shayden has agreed to follow-up with our clinic in 2 weeks. She was informed of the importance of frequent follow-up visits to maximize her success with intensive lifestyle modifications for her multiple health conditions.   Objective:   Blood pressure 105/61, pulse 68, temperature 98.3 F (36.8 C), temperature source Oral, height 5\' 5"  (1.651 m), weight 242 lb (109.8 kg), SpO2 95 %. Body mass index is 40.27 kg/m.  General: Cooperative, alert, well developed, in no acute distress. HEENT: Conjunctivae and lids unremarkable. Cardiovascular: Regular rhythm.  Lungs: Normal work of breathing. Neurologic: No focal deficits.   Lab Results  Component Value Date   CREATININE 0.83 04/19/2019   BUN 19 04/19/2019   NA 137 04/19/2019   K 4.3 04/19/2019   CL 102 04/19/2019   CO2 23 04/19/2019   Lab Results  Component Value Date  ALT 11 01/24/2019   AST 20 01/24/2019   ALKPHOS 88 01/24/2019   BILITOT 0.3 01/24/2019   Lab Results  Component Value Date   HGBA1C 5.8 (H) 01/24/2019   HGBA1C 5.8 (H) 09/21/2018   HGBA1C 6.0 (H) 05/17/2018   HGBA1C 5.8 (H) 11/02/2017   HGBA1C 5.8 (H) 06/26/2017   Lab Results  Component Value Date   INSULIN 20.0 01/24/2019   INSULIN 24.4 09/21/2018   INSULIN 16.1 05/17/2018   INSULIN 8.5  11/02/2017   Lab Results  Component Value Date   TSH 2.200 03/27/2019   Lab Results  Component Value Date   CHOL 160 01/24/2019   HDL 31 (L) 01/24/2019   LDLCALC 94 01/24/2019   TRIG 174 (H) 01/24/2019   CHOLHDL 5.4 06/26/2017   Lab Results  Component Value Date   WBC 8.5 03/08/2018   HGB 14.0 03/08/2018   HCT 42.2 03/08/2018   MCV 95 03/08/2018   PLT 256 03/08/2018   Obesity Behavioral Intervention Documentation for Insurance:   Approximately 15 minutes were spent on the discussion below.  ASK: We discussed the diagnosis of obesity with Juanisha today and Makiyla agreed to give [name] permission to discuss obesity behavioral modification therapy today.  ASSESS: Shalamar has the diagnosis of obesity and her BMI today is 40.3. Khadra is in the action stage of change.   ADVISE: Morena was educated on the multiple health risks of obesity as well as the benefit of weight loss to improve her health. She was advised of the need for long term treatment and the importance of lifestyle modifications to improve her current health and to decrease her risk of future health problems.  AGREE: Multiple dietary modification options and treatment options were discussed and Arthella agreed to follow the recommendations documented in the above note.  ARRANGE: Khadisha was educated on the importance of frequent visits to treat obesity as outlined per CMS and USPSTF guidelines and agreed to schedule her next follow up appointment today.  Attestation Statements:   Reviewed by clinician on day of visit: allergies, medications, problem list, medical history, surgical history, family history, social history, and previous encounter notes.  I, Water quality scientist, CMA, am acting as transcriptionist for [name], MD.  I have reviewed the above documentation for accuracy and completeness, and I agree with the above. - Jinny Blossom, MD

## 2019-10-17 ENCOUNTER — Ambulatory Visit (HOSPITAL_COMMUNITY)
Admission: RE | Admit: 2019-10-17 | Discharge: 2019-10-17 | Disposition: A | Discharge status: Home / Self Care | Payer: Medicare Other | Source: Ambulatory Visit | Attending: Nurse Practitioner | Admitting: Nurse Practitioner

## 2019-10-17 ENCOUNTER — Other Ambulatory Visit: Payer: Self-pay

## 2019-10-17 ENCOUNTER — Encounter (HOSPITAL_COMMUNITY): Payer: Self-pay | Admitting: Nurse Practitioner

## 2019-10-17 VITALS — BP 120/66 | HR 60 | Ht 65.0 in | Wt 246.6 lb

## 2019-10-17 DIAGNOSIS — I110 Hypertensive heart disease with heart failure: Secondary | ICD-10-CM | POA: Diagnosis not present

## 2019-10-17 DIAGNOSIS — I5022 Chronic systolic (congestive) heart failure: Secondary | ICD-10-CM | POA: Diagnosis not present

## 2019-10-17 DIAGNOSIS — Z6841 Body Mass Index (BMI) 40.0 and over, adult: Secondary | ICD-10-CM | POA: Insufficient documentation

## 2019-10-17 DIAGNOSIS — D6869 Other thrombophilia: Secondary | ICD-10-CM

## 2019-10-17 DIAGNOSIS — Z7984 Long term (current) use of oral hypoglycemic drugs: Secondary | ICD-10-CM | POA: Insufficient documentation

## 2019-10-17 DIAGNOSIS — Z8249 Family history of ischemic heart disease and other diseases of the circulatory system: Secondary | ICD-10-CM | POA: Diagnosis not present

## 2019-10-17 DIAGNOSIS — E669 Obesity, unspecified: Secondary | ICD-10-CM | POA: Insufficient documentation

## 2019-10-17 DIAGNOSIS — M797 Fibromyalgia: Secondary | ICD-10-CM | POA: Insufficient documentation

## 2019-10-17 DIAGNOSIS — G4733 Obstructive sleep apnea (adult) (pediatric): Secondary | ICD-10-CM | POA: Insufficient documentation

## 2019-10-17 DIAGNOSIS — R0602 Shortness of breath: Secondary | ICD-10-CM | POA: Diagnosis not present

## 2019-10-17 DIAGNOSIS — I4819 Other persistent atrial fibrillation: Secondary | ICD-10-CM | POA: Diagnosis not present

## 2019-10-17 DIAGNOSIS — M1990 Unspecified osteoarthritis, unspecified site: Secondary | ICD-10-CM | POA: Insufficient documentation

## 2019-10-17 DIAGNOSIS — Z79899 Other long term (current) drug therapy: Secondary | ICD-10-CM | POA: Insufficient documentation

## 2019-10-17 DIAGNOSIS — Z7901 Long term (current) use of anticoagulants: Secondary | ICD-10-CM | POA: Insufficient documentation

## 2019-10-17 LAB — CBC
HCT: 40.9 % (ref 36.0–46.0)
Hemoglobin: 13.5 g/dL (ref 12.0–15.0)
MCH: 30.9 pg (ref 26.0–34.0)
MCHC: 33 g/dL (ref 30.0–36.0)
MCV: 93.6 fL (ref 80.0–100.0)
Platelets: 246 10*3/uL (ref 150–400)
RBC: 4.37 MIL/uL (ref 3.87–5.11)
RDW: 12.3 % (ref 11.5–15.5)
WBC: 6.8 10*3/uL (ref 4.0–10.5)
nRBC: 0 % (ref 0.0–0.2)

## 2019-10-17 LAB — BASIC METABOLIC PANEL
Anion gap: 12 (ref 5–15)
BUN: 16 mg/dL (ref 8–23)
CO2: 24 mmol/L (ref 22–32)
Calcium: 9.5 mg/dL (ref 8.9–10.3)
Chloride: 103 mmol/L (ref 98–111)
Creatinine, Ser: 0.78 mg/dL (ref 0.44–1.00)
GFR calc Af Amer: >60 mL/min (ref >60)
GFR calc non Af Amer: >60 mL/min (ref >60)
Glucose, Bld: 96 mg/dL (ref 70–99)
Potassium: 4.3 mmol/L (ref 3.5–5.1)
Sodium: 139 mmol/L (ref 135–145)

## 2019-10-17 LAB — MAGNESIUM: Magnesium: 2.3 mg/dL (ref 1.7–2.4)

## 2019-10-17 MED ORDER — POTASSIUM CHLORIDE CRYS ER 20 MEQ PO TBCR: 20.0000 meq | EXTENDED_RELEASE_TABLET | Freq: Every day | ORAL | Status: DC

## 2019-10-17 NOTE — Progress Notes (Signed)
Primary Care Physician: Wallie Char, FNP Referring Physician:Dr. [name] is a 64 y.o. female with a h/o persistent afib that was having  breakthrough afib with Tikosyn and had an afib ablation 03/09/18 with [name]. She also had foot surgery last summer and had more paroxysmal afib during that time.  The afib  calmed down now that her foot surgery stress has resolved. She remains  on eliquis 5 mg bid with a CHA2DS2VASc score of 3( HF, female, pre -diabetes)  F/u in afib clinic 5/12, She spent most of the winter in [place] with her husband visiting their daughter. Her husband has developed melanoma and his health is poor at this time. She  has been very  worried about him. She  had a lot of afib while in [place] and the elevation increased her issues with dyspnea. She saw a cardiologist while there and her lasix was increased as well as metoprolol added. She remains on dofetilide 125 mcg bid. An echo was done and she was told something about the pump of the heart. She had an EF of 40-45% before SR was restored with dofetilide, it did normalize with SR. She remains on eliquis 5 mg bid . She had a knee injection today and may have to have knee replacement in the future.   Today, she denies symptoms of palpitations, chest pain, shortness of breath, orthopnea, PND, lower extremity edema, dizziness, presyncope, syncope, or neurologic sequela. The patient is tolerating medications without difficulties and is otherwise without complaint today.   Past Medical History:  Diagnosis Date  . Asthma   . Atrial fibrillation ([place])   . Back pain   . Diastolic dysfunction   . Fibromyalgia   . Gestational diabetes   . Hypertension   . Knee pain   . Osteoarthritis   . Persistent atrial fibrillation ([place])   . Sleep apnea    wears oral appliance  . Visit for monitoring Tikosyn therapy 02/07/2018   Past Surgical History:  Procedure Laterality Date  . ABDOMINAL HYSTERECTOMY    .  ABLATION OF DYSRHYTHMIC FOCUS  03/09/2018  . APPENDECTOMY    . ATRIAL FIBRILLATION ABLATION N/A 03/09/2018   Procedure: ATRIAL FIBRILLATION ABLATION;  Surgeon: [name], MD;  Location: [place] [facility];  Service: Cardiovascular;  Laterality: N/A;  . CARDIOVERSION N/A 07/04/2017   Procedure: CARDIOVERSION;  Surgeon: [name], MD;  Location: [facility] ENDOSCOPY;  Service: Cardiovascular;  Laterality: N/A;  . CARDIOVERSION N/A 09/06/2017   Procedure: CARDIOVERSION;  Surgeon: [name], MD;  Location: [facility] ENDOSCOPY;  Service: Cardiovascular;  Laterality: N/A;  . CHOLECYSTECTOMY N/A 02/24/2015   Procedure: LAPAROSCOPIC CHOLECYSTECTOMY;  Surgeon: [name], MD;  Location: [place];  Service: General;  Laterality: N/A;  . EXCISION HAGLUND'S DEFORMITY WITH ACHILLES TENDON REPAIR Left 12/21/2018   Procedure: Left Achilles Tendon Debridement and Reconstruction; Excision of Haglund Deformity;  Surgeon: [name], MD;  Location: [place];  Service: Orthopedics;  Laterality: Left;  [name] GASTROCNEMIUS RECESSION Left 12/21/2018   Procedure: Left Gastroc Recession;  Surgeon: [name], MD;  Location: [place];  Service: Orthopedics;  Laterality: Left;  . RIGHT/LEFT HEART CATH AND CORONARY ANGIOGRAPHY N/A 08/09/2017   Procedure: RIGHT/LEFT HEART CATH AND CORONARY ANGIOGRAPHY;  Surgeon: [name], MD;  Location: [place] [facility];  Service: Cardiovascular;  Laterality: N/A;  . TEE WITHOUT CARDIOVERSION N/A 07/04/2017   Procedure: TRANSESOPHAGEAL ECHOCARDIOGRAM (TEE);  Surgeon: [name],  MD;  Location: [place];  Service: Cardiovascular;  Laterality: N/A;  . TEE WITHOUT CARDIOVERSION N/A 03/09/2018   Procedure: TRANSESOPHAGEAL ECHOCARDIOGRAM (TEE);  Surgeon: [name], MD;  Location: [facility] ENDOSCOPY;  Service: Cardiovascular;  Laterality: N/A;    Current Outpatient Medications  Medication Sig Dispense Refill  . acetaminophen (TYLENOL) 325 MG tablet Take  325 mg by mouth as needed for moderate pain or headache.     [name] apixaban (ELIQUIS) 5 MG TABS tablet Take 1 tablet (5 mg total) by mouth 2 (two) times daily. 180 tablet 3  . diltiazem (CARDIZEM CD) 120 MG 24 hr capsule Take 2 tablets in the AM and 1 tablet in the PM 270 capsule 2  . dofetilide (TIKOSYN) 125 MCG capsule Take 1 capsule (125 mcg total) by mouth 2 (two) times daily. 180 capsule 2  . furosemide (LASIX) 40 MG tablet Take 20 mg by mouth 2 (two) times daily.     . Magnesium Gluconate 500 (27 Mg) MG TABS Take 1 tablet (500 mg total) by mouth daily. 30 tablet 0  . metFORMIN (GLUCOPHAGE) 500 MG tablet Take 1 tablet (500 mg total) by mouth 2 (two) times daily with a meal. 180 tablet 0  . metoprolol succinate (TOPROL-XL) 50 MG 24 hr tablet Take 50 mg by mouth daily. Take with or immediately following a meal.    . Multiple Vitamins-Minerals (WOMENS MULTIVITAMIN) TABS Take 1 tablet by mouth daily. 90 tablet 0  . potassium chloride SA (KLOR-CON) 20 MEQ tablet Take 1 tablet (20 mEq total) by mouth daily. Take an extra 1 tablet by mouth on days when you take the extra fluid pill (furosemide/Lasix).    . Vitamin D, Ergocalciferol, (DRISDOL) 1.25 MG (50000 UNIT) CAPS capsule Take 1 every 14 days 2 capsule 0   No current facility-administered medications for this encounter.    Allergies  Allergen Reactions  . Hydrocodone-Acetaminophen Nausea And Vomiting  . Other Other (See Comments)    Metals Polyester - including hospital gowns and sheets - allergic contact dermatitis  . Sulfur Hives    Social History   Socioeconomic History  . Marital status: Married    Spouse name: Not on file  . Number of children: 4  . Years of education: Not on file  . Highest education level: Not on file  Occupational History  . Occupation: Disabled/retired  Tobacco Use  . Smoking status: Never Smoker  . Smokeless tobacco: Never Used  Substance and Sexual Activity  . Alcohol use: Yes    Comment: little  .  Drug use: No  . Sexual activity: Not Currently  Other Topics Concern  . Not on file  Social History Narrative   Lives in [place]   Not working   Social Determinants of Health   Financial Resource Strain:   . Difficulty of Paying Living Expenses:   Food Insecurity:   . Worried About Charity fundraiser in the Last Year:   . Arboriculturist in the Last Year:   Transportation Needs:   . Film/video editor (Medical):   [name] Lack of Transportation (Non-Medical):   Physical Activity:   . Days of Exercise per Week:   . Minutes of Exercise per Session:   Stress:   . Feeling of Stress :   Social Connections:   . Frequency of Communication with Friends and Family:   . Frequency of Social Gatherings with Friends and Family:   . Attends Religious Services:   . Active Member  of Clubs or Organizations:   . Attends Archivist Meetings:   [name] Marital Status:   Intimate Partner Violence:   . Fear of Current or Ex-Partner:   . Emotionally Abused:   [name] Physically Abused:   . Sexually Abused:     Family History  Problem Relation Age of Onset  . Heart disease Mother   . Hypertension Mother   . Hyperlipidemia Mother   . Diabetes Mother   . Sudden death Mother   . Thyroid disease Mother   . Obesity Mother   . Heart disease Father   . Heart attack Father   . Hypertension Father   . Hyperlipidemia Father   . Diabetes Father     ROS- All systems are reviewed and negative except as per the HPI above  Physical Exam: Vitals:   10/17/19 1547  BP: 120/66  Pulse: 60  Weight: 111.9 kg  Height: 5\' 5"  (1.651 m)   Wt Readings from Last 3 Encounters:  10/17/19 111.9 kg  10/11/19 109.8 kg  05/14/19 107.5 kg    Labs: Lab Results  Component Value Date   NA 137 04/19/2019   K 4.3 04/19/2019   CL 102 04/19/2019   CO2 23 04/19/2019   GLUCOSE 108 (H) 04/19/2019   BUN 19 04/19/2019   CREATININE 0.83 04/19/2019   CALCIUM 9.3 04/19/2019   MG 2.4 04/19/2019   Lab Results   Component Value Date   INR 1.07 08/09/2017   Lab Results  Component Value Date   CHOL 160 01/24/2019   HDL 31 (L) 01/24/2019   LDLCALC 94 01/24/2019   TRIG 174 (H) 01/24/2019    GEN- The patient is well appearing obese female, alert and oriented x 3 today.   HEENT-head normocephalic, atraumatic, sclera clear, conjunctiva pink, hearing intact, trachea midline. Lungs- Clear to ausculation bilaterally, normal work of breathing Heart- Regular rate and rhythm, no murmurs, rubs or gallops  GI- soft, NT, ND, + BS Extremities- no clubbing, cyanosis, or edema MS- no significant deformity or atrophy Skin- no rash or lesion Psych- euthymic mood, full affect Neuro- strength and sensation are intact   EKG- SR with  HR of  60, pr int 198 ms, qrs int 82 ms, qtc 422 ms  Echo- 12/2018-1. The left ventricle has normal systolic function with an ejection fraction of 60-65%. The cavity size was normal. Left ventricular diastolic parameters were normal.  2. The right ventricle has normal systolic function. The cavity was normal. There is no increase in right ventricular wall thickness.  3. No evidence of mitral valve stenosis.  4. No stenosis of the aortic valve.  5. The aortic root and ascending aorta are normal in size and structure.  6. The average left ventricular global longitudinal strain is -23.7 %.  7. The interatrial septum was not assessed. Epic records reviewed   Assessment and Plan:  1. Persistent Atrial fibrillation Increase in afib while  in [place], [place] Feeling better now that she has been home fro a few weeks In SR today  Continue  Tikosyn, stable qtc. Continue diltiazem, metoprolol  daily Continue Eliquis 5 mg BID for CHA2DS2VASc score of 3. Bmet/mag/cbc today  2. Systolic dysfunction EF [id number] on TEE prior to return to SR 2019 Echo done in July 2020 showed normalization of EF EFpossibly declined while in [place] Avoid salt/limit fluids  Lasix as prescribed   3.  Obesity Body mass index is 41.04 kg/m. Lifestyle modification was discussed and encouraged including regular physical  activity and weight reduction. Has gained some weight over the winter months   4. OSA Now using oral appliance from Dr Ron Parker. Encouraged compliance.   Follow up with AF clinic in 3 months. I will schedule repeat echo at that visit   Butch Penny C. Saivon Prowse, [facility] [address] [place], [place] [address] [phone]

## 2019-10-19 ENCOUNTER — Other Ambulatory Visit (HOSPITAL_COMMUNITY): Payer: Self-pay | Admitting: *Deleted

## 2019-10-19 MED ORDER — DILTIAZEM HCL ER COATED BEADS 120 MG PO CP24: ORAL_CAPSULE | ORAL | 2 refills | Status: DC

## 2019-10-19 MED ORDER — DOFETILIDE 125 MCG PO CAPS: 125.0000 ug | ORAL_CAPSULE | Freq: Two times a day (BID) | ORAL | 2 refills | Status: DC

## 2019-10-19 MED ORDER — METOPROLOL SUCCINATE ER 50 MG PO TB24: 50.0000 mg | ORAL_TABLET | Freq: Every day | ORAL | 3 refills | Status: DC

## 2019-10-19 MED ORDER — APIXABAN 5 MG PO TABS: 5.0000 mg | ORAL_TABLET | Freq: Two times a day (BID) | ORAL | 3 refills | Status: DC

## 2019-10-26 ENCOUNTER — Encounter (HOSPITAL_COMMUNITY): Payer: Self-pay | Admitting: Nurse Practitioner

## 2019-10-26 NOTE — Addendum Note (Signed)
Encounter addended by: Sherran Needs, NP on: 10/26/2019 9:25 AM  Actions taken: Medication List reviewed, Problem List reviewed, Allergies reviewed, Level of Service modified

## 2019-11-06 ENCOUNTER — Ambulatory Visit (INDEPENDENT_AMBULATORY_CARE_PROVIDER_SITE_OTHER): Payer: Medicare Other | Admitting: Family Medicine

## 2019-11-07 ENCOUNTER — Ambulatory Visit (INDEPENDENT_AMBULATORY_CARE_PROVIDER_SITE_OTHER): Payer: Medicare Other | Admitting: Family Medicine

## 2019-11-07 ENCOUNTER — Encounter (INDEPENDENT_AMBULATORY_CARE_PROVIDER_SITE_OTHER): Payer: Self-pay | Admitting: Family Medicine

## 2019-11-07 ENCOUNTER — Other Ambulatory Visit: Payer: Self-pay

## 2019-11-07 VITALS — BP 126/69 | HR 71 | Temp 98.1°F | Ht 65.0 in | Wt 239.0 lb

## 2019-11-07 DIAGNOSIS — R7303 Prediabetes: Secondary | ICD-10-CM

## 2019-11-07 DIAGNOSIS — Z6839 Body mass index (BMI) 39.0-39.9, adult: Secondary | ICD-10-CM | POA: Diagnosis not present

## 2019-11-07 DIAGNOSIS — E559 Vitamin D deficiency, unspecified: Secondary | ICD-10-CM | POA: Diagnosis not present

## 2019-11-07 MED ORDER — VITAMIN D (ERGOCALCIFEROL) 1.25 MG (50000 UNIT) PO CAPS: ORAL_CAPSULE | ORAL | 0 refills | Status: DC

## 2019-11-07 MED ORDER — METFORMIN HCL 500 MG PO TABS: 500.0000 mg | ORAL_TABLET | Freq: Two times a day (BID) | ORAL | 0 refills | Status: DC

## 2019-11-07 NOTE — Progress Notes (Signed)
Chief Complaint:   OBESITY Christie Atkinson is here to discuss her progress with her obesity treatment plan along with follow-up of her obesity related diagnoses. Christie Atkinson is on the Category 3 Plan +100 calories and states she is following her eating plan approximately 80-90% of the time. Christie Atkinson states she has been walking constantly.  Today's visit was #: 6 Starting weight: 249 lbs Starting date: 11/02/2017 Today's weight: 239 lbs Today's date: 11/07/2019 Total lbs lost to date: 10 lbs Total lbs lost since last in-office visit: 3 lbs  Interim History: Christie Atkinson has been taking care of her husband, who is currently battling multiple myeloma.  She states she is feeling fatigued frequently and is noticing she has increased the protein in her meals and is having 1 sweet treat daily.  She voices that all of the activity she is doing has made her realize that she likely needs a knee replacement.  Subjective:   1. Prediabetes Christie Atkinson has a diagnosis of prediabetes based on her elevated HgA1c and was informed this puts her at greater risk of developing diabetes. She continues to work on diet and exercise to decrease her risk of diabetes. She denies nausea or hypoglycemia.  She is taking metformin.  Lab Results  Component Value Date   HGBA1C 5.8 (H) 01/24/2019   Lab Results  Component Value Date   INSULIN 20.0 01/24/2019   INSULIN 24.4 09/21/2018   INSULIN 16.1 05/17/2018   INSULIN 8.5 11/02/2017   2. Vitamin D deficiency Christie Atkinson's Vitamin D level was 61.0 on 01/24/2019. She is currently taking prescription vitamin D 50,000 IU every 14 days. She denies nausea, vomiting or muscle weakness.  She endorses fatigue.  She is taking vitamin D every 2 weeks.    Assessment/Plan:   1. Prediabetes Christie Atkinson will continue to work on weight loss, exercise, and decreasing simple carbohydrates to help decrease the risk of diabetes.  - metFORMIN (GLUCOPHAGE) 500 MG tablet; Take 1 tablet (500 mg total) by mouth  2 (two) times daily with a meal.  Dispense: 180 tablet; Refill: 0 - Comprehensive metabolic panel - Hemoglobin A1c - Insulin, random  2. Vitamin D deficiency Low Vitamin D level contributes to fatigue and are associated with obesity, breast, and colon cancer. She agrees to continue to take prescription Vitamin D _0 ,000 IU every 14 days and will follow-up for routine testing of Vitamin D, at least 2-3 times per year to avoid over-replacement. - Vitamin D, Ergocalciferol, (DRISDOL) 1.25 MG (50000 UNIT) CAPS capsule; Take 1 every 14 days  Dispense: 2 capsule; Refill: 0 - VITAMIN D 25 Hydroxy (Vit-D Deficiency, Fractures)  3. Class 2 severe obesity with serious comorbidity and body mass index (BMI) of 39.0 to 39.9 in adult, unspecified obesity type (HCC) Christie Atkinson is currently in the action stage of change. As such, her goal is to continue with weight loss efforts. She has agreed to the Category 3 Plan +100 calories.   Exercise goals: As is.  Behavioral modification strategies: increasing lean protein intake, meal planning and cooking strategies, keeping healthy foods in the home and planning for success.  Christie Atkinson has agreed to follow-up with our clinic in 3 weeks. She was informed of the importance of frequent follow-up visits to maximize her success with intensive lifestyle modifications for her multiple health conditions.   Christie Atkinson was informed we would discuss her lab results at her next visit unless there is a critical issue that needs to be addressed sooner. Christie Atkinson agreed to keep her next visit  at the agreed upon time to discuss these results.  Objective:   Blood pressure 126/69, pulse 71, temperature 98.1 F (36.7 C), temperature source Oral, height 5' 5" (1.651 m), weight 239 lb (108.4 kg), SpO2 96 %. Body mass index is 39.77 kg/m.  General: Cooperative, alert, well developed, in no acute distress. HEENT: Conjunctivae and lids unremarkable. Cardiovascular: Regular rhythm.  Lungs:  Normal work of breathing. Neurologic: No focal deficits.   Lab Results  Component Value Date   CREATININE 0.78 10/17/2019   BUN 16 10/17/2019   NA 139 10/17/2019   K 4.3 10/17/2019   CL 103 10/17/2019   CO2 24 10/17/2019   Lab Results  Component Value Date   ALT 11 01/24/2019   AST 20 01/24/2019   ALKPHOS 88 01/24/2019   BILITOT 0.3 01/24/2019   Lab Results  Component Value Date   HGBA1C 5.8 (H) 01/24/2019   HGBA1C 5.8 (H) 09/21/2018   HGBA1C 6.0 (H) 05/17/2018   HGBA1C 5.8 (H) 11/02/2017   HGBA1C 5.8 (H) 06/26/2017   Lab Results  Component Value Date   INSULIN 20.0 01/24/2019   INSULIN 24.4 09/21/2018   INSULIN 16.1 05/17/2018   INSULIN 8.5 11/02/2017   Lab Results  Component Value Date   TSH 2.200 03/27/2019   Lab Results  Component Value Date   CHOL 160 01/24/2019   HDL 31 (L) 01/24/2019   LDLCALC 94 01/24/2019   TRIG 174 (H) 01/24/2019   CHOLHDL 5.4 06/26/2017   Lab Results  Component Value Date   WBC 6.8 10/17/2019   HGB 13.5 10/17/2019   HCT 40.9 10/17/2019   MCV 93.6 10/17/2019   PLT 246 10/17/2019   Obesity Behavioral Intervention Documentation for Insurance:   Approximately 15 minutes were spent on the discussion below.  ASK: We discussed the diagnosis of obesity with Christie Atkinson today and Christie Atkinson agreed to give [name] permission to discuss obesity behavioral modification therapy today.  ASSESS: Christie Atkinson has the diagnosis of obesity and her BMI today is 39.8. Christie Atkinson is in the action stage of change.   ADVISE: Christie Atkinson was educated on the multiple health risks of obesity as well as the benefit of weight loss to improve her health. She was advised of the need for long term treatment and the importance of lifestyle modifications to improve her current health and to decrease her risk of future health problems.  AGREE: Multiple dietary modification options and treatment options were discussed and Christie Atkinson agreed to follow the recommendations documented in  the above note.  ARRANGE: Christie Atkinson was educated on the importance of frequent visits to treat obesity as outlined per CMS and USPSTF guidelines and agreed to schedule her next follow up appointment today.  Attestation Statements:   Reviewed by clinician on day of visit: allergies, medications, problem list, medical history, surgical history, family history, social history, and previous encounter notes.  I, Water quality scientist, CMA, am acting as transcriptionist for [name], MD.  I have reviewed the above documentation for accuracy and completeness, and I agree with the above. - Jinny Blossom, MD

## 2019-11-08 LAB — VITAMIN D 25 HYDROXY (VIT D DEFICIENCY, FRACTURES): Vit D, 25-Hydroxy: 49.5 ng/mL (ref 30.0–100.0)

## 2019-11-08 LAB — COMPREHENSIVE METABOLIC PANEL
ALT: 11 IU/L (ref 0–32)
AST: 20 IU/L (ref 0–40)
Albumin/Globulin Ratio: 1.2 (ref 1.2–2.2)
Albumin: 4.2 g/dL (ref 3.8–4.8)
Alkaline Phosphatase: 86 IU/L (ref 48–121)
BUN/Creatinine Ratio: 20 (ref 12–28)
BUN: 17 mg/dL (ref 8–27)
Bilirubin Total: 0.5 mg/dL (ref 0.0–1.2)
CO2: 21 mmol/L (ref 20–29)
Calcium: 9.4 mg/dL (ref 8.7–10.3)
Chloride: 102 mmol/L (ref 96–106)
Creatinine, Ser: 0.83 mg/dL (ref 0.57–1.00)
GFR calc Af Amer: 86 mL/min/{1.73_m2} (ref >59)
GFR calc non Af Amer: 75 mL/min/{1.73_m2} (ref >59)
Globulin, Total: 3.4 g/dL (ref 1.5–4.5)
Glucose: 101 mg/dL — ABNORMAL HIGH (ref 65–99)
Potassium: 4.2 mmol/L (ref 3.5–5.2)
Sodium: 140 mmol/L (ref 134–144)
Total Protein: 7.6 g/dL (ref 6.0–8.5)

## 2019-11-08 LAB — HEMOGLOBIN A1C
Est. average glucose Bld gHb Est-mCnc: 120 mg/dL
Hgb A1c MFr Bld: 5.8 % — ABNORMAL HIGH (ref 4.8–5.6)

## 2019-11-08 LAB — INSULIN, RANDOM: INSULIN: 17.9 u[IU]/mL (ref 2.6–24.9)

## 2019-11-22 ENCOUNTER — Ambulatory Visit (INDEPENDENT_AMBULATORY_CARE_PROVIDER_SITE_OTHER): Payer: Medicare Other | Admitting: Adult Health

## 2019-12-05 ENCOUNTER — Other Ambulatory Visit: Payer: Self-pay

## 2019-12-05 ENCOUNTER — Ambulatory Visit (INDEPENDENT_AMBULATORY_CARE_PROVIDER_SITE_OTHER): Payer: Medicare Other | Admitting: Family Medicine

## 2019-12-05 ENCOUNTER — Encounter (INDEPENDENT_AMBULATORY_CARE_PROVIDER_SITE_OTHER): Payer: Self-pay | Admitting: Family Medicine

## 2019-12-05 VITALS — BP 105/65 | HR 63 | Temp 98.0°F | Ht 65.0 in | Wt 241.0 lb

## 2019-12-05 DIAGNOSIS — R7303 Prediabetes: Secondary | ICD-10-CM | POA: Diagnosis not present

## 2019-12-05 DIAGNOSIS — E559 Vitamin D deficiency, unspecified: Secondary | ICD-10-CM

## 2019-12-05 DIAGNOSIS — Z6841 Body Mass Index (BMI) 40.0 and over, adult: Secondary | ICD-10-CM | POA: Diagnosis not present

## 2019-12-05 MED ORDER — METFORMIN HCL 500 MG PO TABS: 500.0000 mg | ORAL_TABLET | Freq: Two times a day (BID) | ORAL | 0 refills | Status: DC

## 2019-12-06 NOTE — Progress Notes (Signed)
Chief Complaint:   OBESITY Christie Atkinson is here to discuss her progress with her obesity treatment plan along with follow-up of her obesity related diagnoses. [name] is on the Category 3 Plan and states she is following her eating plan approximately 95% of the time. Kevona states she is walking for exercise.  Today's visit was #: 9 Starting weight: 249 lbs Starting date: 11/02/2017 Today's weight: 241 lbs Today's date: 12/05/2019 Total lbs lost to date: 8 lbs Total lbs lost since last in-office visit: 0  Interim History: Christie Atkinson's husband passed away 4 days ago.  She feels that her mind is going a mile a minutes.  She says she was very stressed prior to his passing.  She needs her metformin refilled.  She says the services for her husband were yesterday and today.  Subjective:   1. Prediabetes Erisha has a diagnosis of prediabetes based on her elevated HgA1c and was informed this puts her at greater risk of developing diabetes. She continues to work on diet and exercise to decrease her risk of diabetes. She denies nausea or hypoglycemia.  She is taking metformin, but not consistently.  Lab Results  Component Value Date   HGBA1C 5.8 (H) 11/07/2019   Lab Results  Component Value Date   INSULIN 17.9 11/07/2019   INSULIN 20.0 01/24/2019   INSULIN 24.4 09/21/2018   INSULIN 16.1 05/17/2018   INSULIN 8.5 11/02/2017   2. Vitamin D deficiency Christie Atkinson's Vitamin D level was 49.5 on 11/07/2019. She is currently taking prescription vitamin D 50,000 IU each week. She denies nausea, vomiting or muscle weakness.  She endorses fatigue.  Assessment/Plan:   1. Prediabetes Christie Atkinson will continue to work on weight loss, exercise, and decreasing simple carbohydrates to help decrease the risk of diabetes.  - metFORMIN (GLUCOPHAGE) 500 MG tablet; Take 1 tablet (500 mg total) by mouth 2 (two) times daily with a meal.  Dispense: 180 tablet; Refill: 0  2. Vitamin D deficiency Low Vitamin D level  contributes to fatigue and are associated with obesity, breast, and colon cancer. She agrees to change her prescription Vitamin D @50 ,000 IU to every other week dosing with her next refill and will follow-up for routine testing of Vitamin D, at least 2-3 times per year to avoid over-replacement.  3. Class 3 severe obesity with serious comorbidity and body mass index (BMI) of 40.0 to 44.9 in adult, unspecified obesity type (HCC) Christie Atkinson is currently in the action stage of change. As such, her goal is to continue with weight loss efforts. She has agreed to the Category 3 Plan +100 calories.   Exercise goals: All adults should avoid inactivity. Some physical activity is better than none, and adults who participate in any amount of physical activity gain some health benefits.  Behavioral modification strategies: increasing lean protein intake, increasing vegetables, meal planning and cooking strategies and keeping healthy foods in the home.  Christie Atkinson has agreed to follow-up with our clinic in 2 weeks. She was informed of the importance of frequent follow-up visits to maximize her success with intensive lifestyle modifications for her multiple health conditions.   Objective:   Blood pressure 105/65, pulse 63, temperature 98 F (36.7 C), temperature source Oral, height 5\' 5"  (1.651 m), weight 241 lb (109.3 kg), SpO2 96 %. Body mass index is 40.1 kg/m.  General: Cooperative, alert, well developed, in no acute distress. HEENT: Conjunctivae and lids unremarkable. Cardiovascular: Regular rhythm.  Lungs: Normal work of breathing. Neurologic: No focal deficits.  Lab Results  Component Value Date   CREATININE 0.83 11/07/2019   BUN 17 11/07/2019   NA 140 11/07/2019   K 4.2 11/07/2019   CL 102 11/07/2019   CO2 21 11/07/2019   Lab Results  Component Value Date   ALT 11 11/07/2019   AST 20 11/07/2019   ALKPHOS 86 11/07/2019   BILITOT 0.5 11/07/2019   Lab Results  Component Value Date    HGBA1C 5.8 (H) 11/07/2019   HGBA1C 5.8 (H) 01/24/2019   HGBA1C 5.8 (H) 09/21/2018   HGBA1C 6.0 (H) 05/17/2018   HGBA1C 5.8 (H) 11/02/2017   Lab Results  Component Value Date   INSULIN 17.9 11/07/2019   INSULIN 20.0 01/24/2019   INSULIN 24.4 09/21/2018   INSULIN 16.1 05/17/2018   INSULIN 8.5 11/02/2017   Lab Results  Component Value Date   TSH 2.200 03/27/2019   Lab Results  Component Value Date   CHOL 160 01/24/2019   HDL 31 (L) 01/24/2019   LDLCALC 94 01/24/2019   TRIG 174 (H) 01/24/2019   CHOLHDL 5.4 06/26/2017   Lab Results  Component Value Date   WBC 6.8 10/17/2019   HGB 13.5 10/17/2019   HCT 40.9 10/17/2019   MCV 93.6 10/17/2019   PLT 246 10/17/2019   Obesity Behavioral Intervention Documentation for Insurance:   Approximately 15 minutes were spent on the discussion below.  ASK: We discussed the diagnosis of obesity with [name] today and [name] agreed to give [name] permission to discuss obesity behavioral modification therapy today.  ASSESS: Chasitty has the diagnosis of obesity and her BMI today is 40.2. [name] is in the action stage of change.   ADVISE: [name] was educated on the multiple health risks of obesity as well as the benefit of weight loss to improve her health. She was advised of the need for long term treatment and the importance of lifestyle modifications to improve her current health and to decrease her risk of future health problems.  AGREE: Multiple dietary modification options and treatment options were discussed and [name] agreed to follow the recommendations documented in the above note.  ARRANGE: [name] was educated on the importance of frequent visits to treat obesity as outlined per CMS and USPSTF guidelines and agreed to schedule her next follow up appointment today.  Attestation Statements:   Reviewed by clinician on day of visit: allergies, medications, problem list, medical history, surgical history, family history, social  history, and previous encounter notes.  I, Water quality scientist, CMA, am acting as transcriptionist for [name], MD.  I have reviewed the above documentation for accuracy and completeness, and I agree with the above. - Jinny Blossom, MD

## 2019-12-11 ENCOUNTER — Other Ambulatory Visit (INDEPENDENT_AMBULATORY_CARE_PROVIDER_SITE_OTHER): Payer: Self-pay

## 2019-12-11 ENCOUNTER — Telehealth (INDEPENDENT_AMBULATORY_CARE_PROVIDER_SITE_OTHER): Payer: Self-pay | Admitting: Family Medicine

## 2019-12-11 DIAGNOSIS — M25522 Pain in left elbow: Secondary | ICD-10-CM | POA: Diagnosis not present

## 2019-12-11 DIAGNOSIS — M25532 Pain in left wrist: Secondary | ICD-10-CM | POA: Diagnosis not present

## 2019-12-11 NOTE — Telephone Encounter (Signed)
Sinclair Grooms, eventhough we spoke on the phone, I still wanted to respond to message. Since you picked up your meds at the [place] [facility] today, we can see about refills at your next visit in 2 weeks. Thanks, [place], CMA

## 2019-12-11 NOTE — Telephone Encounter (Signed)
Patient needs a prescription for Metformin called into [facility]  [facility]'t had any [facility] for a week.  Will you please let the patient know when the prescription is called in?

## 2019-12-19 ENCOUNTER — Ambulatory Visit (INDEPENDENT_AMBULATORY_CARE_PROVIDER_SITE_OTHER): Payer: Medicare Other | Admitting: Family Medicine

## 2020-01-09 DIAGNOSIS — R0602 Shortness of breath: Secondary | ICD-10-CM | POA: Diagnosis not present

## 2020-01-09 DIAGNOSIS — R5383 Other fatigue: Secondary | ICD-10-CM | POA: Diagnosis not present

## 2020-01-09 DIAGNOSIS — R635 Abnormal weight gain: Secondary | ICD-10-CM | POA: Diagnosis not present

## 2020-01-09 DIAGNOSIS — I4891 Unspecified atrial fibrillation: Secondary | ICD-10-CM | POA: Diagnosis not present

## 2020-01-09 DIAGNOSIS — R7303 Prediabetes: Secondary | ICD-10-CM | POA: Diagnosis not present

## 2020-01-09 DIAGNOSIS — Z79899 Other long term (current) drug therapy: Secondary | ICD-10-CM | POA: Diagnosis not present

## 2020-01-17 ENCOUNTER — Encounter (HOSPITAL_COMMUNITY): Payer: Self-pay | Admitting: Nurse Practitioner

## 2020-01-17 ENCOUNTER — Other Ambulatory Visit: Payer: Self-pay

## 2020-01-17 ENCOUNTER — Ambulatory Visit (HOSPITAL_COMMUNITY)
Admission: RE | Admit: 2020-01-17 | Discharge: 2020-01-17 | Disposition: A | Discharge status: Home / Self Care | Payer: Medicare Other | Source: Ambulatory Visit | Attending: Nurse Practitioner | Admitting: Nurse Practitioner

## 2020-01-17 VITALS — BP 118/72 | HR 70 | Ht 65.0 in | Wt 234.2 lb

## 2020-01-17 DIAGNOSIS — Z8249 Family history of ischemic heart disease and other diseases of the circulatory system: Secondary | ICD-10-CM | POA: Insufficient documentation

## 2020-01-17 DIAGNOSIS — Z9049 Acquired absence of other specified parts of digestive tract: Secondary | ICD-10-CM | POA: Diagnosis not present

## 2020-01-17 DIAGNOSIS — J45909 Unspecified asthma, uncomplicated: Secondary | ICD-10-CM | POA: Diagnosis not present

## 2020-01-17 DIAGNOSIS — Z885 Allergy status to narcotic agent status: Secondary | ICD-10-CM | POA: Diagnosis not present

## 2020-01-17 DIAGNOSIS — I4819 Other persistent atrial fibrillation: Secondary | ICD-10-CM | POA: Diagnosis not present

## 2020-01-17 DIAGNOSIS — I110 Hypertensive heart disease with heart failure: Secondary | ICD-10-CM | POA: Diagnosis not present

## 2020-01-17 DIAGNOSIS — Z9861 Coronary angioplasty status: Secondary | ICD-10-CM | POA: Diagnosis not present

## 2020-01-17 DIAGNOSIS — G4733 Obstructive sleep apnea (adult) (pediatric): Secondary | ICD-10-CM | POA: Insufficient documentation

## 2020-01-17 DIAGNOSIS — M1990 Unspecified osteoarthritis, unspecified site: Secondary | ICD-10-CM | POA: Diagnosis not present

## 2020-01-17 DIAGNOSIS — E669 Obesity, unspecified: Secondary | ICD-10-CM | POA: Diagnosis not present

## 2020-01-17 DIAGNOSIS — R7303 Prediabetes: Secondary | ICD-10-CM | POA: Insufficient documentation

## 2020-01-17 DIAGNOSIS — M797 Fibromyalgia: Secondary | ICD-10-CM | POA: Insufficient documentation

## 2020-01-17 DIAGNOSIS — Z79899 Other long term (current) drug therapy: Secondary | ICD-10-CM | POA: Insufficient documentation

## 2020-01-17 DIAGNOSIS — I5020 Unspecified systolic (congestive) heart failure: Secondary | ICD-10-CM | POA: Diagnosis not present

## 2020-01-17 DIAGNOSIS — D6869 Other thrombophilia: Secondary | ICD-10-CM | POA: Diagnosis not present

## 2020-01-17 DIAGNOSIS — Z7901 Long term (current) use of anticoagulants: Secondary | ICD-10-CM | POA: Insufficient documentation

## 2020-01-17 DIAGNOSIS — Z9889 Other specified postprocedural states: Secondary | ICD-10-CM | POA: Diagnosis not present

## 2020-01-17 LAB — BASIC METABOLIC PANEL
Anion gap: 10 (ref 5–15)
BUN: 12 mg/dL (ref 8–23)
CO2: 22 mmol/L (ref 22–32)
Calcium: 9.2 mg/dL (ref 8.9–10.3)
Chloride: 104 mmol/L (ref 98–111)
Creatinine, Ser: 0.76 mg/dL (ref 0.44–1.00)
GFR calc Af Amer: >60 mL/min (ref >60)
GFR calc non Af Amer: >60 mL/min (ref >60)
Glucose, Bld: 116 mg/dL — ABNORMAL HIGH (ref 70–99)
Potassium: 3.4 mmol/L — ABNORMAL LOW (ref 3.5–5.1)
Sodium: 136 mmol/L (ref 135–145)

## 2020-01-17 LAB — MAGNESIUM: Magnesium: 2.1 mg/dL (ref 1.7–2.4)

## 2020-01-17 NOTE — Progress Notes (Signed)
Primary Care Physician: Wallie Char, FNP Referring Physician:Dr. [name] is a 64 y.o. female with a h/o persistent afib that was having  breakthrough afib with Tikosyn and had an afib ablation 03/09/18 with [name]. She also had foot surgery last summer and had more paroxysmal afib during that time.  The afib has calmed down now that her foot surgery stress has resolved. She remains  on eliquis 5 mg bid with a CHA2DS2VASc score of 3( HF, female, pre -diabetes).   F/u in afib clinic, 01/17/20. She reports that she has done very well since being back in [place] for a couple of months. She spent the winter in [place] and had some  swelling  of LLE's and shortness of breath there.  This has resolved since returning home. Unfortunately, her husband passed from multiple myeloma one mont ago.  She continues on dofetilide 125 mcg bid.   Today, she denies symptoms of palpitations, chest pain, shortness of breath, orthopnea, PND, lower extremity edema, dizziness, presyncope, syncope, or neurologic sequela. The patient is tolerating medications without difficulties and is otherwise without complaint today.   Past Medical History:  Diagnosis Date  . Asthma   . Atrial fibrillation ([place])   . Back pain   . Diastolic dysfunction   . Fibromyalgia   . Gestational diabetes   . Hypertension   . Knee pain   . Osteoarthritis   . Persistent atrial fibrillation ([place])   . Sleep apnea    wears oral appliance  . Visit for monitoring Tikosyn therapy 02/07/2018   Past Surgical History:  Procedure Laterality Date  . ABDOMINAL HYSTERECTOMY    . ABLATION OF DYSRHYTHMIC FOCUS  03/09/2018  . APPENDECTOMY    . ATRIAL FIBRILLATION ABLATION N/A 03/09/2018   Procedure: ATRIAL FIBRILLATION ABLATION;  Surgeon: [name], MD;  Location: [place] [facility];  Service: Cardiovascular;  Laterality: N/A;  . CARDIOVERSION N/A 07/04/2017   Procedure: CARDIOVERSION;  Surgeon: [name],  MD;  Location: [facility] ENDOSCOPY;  Service: Cardiovascular;  Laterality: N/A;  . CARDIOVERSION N/A 09/06/2017   Procedure: CARDIOVERSION;  Surgeon: [name], MD;  Location: [facility] ENDOSCOPY;  Service: Cardiovascular;  Laterality: N/A;  . CHOLECYSTECTOMY N/A 02/24/2015   Procedure: LAPAROSCOPIC CHOLECYSTECTOMY;  Surgeon: [name], MD;  Location: [place];  Service: General;  Laterality: N/A;  . EXCISION HAGLUND'S DEFORMITY WITH ACHILLES TENDON REPAIR Left 12/21/2018   Procedure: Left Achilles Tendon Debridement and Reconstruction; Excision of Haglund Deformity;  Surgeon: [name], MD;  Location: [place];  Service: Orthopedics;  Laterality: Left;  [name] GASTROCNEMIUS RECESSION Left 12/21/2018   Procedure: Left Gastroc Recession;  Surgeon: [name], MD;  Location: [place];  Service: Orthopedics;  Laterality: Left;  . RIGHT/LEFT HEART CATH AND CORONARY ANGIOGRAPHY N/A 08/09/2017   Procedure: RIGHT/LEFT HEART CATH AND CORONARY ANGIOGRAPHY;  Surgeon: [name], MD;  Location: [place] [facility];  Service: Cardiovascular;  Laterality: N/A;  . TEE WITHOUT CARDIOVERSION N/A 07/04/2017   Procedure: TRANSESOPHAGEAL ECHOCARDIOGRAM (TEE);  Surgeon: [name], MD;  Location: [facility] END[facility]COPY;  Service: Cardiovascular;  Laterality: N/A;  . TEE WITHOUT CARDIOVERSION N/A 03/09/2018   Procedure: TRANSESOPHAGEAL ECHOCARDIOGRAM (TEE);  Surgeon: [name], MD;  Location: [facility] ENDOSCOPY;  Service: Cardiovascular;  Laterality: N/A;    Current Outpatient Medications  Medication Sig Dispense Refill  . acetaminophen (TYLENOL) 325 MG tablet Take 325 mg by mouth as needed for moderate pain or  headache.     [name] apixaban (ELIQUIS) 5 MG TABS tablet Take 1 tablet (5 mg total) by mouth 2 (two) times daily. 180 tablet 3  . diltiazem (CARDIZEM CD) 120 MG 24 hr capsule Take 2 tablets in the AM and 1 tablet in the PM 270 capsule 2  . dofetilide (TIKOSYN) 125 MCG capsule Take 1 capsule (125  mcg total) by mouth 2 (two) times daily. 180 capsule 2  . furosemide (LASIX) 40 MG tablet Take 20 mg by mouth 2 (two) times daily.     . Magnesium Gluconate 500 (27 Mg) MG TABS Take 1 tablet (500 mg total) by mouth daily. 30 tablet 0  . metFORMIN (GLUCOPHAGE) 500 MG tablet Take 1 tablet (500 mg total) by mouth 2 (two) times daily with a meal. 180 tablet 0  . metoprolol succinate (TOPROL-XL) 50 MG 24 hr tablet Take 1 tablet (50 mg total) by mouth daily. Take with or immediately following a meal. 90 tablet 3  . Multiple Vitamins-Minerals (WOMENS MULTIVITAMIN) TABS Take 1 tablet by mouth daily. 90 tablet 0  . potassium chloride SA (KLOR-CON) 20 MEQ tablet Take 1 tablet (20 mEq total) by mouth daily. Take an extra 1 tablet by mouth on days when you take the extra fluid pill (furosemide/Lasix).    . Vitamin D, Ergocalciferol, (DRISDOL) 1.25 MG (50000 UNIT) CAPS capsule Take 1 every 14 days 2 capsule 0   No current facility-administered medications for this encounter.    Allergies  Allergen Reactions  . Hydrocodone-Acetaminophen Nausea And Vomiting  . Other Other (See Comments)    Metals Polyester - including hospital gowns and sheets - allergic contact dermatitis  . Sulfur Hives    Social History   Socioeconomic History  . Marital status: Married    Spouse name: Not on file  . Number of children: 4  . Years of education: Not on file  . Highest education level: Not on file  Occupational History  . Occupation: Disabled/retired  Tobacco Use  . Smoking status: Never Smoker  . Smokeless tobacco: Never Used  Vaping Use  . Vaping Use: Never used  Substance and Sexual Activity  . Alcohol use: Yes    Comment: little  . Drug use: No  . Sexual activity: Not Currently  Other Topics Concern  . Not on file  Social History Narrative   Lives in [place]   Not working   Social Determinants of Health   Financial Resource Strain:   . Difficulty of Paying Living Expenses:   Food  Insecurity:   . Worried About Charity fundraiser in the Last Year:   . Arboriculturist in the Last Year:   Transportation Needs:   . Film/video editor (Medical):   [name] Lack of Transportation (Non-Medical):   Physical Activity:   . Days of Exercise per Week:   . Minutes of Exercise per Session:   Stress:   . Feeling of Stress :   Social Connections:   . Frequency of Communication with Friends and Family:   . Frequency of Social Gatherings with Friends and Family:   . Attends Religious Services:   . Active Member of Clubs or Organizations:   . Attends Archivist Meetings:   [name] Marital Status:   Intimate Partner Violence:   . Fear of Current or Ex-Partner:   . Emotionally Abused:   [name] Physically Abused:   . Sexually Abused:     Family History  Problem Relation Age of  Onset  . Heart disease Mother   . Hypertension Mother   . Hyperlipidemia Mother   . Diabetes Mother   . Sudden death Mother   . Thyroid disease Mother   . Obesity Mother   . Heart disease Father   . Heart attack Father   . Hypertension Father   . Hyperlipidemia Father   . Diabetes Father     ROS- All systems are reviewed and negative except as per the HPI above  Physical Exam: There were no vitals filed for this visit. Wt Readings from Last 3 Encounters:  12/05/19 109.3 kg  11/07/19 108.4 kg  10/17/19 111.9 kg    Labs: Lab Results  Component Value Date   NA 140 11/07/2019   K 4.2 11/07/2019   CL 102 11/07/2019   CO2 21 11/07/2019   GLUCOSE 101 (H) 11/07/2019   BUN 17 11/07/2019   CREATININE 0.83 11/07/2019   CALCIUM 9.4 11/07/2019   MG 2.3 10/17/2019   Lab Results  Component Value Date   INR 1.07 08/09/2017   Lab Results  Component Value Date   CHOL 160 01/24/2019   HDL 31 (L) 01/24/2019   LDLCALC 94 01/24/2019   TRIG 174 (H) 01/24/2019    GEN- The patient is well appearing obese female, alert and oriented x 3 today.   HEENT-head normocephalic, atraumatic, sclera  clear, conjunctiva pink, hearing intact, trachea midline. Lungs- Clear to ausculation bilaterally, normal work of breathing Heart- Regular rate and rhythm, no murmurs, rubs or gallops  GI- soft, NT, ND, + BS Extremities- no clubbing, cyanosis, or edema MS- no significant deformity or atrophy Skin- no rash or lesion Psych- euthymic mood, full affect Neuro- strength and sensation are intact   EKG- SR with  HR of  70 , PR 180 ms, QRS 86, QTc 457  ms Echo- 12/2018-1. The left ventricle has normal systolic function with an ejection fraction of 60-65%. The cavity size was normal. Left ventricular diastolic parameters were normal.  2. The right ventricle has normal systolic function. The cavity was normal. There is no increase in right ventricular wall thickness.  3. No evidence of mitral valve stenosis.  4. No stenosis of the aortic valve.  5. The aortic root and ascending aorta are normal in size and structure.  6. The average left ventricular global longitudinal strain is -23.7 %.  7. The interatrial septum was not assessed. Epic records reviewed   Assessment and Plan:  1. Persistent Atrial fibrillation Doing well staying in SR s/p ablation 03/09/18  Continue  Tikosyn stable qtc. Continue diltiazem daily Continue Eliquis 5 mg BID for CHA2DS2VASc score of 3. Bmet/mag today  2. Systolic dysfunction EF 16-10% on TEE Echo done in July showed normalization of EF Was planning to repeat echo on this visit, but she is dong well, no further shortness of breath or swelling. She would like deferred to next visit to schedule   3. Obesity There is no height or weight on file to calculate BMI. Lifestyle modification was discussed and encouraged including regular physical activity and weight reduction.  4. OSA Now using oral appliance from Dr Ron Parker. Encouraged compliance.   Follow up with AF clinic in 4 months.  Geroge Baseman Terrell Shimko, [facility] [address] [place], [place] [address] [phone]

## 2020-02-01 ENCOUNTER — Other Ambulatory Visit: Payer: Self-pay

## 2020-02-01 ENCOUNTER — Ambulatory Visit (HOSPITAL_COMMUNITY)
Admission: RE | Admit: 2020-02-01 | Discharge: 2020-02-01 | Disposition: A | Discharge status: Home / Self Care | Payer: Medicare Other | Source: Ambulatory Visit | Attending: Nurse Practitioner | Admitting: Nurse Practitioner

## 2020-02-01 DIAGNOSIS — I4819 Other persistent atrial fibrillation: Secondary | ICD-10-CM | POA: Insufficient documentation

## 2020-02-01 LAB — BASIC METABOLIC PANEL
Anion gap: 8 (ref 5–15)
BUN: 10 mg/dL (ref 8–23)
CO2: 26 mmol/L (ref 22–32)
Calcium: 9.6 mg/dL (ref 8.9–10.3)
Chloride: 106 mmol/L (ref 98–111)
Creatinine, Ser: 0.81 mg/dL (ref 0.44–1.00)
GFR calc Af Amer: >60 mL/min (ref >60)
GFR calc non Af Amer: >60 mL/min (ref >60)
Glucose, Bld: 116 mg/dL — ABNORMAL HIGH (ref 70–99)
Potassium: 4.5 mmol/L (ref 3.5–5.1)
Sodium: 140 mmol/L (ref 135–145)

## 2020-02-08 DIAGNOSIS — I10 Essential (primary) hypertension: Secondary | ICD-10-CM | POA: Diagnosis not present

## 2020-02-08 DIAGNOSIS — Z6838 Body mass index (BMI) 38.0-38.9, adult: Secondary | ICD-10-CM | POA: Diagnosis not present

## 2020-02-08 DIAGNOSIS — R7303 Prediabetes: Secondary | ICD-10-CM | POA: Diagnosis not present

## 2020-02-08 DIAGNOSIS — I4891 Unspecified atrial fibrillation: Secondary | ICD-10-CM | POA: Diagnosis not present

## 2020-02-08 DIAGNOSIS — R635 Abnormal weight gain: Secondary | ICD-10-CM | POA: Diagnosis not present

## 2020-03-11 DIAGNOSIS — I10 Essential (primary) hypertension: Secondary | ICD-10-CM | POA: Diagnosis not present

## 2020-03-11 DIAGNOSIS — I4891 Unspecified atrial fibrillation: Secondary | ICD-10-CM | POA: Diagnosis not present

## 2020-03-11 DIAGNOSIS — Z6836 Body mass index (BMI) 36.0-36.9, adult: Secondary | ICD-10-CM | POA: Diagnosis not present

## 2020-03-11 DIAGNOSIS — R635 Abnormal weight gain: Secondary | ICD-10-CM | POA: Diagnosis not present

## 2020-03-11 DIAGNOSIS — R7303 Prediabetes: Secondary | ICD-10-CM | POA: Diagnosis not present

## 2020-04-04 ENCOUNTER — Other Ambulatory Visit (HOSPITAL_COMMUNITY): Payer: Self-pay | Admitting: *Deleted

## 2020-04-04 DIAGNOSIS — R0602 Shortness of breath: Secondary | ICD-10-CM

## 2020-04-04 MED ORDER — POTASSIUM CHLORIDE CRYS ER 20 MEQ PO TBCR: 20.0000 meq | EXTENDED_RELEASE_TABLET | Freq: Every day | ORAL | 2 refills | Status: DC

## 2020-04-14 DIAGNOSIS — I4891 Unspecified atrial fibrillation: Secondary | ICD-10-CM | POA: Diagnosis not present

## 2020-04-14 DIAGNOSIS — I10 Essential (primary) hypertension: Secondary | ICD-10-CM | POA: Diagnosis not present

## 2020-04-14 DIAGNOSIS — R635 Abnormal weight gain: Secondary | ICD-10-CM | POA: Diagnosis not present

## 2020-04-14 DIAGNOSIS — R7303 Prediabetes: Secondary | ICD-10-CM | POA: Diagnosis not present

## 2020-04-14 DIAGNOSIS — Z6835 Body mass index (BMI) 35.0-35.9, adult: Secondary | ICD-10-CM | POA: Diagnosis not present

## 2020-04-22 DIAGNOSIS — G8929 Other chronic pain: Secondary | ICD-10-CM

## 2020-04-22 HISTORY — DX: Other chronic pain: G89.29

## 2020-04-28 DIAGNOSIS — J849 Interstitial pulmonary disease, unspecified: Secondary | ICD-10-CM | POA: Diagnosis not present

## 2020-04-28 DIAGNOSIS — U071 COVID-19: Secondary | ICD-10-CM | POA: Diagnosis not present

## 2020-04-28 DIAGNOSIS — H6123 Impacted cerumen, bilateral: Secondary | ICD-10-CM | POA: Diagnosis not present

## 2020-04-28 DIAGNOSIS — J019 Acute sinusitis, unspecified: Secondary | ICD-10-CM | POA: Diagnosis not present

## 2020-04-28 DIAGNOSIS — R051 Acute cough: Secondary | ICD-10-CM | POA: Diagnosis not present

## 2020-04-28 DIAGNOSIS — J90 Pleural effusion, not elsewhere classified: Secondary | ICD-10-CM | POA: Diagnosis not present

## 2020-04-28 DIAGNOSIS — R059 Cough, unspecified: Secondary | ICD-10-CM | POA: Diagnosis not present

## 2020-04-28 DIAGNOSIS — Z20828 Contact with and (suspected) exposure to other viral communicable diseases: Secondary | ICD-10-CM | POA: Diagnosis not present

## 2020-04-28 DIAGNOSIS — J9811 Atelectasis: Secondary | ICD-10-CM | POA: Diagnosis not present

## 2020-05-13 ENCOUNTER — Ambulatory Visit (INDEPENDENT_AMBULATORY_CARE_PROVIDER_SITE_OTHER): Payer: Medicare Other | Admitting: Allergy

## 2020-05-13 ENCOUNTER — Other Ambulatory Visit: Payer: Self-pay

## 2020-05-13 ENCOUNTER — Encounter: Payer: Self-pay | Admitting: Allergy

## 2020-05-13 VITALS — BP 98/60 | HR 86 | Temp 97.7°F | Resp 16 | Ht 64.0 in | Wt 216.0 lb

## 2020-05-13 DIAGNOSIS — L230 Allergic contact dermatitis due to metals: Secondary | ICD-10-CM | POA: Diagnosis not present

## 2020-05-13 NOTE — Patient Instructions (Addendum)
Metal allergy - concern for metal allergy given contact rash following jewelry contact with skin - planning to have total knee replacement upcoming - metal series patch testing applied to back today.  Keep in place for 2 days.  Return to office on Thursday 05/15/20 for first reading and then Tuesday 05/20/20 for final reading.   Do not get patches wet once on back.  Can take antihistamine like Zyrtec, Xyzal, Allergra, Claritin or Benadryl if needed for itching.    Follow-up in 2 days

## 2020-05-13 NOTE — Progress Notes (Signed)
New Patient Note  RE: Christie Atkinson MRN: [id number] DOB: April 18, 1956 Date of Office Visit: 05/13/2020  Referring provider: Joelene Millin Primary care provider: Wallie Char, FNP  Chief Complaint: Metal allergy  History of present illness: Christie Atkinson is a 64 y.o. female presenting today for consultation for allergy to metal.  She states she is allergic to metals.  She has severe osteoarthritis and states getting around is becoming more difficult.  She has been recommended to have a total knee replacement and wants to know if she is allergic to metals that could be used in a joint replacement beforehand.  Her surgery is not yet scheduled.   She states with jewelry she breaks out in hives with contact typically after couple hours of wear.  She states she has to wear more expensive jewelry like gold and does report stainless steel is ok. Nickel she reports definitely causes a rash.  She has recovered from influenza she had several weeks ago.     Review of systems in past 4 week: Review of Systems  Constitutional: Negative.   HENT: Negative.   Eyes: Negative.   Respiratory: Positive for cough.   Cardiovascular: Negative.   Gastrointestinal: Negative.   Musculoskeletal: Negative.   Skin: Negative.   Neurological: Negative.     All other systems negative unless noted above in HPI  Past medical history: Past Medical History:  Diagnosis Date  . Asthma   . Atrial fibrillation ([place])   . Back pain   . Diastolic dysfunction   . Fibromyalgia   . Gestational diabetes   . Hypertension   . Knee pain   . Osteoarthritis   . Persistent atrial fibrillation ([place])   . Sleep apnea    wears oral appliance  . Visit for monitoring Tikosyn therapy 02/07/2018    Past surgical history: Past Surgical History:  Procedure Laterality Date  . ABDOMINAL HYSTERECTOMY    . ABLATION OF DYSRHYTHMIC FOCUS  03/09/2018  . APPENDECTOMY    . ATRIAL FIBRILLATION ABLATION N/A  03/09/2018   Procedure: ATRIAL FIBRILLATION ABLATION;  Surgeon: [name], MD;  Location: [place] [facility];  Service: Cardiovascular;  Laterality: N/A;  . CARDIOVERSION N/A 07/04/2017   Procedure: CARDIOVERSION;  Surgeon: [name], MD;  Location: [facility] ENDOSCOPY;  Service: Cardiovascular;  Laterality: N/A;  . CARDIOVERSION N/A 09/06/2017   Procedure: CARDIOVERSION;  Surgeon: [name], MD;  Location: [facility] ENDOSCOPY;  Service: Cardiovascular;  Laterality: N/A;  . CHOLECYSTECTOMY N/A 02/24/2015   Procedure: LAPAROSCOPIC CHOLECYSTECTOMY;  Surgeon: [name], MD;  Location: [place];  Service: General;  Laterality: N/A;  . EXCISION HAGLUND'S DEFORMITY WITH ACHILLES TENDON REPAIR Left 12/21/2018   Procedure: Left Achilles Tendon Debridement and Reconstruction; Excision of Haglund Deformity;  Surgeon: [name], MD;  Location: [place];  Service: Orthopedics;  Laterality: Left;  [name] GASTROCNEMIUS RECESSION Left 12/21/2018   Procedure: Left Gastroc Recession;  Surgeon: [name], MD;  Location: [place];  Service: Orthopedics;  Laterality: Left;  . RIGHT/LEFT HEART CATH AND CORONARY ANGIOGRAPHY N/A 08/09/2017   Procedure: RIGHT/LEFT HEART CATH AND CORONARY ANGIOGRAPHY;  Surgeon: [name], MD;  Location: [place] [facility];  Service: Cardiovascular;  Laterality: N/A;  . TEE WITHOUT CARDIOVERSION N/A 07/04/2017   Procedure: TRANSESOPHAGEAL ECHOCARDIOGRAM (TEE);  Surgeon: [name], MD;  Location: [facility] ENDOSCOPY;  Service: Cardiovascular;  Laterality: N/A;  . TEE WITHOUT CARDIOVERSION N/A 03/09/2018   Procedure: TRANSESOPHAGEAL ECHOCARDIOGRAM (TEE);  Surgeon: [name], MD;  Location: [facility] ENDOSCOPY;  Service: Cardiovascular;  Laterality: N/A;    Family history:  Family History  Problem Relation Age of Onset  . Heart disease Mother   . Hypertension Mother   . Hyperlipidemia Mother   . Diabetes Mother   . Sudden death Mother   . Thyroid disease  Mother   . Obesity Mother   . Heart disease Father   . Heart attack Father   . Hypertension Father   . Hyperlipidemia Father   . Diabetes Father   . Allergic rhinitis Son   . Eczema Son   . Allergic rhinitis Daughter   . Eczema Daughter   . Angioedema Neg Hx   . Asthma Neg Hx   . Immunodeficiency Neg Hx     Social history: Lives in a home without carpeting with electric heating and window within cooling.  Cat in the home.  There is no concern for water damage, mildew or roaches in the home.  She does not list an occupation at this time.  She does not list a smoking history.  Medication List: Current Outpatient Medications  Medication Sig Dispense Refill  . acetaminophen (TYLENOL) 325 MG tablet Take 325 mg by mouth as needed for moderate pain or headache.     [name] apixaban (ELIQUIS) 5 MG TABS tablet Take 1 tablet (5 mg total) by mouth 2 (two) times daily. 180 tablet 3  . diltiazem (CARDIZEM CD) 120 MG 24 hr capsule Take 2 tablets in the AM and 1 tablet in the PM 270 capsule 2  . diltiazem (TIAZAC) 120 MG 24 hr capsule TAKE TWO CAPSULES BY MOUTH EVERY MORNING AND TAKE ONE CAPSULE EVERY EVENING NEW INCREASED DOSE, [place] [facility] F[facility]    . dofetilide (TIKOSYN) 125 MCG capsule Take 1 capsule (125 mcg total) by mouth 2 (two) times daily. 180 capsule 2  . furosemide (LASIX) 40 MG tablet Take 20 mg by mouth 2 (two) times daily.     . Magnesium Gluconate 500 (27 Mg) MG TABS Take 1 tablet (500 mg total) by mouth daily. 30 tablet 0  . metFORMIN (GLUCOPHAGE) 500 MG tablet Take 1 tablet (500 mg total) by mouth 2 (two) times daily with a meal. 180 tablet 0  . metoprolol succinate (TOPROL-XL) 50 MG 24 hr tablet Take 1 tablet (50 mg total) by mouth daily. Take with or immediately following a meal. 90 tablet 3  . Multiple Vitamins-Minerals (WOMENS MULTIVITAMIN) TABS Take 1 tablet by mouth daily. 90 tablet 0  . potassium chloride SA (KLOR-CON) 20 MEQ tablet Take 1 tablet (20 mEq total) by mouth  daily. Take an extra 1 tablet by mouth on days when you take the extra fluid pill (furosemide/Lasix). 120 tablet 2   No current facility-administered medications for this visit.    Known medication allergies: Allergies  Allergen Reactions  . Hydrocodone-Acetaminophen Nausea And Vomiting  . Other Other (See Comments)    Metals Polyester - including hospital gowns and sheets - allergic contact dermatitis  . Sulfur Hives     Physical examination: Blood pressure 98/60, pulse 86, temperature 97.7 F (36.5 C), temperature source Tympanic, resp. rate 16, height 5\' 4"  (1.626 m), weight 216 lb (98 kg), SpO2 96 %.  General: Alert, interactive, in no acute distress. HEENT: PERRLA, TMs pearly gray, turbinates non-edematous without discharge, post-pharynx non erythematous. Neck: Supple without lymphadenopathy. Lungs: Clear to auscultation without wheezing, rhonchi or rales. {no increased work of breathing. CV: Normal S1,  S2 without murmurs. Abdomen: Nondistended, nontender. Skin: Warm and dry, without lesions or rashes. Extremities:  No clubbing, cyanosis or edema. Neuro:   Grossly intact.  Diagnositics/Labs: Metal series patch panels apply to the back and taking place  Assessment and plan: Contact dermatitis due to metals - concern for metal allergy given contact rash following jewelry contact with skin - planning to have total knee replacement upcoming - metal series patch testing applied to back today.  Keep in place for 2 days.  Return to office on Thursday 05/15/20 for first reading and then Tuesday 05/20/20 for final reading.   Do not get patches wet once on back.  Can take antihistamine like Zyrtec, Xyzal, Allergra, Claritin or Benadryl if needed for itching.    Follow-up in 2 days   I appreciate the opportunity to take part in Danicka's care. Please do not hesitate to contact me with questions.  Sincerely,   Prudy Feeler, MD Allergy/Immunology [facility]  [place]

## 2020-05-15 ENCOUNTER — Ambulatory Visit: Payer: Medicare Other | Admitting: Allergy and Immunology

## 2020-05-15 ENCOUNTER — Other Ambulatory Visit: Payer: Self-pay

## 2020-05-15 DIAGNOSIS — L230 Allergic contact dermatitis due to metals: Secondary | ICD-10-CM

## 2020-05-19 ENCOUNTER — Encounter: Payer: Self-pay | Admitting: Allergy and Immunology

## 2020-05-19 NOTE — Progress Notes (Signed)
Christie Atkinson returns to have a true test patch test read at 37-NDLO application mark.  She had +2 reactions to nickel.  She will follow up with Dr. Nelva Bush for a 7-day read.

## 2020-05-20 ENCOUNTER — Ambulatory Visit (INDEPENDENT_AMBULATORY_CARE_PROVIDER_SITE_OTHER): Payer: Medicare Other | Admitting: Allergy

## 2020-05-20 ENCOUNTER — Encounter: Payer: Self-pay | Admitting: Allergy

## 2020-05-20 ENCOUNTER — Other Ambulatory Visit: Payer: Self-pay

## 2020-05-20 ENCOUNTER — Ambulatory Visit (HOSPITAL_COMMUNITY): Payer: Medicare Other | Admitting: Nurse Practitioner

## 2020-05-20 DIAGNOSIS — L230 Allergic contact dermatitis due to metals: Secondary | ICD-10-CM

## 2020-05-20 NOTE — Progress Notes (Signed)
    Follow-up Note  RE: Christie Atkinson MRN: [id number] DOB: 1955-12-24 Date of Office Visit: 05/20/2020  Primary care provider: Wallie Char, [place] Referring provider: Wallie Char, FNP   [name] returns to the office today for the final patch test interpretation, given suspected history of contact dermatitis.    Diagnostics:  Metal series 7-day reading: 2+ to nickel with an erythematous, edematous scaly square over nickel sulfate hexahydrate 5% placement  Negative to chromium chloride 1%, potassium dichromate 0.25%, cobalt chloride hexahydrate 1%, copper sulfate pentahydrate 2%, molybdenum chloride 0.5%, titanium 0.1%, tantal 1%, magnesium chloride 0.5%, aluminum hydroxide 10%, vanadium pentoxide 10%  Plan:  Allergic contact dermatitis  The patient has been provided detailed information regarding the substances she is sensitive to, as well as products containing the substances.  Meticulous avoidance of these substances is recommended. If avoidance is not possible, the use of barrier creams or lotions is recommended.  Advise she not have any nickel-containing joint placed  Advised patient to let us know who her surgeon asked that we can send them this information  We will send this to her PCP at the [place]

## 2020-05-21 DIAGNOSIS — R635 Abnormal weight gain: Secondary | ICD-10-CM | POA: Diagnosis not present

## 2020-05-21 DIAGNOSIS — R7303 Prediabetes: Secondary | ICD-10-CM | POA: Diagnosis not present

## 2020-05-21 DIAGNOSIS — J301 Allergic rhinitis due to pollen: Secondary | ICD-10-CM | POA: Diagnosis not present

## 2020-05-21 DIAGNOSIS — Z6835 Body mass index (BMI) 35.0-35.9, adult: Secondary | ICD-10-CM | POA: Diagnosis not present

## 2020-05-21 DIAGNOSIS — I10 Essential (primary) hypertension: Secondary | ICD-10-CM | POA: Diagnosis not present

## 2020-05-21 DIAGNOSIS — J4540 Moderate persistent asthma, uncomplicated: Secondary | ICD-10-CM | POA: Diagnosis not present

## 2020-05-21 DIAGNOSIS — R5383 Other fatigue: Secondary | ICD-10-CM | POA: Diagnosis not present

## 2020-05-21 DIAGNOSIS — R918 Other nonspecific abnormal finding of lung field: Secondary | ICD-10-CM | POA: Diagnosis not present

## 2020-05-26 ENCOUNTER — Ambulatory Visit (HOSPITAL_COMMUNITY): Payer: Medicare Other | Admitting: Nurse Practitioner

## 2020-05-29 ENCOUNTER — Encounter: Payer: Self-pay | Admitting: *Deleted

## 2020-06-02 ENCOUNTER — Other Ambulatory Visit: Payer: Self-pay

## 2020-06-02 ENCOUNTER — Ambulatory Visit (HOSPITAL_COMMUNITY)
Admission: RE | Admit: 2020-06-02 | Discharge: 2020-06-02 | Disposition: A | Discharge status: Home / Self Care | Payer: Medicare Other | Source: Ambulatory Visit | Attending: Nurse Practitioner | Admitting: Nurse Practitioner

## 2020-06-02 ENCOUNTER — Encounter (HOSPITAL_COMMUNITY): Payer: Self-pay | Admitting: Nurse Practitioner

## 2020-06-02 VITALS — BP 120/70 | HR 72 | Ht 64.0 in | Wt 214.6 lb

## 2020-06-02 DIAGNOSIS — Z79899 Other long term (current) drug therapy: Secondary | ICD-10-CM | POA: Insufficient documentation

## 2020-06-02 DIAGNOSIS — D6869 Other thrombophilia: Secondary | ICD-10-CM

## 2020-06-02 DIAGNOSIS — Z885 Allergy status to narcotic agent status: Secondary | ICD-10-CM | POA: Insufficient documentation

## 2020-06-02 DIAGNOSIS — Z7984 Long term (current) use of oral hypoglycemic drugs: Secondary | ICD-10-CM | POA: Diagnosis not present

## 2020-06-02 DIAGNOSIS — E669 Obesity, unspecified: Secondary | ICD-10-CM | POA: Diagnosis not present

## 2020-06-02 DIAGNOSIS — G4733 Obstructive sleep apnea (adult) (pediatric): Secondary | ICD-10-CM | POA: Diagnosis not present

## 2020-06-02 DIAGNOSIS — Z888 Allergy status to other drugs, medicaments and biological substances status: Secondary | ICD-10-CM | POA: Diagnosis not present

## 2020-06-02 DIAGNOSIS — Z6836 Body mass index (BMI) 36.0-36.9, adult: Secondary | ICD-10-CM | POA: Diagnosis not present

## 2020-06-02 DIAGNOSIS — Z7901 Long term (current) use of anticoagulants: Secondary | ICD-10-CM | POA: Insufficient documentation

## 2020-06-02 DIAGNOSIS — I5020 Unspecified systolic (congestive) heart failure: Secondary | ICD-10-CM | POA: Diagnosis not present

## 2020-06-02 DIAGNOSIS — I4819 Other persistent atrial fibrillation: Secondary | ICD-10-CM | POA: Insufficient documentation

## 2020-06-02 LAB — BASIC METABOLIC PANEL
Anion gap: 8 (ref 5–15)
BUN: 13 mg/dL (ref 8–23)
CO2: 25 mmol/L (ref 22–32)
Calcium: 9.4 mg/dL (ref 8.9–10.3)
Chloride: 105 mmol/L (ref 98–111)
Creatinine, Ser: 0.84 mg/dL (ref 0.44–1.00)
GFR, Estimated: >60 mL/min (ref >60)
Glucose, Bld: 97 mg/dL (ref 70–99)
Potassium: 4.5 mmol/L (ref 3.5–5.1)
Sodium: 138 mmol/L (ref 135–145)

## 2020-06-02 LAB — MAGNESIUM: Magnesium: 2.3 mg/dL (ref 1.7–2.4)

## 2020-06-02 MED ORDER — METOPROLOL SUCCINATE ER 50 MG PO TB24: 50.0000 mg | ORAL_TABLET | ORAL | Status: DC | PRN

## 2020-06-02 MED ORDER — DOFETILIDE 125 MCG PO CAPS: 125.0000 ug | ORAL_CAPSULE | Freq: Two times a day (BID) | ORAL | 1 refills | Status: DC

## 2020-06-02 NOTE — Addendum Note (Signed)
Encounter addended by: Learta Codding, CMA on: 06/02/2020 4:43 PM  Actions taken: Order list changed

## 2020-06-02 NOTE — Progress Notes (Signed)
Primary Care Physician: Wallie Char, FNP Referring Physician:Dr. [name] is a 64 y.o. female with a h/o persistent afib that was having  breakthrough afib with Tikosyn and had an afib ablation 03/09/18 with [name]. She also had foot surgery last summer and had more paroxysmal afib during that time.  The afib has calmed down now that her foot surgery stress has resolved. She remains  on eliquis 5 mg bid with a CHA2DS2VASc score of 3( HF, female, pre -diabetes).   F/u in afib clinic, 01/17/20. She reports that she has done very well since being back in [place] for a couple of months. She spent the winter in [place] and had some  swelling  of LLE's and shortness of breath there.  This has resolved since returning home. Unfortunately, her husband passed from multiple myeloma one mont ago.  She continues on dofetilide 125 mcg bid.   F/u in the afib clinic, 06/02/20. She has been staying in [place]. She  had the flu about a month ago. Got thru that without any afib. Will repeat the echo. Has not noted any afib at all. She  continues on tikosyn 125 mcg bid.   Today, she denies symptoms of palpitations, chest pain, shortness of breath, orthopnea, PND, lower extremity edema, dizziness, presyncope, syncope, or neurologic sequela. The patient is tolerating medications without difficulties and is otherwise without complaint today.   Past Medical History:  Diagnosis Date  . Asthma   . Atrial fibrillation ([place])   . Back pain   . Diastolic dysfunction   . Fibromyalgia   . Gestational diabetes   . Hypertension   . Knee pain   . Osteoarthritis   . Persistent atrial fibrillation ([place])   . Sleep apnea    wears oral appliance  . Visit for monitoring Tikosyn therapy 02/07/2018   Past Surgical History:  Procedure Laterality Date  . ABDOMINAL HYSTERECTOMY    . ABLATION OF DYSRHYTHMIC FOCUS  03/09/2018  . APPENDECTOMY    . ATRIAL FIBRILLATION ABLATION N/A 03/09/2018    Procedure: ATRIAL FIBRILLATION ABLATION;  Surgeon: [name], MD;  Location: [place] [facility];  Service: Cardiovascular;  Laterality: N/A;  . CARDIOVERSION N/A 07/04/2017   Procedure: CARDIOVERSION;  Surgeon: [name], MD;  Location: [facility] ENDOSCOPY;  Service: Cardiovascular;  Laterality: N/A;  . CARDIOVERSION N/A 09/06/2017   Procedure: CARDIOVERSION;  Surgeon: [name], MD;  Location: [facility] ENDOSCOPY;  Service: Cardiovascular;  Laterality: N/A;  . CHOLECYSTECTOMY N/A 02/24/2015   Procedure: LAPAROSCOPIC CHOLECYSTECTOMY;  Surgeon: [name], MD;  Location: [place];  Service: General;  Laterality: N/A;  . EXCISION HAGLUND'S DEFORMITY WITH ACHILLES TENDON REPAIR Left 12/21/2018   Procedure: Left Achilles Tendon Debridement and Reconstruction; Excision of Haglund Deformity;  Surgeon: [name], MD;  Location: [place];  Service: Orthopedics;  Laterality: Left;  [name] GASTROCNEMIUS RECESSION Left 12/21/2018   Procedure: Left Gastroc Recession;  Surgeon: [name], MD;  Location: [place];  Service: Orthopedics;  Laterality: Left;  . RIGHT/LEFT HEART CATH AND CORONARY ANGIOGRAPHY N/A 08/09/2017   Procedure: RIGHT/LEFT HEART CATH AND CORONARY ANGIOGRAPHY;  Surgeon: [name], MD;  Location: [place] [facility];  Service: Cardiovascular;  Laterality: N/A;  . TEE WITHOUT CARDIOVERSION N/A 07/04/2017   Procedure: TRANSESOPHAGEAL ECHOCARDIOGRAM (TEE);  Surgeon: [name], MD;  Location: [facility] ENDOSCOPY;  Service: Cardiovascular;  Laterality: N/A;  . TEE WITHOUT CARDIOVERSION N/A 03/09/2018   Procedure:  TRANSESOPHAGEAL ECHOCARDIOGRAM (TEE);  Surgeon: [name], MD;  Location: [facility] ENDOSCOPY;  Service: Cardiovascular;  Laterality: N/A;    Current Outpatient Medications  Medication Sig Dispense Refill  . acetaminophen (TYLENOL) 325 MG tablet Take 325 mg by mouth as needed for moderate pain or headache.     . albuterol (VENTOLIN HFA) 108 (90 Base) MCG/ACT  inhaler Inhale 1 puff into the lungs as needed for wheezing or shortness of breath.    [name] apixaban (ELIQUIS) 5 MG TABS tablet Take 1 tablet (5 mg total) by mouth 2 (two) times daily. 180 tablet 3  . diltiazem (CARDIZEM CD) 120 MG 24 hr capsule Take 2 tablets in the AM and 1 tablet in the PM 270 capsule 2  . dofetilide (TIKOSYN) 125 MCG capsule Take 1 capsule (125 mcg total) by mouth 2 (two) times daily. 180 capsule 2  . Fluticasone-Salmeterol (WIXELA INHUB) 100-50 MCG/DOSE AEPB Inhale 1 puff into the lungs daily in the afternoon.    . furosemide (LASIX) 40 MG tablet Take 20 mg by mouth 2 (two) times daily.     [name] liraglutide (VICTOZA) 18 MG/3ML SOPN Inject 1.8 mg into the skin daily.    . Magnesium Gluconate 500 (27 Mg) MG TABS Take 1 tablet (500 mg total) by mouth daily. 30 tablet 0  . metFORMIN (GLUCOPHAGE) 500 MG tablet Take 1 tablet (500 mg total) by mouth 2 (two) times daily with a meal. (Patient taking differently: Take 500 mg by mouth 2 (two) times daily with a meal. Sometimes takes 1-2 tablets depends on how much she is eating) 180 tablet 0  . metoprolol succinate (TOPROL-XL) 50 MG 24 hr tablet Take 1 tablet (50 mg total) by mouth daily. Take with or immediately following a meal. 90 tablet 3  . Multiple Vitamins-Minerals (WOMENS MULTIVITAMIN) TABS Take 1 tablet by mouth daily. 90 tablet 0  . potassium chloride SA (KLOR-CON) 20 MEQ tablet Take 1 tablet (20 mEq total) by mouth daily. Take an extra 1 tablet by mouth on days when you take the extra fluid pill (furosemide/Lasix). 120 tablet 2   No current facility-administered medications for this encounter.    Allergies  Allergen Reactions  . Hydrocodone-Acetaminophen Nausea And Vomiting  . Other Other (See Comments)    Metals Polyester - including hospital gowns and sheets - allergic contact dermatitis  . Sulfur Hives    Social History   Socioeconomic History  . Marital status: Married    Spouse name: Not on file  . Number of  children: 4  . Years of education: Not on file  . Highest education level: Not on file  Occupational History  . Occupation: Disabled/retired  Tobacco Use  . Smoking status: Passive Smoke Exposure - Never Smoker  . Smokeless tobacco: Never Used  Vaping Use  . Vaping Use: Never used  Substance and Sexual Activity  . Alcohol use: Yes    Comment: little  . Drug use: No  . Sexual activity: Not Currently  Other Topics Concern  . Not on file  Social History Narrative   Lives in [place]   Not working   Social Determinants of Health   Financial Resource Strain: Not on file  Food Insecurity: Not on file  Transportation Needs: Not on file  Physical Activity: Not on file  Stress: Not on file  Social Connections: Not on file  Intimate Partner Violence: Not on file    Family History  Problem Relation Age of Onset  . Heart disease Mother   .  Hypertension Mother   . Hyperlipidemia Mother   . Diabetes Mother   . Sudden death Mother   . Thyroid disease Mother   . Obesity Mother   . Heart disease Father   . Heart attack Father   . Hypertension Father   . Hyperlipidemia Father   . Diabetes Father   . Allergic rhinitis Son   . Eczema Son   . Allergic rhinitis Daughter   . Eczema Daughter   . Angioedema Neg Hx   . Asthma Neg Hx   . Immunodeficiency Neg Hx     ROS- All systems are reviewed and negative except as per the HPI above  Physical Exam: Vitals:   06/02/20 0955  Weight: 97.3 kg  Height: _0  (1.626 m)   Wt Readings from Last 3 Encounters:  06/02/20 97.3 kg  05/13/20 98 kg  01/17/20 106.2 kg    Labs: Lab Results  Component Value Date   NA 140 02/01/2020   K 4.5 02/01/2020   CL 106 02/01/2020   CO2 26 02/01/2020   GLUCOSE 116 (H) 02/01/2020   BUN 10 02/01/2020   CREATININE 0.81 02/01/2020   CALCIUM 9.6 02/01/2020   MG 2.1 01/17/2020   Lab Results  Component Value Date   INR 1.07 08/09/2017   Lab Results  Component Value Date   CHOL 160  01/24/2019   HDL 31 (L) 01/24/2019   LDLCALC 94 01/24/2019   TRIG 174 (H) 01/24/2019    GEN- The patient is well appearing obese female, alert and oriented x 3 today.   HEENT-head normocephalic, atraumatic, sclera clear, conjunctiva pink, hearing intact, trachea midline. Lungs- Clear to ausculation bilaterally, normal work of breathing Heart- Regular rate and rhythm, no murmurs, rubs or gallops  GI- soft, NT, ND, + BS Extremities- no clubbing, cyanosis, or edema MS- no significant deformity or atrophy Skin- no rash or lesion Psych- euthymic mood, full affect Neuro- strength and sensation are intact   EKG- SR with  HR of  72,  PR 170 ms, QRS 82, QTc 455  ms Echo- 12/2018-1. The left ventricle has normal systolic function with an ejection fraction of 60-65%. The cavity size was normal. Left ventricular diastolic parameters were normal.  2. The right ventricle has normal systolic function. The cavity was normal. There is no increase in right ventricular wall thickness.  3. No evidence of mitral valve stenosis.  4. No stenosis of the aortic valve.  5. The aortic root and ascending aorta are normal in size and structure.  6. The average left ventricular global longitudinal strain is -23.7 %.  7. The interatrial septum was not assessed. Epic records reviewed   Assessment and Plan:  1. Persistent Atrial fibrillation Doing well staying in [place] s/p ablation 03/09/18  Continue  Tikosyn stable qtc. Continue diltiazem daily Continue Eliquis 5 mg BID for CHA2DS2VASc score of 3. Bmet/mag today  2. Systolic dysfunction EF 22-97% on TEE Echo done in July showed normalization of EF Will update echo   3. Obesity Body mass index is 36.84 kg/m. Lifestyle modification was discussed and encouraged including regular physical activity and weight reduction.  4. OSA Now using oral appliance from Dr Ron Parker. Encouraged compliance.   Follow up with AF clinic in 6 months. Will call results of echo  when available   Butch Penny C. Nikita Surman, [facility] [address] [place], [place] [address] [phone]

## 2020-06-05 ENCOUNTER — Ambulatory Visit (HOSPITAL_COMMUNITY): Payer: Medicare Other

## 2020-06-11 ENCOUNTER — Other Ambulatory Visit: Payer: Self-pay

## 2020-06-11 ENCOUNTER — Ambulatory Visit (HOSPITAL_COMMUNITY)
Admission: RE | Admit: 2020-06-11 | Discharge: 2020-06-11 | Disposition: A | Discharge status: Home / Self Care | Payer: Medicare Other | Source: Ambulatory Visit | Attending: Nurse Practitioner | Admitting: Nurse Practitioner

## 2020-06-11 DIAGNOSIS — I4819 Other persistent atrial fibrillation: Secondary | ICD-10-CM | POA: Diagnosis not present

## 2020-06-11 DIAGNOSIS — I4891 Unspecified atrial fibrillation: Secondary | ICD-10-CM | POA: Insufficient documentation

## 2020-06-11 DIAGNOSIS — I509 Heart failure, unspecified: Secondary | ICD-10-CM | POA: Diagnosis not present

## 2020-06-11 LAB — ECHOCARDIOGRAM COMPLETE
Area-P 1/2: 2.73 cm2
S' Lateral: 3.2 cm

## 2020-06-11 NOTE — Progress Notes (Signed)
  Echocardiogram 2D Echocardiogram has been performed.  Christie Atkinson 06/11/2020, 11:57 AM

## 2020-06-12 ENCOUNTER — Encounter (HOSPITAL_COMMUNITY): Payer: Self-pay | Admitting: *Deleted

## 2020-06-12 DIAGNOSIS — R918 Other nonspecific abnormal finding of lung field: Secondary | ICD-10-CM | POA: Diagnosis not present

## 2020-06-19 ENCOUNTER — Emergency Department (HOSPITAL_COMMUNITY)
Admission: EM | Admit: 2020-06-19 | Discharge: 2020-06-19 | Disposition: A | Discharge status: Home / Self Care | Payer: No Typology Code available for payment source | Attending: Emergency Medicine | Admitting: Emergency Medicine

## 2020-06-19 ENCOUNTER — Telehealth: Payer: Self-pay | Admitting: Surgery

## 2020-06-19 ENCOUNTER — Encounter (HOSPITAL_COMMUNITY): Payer: Self-pay | Admitting: Emergency Medicine

## 2020-06-19 DIAGNOSIS — I110 Hypertensive heart disease with heart failure: Secondary | ICD-10-CM | POA: Diagnosis not present

## 2020-06-19 DIAGNOSIS — G8929 Other chronic pain: Secondary | ICD-10-CM | POA: Diagnosis not present

## 2020-06-19 DIAGNOSIS — Z7901 Long term (current) use of anticoagulants: Secondary | ICD-10-CM | POA: Insufficient documentation

## 2020-06-19 DIAGNOSIS — M549 Dorsalgia, unspecified: Secondary | ICD-10-CM | POA: Diagnosis not present

## 2020-06-19 DIAGNOSIS — R52 Pain, unspecified: Secondary | ICD-10-CM | POA: Diagnosis not present

## 2020-06-19 DIAGNOSIS — Z79899 Other long term (current) drug therapy: Secondary | ICD-10-CM | POA: Insufficient documentation

## 2020-06-19 DIAGNOSIS — Z7722 Contact with and (suspected) exposure to environmental tobacco smoke (acute) (chronic): Secondary | ICD-10-CM | POA: Diagnosis not present

## 2020-06-19 DIAGNOSIS — I4819 Other persistent atrial fibrillation: Secondary | ICD-10-CM | POA: Diagnosis not present

## 2020-06-19 DIAGNOSIS — U071 COVID-19: Secondary | ICD-10-CM | POA: Diagnosis not present

## 2020-06-19 DIAGNOSIS — M545 Low back pain, unspecified: Secondary | ICD-10-CM | POA: Diagnosis not present

## 2020-06-19 DIAGNOSIS — J45909 Unspecified asthma, uncomplicated: Secondary | ICD-10-CM | POA: Diagnosis not present

## 2020-06-19 DIAGNOSIS — R519 Headache, unspecified: Secondary | ICD-10-CM | POA: Diagnosis present

## 2020-06-19 DIAGNOSIS — I5032 Chronic diastolic (congestive) heart failure: Secondary | ICD-10-CM | POA: Insufficient documentation

## 2020-06-19 DIAGNOSIS — Z7951 Long term (current) use of inhaled steroids: Secondary | ICD-10-CM | POA: Diagnosis not present

## 2020-06-19 LAB — URINALYSIS, ROUTINE W REFLEX MICROSCOPIC
Bilirubin Urine: NEGATIVE
Glucose, UA: NEGATIVE mg/dL
Hgb urine dipstick: NEGATIVE
Ketones, ur: NEGATIVE mg/dL
Leukocytes,Ua: NEGATIVE
Nitrite: NEGATIVE
Protein, ur: NEGATIVE mg/dL
Specific Gravity, Urine: 1.004 — ABNORMAL LOW (ref 1.005–1.030)
pH: 6 (ref 5.0–8.0)

## 2020-06-19 LAB — RESP PANEL BY RT-PCR (FLU A&B, COVID) ARPGX2
Influenza A by PCR: NEGATIVE
Influenza B by PCR: NEGATIVE
SARS Coronavirus 2 by RT PCR: POSITIVE — AB

## 2020-06-19 MED ORDER — ACETAMINOPHEN 325 MG PO TABS
650.0000 mg | ORAL_TABLET | Freq: Once | ORAL | Status: AC | PRN
  Administered 2020-06-19: 650 mg via ORAL
  Filled 2020-06-19: qty 2

## 2020-06-19 MED ORDER — HYDROCODONE-ACETAMINOPHEN 5-325 MG PO TABS
1.0000 | ORAL_TABLET | Freq: Once | ORAL | Status: AC
  Administered 2020-06-19: 1 via ORAL
  Filled 2020-06-19: qty 1

## 2020-06-19 NOTE — ED Provider Notes (Signed)
[facility] EMERGENCY DEPARTMENT Provider Note   CSN: [id number] Arrival date & time: 06/19/20  [id number]     History Chief Complaint  Patient presents with  . Back Pain    Christie Atkinson is a 65 y.o. female.  Patient presents with neck and. Lower back pain that has been present for 2 days. Patient states that she feels this pain is different than her normal back pain associated with her fibromyalgia. She reports feeling feverish with chills for the last few days. Patient denies  vaccination for COVID-19. She reports some headache, myalgias,an episode of chest discomfort as well as decreased urinary frequency. She denies any shortness of breath. Patient states that she has a guest in the home and has been interacting with her daughter's family who moved to [place] recently. She is on Eliquis and Tikosyn for her AF. She reports  Taking metformin and Victoza for preDM. She has also been experiencing GERD symptoms without emesis. Patient adds that she has been using a Nitro chair to move around the home after having surgery on her LLE a while back. She reports not feeling as balanced as she usually does. She denies feeling confused. Denies report from family members of her appearing to have facial droop or slurred speech. Patient weakness in her legs as well as bladder or bowel incontinence.         Past Medical History:  Diagnosis Date  . Asthma   . Atrial fibrillation ([place])   . Back pain   . Diastolic dysfunction   . Fibromyalgia   . Gestational diabetes   . Hypertension   . Knee pain   . Osteoarthritis   . Persistent atrial fibrillation ([place])   . Sleep apnea    wears oral appliance  . Visit for monitoring Tikosyn therapy 02/07/2018    Patient Active Problem List   Diagnosis Date Noted  . Persistent atrial fibrillation ([place]) 03/09/2018  . Chronic diastolic CHF (congestive heart failure) ([place]) 02/11/2018  . Visit for monitoring Tikosyn therapy 02/07/2018   . Abnormal nuclear stress test 08/08/2017  . Other persistent atrial fibrillation ([place])   . Morbid obesity ([place]) 06/29/2017  . Asthma 06/29/2017  . Fibromyalgia 06/29/2017  . Congestive heart failure ([place])   . Preoperative cardiovascular examination 02/23/2015  . Atrial fibrillation with RVR ([place]) 02/23/2015  . Elevated blood pressure reading without diagnosis of hypertension 02/23/2015  . Abdominal pain   . Calculus of gallbladder with biliary obstruction but without cholecystitis   . Symptomatic cholelithiasis   . Cholelithiasis 02/22/2015    Past Surgical History:  Procedure Laterality Date  . ABDOMINAL HYSTERECTOMY    . ABLATION OF DYSRHYTHMIC FOCUS  03/09/2018  . APPENDECTOMY    . ATRIAL FIBRILLATION ABLATION N/A 03/09/2018   Procedure: ATRIAL FIBRILLATION ABLATION;  Surgeon: [name], MD;  Location: [place] [facility];  Service: Cardiovascular;  Laterality: N/A;  . CARDIOVERSION N/A 07/04/2017   Procedure: CARDIOVERSION;  Surgeon: [name], MD;  Location: [facility] ENDOSCOPY;  Service: Cardiovascular;  Laterality: N/A;  . CARDIOVERSION N/A 09/06/2017   Procedure: CARDIOVERSION;  Surgeon: [name], MD;  Location[facility] ENDOSCOPY;  Service: Cardiovascular;  Laterality: N/A;  . CHOLECYSTECTOMY N/A 02/24/2015   Procedure: LAPAROSCOPIC CHOLECYSTECTOMY;  Surgeon: [name], MD;  Location: [place];  Service: General;  Laterality: N/A;  . EXCISION HAGLUND'S DEFORMITY WITH ACHILLES TENDON REPAIR Left 12/21/2018   Procedure: Left Achilles Tendon Debridement and Reconstruction; Excision of Haglund Deformity;  Surgeon: Doran Durand,  Jenny Reichmann, MD;  Location: [place];  Service: Orthopedics;  Laterality: Left;  [name] GASTROCNEMIUS RECESSION Left 12/21/2018   Procedure: Left Gastroc Recession;  Surgeon: [name], MD;  Location: [place];  Service: Orthopedics;  Laterality: Left;  . RIGHT/LEFT HEART CATH AND CORONARY ANGIOGRAPHY N/A 08/09/2017   Procedure: RIGHT/LEFT  HEART CATH AND CORONARY ANGIOGRAPHY;  Surgeon: [name], MD;  Location: [place] [facility];  Service: Cardiovascular;  Laterality: N/A;  . TEE WITHOUT CARDIOVERSION N/A 07/04/2017   Procedure: TRANSESOPHAGEAL ECHOCARDIOGRAM (TEE);  Surgeon: [name], MD;  Location: [facility] ENDOSCOPY;  Service: Cardiovascular;  Laterality: N/A;  . TEE WITHOUT CARDIOVERSION N/A 03/09/2018   Procedure: TRANSESOPHAGEAL ECHOCARDIOGRAM (TEE);  Surgeon: [name], MD;  Location: [facility] ENDOSCOPY;  Service: Cardiovascular;  Laterality: N/A;     OB History    Gravida  4   Para  4   Term      Preterm      AB      Living  4     SAB      IAB      Ectopic      Multiple      Live Births              Family History  Problem Relation Age of Onset  . Heart disease Mother   . Hypertension Mother   . Hyperlipidemia Mother   . Diabetes Mother   . Sudden death Mother   . Thyroid disease Mother   . Obesity Mother   . Heart disease Father   . Heart attack Father   . Hypertension Father   . Hyperlipidemia Father   . Diabetes Father   . Allergic rhinitis Son   . Eczema Son   . Allergic rhinitis Daughter   . Eczema Daughter   . Angioedema Neg Hx   . Asthma Neg Hx   . Immunodeficiency Neg Hx     Social History   Tobacco Use  . Smoking status: Passive Smoke Exposure - Never Smoker  . Smokeless tobacco: Never Used  Vaping Use  . Vaping Use: Never used  Substance Use Topics  . Alcohol use: Yes    Comment: little  . Drug use: No    Home Medications Prior to Admission medications   Medication Sig Start Date End Date Taking? Authorizing Provider  acetaminophen (TYLENOL) 325 MG tablet Take 325 mg by mouth as needed for moderate pain or headache.     [provider]  albuterol (VENTOLIN HFA) 108 (90 Base) MCG/ACT inhaler Inhale 1 puff into the lungs as needed for wheezing or shortness of breath.    [provider]  apixaban (ELIQUIS) 5 MG TABS tablet Take 1 tablet  (5 mg total) by mouth 2 (two) times daily. 10/19/19   Sherran Needs, NP  diltiazem (CARDIZEM CD) 120 MG 24 hr capsule Take 2 tablets in the AM and 1 tablet in the PM 10/19/19   Sherran Needs, NP  dofetilide (TIKOSYN) 125 MCG capsule Take 1 capsule (125 mcg total) by mouth 2 (two) times daily. 06/02/20   Sherran Needs, NP  Fluticasone-Salmeterol [facility] INHUB) 100-50 MCG/DOSE AEPB Inhale 1 puff into the lungs daily in the afternoon.    [provider]  furosemide (LASIX) 40 MG tablet Take 20 mg by mouth 2 (two) times daily.     [provider]  liraglutide (VICTOZA) 18 MG/3ML SOPN Inject 1.8 mg into the skin daily.  [provider]  Magnesium Gluconate 500 (27 Mg) MG TABS Take 1 tablet (500 mg total) by mouth daily. 12/27/18   Laqueta Linden, MD  metFORMIN (GLUCOPHAGE) 500 MG tablet Take 1 tablet (500 mg total) by mouth 2 (two) times daily with a meal. Patient taking differently: Take 500 mg by mouth 2 (two) times daily with a meal. Sometimes takes 1-2 tablets depends on how much she is eating 12/05/19   Laqueta Linden, MD  metoprolol succinate (TOPROL-XL) 50 MG 24 hr tablet Take 1 tablet (50 mg total) by mouth as needed. Take with or immediately following a meal. 06/02/20   Sherran Needs, NP  Multiple Vitamins-Minerals (WOMENS MULTIVITAMIN) TABS Take 1 tablet by mouth daily. 06/21/18   Laqueta Linden, MD  potassium chloride SA (KLOR-CON) 20 MEQ tablet Take 1 tablet (20 mEq total) by mouth daily. Take an extra 1 tablet by mouth on days when you take the extra fluid pill (furosemide/Lasix). 04/04/20   Sherran Needs, NP    Allergies    Hydrocodone-acetaminophen, Other, and Sulfur  Review of Systems   Review of Systems  Constitutional: Positive for chills and fever.  Respiratory: Positive for cough.   Gastrointestinal: Negative for abdominal pain, diarrhea, nausea and vomiting.       Gastric reflex    Musculoskeletal: Positive for back  pain, myalgias and neck pain. Negative for neck stiffness.  Neurological: Positive for light-headedness and headaches. Negative for seizures.    Physical Exam Updated Vital Signs BP 137/68   Pulse 76   Temp 98.8 F (37.1 C) (Oral)   Resp 13   Ht 5\' 4"  (1.626 m)   Wt 97.1 kg   SpO2 100%   BMI 36.73 kg/m   Physical Exam Constitutional:      General: She is not in acute distress.    Appearance: She is obese. She is ill-appearing. She is not toxic-appearing.  HENT:     Nose: No congestion or rhinorrhea.     Mouth/Throat:     Mouth: Mucous membranes are dry.     Pharynx: Oropharynx is clear. No oropharyngeal exudate or posterior oropharyngeal erythema.  Eyes:     General: No scleral icterus.    Extraocular Movements: Extraocular movements intact.     Conjunctiva/sclera: Conjunctivae normal.     Pupils: Pupils are equal, round, and reactive to light.  Cardiovascular:     Rate and Rhythm: Normal rate and regular rhythm.     Heart sounds: Murmur heard.    Pulmonary:     Effort: Pulmonary effort is normal. No respiratory distress.     Breath sounds: Normal breath sounds. No wheezing or rales.     Comments: Coughs with taking deep breaths  Chest:     Chest wall: No tenderness.  Abdominal:     General: Bowel sounds are normal.     Palpations: Abdomen is soft.     Tenderness: There is no abdominal tenderness.  Musculoskeletal:        General: Tenderness present. No swelling, deformity or signs of injury. Normal range of motion.     Cervical back: Normal range of motion and neck supple. No rigidity.     Comments: Tenderness along paraspinal muscle and midline from cervical to lumbar region. No skin changes or edema/erythema noted on examination of back, demonstrates normal ROM of neck, no cervical LAD   Lymphadenopathy:     Cervical: No cervical adenopathy.  Neurological:     General: No focal  deficit present.     Mental Status: She is alert and oriented to person, place, and  time.     ED Results / Procedures / Treatments   Labs (all labs ordered are listed, but only abnormal results are displayed) Labs Reviewed  RESP PANEL BY RT-PCR (FLU A&B, COVID) ARPGX2 - Abnormal; Notable for the following components:      Result Value   SARS Coronavirus 2 by RT PCR POSITIVE (*)    All other components within normal limits  URINALYSIS, ROUTINE W REFLEX MICROSCOPIC - Abnormal; Notable for the following components:   Color, Urine STRAW (*)    Specific Gravity, Urine 1.004 (*)    All other components within normal limits  URINE CULTURE    EKG None  Radiology No results found.  Procedures Procedures (including critical care time)  Medications Ordered in ED Medications  acetaminophen (TYLENOL) tablet 650 mg (650 mg Oral Given 06/19/20 1007)  HYDROcodone-acetaminophen (NORCO/VICODIN) 5-325 MG per tablet 1 tablet (1 tablet Oral Given 06/19/20 1502)    ED Course  I have reviewed the triage vital signs and the nursing notes.  Pertinent labs & imaging results that were available during my care of the patient were reviewed by me and considered in my medical decision making (see chart for details).  Clinical Course as of 06/19/20 1523  Thu Jun 19, 2020  1513 U/A and urine culture collected.  [MS]    Clinical Course User Index [MS] Simmons-Robinson, Riki Sheer, MD   MDM Rules/Calculators/A&P                          AYLINE DINGUS is a 65 y.o. female presenting with back pain in setting of recent COVID + test.  Patient initially febrile upon presentation to the ED but resolved with Tylenol.  Patient's report of decreased urinary frequency in setting of back pain likely due to COVID symptoms and myalgia with some decreased PO intake however will check urine study for potential UTI or pyelonephritis. Patient breathing comfortably on RA with oxygen saturation of 98%. Patient with hx of AF and diastolic HF, obesity and prediabetes. Low suspicion for meningitis as patient  does not have neck stiffness and is not severely ill appearing. No neurological deficits noted on exam that would increase concern for nerve damage contributing to back pain. U/A had no signs of infection. Patient was treated with dose of Norco for back pain and determined to be safe for outpatient follow up with remdesivir referral.   Final Clinical Impression(s) / ED Diagnoses Final diagnoses:  COVID-19  Chronic bilateral low back pain without sciatica    Rx / DC Orders ED Discharge Orders         Ordered    Ambulatory referral for Covid Treatment        06/19/20 1456           Eulis Foster, MD 06/19/20 1523    Merryl Hacker, MD 06/19/20 1559

## 2020-06-19 NOTE — ED Triage Notes (Signed)
Patient BIB [facility] for lower and upper back and neck pain that started two days ago. History of chronic back pain. Denies recent injury, denies other complaints.

## 2020-06-19 NOTE — Telephone Encounter (Signed)
ED CM arranged safe transport home due to patient's Covid positive results. Patient was picked up from the ED waiting room. Waiver signed and placed in drawer for medical records.

## 2020-06-19 NOTE — Discharge Instructions (Addendum)
You have tested positive for COVID 19 and it is likely causing your back pain and other symptoms.   It is important that other members of your household be tested for COVID and that you all isolate from others outside of your home for a minimum of 5 days or until your symptoms have improved.   Please continue to wear a mask around your home and when around others after your symptoms have improved.   You should follow up with your PCP once improved.   You will be referred to outpatient remdesivir clinic given her hx of atrial fibrillation and pre-diabetes.   Please seek emergent care if you experience difficulty breathing, dizziness, or inability to drink fluids orally due to vomiting.

## 2020-06-20 ENCOUNTER — Telehealth: Payer: Self-pay | Admitting: Family

## 2020-06-20 ENCOUNTER — Encounter: Payer: Self-pay | Admitting: Family

## 2020-06-20 NOTE — Telephone Encounter (Signed)
I connected by phone with Christie Atkinson on 06/20/2020 at 8:40 AM to discuss the potential use of a new treatment for mild to moderate COVID-19 viral infection in non-hospitalized patients.  This patient is a 65 y.o. female that meets the FDA criteria for Emergency Use Authorization of COVID monoclonal antibody sotrovimab.  Has a (+) direct SARS-CoV-2 viral test result  Has mild or moderate COVID-19   Is NOT hospitalized due to COVID-19  Is within 10 days of symptom onset  Has at least one of the high risk factor(s) for progression to severe COVID-19 and/or hospitalization as defined in EUA.  Specific high risk criteria : BMI > 25, Diabetes and Cardiovascular disease or hypertension   I have spoken and communicated the following to the patient or parent/caregiver regarding COVID monoclonal antibody treatment:  1. FDA has authorized the emergency use for the treatment of mild to moderate COVID-19 in adults and pediatric patients with positive results of direct SARS-CoV-2 viral testing who are 7 years of age and older weighing at least 40 kg, and who are at high risk for progressing to severe COVID-19 and/or hospitalization.  2. The significant known and potential risks and benefits of COVID monoclonal antibody, and the extent to which such potential risks and benefits are unknown.  3. Information on available alternative treatments and the risks and benefits of those alternatives, including clinical trials.  4. Patients treated with COVID monoclonal antibody should continue to self-isolate and use infection control measures (e.g., wear mask, isolate, social distance, avoid sharing personal items, clean and disinfect "high touch" surfaces, and frequent handwashing) according to CDC guidelines.   5. The patient or parent/caregiver has the option to accept or refuse COVID monoclonal antibody treatment.  After reviewing this information with the patient, the patient wishes to think about it  and will contact us if she is interested.  Sx 06/17/20 Test 06/19/20 in [place], [place] 06/20/2020 8:40 AM

## 2020-06-22 LAB — URINE CULTURE: Culture: 60000 — AB

## 2020-06-23 NOTE — Progress Notes (Signed)
ED Antimicrobial Stewardship Positive Culture Follow Up   Christie Atkinson is an 65 y.o. female who presented to [facility] on 06/19/2020 with a chief complaint of  Chief Complaint  Patient presents with   Back Pain    Recent Results (from the past 720 hour(s))  Resp Panel by RT-PCR (Flu A&B, Covid) Nasopharyngeal Swab     Status: Abnormal   Collection Time: 06/19/20 10:08 AM   Specimen: Nasopharyngeal Swab; Nasopharyngeal(NP) swabs in vial transport medium  Result Value Ref Range Status   SARS Coronavirus 2 by RT PCR POSITIVE (A) NEGATIVE Final    Comment: emailed L. Berdik RN 12:00 06/19/20 ([email or web address]) (NOTE) SARS-CoV-2 target nucleic acids are DETECTED.  The SARS-CoV-2 RNA is generally detectable in upper respiratory specimens during the acute phase of infection. Positive results are indicative of the presence of the identified virus, but do not rule out bacterial infection or co-infection with other pathogens not detected by the test. Clinical correlation with patient history and other diagnostic information is necessary to determine patient infection status. The expected result is Negative.  Fact Sheet for Patients: EntrepreneurPulse.com.au  Fact Sheet for Healthcare Providers: IncredibleEmployment.be  This test is not yet approved or cleared by the [place] FDA and  has been authorized for detection and/or diagnosis of SARS-CoV-2 by FDA under an Emergency Use Authorization (EUA).  This EUA will remain in effect (meaning this test can be used) for the duration of  the COVID-1 9 declaration under Section 564(b)(1) of the Act, 21 U.S.C. section 360bbb-3(b)(1), unless the authorization is terminated or revoked sooner.     Influenza A by PCR NEGATIVE NEGATIVE Final   Influenza B by PCR NEGATIVE NEGATIVE Final    Comment: (NOTE) The Xpert Xpress SARS-CoV-2/FLU/RSV plus assay is intended as an aid in the diagnosis of influenza from  Nasopharyngeal swab specimens and should not be used as a sole basis for treatment. Nasal washings and aspirates are unacceptable for Xpert Xpress SARS-CoV-2/FLU/RSV testing.  Fact Sheet for Patients: EntrepreneurPulse.com.au  Fact Sheet for Healthcare Providers: IncredibleEmployment.be  This test is not yet approved or cleared by the [place] FDA and has been authorized for detection and/or diagnosis of SARS-CoV-2 by FDA under an Emergency Use Authorization (EUA). This EUA will remain in effect (meaning this test can be used) for the duration of the COVID-19 declaration under Section 564(b)(1) of the Act, 21 U.S.C. section 360bbb-3(b)(1), unless the authorization is terminated or revoked.  Performed at [facility], [place] [address], [place], [place] [address]   Urine culture     Status: Abnormal   Collection Time: 06/19/20  3:04 PM   Specimen: Urine, Random  Result Value Ref Range Status   Specimen Description URINE, RANDOM  Final   Special Requests   Final    NONE Performed at [facility], [place] [address], [place], [place] [address]    Culture 60,000 COLONIES/mL KLEBSIELLA PNEUMONIAE (A)  Final   Report Status 06/22/2020 FINAL  Final   Organism ID, Bacteria KLEBSIELLA PNEUMONIAE (A)  Final      Susceptibility   Klebsiella pneumoniae - MIC*    AMPICILLIN >=32 RESISTANT Resistant     CEFAZOLIN <=4 SENSITIVE Sensitive     CEFEPIME <=0.12 SENSITIVE Sensitive     CEFTRIAXONE <=0.25 SENSITIVE Sensitive     CIPROFLOXACIN <=0.25 SENSITIVE Sensitive     GENTAMICIN <=1 SENSITIVE Sensitive     IMIPENEM <=0.25 SENSITIVE Sensitive     NITROFURANTOIN 64 INTERMEDIATE Intermediate  TRIMETH/SULFA <=20 SENSITIVE Sensitive     AMPICILLIN/SULBACTAM 4 SENSITIVE Sensitive     PIP/TAZO <=4 SENSITIVE Sensitive     * 60,000 COLONIES/mL KLEBSIELLA PNEUMONIAE    [x]  Patient discharged originally without antimicrobial agent  New  antibiotic prescription: No further treatment indicated for asymptomatic bacteriuria  ED Provider: Margarita Mail, PA-C   Margretta Sidle Dohlen 06/23/2020, 8:19 AM Clinical Pharmacist Monday - Friday phone -  [phone] Saturday - Sunday phone - [phone]

## 2020-07-07 DIAGNOSIS — Z6834 Body mass index (BMI) 34.0-34.9, adult: Secondary | ICD-10-CM | POA: Diagnosis not present

## 2020-07-07 DIAGNOSIS — R635 Abnormal weight gain: Secondary | ICD-10-CM | POA: Diagnosis not present

## 2020-07-07 DIAGNOSIS — R7303 Prediabetes: Secondary | ICD-10-CM | POA: Diagnosis not present

## 2020-07-07 DIAGNOSIS — I10 Essential (primary) hypertension: Secondary | ICD-10-CM | POA: Diagnosis not present

## 2020-08-04 DIAGNOSIS — R635 Abnormal weight gain: Secondary | ICD-10-CM | POA: Diagnosis not present

## 2020-08-04 DIAGNOSIS — I10 Essential (primary) hypertension: Secondary | ICD-10-CM | POA: Diagnosis not present

## 2020-08-04 DIAGNOSIS — Z6834 Body mass index (BMI) 34.0-34.9, adult: Secondary | ICD-10-CM | POA: Diagnosis not present

## 2020-08-04 DIAGNOSIS — R7303 Prediabetes: Secondary | ICD-10-CM | POA: Diagnosis not present

## 2020-08-05 DIAGNOSIS — R5383 Other fatigue: Secondary | ICD-10-CM | POA: Diagnosis not present

## 2020-08-05 DIAGNOSIS — J301 Allergic rhinitis due to pollen: Secondary | ICD-10-CM | POA: Diagnosis not present

## 2020-08-05 DIAGNOSIS — J4540 Moderate persistent asthma, uncomplicated: Secondary | ICD-10-CM | POA: Diagnosis not present

## 2020-08-05 DIAGNOSIS — G4733 Obstructive sleep apnea (adult) (pediatric): Secondary | ICD-10-CM | POA: Diagnosis not present

## 2020-08-18 ENCOUNTER — Telehealth: Payer: Self-pay

## 2020-08-18 NOTE — Telephone Encounter (Signed)
   [facility] Pre-operative Risk Assessment    HEARTCARE STAFF: - Please ensure there is not already an duplicate clearance open for this procedure. - Under Visit Info/Reason for Call, type in Other and utilize the format Clearance MM/DD/YY or Clearance TBD. Do not use dashes or single digits. - If request is for dental extraction, please clarify the # of teeth to be extracted.  Request for surgical clearance:  1. What type of surgery is being performed? UNI MEDICAL KNEE   2. When is this surgery scheduled? TBD   3. What type of clearance is required (medical clearance vs. Pharmacy clearance to hold med vs. Both)? BOTH  4. Are there any medications that need to be held prior to surgery and how long? ELIQUIS NEEDS INSTRUCTIONS WHEN TO HOLD   5. Practice name and name of physician performing surgery? [facility], DR. Annie Main D. LUCEY   6. What is the office phone number? [phone]   7.   What is the office fax number? [phone]  8.   Anesthesia type (None, local, MAC, general) ? NONE LISTED   Jacinta Shoe 08/18/2020, 3:38 PM  _________________________________________________________________   (provider comments below)

## 2020-08-20 NOTE — Telephone Encounter (Signed)
Patient called back to make sure we had received the pre op clearance

## 2020-08-21 NOTE — Telephone Encounter (Signed)
Patient with diagnosis of ATRIAL FIBRILLATION on E.LIQUIS for anticoagulation.     Procedure: UNI MEDICAL KNEE Date of procedure: TBD   CHA2DS2-VASc Score = 4  This indicates a 4.8% annual risk of stroke. The patient's score is based upon: CHF History: Yes HTN History: Yes Diabetes History: No Stroke History: No Vascular Disease History: No Age Score: 1 Gender Score: 1  *No history of stroke/VTE/TIA noted*  CrCl > 11ml/min Platelet count = 246  Per office protocol, patient can hold ELIQUIS for 3 days (72 hours) prior to procedure.   Patient will not need bridging with Lovenox (enoxaparin) around procedure.  Please restart Eliquis on the evening of procedure or day after, at discretion of procedure MD  For orthopedic procedures please be sure to resume therapeutic (not prophylactic) dosing.

## 2020-08-22 NOTE — Telephone Encounter (Signed)
° °  Primary Cardiologist: Sinclair Grooms, MD  Chart reviewed as part of pre-operative protocol coverage. Patient was contacted 08/22/2020 in reference to pre-operative risk assessment for pending surgery as outlined below.  Christie Atkinson was last seen on 06/02/20 by Roderic Palau, NP in the atrial fibrillation clinic.  Since that day, Christie Atkinson has done fine from a cardiac standpoint. She can easily complete 4 METs without anginal complaints  Therefore, based on ACC/AHA guidelines, the patient would be at acceptable risk for the planned procedure without further cardiovascular testing.   The patient was advised that if she develops new symptoms prior to surgery to contact our office to arrange for a follow-up visit, and she verbalized understanding.  Per pharmacy recommendations, patient can hold eliquis 3 days prior to her upcoming knee surgery with plans to restart as soon as she is cleared to do so by her orthopedist.   I will route this recommendation to the requesting party via [facility] fax function and remove from pre-op pool. Please call with questions.  Abigail Butts, PA-C 08/22/2020, 3:56 PM

## 2020-08-22 NOTE — Telephone Encounter (Signed)
Patient returning call.

## 2020-08-22 NOTE — Telephone Encounter (Signed)
   Primary Cardiologist: Sinclair Grooms, MD  Chart reviewed as part of pre-operative protocol coverage.   Left a voicemail for patient to call back for ongoing preop assessment.   Abigail Butts, PA-C 08/22/2020, 3:10 PM

## 2020-09-04 DIAGNOSIS — Z6834 Body mass index (BMI) 34.0-34.9, adult: Secondary | ICD-10-CM | POA: Diagnosis not present

## 2020-09-04 DIAGNOSIS — R635 Abnormal weight gain: Secondary | ICD-10-CM | POA: Diagnosis not present

## 2020-09-04 DIAGNOSIS — I10 Essential (primary) hypertension: Secondary | ICD-10-CM | POA: Diagnosis not present

## 2020-09-04 DIAGNOSIS — R7303 Prediabetes: Secondary | ICD-10-CM | POA: Diagnosis not present

## 2020-10-08 DIAGNOSIS — R635 Abnormal weight gain: Secondary | ICD-10-CM | POA: Diagnosis not present

## 2020-10-08 DIAGNOSIS — I10 Essential (primary) hypertension: Secondary | ICD-10-CM | POA: Diagnosis not present

## 2020-10-08 DIAGNOSIS — Z6834 Body mass index (BMI) 34.0-34.9, adult: Secondary | ICD-10-CM | POA: Diagnosis not present

## 2020-10-08 DIAGNOSIS — R7303 Prediabetes: Secondary | ICD-10-CM | POA: Diagnosis not present

## 2020-10-08 NOTE — Progress Notes (Signed)
Left a voicemail at Dr. Ruel Favors office to request pre op orders in epic.

## 2020-10-10 ENCOUNTER — Other Ambulatory Visit: Payer: Self-pay | Admitting: Orthopedic Surgery

## 2020-10-15 ENCOUNTER — Encounter (HOSPITAL_COMMUNITY): Payer: Self-pay

## 2020-10-15 NOTE — Progress Notes (Addendum)
PCP - Dr.  Doyle Askew [facility] Cardiologist -Roby Lofts PA-C clearance 08-22-20 epic [name]  PPM/ICD -  Device Orders -  Rep Notified -   Chest x-ray - 2019 EKG - 06-02-20 epic Stress Test - 2019 ECHO - 2022 epic Cardiac Cath - 2019 hgba1c on chart 09-18-20 Sleep Study -  CPAP -   Fasting Blood Sugar -  Checks Blood Sugar 1-2_____ times a month  Blood Thinner Instructions:Elequis hold 3 days prior Aspirin Instructions:  ERAS Protcol - PRE-SURGERY Ensure or G2-   COVID TEST- 5-12  Activity--Able to walk a flight of syairs without SOB Anesthesia review: OSA DM Asthma, Afib, CHF,HTN  Patient denies shortness of breath, fever, cough and chest pain at PAT appointment   All instructions explained to the patient, with a verbal understanding of the material. Patient agrees to go over the instructions while at home for a better understanding. Patient also instructed to self quarantine after being tested for COVID-19. The opportunity to ask questions was provided.

## 2020-10-15 NOTE — Patient Instructions (Addendum)
DUE TO COVID-19 ONLY ONE VISITOR IS ALLOWED TO COME WITH YOU AND STAY IN THE WAITING ROOM ONLY DURING PRE OP AND PROCEDURE DAY OF SURGERY.   TWO VISITOR  MAY VISIT WITH YOU AFTER SURGERY IN YOUR PRIVATE ROOM DURING VISITING HOURS ONLY!  YOU NEED TO HAVE A COVID 19 TEST ON_5-12-22______ @_______ , THIS TEST MUST BE DONE BEFORE SURGERY,  COVID TESTING SITE [address] [place] [address], IT IS ON THE RIGHT GOING OUT [address] APPROXIMATELY  2 MINUTES PAST [employer] ON THE RIGHT. ONCE YOUR COVID TEST IS COMPLETED,  PLEASE BEGIN THE QUARANTINE INSTRUCTIONS AS OUTLINED IN YOUR HANDOUT.                Christie Atkinson  10/15/2020   Your procedure is scheduled on: 10-20-20   Report to [facility] Main  Entrance   Report to admitting at       0655 AM     Call this number if you have problems the morning of surgery [phone]    Remember: NO SOLID FOOD AFTER MIDNIGHT THE NIGHT PRIOR TO SURGERY. NOTHING BY MOUTH EXCEPT CLEAR LIQUIDS UNTIL    0620 am. PLEASE FINISH G2  DRINK PER SURGEON ORDER  WHICH NEEDS TO BE COMPLETED AT        0620 am then nothing by mouth.     CLEAR LIQUID DIET   Foods Allowed                                                                              Foods Excluded water Black Coffee and tea, regular and decaf                             liquids that you cannot  Plain Jell-O any favor except red or purple                                           see through such as: Fruit ices (not with fruit pulp)                                                  milk, soups, orange juice  Iced Popsicles                                                   All solid food Carbonated beverages, regular and diet                                    Cranberry, grape and apple juices Sports drinks like Gatorade Lightly seasoned clear broth or consume(fat free) Sugar, honey  syrup  _____________________________________________________________________  BRUSH YOUR TEETH MORNING OF SURGERY AND RINSE YOUR MOUTH OUT, NO CHEWING GUM CANDY OR MINTS.     Take these medicines the morning of surgery with A SIP OF WATER: metoprolol,inhalers and bring them with you,Tikosyn, dilitiazem  HOLD EVENING DOSE OF VICTOZA DAY BEFORE SURGERY DO NOT TAKE ANY DIABETIC MEDICATIONS DAY OF YOUR SURGERY                               You may not have any metal on your body including hair pins and              piercings  Do not wear jewelry, make-up, lotions, powders or perfumes, deodorant             Do not wear nail polish on your fingernails.  Do not shave  48 hours prior to surgery.     Do not bring valuables to the hospital. [place].  Contacts, dentures or bridgework may not be worn into surgery.      Patients discharged the day of surgery will not be allowed to drive home. IF YOU ARE HAVING SURGERY AND GOING HOME THE SAME DAY, YOU MUST HAVE AN ADULT TO DRIVE YOU HOME AND BE WITH YOU FOR 24 HOURS. YOU MAY GO HOME BY TAXI OR UBER OR ORTHERWISE, BUT AN ADULT MUST ACCOMPANY YOU HOME AND STAY WITH YOU FOR 24 HOURS.  Name and phone number of your driver:  Special Instructions: N/A              Please read over the following fact sheets you were given: _____________________________________________________________________             [facility] - Preparing for Surgery Before surgery, you can play an important role.  Because skin is not sterile, your skin needs to be as free of germs as possible.  You can reduce the number of germs on your skin by washing with CHG (chlorahexidine gluconate) soap before surgery.  CHG is an antiseptic cleaner which kills germs and bonds with the skin to continue killing germs even after washing. Please DO NOT use if you have an allergy to CHG or antibacterial soaps.  If your skin becomes  reddened/irritated stop using the CHG and inform your nurse when you arrive at Short Stay. Do not shave (including legs and underarms) for at least 48 hours prior to the first CHG shower.  You may shave your face/neck. Please follow these instructions carefully:  1.  Shower with CHG Soap the night before surgery and the  morning of Surgery.  2.  If you choose to wash your hair, wash your hair first as usual with your  normal  shampoo.  3.  After you shampoo, rinse your hair and body thoroughly to remove the  shampoo.                           4.  Use CHG as you would any other liquid soap.  You can apply chg directly  to the skin and wash                       Gently with a scrungie or clean washcloth.  5.  Apply the CHG Soap to your body ONLY FROM THE  NECK DOWN.   Do not use on face/ open                           Wound or open sores. Avoid contact with eyes, ears mouth and genitals (private parts).                       Wash face,  Genitals (private parts) with your normal soap.             6.  Wash thoroughly, paying special attention to the area where your surgery  will be performed.  7.  Thoroughly rinse your body with warm water from the neck down.  8.  DO NOT shower/wash with your normal soap after using and rinsing off  the CHG Soap.                9.  Pat yourself dry with a clean towel.            10.  Wear clean pajamas.            11.  Place clean sheets on your bed the night of your first shower and do not  sleep with pets. Day of Surgery : Do not apply any lotions/deodorants the morning of surgery.  Please wear clean clothes to the hospital/surgery center.  FAILURE TO FOLLOW THESE INSTRUCTIONS MAY RESULT IN THE CANCELLATION OF YOUR SURGERY PATIENT SIGNATURE_________________________________  NURSE SIGNATURE__________________________________  ________________________________________________________________________   Christie Atkinson  An incentive spirometer is a tool that  can help keep your lungs clear and active. This tool measures how well you are filling your lungs with each breath. Taking long deep breaths may help reverse or decrease the chance of developing breathing (pulmonary) problems (especially infection) following:  A long period of time when you are unable to move or be active. BEFORE THE PROCEDURE   If the spirometer includes an indicator to show your best effort, your nurse or respiratory therapist will set it to a desired goal.  If possible, sit up straight or lean slightly forward. Try not to slouch.  Hold the incentive spirometer in an upright position. INSTRUCTIONS FOR USE  1. Sit on the edge of your bed if possible, or sit up as far as you can in bed or on a chair. 2. Hold the incentive spirometer in an upright position. 3. Breathe out normally. 4. Place the mouthpiece in your mouth and seal your lips tightly around it. 5. Breathe in slowly and as deeply as possible, raising the piston or the ball toward the top of the column. 6. Hold your breath for 3-5 seconds or for as long as possible. Allow the piston or ball to fall to the bottom of the column. 7. Remove the mouthpiece from your mouth and breathe out normally. 8. Rest for a few seconds and repeat Steps 1 through 7 at least 10 times every 1-2 hours when you are awake. Take your time and take a few normal breaths between deep breaths. 9. The spirometer may include an indicator to show your best effort. Use the indicator as a goal to work toward during each repetition. 10. After each set of 10 deep breaths, practice coughing to be sure your lungs are clear. If you have an incision (the cut made at the time of surgery), support your incision when coughing by placing a pillow or rolled up towels firmly against it. Once you are able  to get out of bed, walk around indoors and cough well. You may stop using the incentive spirometer when instructed by your caregiver.  RISKS AND  COMPLICATIONS  Take your time so you do not get dizzy or light-headed.  If you are in pain, you may need to take or ask for pain medication before doing incentive spirometry. It is harder to take a deep breath if you are having pain. AFTER USE  Rest and breathe slowly and easily.  It can be helpful to keep track of a log of your progress. Your caregiver can provide you with a simple table to help with this. If you are using the spirometer at home, follow these instructions: [place] IF:   You are having difficultly using the spirometer.  You have trouble using the spirometer as often as instructed.  Your pain medication is not giving enough relief while using the spirometer.  You develop fever of 100.5 F (38.1 C) or higher. SEEK IMMEDIATE MEDICAL CARE IF:   You cough up bloody sputum that had not been present before.  You develop fever of 102 F (38.9 C) or greater.  You develop worsening pain at or near the incision site. MAKE SURE YOU:   Understand these instructions.  Will watch your condition.  Will get help right away if you are not doing well or get worse. Document Released: 10/04/2006 Document Revised: 08/16/2011 Document Reviewed: 12/05/2006 [facility] Patient Information 2014 [place], [place].   ________________________________________________________________________

## 2020-10-16 ENCOUNTER — Encounter (HOSPITAL_COMMUNITY)
Admission: RE | Admit: 2020-10-16 | Discharge: 2020-10-16 | Disposition: A | Discharge status: Home / Self Care | Payer: No Typology Code available for payment source | Source: Ambulatory Visit | Attending: Orthopedic Surgery | Admitting: Orthopedic Surgery

## 2020-10-16 ENCOUNTER — Other Ambulatory Visit: Payer: Self-pay

## 2020-10-16 ENCOUNTER — Other Ambulatory Visit (HOSPITAL_COMMUNITY)
Admission: RE | Admit: 2020-10-16 | Discharge: 2020-10-16 | Disposition: A | Discharge status: Home / Self Care | Payer: Medicare Other | Source: Ambulatory Visit | Attending: Orthopedic Surgery | Admitting: Orthopedic Surgery

## 2020-10-16 ENCOUNTER — Encounter (HOSPITAL_COMMUNITY): Payer: Self-pay

## 2020-10-16 DIAGNOSIS — Z20822 Contact with and (suspected) exposure to covid-19: Secondary | ICD-10-CM | POA: Insufficient documentation

## 2020-10-16 DIAGNOSIS — Z79899 Other long term (current) drug therapy: Secondary | ICD-10-CM | POA: Insufficient documentation

## 2020-10-16 DIAGNOSIS — E119 Type 2 diabetes mellitus without complications: Secondary | ICD-10-CM | POA: Diagnosis not present

## 2020-10-16 DIAGNOSIS — Z01812 Encounter for preprocedural laboratory examination: Secondary | ICD-10-CM | POA: Diagnosis not present

## 2020-10-16 DIAGNOSIS — I4891 Unspecified atrial fibrillation: Secondary | ICD-10-CM | POA: Insufficient documentation

## 2020-10-16 DIAGNOSIS — Z7984 Long term (current) use of oral hypoglycemic drugs: Secondary | ICD-10-CM | POA: Diagnosis not present

## 2020-10-16 DIAGNOSIS — G4730 Sleep apnea, unspecified: Secondary | ICD-10-CM | POA: Insufficient documentation

## 2020-10-16 DIAGNOSIS — Z7901 Long term (current) use of anticoagulants: Secondary | ICD-10-CM | POA: Insufficient documentation

## 2020-10-16 DIAGNOSIS — I10 Essential (primary) hypertension: Secondary | ICD-10-CM | POA: Diagnosis not present

## 2020-10-16 DIAGNOSIS — M1711 Unilateral primary osteoarthritis, right knee: Secondary | ICD-10-CM | POA: Diagnosis not present

## 2020-10-16 HISTORY — DX: Personal history of other diseases of the digestive system: Z87.19

## 2020-10-16 HISTORY — DX: Cardiac arrhythmia, unspecified: I49.9

## 2020-10-16 HISTORY — DX: Type 2 diabetes mellitus without complications: E11.9

## 2020-10-16 HISTORY — DX: Plantar fascial fibromatosis: M72.2

## 2020-10-16 HISTORY — DX: Pneumonia, unspecified organism: J18.9

## 2020-10-16 HISTORY — DX: Personal history of urinary calculi: Z87.442

## 2020-10-16 LAB — CBC WITH DIFFERENTIAL/PLATELET
Abs Immature Granulocytes: 0.02 10*3/uL (ref 0.00–0.07)
Basophils Absolute: 0 10*3/uL (ref 0.0–0.1)
Basophils Relative: 1 %
Eosinophils Absolute: 0.1 10*3/uL (ref 0.0–0.5)
Eosinophils Relative: 2 %
HCT: 41.9 % (ref 36.0–46.0)
Hemoglobin: 13.7 g/dL (ref 12.0–15.0)
Immature Granulocytes: 0 %
Lymphocytes Relative: 30 %
Lymphs Abs: 2 10*3/uL (ref 0.7–4.0)
MCH: 31.4 pg (ref 26.0–34.0)
MCHC: 32.7 g/dL (ref 30.0–36.0)
MCV: 96.1 fL (ref 80.0–100.0)
Monocytes Absolute: 0.7 10*3/uL (ref 0.1–1.0)
Monocytes Relative: 11 %
Neutro Abs: 3.9 10*3/uL (ref 1.7–7.7)
Neutrophils Relative %: 56 %
Platelets: 267 10*3/uL (ref 150–400)
RBC: 4.36 MIL/uL (ref 3.87–5.11)
RDW: 12.1 % (ref 11.5–15.5)
WBC: 6.9 10*3/uL (ref 4.0–10.5)
nRBC: 0 % (ref 0.0–0.2)

## 2020-10-16 LAB — COMPREHENSIVE METABOLIC PANEL
ALT: 18 U/L (ref 0–44)
AST: 23 U/L (ref 15–41)
Albumin: 4.3 g/dL (ref 3.5–5.0)
Alkaline Phosphatase: 65 U/L (ref 38–126)
Anion gap: 7 (ref 5–15)
BUN: 21 mg/dL (ref 8–23)
CO2: 28 mmol/L (ref 22–32)
Calcium: 9.3 mg/dL (ref 8.9–10.3)
Chloride: 104 mmol/L (ref 98–111)
Creatinine, Ser: 1.08 mg/dL — ABNORMAL HIGH (ref 0.44–1.00)
GFR, Estimated: 57 mL/min — ABNORMAL LOW (ref >60)
Glucose, Bld: 104 mg/dL — ABNORMAL HIGH (ref 70–99)
Potassium: 4.1 mmol/L (ref 3.5–5.1)
Sodium: 139 mmol/L (ref 135–145)
Total Bilirubin: 0.6 mg/dL (ref 0.3–1.2)
Total Protein: 8.1 g/dL (ref 6.5–8.1)

## 2020-10-16 LAB — SURGICAL PCR SCREEN
MRSA, PCR: NEGATIVE
Staphylococcus aureus: NEGATIVE

## 2020-10-16 LAB — HEMOGLOBIN A1C
Hgb A1c MFr Bld: 5.8 % — ABNORMAL HIGH (ref 4.8–5.6)
Mean Plasma Glucose: 119.76 mg/dL

## 2020-10-16 LAB — GLUCOSE, CAPILLARY: Glucose-Capillary: 114 mg/dL — ABNORMAL HIGH (ref 70–99)

## 2020-10-17 LAB — SARS CORONAVIRUS 2 (TAT 6-24 HRS): SARS Coronavirus 2: NEGATIVE

## 2020-10-17 NOTE — Anesthesia Preprocedure Evaluation (Addendum)
Anesthesia Evaluation  Patient identified by MRN, date of birth, ID band Patient awake    Reviewed: Allergy & Precautions, NPO status , Patient's Chart, lab work & pertinent test results  Airway Mallampati: I       Dental no notable dental hx.    Pulmonary asthma , sleep apnea ,    Pulmonary exam normal        Cardiovascular hypertension, Pt. on medications and Pt. on home beta blockers Normal cardiovascular exam     Neuro/Psych negative psych ROS   GI/Hepatic   Endo/Other  diabetes, Type 2, Oral Hypoglycemic Agents  Renal/GU      Musculoskeletal  (+) Arthritis , Osteoarthritis,    Abdominal (+) + obese,   Peds  Hematology negative hematology ROS (+)   Anesthesia Other Findings  1. Left ventricular ejection fraction, by estimation, is 60 to 65%. The  left ventricle has normal function. The left ventricle has no regional  wall motion abnormalities. Left ventricular diastolic parameters were  normal.  2. Right ventricular systolic function is normal. The right ventricular  size is normal. There is normal pulmonary artery systolic pressure.  3. Left atrial size was moderately dilated.  4. Cannot r/o PFO.  5. The mitral valve is degenerative. Trivial mitral valve regurgitation.  No evidence of mitral stenosis.  6. The aortic valve is tricuspid. Aortic valve regurgitation is not  visualized. Mild aortic valve sclerosis is present, with no evidence of  aortic valve stenosis.  7. The inferior vena cava is normal in size with greater than 50%  respiratory variability, suggesting right atrial pressure of 3 mmHg.   FINDINGS  Left Ventricle: Left ventricular ejection fraction, by estimation, is 60  to 65%. The left ventricle has normal function. The left ventricle has no  regional wall motion abnormalities. The left ventricular internal cavity  size was normal in size. There is  no left ventricular  hypertrophy. Left ventricular diastolic parameters  were normal.   Right Ventricle: The right ventricular size is normal. No increase in  right ventricular wall thickness. Right ventricular systolic function is  normal. There is normal pulmonary artery systolic pressure. The tricuspid  regurgitant velocity is 2.35 m/s, and  with an assumed right atrial pressure of 3 mmHg, the estimated right  ventricular systolic pressure is 24.2 mmHg.   Left Atrium: Left atrial size was moderately dilated.   Right Atrium: Right atrial size was normal in size.   Pericardium: There is no evidence of pericardial effusion.   Mitral Valve: The mitral valve is degenerative in appearance. There is  mild thickening of the mitral valve leaflet(s). There is mild  calcification of the mitral valve leaflet(s). Mild mitral annular  calcification. Trivial mitral valve regurgitation. No  evidence of mitral valve stenosis.   Tricuspid Valve: The tricuspid valve is normal in structure. Tricuspid  valve regurgitation is mild . No evidence of tricuspid stenosis.   Aortic Valve: The aortic valve is tricuspid. Aortic valve regurgitation is  not visualized. Mild aortic valve sclerosis is present, with no evidence  of aortic valve stenosis.   Pulmonic Valve: The pulmonic valve was normal in structure. Pulmonic valve  regurgitation is not visualized. No evidence of pulmonic stenosis.   Aorta: The aortic root is normal in size and structure.   Venous: The inferior vena cava is normal in size with greater than 50%  respiratory variability, suggesting right atrial pressure of 3 mmHg.   IAS/Shunts: The interatrial septum was not well visualized.  LEFT VENTRICLE  PLAX 2D  LVIDd:     4.90 cm Diastology  LVIDs:     3.20 cm LV e' medial:  5.98 cm/s  LV PW:     1.95 cm LV E/e' medial: 12.4  LV IVS:    0.90 cm LV e' lateral:  9.36 cm/s  LVOT diam:   1.90 cm LV E/e' lateral: 7.9  LV SV:      59  LV SV Index:  29  LVOT Area:   2.84 cm     RIGHT VENTRICLE       IVC  RV S prime:   16.50 cm/s IVC diam: 1.30 cm  TAPSE (M-mode): 1.9 cm   LEFT ATRIUM       Index    RIGHT ATRIUM      Index  LA diam:    4.40 cm 2.18 cm/m RA Area:   12.80 cm  LA Vol (A2C):  71.3 ml 35.37 ml/m RA Volume:  28.90 ml 14.34 ml/m  LA Vol (A4C):  74.1 ml 36.76 ml/m  LA Biplane Vol: 75.9 ml 37.65 ml/m  AORTIC VALVE  LVOT Vmax:  102.00 cm/s  LVOT Vmean: 66.700 cm/s  LVOT VTI:  0.209 m    AORTA  Ao Root diam: 3.50 cm   MITRAL VALVE        TRICUSPID VALVE  MV Area (PHT): 2.73 cm  TR Peak grad:  22.1 mmHg  MV Decel Time: 278 msec  TR Vmax:    235.00 cm/s  MV E velocity: 74.10 cm/s  MV A velocity: 63.40 cm/s SHUNTS  MV E/A ratio: 1.17    Systemic VTI: 0.21 m               Systemic Diam: 1.90 cm   Jenkins Rouge MD  Electronically signed by Jenkins Rouge MD  Signature Date/Time: 06/11/2020/12:40:30 PM      Final    Imaging Info  ECHOCARDIOGRAM COMPLETE (Order #[id number]) on 06/11/20  Study History  ECHOCARDIOGRAM COMPLETE (Order #[id number]) on 06/11/20  [employer] Images  Show images for ECHOCARDIOGRAM COMPLETE  Images on [employer]  Show images for Otho Bellows A  Performing Technologist/Nurse  Performing Technologist/Nurse: Kenney Houseman, RDCS   Reason for Exam Priority: Routine Dx: Persistent atrial fibrillation (HCC) (I48.19 (ICD-10-CM))  Surgical History   Surgical History   No past medical history on file.   Other Surgical History   Procedure Laterality Date Comment Source ABDOMINAL HYSTERECTOMY     ABLATION OF DYSRHYTHMIC FOCUS  03/09/2018   APPENDECTOMY     ATRIAL FIBRILLATION ABLATION N/A 03/09/2018 Procedure: ATRIAL FIBRILLATION ABLATION; Surgeon: [name], MD; Location: [place] [facility]; Service: Cardiovascular; Laterality:  N/A;  CARDIOVERSION N/A 07/04/2017 Procedure: CARDIOVERSION; Surgeon: [name], MD; Location: [facility] ENDOSCOPY; Service: Cardiovascular; Laterality: N/A;  CARDIOVERSION N/A 09/06/2017 Procedure: CARDIOVERSION; Surgeon: [name], MD; Location: [facility] ENDOSCOPY; Service: Cardiovascular; Laterality: N/A;  CHOLECYSTECTOMY N/A 02/24/2015 Procedure: LAPAROSCOPIC CHOLECYSTECTOMY; Surgeon: [name], MD; Location: [place]; Service: General; Laterality: N/A;  EXCISION HAGLUND'S DEFORMITY WITH ACHILLES TENDON REPAIR Left 12/21/2018 Procedure: Left Achilles Tendon Debridement and Reconstruction; Excision of Haglund Deformity; Surgeon: [name], MD; Location: [place]; Service: Orthopedics; Laterality: Left;  GASTROCNEMIUS RECESSION Left 12/21/2018 Procedure: Left Gastroc Recession; Surgeon: [name], MD; Location: [place]; Service: Orthopedics; Laterality: Left;  RIGHT/LEFT HEART CATH AND CORONARY ANGIOGRAPHY N/A 08/09/2017 Procedure: RIGHT/LEFT HEART CATH AND CORONARY ANGIOGRAPHY; Surgeon: [name], MD; Location: [place] [facility]; Service: Cardiovascular;  Laterality: N/A;  TEE WITHOUT CARDIOVERSION N/A 07/04/2017 Procedure: TRANSESOPHAGEAL ECHOCARDIOGRAM (TEE); Surgeon: [name], MD; [facility]; Service: Cardiovascular; Laterality: N/A;  TEE WITHOUT CARDIOVERSION N/A 03/09/2018 Procedure: TRANSESOPHAGEAL ECHOCARDIOGRAM (TEE); Surgeon: [name], MD; Location: [facility] ENDOSCOPY; Service: Cardiovascular; Laterality: N/A;  VAGINAL DELIVERY   x4    Implants    No active implants to display in this view.  Encounter-Level Documents on 06/11/2020:  Electronic signature on 06/11/2020 11:02 AM - 1 of 3 e-signatures recorded    Order-Level Documents on 06/11/2020:  Scan on 06/11/2020 12:40 PM by Default, Provider, MD    Resulted by:  Signed Date/Time  Phone Pager [name] 06/11/2020 12:40  PM [phone]   External Result Report  External Result Report  ECHOCARDIOGRAM COMPLETE: Patient Communication  Add Comments Add Notifications   Existing Charges  Charge Line Charge Code Status Charge Trigger Charge Type [id number] Hc Tte 2d Echo Complete ([id number]) [facility] end exam Technical [id number] ECHO [place] [facility] result study Professional    Reproductive/Obstetrics                           Anesthesia Physical Anesthesia Plan  ASA: II  Anesthesia Plan: Spinal   Post-op Pain Management:  Regional for Post-op pain   Induction:   PONV Risk Score and Plan: 2 and Ondansetron, Midazolam and Treatment may vary due to age or medical condition  Airway Management Planned: Natural Airway and Simple Face Mask  Additional Equipment: None  Intra-op Plan:   Post-operative Plan:   Informed Consent: I have reviewed the patients History and Physical, chart, labs and discussed the procedure including the risks, benefits and alternatives for the proposed anesthesia with the patient or authorized representative who has indicated his/her understanding and acceptance.       Plan Discussed with: CRNA  Anesthesia Plan Comments: (See PAT note 10/16/2020, Konrad Felix, PA-C)       Anesthesia Quick Evaluation

## 2020-10-17 NOTE — Progress Notes (Signed)
Called pt to inform her of time change for surgery on 10/20/20. Pt to arrive  0630 for 0900 surgery. ERAS drink to be completed by 0600 that AM. She verbalizes understanding.

## 2020-10-17 NOTE — Progress Notes (Signed)
Anesthesia Chart Review   Case: [id number] Date/Time: 10/20/20 0910   Procedure: TOTAL KNEE ARTHROPLASTY (Right Knee)   Anesthesia type: Spinal   Pre-op diagnosis: Osteoarthritis of right knee M17.11   Location: [facility] ROOM 07 / [facility]   Surgeons: Vickey Huger, MD      DISCUSSION:64 y.o. with h/o HTN, DM II, atrial fibrillation (on Eliquis), sleep apnea (wears oral device), right knee OA scheduled for above procedure 10/20/2020 with Dr. Vickey Huger.   Per cardiology preoperative evaluation 08/22/2020, "Chart reviewed as part of pre-operative protocol coverage. Patient was contacted 08/22/2020 in reference to pre-operative risk assessment for pending surgery as outlined below.  Christie Atkinson was last seen on 06/02/20 by Roderic Palau, NP in the atrial fibrillation clinic.  Since that day, Christie Atkinson has done fine from a cardiac standpoint. She can easily complete 4 METs without anginal complaints  Therefore, based on ACC/AHA guidelines, the patient would be at acceptable risk for the planned procedure without further cardiovascular testing."  Anticipate pt can proceed with planned procedure barring acute status change.   VS: BP 133/74   Pulse 73   Temp 36.8 C (Oral)   Resp 16   Ht 5\' 5"  (1.651 m)   Wt 97.5 kg   SpO2 98%   BMI 35.78 kg/m   PROVIDERS: Wallie Char, FNP is PCP   Daneen Schick, MD is Cardiologist  LABS: Labs reviewed: Acceptable for surgery. (all labs ordered are listed, but only abnormal results are displayed)  Labs Reviewed  HEMOGLOBIN A1C - Abnormal; Notable for the following components:      Result Value   Hgb A1c MFr Bld 5.8 (*)    All other components within normal limits  COMPREHENSIVE METABOLIC PANEL - Abnormal; Notable for the following components:   Glucose, Bld 104 (*)    Creatinine, Ser 1.08 (*)    GFR, Estimated 57 (*)    All other components within normal limits  GLUCOSE, CAPILLARY - Abnormal; Notable for the following components:    Glucose-Capillary 114 (*)    All other components within normal limits  SURGICAL PCR SCREEN  CBC WITH DIFFERENTIAL/PLATELET     IMAGES:   EKG: 06/02/2020 Rate 72 bpm  NSR  CV: Echo 06/11/2020 1. Left ventricular ejection fraction, by estimation, is 60 to 65%. The  left ventricle has normal function. The left ventricle has no regional  wall motion abnormalities. Left ventricular diastolic parameters were  normal.  2. Right ventricular systolic function is normal. The right ventricular  size is normal. There is normal pulmonary artery systolic pressure.  3. Left atrial size was moderately dilated.  4. Cannot r/o PFO.  5. The mitral valve is degenerative. Trivial mitral valve regurgitation.  No evidence of mitral stenosis.  6. The aortic valve is tricuspid. Aortic valve regurgitation is not  visualized. Mild aortic valve sclerosis is present, with no evidence of  aortic valve stenosis.  7. The inferior vena cava is normal in size with greater than 50%  respiratory variability, suggesting right atrial pressure of 3 mmHg.  Cardiac Cath 08/09/2017  Normal left main.  Normal left anterior descending coronary artery.  Normal circumflex coronary artery.  Normal dominant RCA.  Normal LV size with normal hemodynamics and estimated ejection fraction of 50%.  False positive myocardial perfusion imaging.  Mild pulmonary hypertension with mean pressure of 27 mmHg.  RECOMMENDATIONS:   Management of atrial fibrillation with aggressive rate control and stroke prophylaxis.   Past Medical History:  Diagnosis Date  . Asthma   . Atrial fibrillation ([place])   . Back pain   . Diabetes mellitus without complication ([place])   . Diastolic dysfunction   . Dysrhythmia    a fib  . Fibromyalgia    neuropathy feet  . History of hiatal hernia   . History of kidney stones   . Hypertension   . Knee pain   . Osteoarthritis   . Persistent atrial fibrillation ([place])   . Plantar  fasciitis   . Pneumonia   . Sleep apnea    wears oral appliance  . Visit for monitoring Tikosyn therapy 02/07/2018    Past Surgical History:  Procedure Laterality Date  . ABDOMINAL HYSTERECTOMY    . ABLATION OF DYSRHYTHMIC FOCUS  03/09/2018  . APPENDECTOMY    . ATRIAL FIBRILLATION ABLATION N/A 03/09/2018   Procedure: ATRIAL FIBRILLATION ABLATION;  Surgeon: [name], MD;  Location: [place] [facility];  Service: Cardiovascular;  Laterality: N/A;  . CARDIOVERSION N/A 07/04/2017   Procedure: CARDIOVERSION;  Surgeon: [name], MD;  Location: [facility] ENDOSCOPY;  Service: Cardiovascular;  Laterality: N/A;  . CARDIOVERSION N/A 09/06/2017   Procedure: CARDIOVERSION;  Surgeon: [name], MD;  Location: [facility] ENDOSCOPY;  Service: Cardiovascular;  Laterality: N/A;  . CHOLECYSTECTOMY N/A 02/24/2015   Procedure: LAPAROSCOPIC CHOLECYSTECTOMY;  Surgeon: [name], MD;  Location: [place];  Service: General;  Laterality: N/A;  . EXCISION HAGLUND'S DEFORMITY WITH ACHILLES TENDON REPAIR Left 12/21/2018   Procedure: Left Achilles Tendon Debridement and Reconstruction; Excision of Haglund Deformity;  Surgeon: [name], MD;  Location: [place];  Service: Orthopedics;  Laterality: Left;  [name] GASTROCNEMIUS RECESSION Left 12/21/2018   Procedure: Left Gastroc Recession;  Surgeon: [name], MD;  Location: [place];  Service: Orthopedics;  Laterality: Left;  . RIGHT/LEFT HEART CATH AND CORONARY ANGIOGRAPHY N/A 08/09/2017   Procedure: RIGHT/LEFT HEART CATH AND CORONARY ANGIOGRAPHY;  Surgeon: [name], MD;  Location: [place] [facility];  Service: Cardiovascular;  Laterality: N/A;  . TEE WITHOUT CARDIOVERSION N/A 07/04/2017   Procedure: TRANSESOPHAGEAL ECHOCARDIOGRAM (TEE);  Surgeon: [name], MD;  Location: [facility] ENDOSCOPY;  Service: Cardiovascular;  Laterality: N/A;  . TEE WITHOUT CARDIOVERSION N/A 03/09/2018   Procedure: TRANSESOPHAGEAL ECHOCARDIOGRAM (TEE);  Surgeon:  [name], MD;  Location: [facility] ENDOSCOPY;  Service: Cardiovascular;  Laterality: N/A;  . VAGINAL DELIVERY     x4    MEDICATIONS: . acidophilus (RISAQUAD) CAPS capsule  . albuterol (VENTOLIN HFA) 108 (90 Base) MCG/ACT inhaler  . apixaban (ELIQUIS) 5 MG TABS tablet  . Coenzyme Q10 (CO Q10 PO)  . diltiazem (CARDIZEM CD) 120 MG 24 hr capsule  . dofetilide (TIKOSYN) 125 MCG capsule  . Fluticasone-Salmeterol (ADVAIR) 100-50 MCG/DOSE AEPB  . furosemide (LASIX) 20 MG tablet  . lidocaine (LIDODERM) 5 %  . liraglutide (VICTOZA) 18 MG/3ML SOPN  . Magnesium Gluconate 500 (27 Mg) MG TABS  . metFORMIN (GLUCOPHAGE) 500 MG tablet  . metoprolol succinate (TOPROL-XL) 50 MG 24 hr tablet  . Multiple Vitamins-Minerals (WOMENS MULTIVITAMIN) TABS  . potassium chloride SA (KLOR-CON) 20 MEQ tablet  . vitamin E 180 MG (400 UNITS) capsule   No current facility-administered medications for this encounter.     Konrad Felix, PA-C [facility] [phone]

## 2020-10-19 MED ORDER — BUPIVACAINE LIPOSOME 1.3 % IJ SUSP
20.0000 mL | INTRAMUSCULAR | Status: DC
  Filled 2020-10-19: qty 20

## 2020-10-20 ENCOUNTER — Observation Stay (HOSPITAL_COMMUNITY)
Admission: RE | Admit: 2020-10-20 | Discharge: 2020-10-21 | Disposition: A | Discharge status: Home / Self Care | Payer: No Typology Code available for payment source | Attending: Orthopedic Surgery | Admitting: Orthopedic Surgery

## 2020-10-20 ENCOUNTER — Ambulatory Visit (HOSPITAL_COMMUNITY): Payer: No Typology Code available for payment source | Admitting: Physician Assistant

## 2020-10-20 ENCOUNTER — Encounter (HOSPITAL_COMMUNITY): Payer: Self-pay | Admitting: Orthopedic Surgery

## 2020-10-20 ENCOUNTER — Ambulatory Visit (HOSPITAL_COMMUNITY): Payer: No Typology Code available for payment source | Admitting: Certified Registered"

## 2020-10-20 ENCOUNTER — Encounter (HOSPITAL_COMMUNITY)
Admission: RE | Disposition: A | Discharge status: Home / Self Care | Payer: Self-pay | Source: Home / Self Care | Attending: Orthopedic Surgery

## 2020-10-20 DIAGNOSIS — I110 Hypertensive heart disease with heart failure: Secondary | ICD-10-CM | POA: Insufficient documentation

## 2020-10-20 DIAGNOSIS — Z7722 Contact with and (suspected) exposure to environmental tobacco smoke (acute) (chronic): Secondary | ICD-10-CM | POA: Diagnosis not present

## 2020-10-20 DIAGNOSIS — Z79899 Other long term (current) drug therapy: Secondary | ICD-10-CM | POA: Diagnosis not present

## 2020-10-20 DIAGNOSIS — G4730 Sleep apnea, unspecified: Secondary | ICD-10-CM | POA: Diagnosis not present

## 2020-10-20 DIAGNOSIS — Z7984 Long term (current) use of oral hypoglycemic drugs: Secondary | ICD-10-CM | POA: Insufficient documentation

## 2020-10-20 DIAGNOSIS — I5032 Chronic diastolic (congestive) heart failure: Secondary | ICD-10-CM | POA: Insufficient documentation

## 2020-10-20 DIAGNOSIS — M1711 Unilateral primary osteoarthritis, right knee: Principal | ICD-10-CM | POA: Insufficient documentation

## 2020-10-20 DIAGNOSIS — E119 Type 2 diabetes mellitus without complications: Secondary | ICD-10-CM | POA: Insufficient documentation

## 2020-10-20 DIAGNOSIS — Z96659 Presence of unspecified artificial knee joint: Secondary | ICD-10-CM

## 2020-10-20 DIAGNOSIS — Z7901 Long term (current) use of anticoagulants: Secondary | ICD-10-CM | POA: Insufficient documentation

## 2020-10-20 DIAGNOSIS — J45909 Unspecified asthma, uncomplicated: Secondary | ICD-10-CM | POA: Insufficient documentation

## 2020-10-20 DIAGNOSIS — I4819 Other persistent atrial fibrillation: Secondary | ICD-10-CM | POA: Insufficient documentation

## 2020-10-20 DIAGNOSIS — G8918 Other acute postprocedural pain: Secondary | ICD-10-CM | POA: Diagnosis not present

## 2020-10-20 HISTORY — DX: Presence of unspecified artificial knee joint: Z96.659

## 2020-10-20 HISTORY — PX: TOTAL KNEE ARTHROPLASTY: SHX125

## 2020-10-20 LAB — GLUCOSE, CAPILLARY
Glucose-Capillary: 120 mg/dL — ABNORMAL HIGH (ref 70–99)
Glucose-Capillary: 154 mg/dL — ABNORMAL HIGH (ref 70–99)

## 2020-10-20 SURGERY — ARTHROPLASTY, KNEE, TOTAL
Anesthesia: Spinal | Site: Knee | Laterality: Right

## 2020-10-20 MED ORDER — PROPOFOL 10 MG/ML IV BOLUS
INTRAVENOUS | Status: AC
  Filled 2020-10-20: qty 20

## 2020-10-20 MED ORDER — PROPOFOL 500 MG/50ML IV EMUL
INTRAVENOUS | Status: DC | PRN
  Administered 2020-10-20: 75 ug/kg/min via INTRAVENOUS

## 2020-10-20 MED ORDER — LIRAGLUTIDE 18 MG/3ML ~~LOC~~ SOPN: 1.8000 mg | PEN_INJECTOR | Freq: Every evening | SUBCUTANEOUS | Status: DC

## 2020-10-20 MED ORDER — CEFAZOLIN SODIUM-DEXTROSE 2-4 GM/100ML-% IV SOLN
2.0000 g | Freq: Four times a day (QID) | INTRAVENOUS | Status: AC
Start: 2020-10-20 — End: 2020-10-21
  Administered 2020-10-20 (×2): 2 g via INTRAVENOUS
  Filled 2020-10-20 (×2): qty 100

## 2020-10-20 MED ORDER — FLEET ENEMA 7-19 GM/118ML RE ENEM: 1.0000 | ENEMA | Freq: Once | RECTAL | Status: DC | PRN

## 2020-10-20 MED ORDER — BUPIVACAINE-EPINEPHRINE (PF) 0.25% -1:200000 IJ SOLN
INTRAMUSCULAR | Status: AC
  Filled 2020-10-20: qty 30

## 2020-10-20 MED ORDER — MOMETASONE FURO-FORMOTEROL FUM 100-5 MCG/ACT IN AERO
2.0000 | INHALATION_SPRAY | Freq: Two times a day (BID) | RESPIRATORY_TRACT | Status: DC
  Filled 2020-10-20: qty 8.8

## 2020-10-20 MED ORDER — ROPIVACAINE HCL 5 MG/ML IJ SOLN
INTRAMUSCULAR | Status: DC | PRN
  Administered 2020-10-20 (×3): 5 mL via PERINEURAL

## 2020-10-20 MED ORDER — BUPIVACAINE IN DEXTROSE 0.75-8.25 % IT SOLN
INTRATHECAL | Status: DC | PRN
  Administered 2020-10-20: 1.6 mL via INTRATHECAL

## 2020-10-20 MED ORDER — DOCUSATE SODIUM 100 MG PO CAPS
100.0000 mg | ORAL_CAPSULE | Freq: Two times a day (BID) | ORAL | Status: DC
  Administered 2020-10-20 – 2020-10-21 (×3): 100 mg via ORAL
  Filled 2020-10-20 (×3): qty 1

## 2020-10-20 MED ORDER — HYDROMORPHONE HCL 1 MG/ML IJ SOLN
0.5000 mg | INTRAMUSCULAR | Status: DC | PRN
  Administered 2020-10-20: 1 mg via INTRAVENOUS
  Filled 2020-10-20: qty 1

## 2020-10-20 MED ORDER — POVIDONE-IODINE 10 % EX SWAB
2.0000 "application " | Freq: Once | CUTANEOUS | Status: AC
  Administered 2020-10-20: 2 via TOPICAL

## 2020-10-20 MED ORDER — DOFETILIDE 125 MCG PO CAPS
125.0000 ug | ORAL_CAPSULE | Freq: Two times a day (BID) | ORAL | Status: DC
  Administered 2020-10-20 – 2020-10-21 (×2): 125 ug via ORAL
  Filled 2020-10-20 (×3): qty 1

## 2020-10-20 MED ORDER — PHENYLEPHRINE 40 MCG/ML (10ML) SYRINGE FOR IV PUSH (FOR BLOOD PRESSURE SUPPORT)
PREFILLED_SYRINGE | INTRAVENOUS | Status: DC | PRN
  Administered 2020-10-20: 80 ug via INTRAVENOUS
  Administered 2020-10-20 (×3): 40 ug via INTRAVENOUS

## 2020-10-20 MED ORDER — DEXAMETHASONE SODIUM PHOSPHATE 10 MG/ML IJ SOLN
8.0000 mg | Freq: Once | INTRAMUSCULAR | Status: AC
  Administered 2020-10-20: 8 mg via INTRAVENOUS

## 2020-10-20 MED ORDER — EPHEDRINE 5 MG/ML INJ
INTRAVENOUS | Status: AC
  Filled 2020-10-20: qty 10

## 2020-10-20 MED ORDER — FERROUS SULFATE 325 (65 FE) MG PO TABS
325.0000 mg | ORAL_TABLET | Freq: Three times a day (TID) | ORAL | Status: DC
  Administered 2020-10-20 – 2020-10-21 (×3): 325 mg via ORAL
  Filled 2020-10-20 (×3): qty 1

## 2020-10-20 MED ORDER — PHENYLEPHRINE 40 MCG/ML (10ML) SYRINGE FOR IV PUSH (FOR BLOOD PRESSURE SUPPORT)
PREFILLED_SYRINGE | INTRAVENOUS | Status: AC
  Filled 2020-10-20: qty 10

## 2020-10-20 MED ORDER — PANTOPRAZOLE SODIUM 40 MG PO TBEC
40.0000 mg | DELAYED_RELEASE_TABLET | Freq: Every day | ORAL | Status: DC
  Administered 2020-10-20: 40 mg via ORAL
  Filled 2020-10-20: qty 1

## 2020-10-20 MED ORDER — PHENOL 1.4 % MT LIQD: 1.0000 | OROMUCOSAL | Status: DC | PRN

## 2020-10-20 MED ORDER — MENTHOL 3 MG MT LOZG: 1.0000 | LOZENGE | OROMUCOSAL | Status: DC | PRN

## 2020-10-20 MED ORDER — APIXABAN 5 MG PO TABS
5.0000 mg | ORAL_TABLET | Freq: Two times a day (BID) | ORAL | Status: DC
  Administered 2020-10-21: 5 mg via ORAL
  Filled 2020-10-20: qty 1

## 2020-10-20 MED ORDER — TRANEXAMIC ACID-NACL 1000-0.7 MG/100ML-% IV SOLN
1000.0000 mg | Freq: Once | INTRAVENOUS | Status: AC
  Administered 2020-10-20: 1000 mg via INTRAVENOUS
  Filled 2020-10-20: qty 100

## 2020-10-20 MED ORDER — CLONIDINE HCL (ANALGESIA) 100 MCG/ML EP SOLN
EPIDURAL | Status: DC | PRN
  Administered 2020-10-20: 100 ug

## 2020-10-20 MED ORDER — GABAPENTIN 300 MG PO CAPS
300.0000 mg | ORAL_CAPSULE | Freq: Once | ORAL | Status: AC
  Administered 2020-10-20: 300 mg via ORAL
  Filled 2020-10-20: qty 1

## 2020-10-20 MED ORDER — MIDAZOLAM HCL 2 MG/2ML IJ SOLN
INTRAMUSCULAR | Status: AC
  Filled 2020-10-20: qty 2

## 2020-10-20 MED ORDER — DILTIAZEM HCL ER COATED BEADS 240 MG PO CP24
240.0000 mg | ORAL_CAPSULE | Freq: Every day | ORAL | Status: DC
  Administered 2020-10-21: 240 mg via ORAL
  Filled 2020-10-20: qty 1

## 2020-10-20 MED ORDER — ACETAMINOPHEN 500 MG PO TABS
1000.0000 mg | ORAL_TABLET | Freq: Four times a day (QID) | ORAL | Status: AC
  Administered 2020-10-20 – 2020-10-21 (×4): 1000 mg via ORAL
  Filled 2020-10-20 (×4): qty 2

## 2020-10-20 MED ORDER — TRANEXAMIC ACID-NACL 1000-0.7 MG/100ML-% IV SOLN
1000.0000 mg | INTRAVENOUS | Status: AC
  Administered 2020-10-20: 1000 mg via INTRAVENOUS
  Filled 2020-10-20: qty 100

## 2020-10-20 MED ORDER — FENTANYL CITRATE (PF) 100 MCG/2ML IJ SOLN
INTRAMUSCULAR | Status: AC
  Filled 2020-10-20: qty 2

## 2020-10-20 MED ORDER — MIDAZOLAM HCL 2 MG/2ML IJ SOLN
INTRAMUSCULAR | Status: DC | PRN
  Administered 2020-10-20: 1 mg via INTRAVENOUS

## 2020-10-20 MED ORDER — ACETAMINOPHEN 500 MG PO TABS
1000.0000 mg | ORAL_TABLET | Freq: Once | ORAL | Status: AC
  Administered 2020-10-20: 1000 mg via ORAL
  Filled 2020-10-20: qty 2

## 2020-10-20 MED ORDER — METFORMIN HCL 500 MG PO TABS
500.0000 mg | ORAL_TABLET | Freq: Two times a day (BID) | ORAL | Status: DC | PRN
  Administered 2020-10-20: 500 mg via ORAL

## 2020-10-20 MED ORDER — SODIUM CHLORIDE (PF) 0.9 % IJ SOLN
INTRAMUSCULAR | Status: AC
  Filled 2020-10-20: qty 20

## 2020-10-20 MED ORDER — SENNOSIDES-DOCUSATE SODIUM 8.6-50 MG PO TABS: 1.0000 | ORAL_TABLET | Freq: Every evening | ORAL | Status: DC | PRN

## 2020-10-20 MED ORDER — ONDANSETRON HCL 4 MG/2ML IJ SOLN
INTRAMUSCULAR | Status: DC | PRN
  Administered 2020-10-20: 4 mg via INTRAVENOUS

## 2020-10-20 MED ORDER — BUPIVACAINE LIPOSOME 1.3 % IJ SUSP
INTRAMUSCULAR | Status: DC | PRN
  Administered 2020-10-20: 20 mL

## 2020-10-20 MED ORDER — OXYCODONE HCL 5 MG PO TABS
5.0000 mg | ORAL_TABLET | ORAL | Status: DC | PRN
  Administered 2020-10-20 – 2020-10-21 (×5): 10 mg via ORAL
  Filled 2020-10-20 (×5): qty 2

## 2020-10-20 MED ORDER — DEXAMETHASONE SODIUM PHOSPHATE 10 MG/ML IJ SOLN
10.0000 mg | Freq: Once | INTRAMUSCULAR | Status: AC
Start: 2020-10-21 — End: 2020-10-21
  Administered 2020-10-21: 10 mg via INTRAVENOUS
  Filled 2020-10-20: qty 1

## 2020-10-20 MED ORDER — METOPROLOL SUCCINATE ER 50 MG PO TB24
50.0000 mg | ORAL_TABLET | Freq: Every day | ORAL | Status: DC
  Filled 2020-10-20: qty 1

## 2020-10-20 MED ORDER — METHOCARBAMOL 500 MG PO TABS
500.0000 mg | ORAL_TABLET | Freq: Four times a day (QID) | ORAL | Status: DC | PRN
  Administered 2020-10-21 (×2): 500 mg via ORAL
  Filled 2020-10-20 (×2): qty 1

## 2020-10-20 MED ORDER — DILTIAZEM HCL ER COATED BEADS 120 MG PO CP24
120.0000 mg | ORAL_CAPSULE | Freq: Every day | ORAL | Status: DC
  Administered 2020-10-20: 120 mg via ORAL
  Filled 2020-10-20: qty 1

## 2020-10-20 MED ORDER — METHOCARBAMOL 500 MG IVPB - SIMPLE MED
500.0000 mg | Freq: Four times a day (QID) | INTRAVENOUS | Status: DC | PRN
  Filled 2020-10-20: qty 50

## 2020-10-20 MED ORDER — BISACODYL 5 MG PO TBEC: 5.0000 mg | DELAYED_RELEASE_TABLET | Freq: Every day | ORAL | Status: DC | PRN

## 2020-10-20 MED ORDER — LACTATED RINGERS IV SOLN: INTRAVENOUS | Status: DC

## 2020-10-20 MED ORDER — ONDANSETRON HCL 4 MG PO TABS
4.0000 mg | ORAL_TABLET | Freq: Four times a day (QID) | ORAL | Status: DC | PRN
Start: 2020-10-20 — End: 2020-10-21
  Administered 2020-10-21: 4 mg via ORAL
  Filled 2020-10-20: qty 1

## 2020-10-20 MED ORDER — PROPOFOL 1000 MG/100ML IV EMUL
INTRAVENOUS | Status: AC
  Filled 2020-10-20: qty 100

## 2020-10-20 MED ORDER — SODIUM CHLORIDE 0.9% FLUSH
INTRAVENOUS | Status: DC | PRN
  Administered 2020-10-20: 20 mL

## 2020-10-20 MED ORDER — DIPHENHYDRAMINE HCL 12.5 MG/5ML PO ELIX
12.5000 mg | ORAL_SOLUTION | ORAL | Status: DC | PRN
Start: 2020-10-20 — End: 2020-10-21
  Administered 2020-10-21: 12.5 mg via ORAL
  Filled 2020-10-20: qty 5

## 2020-10-20 MED ORDER — DEXAMETHASONE SODIUM PHOSPHATE 10 MG/ML IJ SOLN
INTRAMUSCULAR | Status: DC | PRN
  Administered 2020-10-20: 10 mg

## 2020-10-20 MED ORDER — ALUM & MAG HYDROXIDE-SIMETH 200-200-20 MG/5ML PO SUSP: 30.0000 mL | ORAL | Status: DC | PRN

## 2020-10-20 MED ORDER — 0.9 % SODIUM CHLORIDE (POUR BTL) OPTIME
TOPICAL | Status: DC | PRN
  Administered 2020-10-20: 1000 mL

## 2020-10-20 MED ORDER — WATER FOR IRRIGATION, STERILE IR SOLN
Status: DC | PRN
  Administered 2020-10-20: 2000 mL

## 2020-10-20 MED ORDER — METOCLOPRAMIDE HCL 5 MG/ML IJ SOLN: 5.0000 mg | Freq: Three times a day (TID) | INTRAMUSCULAR | Status: DC | PRN

## 2020-10-20 MED ORDER — CEFAZOLIN SODIUM-DEXTROSE 2-4 GM/100ML-% IV SOLN
2.0000 g | INTRAVENOUS | Status: AC
  Administered 2020-10-20: 2 g via INTRAVENOUS
  Filled 2020-10-20: qty 100

## 2020-10-20 MED ORDER — EPHEDRINE SULFATE-NACL 50-0.9 MG/10ML-% IV SOSY
PREFILLED_SYRINGE | INTRAVENOUS | Status: DC | PRN
  Administered 2020-10-20: 5 mg via INTRAVENOUS
  Administered 2020-10-20 (×3): 10 mg via INTRAVENOUS

## 2020-10-20 MED ORDER — METOCLOPRAMIDE HCL 5 MG PO TABS: 5.0000 mg | ORAL_TABLET | Freq: Three times a day (TID) | ORAL | Status: DC | PRN

## 2020-10-20 MED ORDER — BUPIVACAINE-EPINEPHRINE 0.25% -1:200000 IJ SOLN
INTRAMUSCULAR | Status: DC | PRN
  Administered 2020-10-20: 30 mL

## 2020-10-20 MED ORDER — LIDOCAINE 2% (20 MG/ML) 5 ML SYRINGE
INTRAMUSCULAR | Status: AC
  Filled 2020-10-20: qty 5

## 2020-10-20 MED ORDER — ORAL CARE MOUTH RINSE: 15.0000 mL | Freq: Once | OROMUCOSAL | Status: AC

## 2020-10-20 MED ORDER — SODIUM CHLORIDE 0.9 % IV SOLN: INTRAVENOUS | Status: DC

## 2020-10-20 MED ORDER — ALBUTEROL SULFATE HFA 108 (90 BASE) MCG/ACT IN AERS: 1.0000 | INHALATION_SPRAY | Freq: Four times a day (QID) | RESPIRATORY_TRACT | Status: DC | PRN

## 2020-10-20 MED ORDER — MAGNESIUM GLUCONATE 500 MG PO TABS
500.0000 mg | ORAL_TABLET | Freq: Every day | ORAL | Status: DC
  Administered 2020-10-20: 500 mg via ORAL
  Filled 2020-10-20: qty 1

## 2020-10-20 MED ORDER — DEXAMETHASONE SODIUM PHOSPHATE 10 MG/ML IJ SOLN
INTRAMUSCULAR | Status: AC
  Filled 2020-10-20: qty 1

## 2020-10-20 MED ORDER — MIDAZOLAM HCL 2 MG/2ML IJ SOLN
1.0000 mg | INTRAMUSCULAR | Status: DC
  Administered 2020-10-20: 2 mg via INTRAVENOUS
  Filled 2020-10-20: qty 2

## 2020-10-20 MED ORDER — SODIUM CHLORIDE 0.9 % IR SOLN
Status: DC | PRN
  Administered 2020-10-20: 1000 mL

## 2020-10-20 MED ORDER — FENTANYL CITRATE (PF) 100 MCG/2ML IJ SOLN
50.0000 ug | INTRAMUSCULAR | Status: DC
  Administered 2020-10-20: 100 ug via INTRAVENOUS
  Filled 2020-10-20: qty 2

## 2020-10-20 MED ORDER — DILTIAZEM HCL ER COATED BEADS 120 MG PO CP24: 120.0000 mg | ORAL_CAPSULE | ORAL | Status: DC

## 2020-10-20 MED ORDER — ROPIVACAINE HCL 7.5 MG/ML IJ SOLN
INTRAMUSCULAR | Status: DC | PRN
  Administered 2020-10-20 (×4): 5 mL via PERINEURAL

## 2020-10-20 MED ORDER — ONDANSETRON HCL 4 MG/2ML IJ SOLN
INTRAMUSCULAR | Status: AC
  Filled 2020-10-20: qty 2

## 2020-10-20 MED ORDER — ONDANSETRON HCL 4 MG/2ML IJ SOLN: 4.0000 mg | Freq: Four times a day (QID) | INTRAMUSCULAR | Status: DC | PRN

## 2020-10-20 MED ORDER — CHLORHEXIDINE GLUCONATE 0.12 % MT SOLN
15.0000 mL | Freq: Once | OROMUCOSAL | Status: AC
  Administered 2020-10-20: 15 mL via OROMUCOSAL

## 2020-10-20 MED ORDER — ZOLPIDEM TARTRATE 5 MG PO TABS: 5.0000 mg | ORAL_TABLET | Freq: Every evening | ORAL | Status: DC | PRN

## 2020-10-20 SURGICAL SUPPLY — 53 items
ARTISURF 10M PLY R 6-9CD KNEE (Knees) ×2 IMPLANT
AUG FEM CMT STD PS 7 RT (Joint) ×2 IMPLANT
AUGMENT FEM CMT STD PS 7 RT (Joint) ×1 IMPLANT
BAG ZIPLOCK 12X15 (MISCELLANEOUS) ×2 IMPLANT
BLADE SAGITTAL 13X1.27X60 (BLADE) ×2 IMPLANT
BLADE SAW SGTL 83.5X18.5 (BLADE) ×2 IMPLANT
BLADE SURG 15 STRL LF DISP TIS (BLADE) ×1 IMPLANT
BLADE SURG 15 STRL SS (BLADE) ×2
BLADE SURG SZ10 CARB STEEL (BLADE) ×4 IMPLANT
BNDG ELASTIC 6X5.8 VLCR STR LF (GAUZE/BANDAGES/DRESSINGS) ×2 IMPLANT
BOWL SMART MIX CTS (DISPOSABLE) ×2 IMPLANT
CEMENT BONE SIMPLEX SPEEDSET (Cement) ×4 IMPLANT
COVER SURGICAL LIGHT HANDLE (MISCELLANEOUS) ×2 IMPLANT
COVER WAND RF STERILE (DRAPES) IMPLANT
CUFF TOURN SGL QUICK 34 (TOURNIQUET CUFF) ×2
CUFF TRNQT CYL 34X4.125X (TOURNIQUET CUFF) ×1 IMPLANT
DECANTER SPIKE VIAL GLASS SM (MISCELLANEOUS) ×4 IMPLANT
DRAPE INCISE IOBAN 66X45 STRL (DRAPES) ×4 IMPLANT
DRAPE U-SHAPE 47X51 STRL (DRAPES) ×2 IMPLANT
DRSG AQUACEL AG ADV 3.5X10 (GAUZE/BANDAGES/DRESSINGS) ×2 IMPLANT
DURAPREP 26ML APPLICATOR (WOUND CARE) ×4 IMPLANT
ELECT REM PT RETURN 15FT ADLT (MISCELLANEOUS) ×2 IMPLANT
GLOVE SURG ENC TEXT LTX SZ7.5 (GLOVE) ×2 IMPLANT
GLOVE SURG ORTHO LTX SZ8 (GLOVE) ×6 IMPLANT
GLOVE SURG UNDER POLY LF SZ7.5 (GLOVE) ×2 IMPLANT
GLOVE SURG UNDER POLY LF SZ8.5 (GLOVE) ×4 IMPLANT
GOWN STRL REUS W/ TWL XL LVL3 (GOWN DISPOSABLE) ×2 IMPLANT
GOWN STRL REUS W/TWL XL LVL3 (GOWN DISPOSABLE) ×4
HANDPIECE INTERPULSE COAX TIP (DISPOSABLE) ×2
HOLDER FOLEY CATH W/STRAP (MISCELLANEOUS) ×2 IMPLANT
HOOD PEEL AWAY FLYTE STAYCOOL (MISCELLANEOUS) ×6 IMPLANT
KIT TURNOVER KIT A (KITS) ×2 IMPLANT
MANIFOLD NEPTUNE II (INSTRUMENTS) ×2 IMPLANT
NEEDLE HYPO 22GX1.5 SAFETY (NEEDLE) ×2 IMPLANT
NS IRRIG 1000ML POUR BTL (IV SOLUTION) ×2 IMPLANT
PACK TOTAL KNEE CUSTOM (KITS) ×2 IMPLANT
PENCIL SMOKE EVACUATOR (MISCELLANEOUS) ×2 IMPLANT
PROTECTOR NERVE ULNAR (MISCELLANEOUS) ×2 IMPLANT
SET HNDPC FAN SPRY TIP SCT (DISPOSABLE) ×1 IMPLANT
STEM POLY PAT PLY 35M KNEE (Knees) ×2 IMPLANT
STEM TIBIA 5 DEG SZ D R KNEE (Knees) ×1 IMPLANT
STRIP CLOSURE SKIN 1/2X4 (GAUZE/BANDAGES/DRESSINGS) ×2 IMPLANT
SUT BONE WAX W31G (SUTURE) ×2 IMPLANT
SUT MNCRL AB 3-0 PS2 18 (SUTURE) ×2 IMPLANT
SUT STRATAFIX 0 PDS 27 VIOLET (SUTURE) ×2
SUT STRATAFIX PDS+ 0 24IN (SUTURE) ×2 IMPLANT
SUT VIC AB 1 CT1 36 (SUTURE) ×2 IMPLANT
SUTURE STRATFX 0 PDS 27 VIOLET (SUTURE) ×1 IMPLANT
SYR 30ML LL (SYRINGE) ×4 IMPLANT
TIBIA STEM 5 DEG SZ D R KNEE (Knees) ×2 IMPLANT
TRAY FOLEY MTR SLVR 16FR STAT (SET/KITS/TRAYS/PACK) ×2 IMPLANT
WATER STERILE IRR 1000ML POUR (IV SOLUTION) ×4 IMPLANT
WRAP KNEE MAXI GEL POST OP (GAUZE/BANDAGES/DRESSINGS) ×2 IMPLANT

## 2020-10-20 NOTE — Op Note (Signed)
TOTAL KNEE REPLACEMENT OPERATIVE NOTE:  10/20/2020  11:19 AM  PATIENT:  Christie Atkinson  65 y.o. female  PRE-OPERATIVE DIAGNOSIS:  Osteoarthritis of right knee M17.11  POST-OPERATIVE DIAGNOSIS:  Osteoarthritis of right knee M17.11  PROCEDURE:  Procedure(s): TOTAL KNEE ARTHROPLASTY  SURGEON:  Surgeon(s): Vickey Huger, MD  PHYSICIAN ASSISTANT: Carlyon Shadow, PA-C   ANESTHESIA:   spinal  SPECIMEN: None  COUNTS:  Correct  TOURNIQUET:   Total Tourniquet Time Documented: Thigh (Right) - 41 minutes Total: Thigh (Right) - 41 minutes   DICTATION:  Indication for procedure:    The patient is a 65 y.o. female who has failed conservative treatment for Osteoarthritis of right knee M17.11.  Informed consent was obtained prior to anesthesia. The risks versus benefits of the operation were explain and in a way the patient can, and did, understand.    Description of procedure:     The patient was taken to the operating room and placed under anesthesia.  The patient was positioned in the usual fashion taking care that all body parts were adequately padded and/or protected.  A tourniquet was applied and the leg prepped and draped in the usual sterile fashion.  The extremity was exsanguinated with the esmarch and tourniquet inflated to 350 mmHg.  Pre-operative range of motion was normal.    A midline incision approximately 6-7 inches long was made with a #10 blade.  A new blade was used to make a parapatellar arthrotomy going 2-3 cm into the quadriceps tendon, over the patella, and alongside the medial aspect of the patellar tendon.  A synovectomy was then performed with the #10 blade and forceps. I then elevated the deep MCL off the medial tibial metaphysis subperiosteally around to the semimembranosus attachment.    I everted the patella and used calipers to measure patellar thickness.  I used the reamer to ream down to appropriate thickness to recreate the native thickness.  I then  removed excess bone with the rongeur and sagittal saw.  I used the appropriately sized template and drilled the three lug holes.  I then put the trial in place and measured the thickness with the calipers to ensure recreation of the native thickness.  The trial was then removed and the patella subluxed and the knee brought into flexion.  A homan retractor was place to retract and protect the patella and lateral structures.  A Z-retractor was place medially to protect the medial structures.  The extra-medullary alignment system was used to make cut the tibial articular surface perpendicular to the anamotic axis of the tibia and in 3 degrees of posterior slope.  The cut surface and alignment jig was removed.  I then used the intramedullary alignment guide to make a  valgus cut on the distal femur.  I then marked out the epicondylar axis on the distal femur.  I then used the anterior referencing sizer and measured the femur to be a size 7.  The 4-In-1 cutting block was screwed into place in external rotation matching the posterior condylar angle, making our cuts perpendicular to the epicondylar axis.  Anterior, posterior and chamfer cuts were made with the sagittal saw.  The cutting block and cut pieces were removed.  A lamina spreader was placed in 90 degrees of flexion.  The ACL, PCL, menisci, and posterior condylar osteophytes were removed.  A 10 mm spacer blocked was found to offer good flexion and extension gap balance after minimal in degree releasing.   The scoop retractor was then  placed and the femoral finishing block was pinned in place.  The small sagittal saw was used as well as the lug drill to finish the femur.  The block and cut surfaces were removed and the medullary canal hole filled with autograft bone from the cut pieces.  The tibia was delivered forward in deep flexion and external rotation.  A size D tray was selected and pinned into place centered on the medial 1/3 of the tibial tubercle.   The reamer and keel was used to prepare the tibia through the tray.    I then trialed with the size 7 femur, size D tibia, a 10 mm insert and the 35 patella.  I had excellent flexion/extension gap balance, excellent patella tracking.  Flexion was full and beyond 120 degrees; extension was zero.  These components were chosen and the staff opened them to me on the back table while the knee was lavaged copiously and the cement mixed.  The soft tissue was infiltrated with 60cc of exparel 1.3% through a 21 gauge needle.  I cemented in the components and removed all excess cement.  The polyethylene tibial component was snapped into place and the knee placed in extension while cement was hardening.  The capsule was infilltrated with a 60cc exparel/marcaine/saline mixture.   Once the cement was hard, the tourniquet was let down.  Hemostasis was obtained.  The arthrotomy was closed using a #1 stratofix running suture.  The deep soft tissues were closed with #0 vicryls and the subcuticular layer closed with #2-0 vicryl.  The skin was reapproximated and closed with 3.0 Monocryl.  The wound was covered with steristrips, aquacel dressing, and a TED stocking.   The patient was then awakened, extubated, and taken to the recovery room in stable condition.  BLOOD LOSS:  539JQ COMPLICATIONS:  None.  PLAN OF CARE: Admit for overnight observation  PATIENT DISPOSITION:  PACU - hemodynamically stable.    Please fax a copy of this op note to my office at [phone] (please only include page 1 and 2 of the Case Information op note)

## 2020-10-20 NOTE — Anesthesia Postprocedure Evaluation (Signed)
Anesthesia Post Note  Patient: Christie Atkinson  Procedure(s) Performed: TOTAL KNEE ARTHROPLASTY (Right Knee)     Patient location during evaluation: PACU Anesthesia Type: Spinal Level of consciousness: awake Pain management: pain level controlled Vital Signs Assessment: post-procedure vital signs reviewed and stable Cardiovascular status: stable Postop Assessment: no headache, no backache, spinal receding and patient able to bend at knees Anesthetic complications: no   No complications documented.  Last Vitals:  Vitals:   10/20/20 1145 10/20/20 1200  BP: 112/61 109/66  Pulse: 64 63  Resp: 14 18  Temp:    SpO2: 94% 95%    Last Pain:  Vitals:   10/20/20 1200  TempSrc:   PainSc: 0-No pain                 John F Salome Arnt

## 2020-10-20 NOTE — Evaluation (Signed)
Physical Therapy Evaluation Patient Details Name: Christie Atkinson MRN: [id number] DOB: 19-Aug-1955 Today's Date: 10/20/2020   History of Present Illness  Patient is 65 y.o. female s/p Rt TKA on 10/20/20 with PMH significant for OA, Afib, fibromyalgia, DM, back pain, asthma.  Clinical Impression  Christie Atkinson is a 65 y.o. female POD 0 s/p Rt TKA. Patient reports indpeendence with mobility at baseline. Patient is now limited by functional impairments (see PT problem list below) and requires min-mod assist for transfers with RW. Patient required manual facilitation of Rt knee to prevent buckling due to poor use of UE's on RW to prevent. Pt able to step bed>chair with mod assist and cues. Patient instructed in exercise to facilitate and circulation to manage edema and reduce risk of DVT. Patient will benefit from continued skilled PT interventions to address impairments and progress towards PLOF. Acute PT will follow to progress mobility and stair training in preparation for safe discharge home.     Follow Up Recommendations Follow surgeon's recommendation for DC plan and follow-up therapies;Outpatient PT    Equipment Recommendations  Rolling walker with 5" wheels    Recommendations for Other Services       Precautions / Restrictions Precautions Precautions: Fall Restrictions Weight Bearing Restrictions: No RLE Weight Bearing: Weight bearing as tolerated      Mobility  Bed Mobility Overal bed mobility: Needs Assistance Bed Mobility: Supine to Sit     Supine to sit: HOB elevated;Min guard     General bed mobility comments: cues to initiate moving to EOB, pt taking extra time and using bed rail    Transfers Overall transfer level: Needs assistance Equipment used: Rolling walker (2 wheeled) Transfers: Sit to/from [facility] Sit to Stand: Min assist Stand pivot transfers: Mod assist       General transfer comment: cues for technique/hand placement with RW,  assist for power up and to steady with rise. pt with poor use of UE's to prevent buckling on Rt knee and manual blocking required to prevent buckling with mod assist to manage walker and step to recliner.  Ambulation/Gait                Stairs            Wheelchair Mobility    Modified Rankin (Stroke Patients Only)       Balance Overall balance assessment: Needs assistance Sitting-balance support: Feet supported Sitting balance-Leahy Scale: Good     Standing balance support: During functional activity;Bilateral upper extremity supported Standing balance-Leahy Scale: Poor                               Pertinent Vitals/Pain Pain Assessment: 0-10 Pain Location: Rt knee Pain Descriptors / Indicators: Aching;Discomfort    Home Living Family/patient expects to be discharged to:: Private residence Living Arrangements: Spouse/significant other;Non-relatives/Friends Available Help at Discharge: Family Type of Home: House Home Access: Stairs to enter Entrance Stairs-Rails: Right;Left;Can reach both Technical brewer of Steps: 4+1 Home Layout: One level Home Equipment: Environmental consultant - 4 wheels;Cane - single point;Bedside commode;Shower seat Additional Comments: pt's significant other and friend are living with her and can assist as she recovers.    Prior Function Level of Independence: Independent with assistive device(s)         Comments: uses rollator due to back pain     Hand Dominance   Dominant Hand: Right    Extremity/Trunk Assessment   Upper Extremity  Assessment Upper Extremity Assessment: Overall WFL for tasks assessed    Lower Extremity Assessment Lower Extremity Assessment: RLE deficits/detail RLE Deficits / Details: good quad activation, no extensor lag with SLR, pt with poor use of UE's and Rt knee buckling in standing. RLE Sensation: WNL RLE Coordination: WNL    Cervical / Trunk Assessment Cervical / Trunk Assessment: Normal   Communication   Communication: No difficulties  Cognition Arousal/Alertness: Awake/alert Behavior During Therapy: WFL for tasks assessed/performed Overall Cognitive Status: Within Functional Limits for tasks assessed                                        General Comments      Exercises Total Joint Exercises Ankle Circles/Pumps: AROM;Both;20 reps;Seated   Assessment/Plan    PT Assessment Patient needs continued PT services  PT Problem List Decreased strength;Decreased range of motion;Decreased activity tolerance;Decreased mobility;Decreased balance;Decreased knowledge of use of DME;Decreased safety awareness;Decreased knowledge of precautions;Pain       PT Treatment Interventions DME instruction;Gait training;Stair training;Functional mobility training;Therapeutic activities;Therapeutic exercise;Balance training;Patient/family education    PT Goals (Current goals can be found in the Care Plan section)  Acute Rehab PT Goals Patient Stated Goal: recover independence PT Goal Formulation: With patient Time For Goal Achievement: 10/27/20 Potential to Achieve Goals: Good    Frequency 7X/week   Barriers to discharge        Co-evaluation               AM-PAC PT "6 Clicks" Mobility  Outcome Measure Help needed turning from your back to your side while in a flat bed without using bedrails?: None Help needed moving from lying on your back to sitting on the side of a flat bed without using bedrails?: A Little Help needed moving to and from a bed to a chair (including a wheelchair)?: A Lot Help needed standing up from a chair using your arms (e.g., wheelchair or bedside chair)?: A Little Help needed to walk in hospital room?: A Lot Help needed climbing 3-5 steps with a railing? : A Lot 6 Click Score: 16    End of Session Equipment Utilized During Treatment: Gait belt Activity Tolerance: Patient tolerated treatment well Patient left: in chair;with call  bell/phone within reach;with chair alarm set;with nursing/sitter in room Nurse Communication: Mobility status PT Visit Diagnosis: Muscle weakness (generalized) (M62.81);Difficulty in walking, not elsewhere classified (R26.2)    Time: 7412-8786 PT Time Calculation (min) (ACUTE ONLY): 16 min   Charges:   PT Evaluation $PT Eval Low Complexity: 1 Low          Verner Mould, DPT Acute Rehabilitation Services Office [phone] Pager [phone]    Jacques Navy 10/20/2020, 4:03 PM

## 2020-10-20 NOTE — Transfer of Care (Signed)
Immediate Anesthesia Transfer of Care Note  Patient: Christie Atkinson  Procedure(s) Performed: TOTAL KNEE ARTHROPLASTY (Right Knee)  Patient Location: PACU  Anesthesia Type:Spinal  Level of Consciousness: drowsy and patient cooperative  Airway & Oxygen Therapy: Patient Spontanous Breathing and Patient connected to face mask oxygen  Post-op Assessment: Report given to RN and Post -op Vital signs reviewed and stable  Post vital signs: Reviewed and stable  Last Vitals:  Vitals Value Taken Time  BP 93/53 10/20/20 1111  Temp    Pulse 62 10/20/20 1112  Resp 11 10/20/20 1112  SpO2 98 % 10/20/20 1112  Vitals shown include unvalidated device data.  Last Pain:  Vitals:   10/20/20 0722  TempSrc: Oral  PainSc:          Complications: No complications documented.

## 2020-10-20 NOTE — Anesthesia Procedure Notes (Signed)
Anesthesia Regional Block: Adductor canal block   Pre-Anesthetic Checklist: ,, timeout performed, Correct Patient, Correct Site, Correct Laterality, Correct Procedure, Correct Position, site marked, Risks and benefits discussed,  Surgical consent,  Pre-op evaluation,  At surgeon's request and post-op pain management  Laterality: Lower  Prep: chloraprep       Needles:  Injection technique: Single-shot  Needle Type: Echogenic Stimulator Needle     Needle Length: 10cm  Needle Gauge: 20   Needle insertion depth: 3.5 cm   Additional Needles:   Procedures:,,,, ultrasound used (permanent image in chart),,,,  Narrative:  Start time: 10/20/2020 9:10 AM End time: 10/20/2020 9:20 AM Injection made incrementally with aspirations every 5 mL. Anesthesiologist: Lyn Hollingshead, MD

## 2020-10-20 NOTE — H&P (Signed)
KADANCE MCCUISTION MRN:  [id number] DOB/SEX:  [birth year]/female  CHIEF COMPLAINT:  Painful right Knee  HISTORY: Patient is a 65 y.o. female presented with a history of pain in the right knee. Onset of symptoms was gradual starting  months ago with gradually worsening course since that time. Patient has been treated conservatively with over-the-counter NSAIDs and activity modification. Patient currently rates pain in the knee at 10 out of 10 with activity. There is pain at night.  PAST MEDICAL HISTORY: Patient Active Problem List   Diagnosis Date Noted  . Persistent atrial fibrillation ([place]) 03/09/2018  . Chronic diastolic CHF (congestive heart failure) ([place]) 02/11/2018  . Visit for monitoring Tikosyn therapy 02/07/2018  . Abnormal nuclear stress test 08/08/2017  . Other persistent atrial fibrillation ([place])   . Morbid obesity ([place]) 06/29/2017  . Asthma 06/29/2017  . Fibromyalgia 06/29/2017  . Congestive heart failure ([place])   . Preoperative cardiovascular examination 02/23/2015  . Atrial fibrillation with RVR ([place]) 02/23/2015  . Elevated blood pressure reading without diagnosis of hypertension 02/23/2015  . Abdominal pain   . Calculus of gallbladder with biliary obstruction but without cholecystitis   . Symptomatic cholelithiasis   . Cholelithiasis 02/22/2015   Past Medical History:  Diagnosis Date  . Asthma   . Atrial fibrillation ([place])   . Back pain   . Diabetes mellitus without complication ([place])   . Diastolic dysfunction   . Dysrhythmia    a fib  . Fibromyalgia    neuropathy feet  . History of hiatal hernia   . History of kidney stones   . Hypertension   . Knee pain   . Osteoarthritis   . Persistent atrial fibrillation ([place])   . Plantar fasciitis   . Pneumonia   . Sleep apnea    wears oral appliance  . Visit for monitoring Tikosyn therapy 02/07/2018   Past Surgical History:  Procedure Laterality Date  . ABDOMINAL HYSTERECTOMY    . ABLATION OF DYSRHYTHMIC FOCUS   03/09/2018  . APPENDECTOMY    . ATRIAL FIBRILLATION ABLATION N/A 03/09/2018   Procedure: ATRIAL FIBRILLATION ABLATION;  Surgeon: [name], MD;  Location: [facility];  Service: Cardiovascular;  Laterality: N/A;  . CARDIOVERSION N/A 07/04/2017   Procedure: CARDIOVERSION;  Surgeon: [name], MD;  [facility] ENDOSCOPY;  Service: Cardiovascular;  Laterality: N/A;  . CARDIOVERSION N/A 09/06/2017   Procedure: CARDIOVERSION;  Surgeon: [name], MD;  Location: [facility] ENDOSCOPY;  Service: Cardiovascular;  Laterality: N/A;  . CHOLECYSTECTOMY N/A 02/24/2015   Procedure: LAPAROSCOPIC CHOLECYSTECTOMY;  Surgeon: [name], MD;  Location: [facility];  Service: General;  Laterality: N/A;  . EXCISION HAGLUND'S DEFORMITY WITH ACHILLES TENDON REPAIR Left 12/21/2018   Procedure: Left Achilles Tendon Debridement and Reconstruction; Excision of Haglund Deformity;  Surgeon: [name], MD;  Location: [place];  Service: Orthopedics;  Laterality: Left;  [name] GASTROCNEMIUS RECESSION Left 12/21/2018   Procedure: Left Gastroc Recession;  Surgeon: [name], MD;  Location: [place];  Service: Orthopedics;  Laterality: Left;  . RIGHT/LEFT HEART CATH AND CORONARY ANGIOGRAPHY N/A 08/09/2017   Procedure: RIGHT/LEFT HEART CATH AND CORONARY ANGIOGRAPHY;  Surgeon: [name], MD;  Location: [facility];  Service: Cardiovascular;  Laterality: N/A;  . TEE WITHOUT CARDIOVERSION N/A 07/04/2017   Procedure: TRANSESOPHAGEAL ECHOCARDIOGRAM (TEE);  Surgeon: [name], MD;  Location: [facility] ENDOSCOPY;  Service: Cardiovascular;  Laterality: N/A;  . TEE WITHOUT CARDIOVERSION N/A 03/09/2018   Procedure: TRANSESOPHAGEAL ECHOCARDIOGRAM (  TEE);  Surgeon: [name], MD;  Location: [facility] ENDOSCOPY;  Service: Cardiovascular;  Laterality: N/A;  . VAGINAL DELIVERY     x4     MEDICATIONS:   Medications Prior to Admission  Medication Sig Dispense Refill Last Dose  . acidophilus  (RISAQUAD) CAPS capsule Take 1 capsule by mouth daily.     [name] albuterol (VENTOLIN HFA) 108 (90 Base) MCG/ACT inhaler Inhale 1 puff into the lungs every 6 (six) hours as needed for wheezing or shortness of breath.     [name] apixaban (ELIQUIS) 5 MG TABS tablet Take 1 tablet (5 mg total) by mouth 2 (two) times daily. 180 tablet 3   . Coenzyme Q10 (CO Q10 PO) Take 1 capsule by mouth daily.     [name] diltiazem (CARDIZEM CD) 120 MG 24 hr capsule Take 2 tablets in the AM and 1 tablet in the PM (Patient taking differently: Take 120-240 mg by mouth See admin instructions. Take 240 mg in the morning and 120 mg in the evening) 270 capsule 2   . dofetilide (TIKOSYN) 125 MCG capsule Take 1 capsule (125 mcg total) by mouth 2 (two) times daily. 180 capsule 1   . Fluticasone-Salmeterol (ADVAIR) 100-50 MCG/DOSE AEPB Inhale 1 puff into the lungs in the morning and at bedtime.     . furosemide (LASIX) 20 MG tablet Take 20 mg by mouth 2 (two) times daily.      [name] lidocaine (LIDODERM) 5 % Place 1 patch onto the skin daily. Remove & Discard patch within 12 hours or as directed by MD     . liraglutide (VICTOZA) 18 MG/3ML SOPN Inject 1.8 mg into the skin every evening.     . Magnesium Gluconate 500 (27 Mg) MG TABS Take 1 tablet (500 mg total) by mouth daily. (Patient taking differently: Take 1 tablet by mouth at bedtime.) 30 tablet 0   . metFORMIN (GLUCOPHAGE) 500 MG tablet Take 1 tablet (500 mg total) by mouth 2 (two) times daily with a meal. (Patient taking differently: Take 500 mg by mouth 2 (two) times daily as needed (carb intake).) 180 tablet 0   . metoprolol succinate (TOPROL-XL) 50 MG 24 hr tablet Take 1 tablet (50 mg total) by mouth as needed. Take with or immediately following a meal.     . Multiple Vitamins-Minerals (WOMENS MULTIVITAMIN) TABS Take 1 tablet by mouth daily. 90 tablet 0   . potassium chloride SA (KLOR-CON) 20 MEQ tablet Take 1 tablet (20 mEq total) by mouth daily. Take an extra 1 tablet by mouth on days when  you take the extra fluid pill (furosemide/Lasix). 120 tablet 2   . vitamin E 180 MG (400 UNITS) capsule Take 400 Units by mouth daily.       ALLERGIES:   Allergies  Allergen Reactions  . Nickel Hives and Itching  . Elemental Sulfur Hives  . Hydrocodone-Acetaminophen Nausea And Vomiting  . Other Other (See Comments)    Polyester - including hospital gowns and sheets - allergic contact dermatitis    REVIEW OF SYSTEMS:  A comprehensive review of systems was negative except for: Musculoskeletal: positive for arthralgias   FAMILY HISTORY:   Family History  Problem Relation Age of Onset  . Heart disease Mother   . Hypertension Mother   . Hyperlipidemia Mother   . Diabetes Mother   . Sudden death Mother   . Thyroid disease Mother   . Obesity Mother   . Heart disease Father   . Heart attack Father   .  Hypertension Father   . Hyperlipidemia Father   . Diabetes Father   . Allergic rhinitis Son   . Eczema Son   . Allergic rhinitis Daughter   . Eczema Daughter   . Angioedema Neg Hx   . Asthma Neg Hx   . Immunodeficiency Neg Hx     SOCIAL HISTORY:   Social History   Tobacco Use  . Smoking status: Passive Smoke Exposure - Never Smoker  . Smokeless tobacco: Never Used  Substance Use Topics  . Alcohol use: Yes    Comment: little     EXAMINATION:  Vital signs in last 24 hours:    There were no vitals taken for this visit.  General Appearance:    Alert, cooperative, no distress, appears stated age  Head:    Normocephalic, without obvious abnormality, atraumatic  Eyes:    PERRL, conjunctiva/corneas clear, EOM's intact, fundi    benign, both eyes  Ears:    Normal TM's and external ear canals, both ears  Nose:   Nares normal, septum midline, mucosa normal, no drainage    or sinus tenderness  Throat:   Lips, mucosa, and tongue normal; teeth and gums normal  Neck:   Supple, symmetrical, trachea midline, no adenopathy;    thyroid:  no enlargement/tenderness/nodules; no  carotid   bruit or JVD  Back:     Symmetric, no curvature, ROM normal, no CVA tenderness  Lungs:     Clear to auscultation bilaterally, respirations unlabored  Chest Wall:    No tenderness or deformity   Heart:    Regular rate and rhythm, S1 and S2 normal, no murmur, rub   or gallop  Breast Exam:    No tenderness, masses, or nipple abnormality  Abdomen:     Soft, non-tender, bowel sounds active all four quadrants,    no masses, no organomegaly  Genitalia:    Normal female without lesion, discharge or tenderness  Rectal:    Normal tone, no masses or tenderness;   guaiac negative stool  Extremities:   Extremities normal, atraumatic, no cyanosis or edema  Pulses:   2+ and symmetric all extremities  Skin:   Skin color, texture, turgor normal, no rashes or lesions  Lymph nodes:   Cervical, supraclavicular, and axillary nodes normal  Neurologic:   CNII-XII intact, normal strength, sensation and reflexes    throughout    Musculoskeletal:  ROM 0-120, Ligaments intact,  Imaging Review Plain radiographs demonstrate severe degenerative joint disease of the right knee. The overall alignment is neutral. The bone quality appears to be good for age and reported activity level.  Assessment/Plan: Primary osteoarthritis, right knee   The patient history, physical examination and imaging studies are consistent with advanced degenerative joint disease of the right knee. The patient has failed conservative treatment.  The clearance notes were reviewed.  After discussion with the patient it was felt that Total Knee Replacement was indicated. The procedure,  risks, and benefits of total knee arthroplasty were presented and reviewed. The risks including but not limited to aseptic loosening, infection, blood clots, vascular injury, stiffness, patella tracking problems complications among others were discussed. The patient acknowledged the explanation, agreed to proceed with the plan.  Preoperative templating of  the joint replacement has been completed, documented, and submitted to the Operating Room personnel in order to optimize intra-operative equipment management.    Patient's anticipated LOS is less than 2 midnights, meeting these requirements: - Lives within 1 hour of care - Has a competent adult  at home to recover with post-op recover - NO history of  - Chronic pain requiring opiods  - Diabetes  - Coronary Artery Disease  - Heart failure  - Heart attack  - Stroke  - DVT/VTE  - Cardiac arrhythmia  - Respiratory Failure/COPD  - Renal failure  - Anemia  - Advanced Liver disease       Donia Ast 10/20/2020, 6:59 AM

## 2020-10-20 NOTE — Progress Notes (Signed)
Assisted Dr. Hatchett with right, ultrasound guided, adductor canal block. Side rails up, monitors on throughout procedure. See vital signs in flow sheet. Tolerated Procedure well.  

## 2020-10-20 NOTE — Progress Notes (Signed)
Orthopedic Tech Progress Note Patient Details:  Christie Atkinson [birth year] [id number]  CPM Right Knee CPM Right Knee: On Right Knee Flexion (Degrees): 90 Right Knee Extension (Degrees): 0 Additional Comments: foot roll  Post Interventions Patient Tolerated: Well Instructions Provided: Care of device  [name] 10/20/2020, 11:52 AM

## 2020-10-20 NOTE — Anesthesia Procedure Notes (Signed)
Spinal  Patient location during procedure: OR Start time: 10/20/2020 9:36 AM End time: 10/20/2020 9:39 AM Reason for block: surgical anesthesia Staffing Performed: anesthesiologist  Anesthesiologist: Lyn Hollingshead, MD Preanesthetic Checklist Completed: patient identified, IV checked, site marked, risks and benefits discussed, surgical consent, monitors and equipment checked, pre-op evaluation and timeout performed Spinal Block Patient position: sitting Prep: DuraPrep and site prepped and draped Patient monitoring: continuous pulse ox and blood pressure Approach: midline Location: L3-4 Injection technique: single-shot Needle Needle type: Pencan  Needle gauge: 24 G Needle length: 10 cm Needle insertion depth: 7 cm Assessment Sensory level: T8 Events: CSF return and second provider

## 2020-10-21 ENCOUNTER — Other Ambulatory Visit: Payer: Self-pay

## 2020-10-21 DIAGNOSIS — M1711 Unilateral primary osteoarthritis, right knee: Secondary | ICD-10-CM | POA: Diagnosis not present

## 2020-10-21 DIAGNOSIS — J45909 Unspecified asthma, uncomplicated: Secondary | ICD-10-CM | POA: Diagnosis not present

## 2020-10-21 DIAGNOSIS — I5032 Chronic diastolic (congestive) heart failure: Secondary | ICD-10-CM | POA: Diagnosis not present

## 2020-10-21 DIAGNOSIS — I110 Hypertensive heart disease with heart failure: Secondary | ICD-10-CM | POA: Diagnosis not present

## 2020-10-21 DIAGNOSIS — I4819 Other persistent atrial fibrillation: Secondary | ICD-10-CM | POA: Diagnosis not present

## 2020-10-21 DIAGNOSIS — E119 Type 2 diabetes mellitus without complications: Secondary | ICD-10-CM | POA: Diagnosis not present

## 2020-10-21 LAB — BASIC METABOLIC PANEL
Anion gap: 8 (ref 5–15)
BUN: 16 mg/dL (ref 8–23)
CO2: 23 mmol/L (ref 22–32)
Calcium: 8.9 mg/dL (ref 8.9–10.3)
Chloride: 107 mmol/L (ref 98–111)
Creatinine, Ser: 0.54 mg/dL (ref 0.44–1.00)
GFR, Estimated: >60 mL/min (ref >60)
Glucose, Bld: 178 mg/dL — ABNORMAL HIGH (ref 70–99)
Potassium: 4.1 mmol/L (ref 3.5–5.1)
Sodium: 138 mmol/L (ref 135–145)

## 2020-10-21 LAB — CBC
HCT: 34.7 % — ABNORMAL LOW (ref 36.0–46.0)
Hemoglobin: 11.4 g/dL — ABNORMAL LOW (ref 12.0–15.0)
MCH: 31 pg (ref 26.0–34.0)
MCHC: 32.9 g/dL (ref 30.0–36.0)
MCV: 94.3 fL (ref 80.0–100.0)
Platelets: 216 10*3/uL (ref 150–400)
RBC: 3.68 MIL/uL — ABNORMAL LOW (ref 3.87–5.11)
RDW: 11.9 % (ref 11.5–15.5)
WBC: 18.5 10*3/uL — ABNORMAL HIGH (ref 4.0–10.5)
nRBC: 0 % (ref 0.0–0.2)

## 2020-10-21 MED ORDER — OXYCODONE HCL 5 MG PO TABS: 5.0000 mg | ORAL_TABLET | Freq: Four times a day (QID) | ORAL | 0 refills | Status: DC | PRN

## 2020-10-21 MED ORDER — METHOCARBAMOL 500 MG PO TABS: 500.0000 mg | ORAL_TABLET | Freq: Four times a day (QID) | ORAL | 0 refills | Status: DC | PRN

## 2020-10-21 MED ORDER — GABAPENTIN 300 MG PO CAPS
300.0000 mg | ORAL_CAPSULE | Freq: Three times a day (TID) | ORAL | 0 refills | Status: DC
Start: 2020-10-21 — End: 2021-02-03

## 2020-10-21 MED ORDER — ONDANSETRON 4 MG PO TBDP: 4.0000 mg | ORAL_TABLET | Freq: Three times a day (TID) | ORAL | 0 refills | Status: DC | PRN

## 2020-10-21 NOTE — Discharge Summary (Signed)
SPORTS MEDICINE & JOINT REPLACEMENT   Lara Mulch, MD   Carlyon Shadow, PA-C [place], [place], [place]  [address]                             [phone]  PATIENT ID: Christie Atkinson        MRN:  [id number]          DOB/AGE: [birth year] / 65 y.o.    DISCHARGE SUMMARY  ADMISSION DATE:    10/20/2020 DISCHARGE DATE:   10/21/2020   ADMISSION DIAGNOSIS: S/P total knee replacement [Z96.659]    DISCHARGE DIAGNOSIS:  Osteoarthritis of right knee M17.11    ADDITIONAL DIAGNOSIS: Active Problems:   S/P total knee replacement  Past Medical History:  Diagnosis Date  . Asthma   . Atrial fibrillation ([place])   . Back pain   . Diabetes mellitus without complication ([place])   . Diastolic dysfunction   . Dysrhythmia    a fib  . Fibromyalgia    neuropathy feet  . History of hiatal hernia   . History of kidney stones   . Hypertension   . Knee pain   . Osteoarthritis   . Persistent atrial fibrillation ([place])   . Plantar fasciitis   . Pneumonia   . Sleep apnea    wears oral appliance  . Visit for monitoring Tikosyn therapy 02/07/2018    PROCEDURE: Procedure(s): TOTAL KNEE ARTHROPLASTY on 10/20/2020  CONSULTS:    HISTORY:  See H&P in chart  HOSPITAL COURSE:  Christie Atkinson is a 65 y.o. admitted on 10/20/2020 and found to have a diagnosis of Osteoarthritis of right knee M17.11.  After appropriate laboratory studies were obtained  they were taken to the operating room on 10/20/2020 and underwent Procedure(s): TOTAL KNEE ARTHROPLASTY.   They were given perioperative antibiotics:  Anti-infectives (From admission, onward)   Start     Dose/Rate Route Frequency Ordered Stop   10/20/20 1600  ceFAZolin (ANCEF) IVPB 2g/100 mL premix        2 g 200 mL/hr over 30 Minutes Intravenous Every 6 hours 10/20/20 1305 10/21/20 0856   10/20/20 0645  ceFAZolin (ANCEF) IVPB 2g/100 mL premix        2 g 200 mL/hr over 30 Minutes Intravenous On call to O.R. 10/20/20 6578 10/20/20 0940     .  Patient given tranexamic acid IV or topical and exparel intra-operatively.  Tolerated the procedure well.    POD# 1: Vital signs were stable.  Patient denied Chest pain, shortness of breath, or calf pain.  Patient was started on Aspirin twice daily at 8am.  Consults to PT, OT, and care management were made.  The patient was weight bearing as tolerated.  CPM was placed on the operative leg 0-90 degrees for 6-8 hours a day. When out of the CPM, patient was placed in the foam block to achieve full extension. Incentive spirometry was taught.  Dressing was changed.       POD #2, Continued  PT for ambulation and exercise program.  IV saline locked.  O2 discontinued.    The remainder of the hospital course was dedicated to ambulation and strengthening.   The patient was discharged on 1 Day Post-Op in  Good condition.  Blood products given:none  DIAGNOSTIC STUDIES: Recent vital signs:  Patient Vitals for the past 24 hrs:  BP Temp Temp src Pulse Resp SpO2  10/21/20 0928 (!) 108/58 99 F (37.2  C) Oral 69 17 95 %  10/21/20 0640 118/61 98.4 F (36.9 C) Oral 72 15 95 %  10/21/20 0224 134/69 98.6 F (37 C) Oral 77 16 95 %  10/20/20 2304 133/75 98.8 F (37.1 C) Oral 85 17 95 %  10/20/20 2018 120/77 98.3 F (36.8 C) Oral 70 14 94 %  10/20/20 1547 116/66 98 F (36.7 C) Oral 64 19 94 %  10/20/20 1302 110/61 97.7 F (36.5 C) Oral 60 15 96 %       Recent laboratory studies: Recent Labs    10/16/20 1408 10/21/20 0333  WBC 6.9 18.5*  HGB 13.7 11.4*  HCT 41.9 34.7*  PLT 267 216   Recent Labs    10/16/20 1408 10/21/20 0333  NA 139 138  K 4.1 4.1  CL 104 107  CO2 28 23  BUN 21 16  CREATININE 1.08* 0.54  GLUCOSE 104* 178*  CALCIUM 9.3 8.9   Lab Results  Component Value Date   INR 1.07 08/09/2017     Recent Radiographic Studies :  No results found.  DISCHARGE INSTRUCTIONS:   DISCHARGE MEDICATIONS:     FOLLOW UP VISIT:    DISPOSITION: HOME VS. SNF  Dental  Antibiotics:  In most cases prophylactic antibiotics for Dental procdeures after total joint surgery are not necessary.  Exceptions are as follows:  1. History of prior total joint infection  2. Severely immunocompromised (Organ Transplant, cancer chemotherapy, Rheumatoid biologic meds such as [place])  3. Poorly controlled diabetes (A1C &gt; 8.0, blood glucose over 200)  If you have one of these conditions, contact your surgeon for an antibiotic prescription, prior to your dental procedure.   CONDITION:  Good   Donia Ast 10/21/2020, 12:37 PM

## 2020-10-21 NOTE — Plan of Care (Signed)
Plan of care reviewed and discussed with the patient. 

## 2020-10-21 NOTE — TOC Transition Note (Addendum)
Transition of Care [facility]) - CM/SW Discharge Note  Patient Details  Name: DAJAH FISCHMAN MRN: [id number] Date of Birth: 22-May-1956  [facility]) CM/SW Contact:  Sherie Don, LCSW Phone Number: 10/21/2020, 1:01 PM  Clinical Narrative: Patient is expected to discharge today after working with PT. CSW met with patient to review discharge plan. Patient received a rolling walker from [place]. Patient will discharge home with HHPT through [facility] for a few weeks and will then transition to OPPT through [facility].  Patient asked about a ride home to [place]. CSW discussed with patient that as her surgery was scheduled, someone will need to pick her up or she can try using [employer] or a taxi service. Patient has previously used [employer], but this was due to patient being COVID+ at the time of discharge. TOC signing off.  Final next level of care: [place] Barriers to Discharge: Barriers Resolved  Patient Goals and CMS Choice Patient states their goals for this hospitalization and ongoing recovery are:: Discharge home with HHPT through [place] CMS Medicare.gov Compare Post Acute Care list provided to:: Patient Choice offered to / list presented to : Patient  Discharge Plan and Services         DME Arranged: Walker rolling DME Agency: [employer] Representative spoke with at DME Agency: Pre-arranged in orthopedist's office HH Arranged: PT [place] Agency: Magazine features editor spoke with at [facility]: Pre-arranged in orthopedist's office  Readmission Risk Interventions No flowsheet data found.

## 2020-10-21 NOTE — Progress Notes (Signed)
Patient was discharged, she called back about 30 minutes later upset that her walker did not make it with her at discharge. The walker was tucked behind her door and was forgotten. She was irate and demanded [facility] send a transport service to bring her walker to her home in [place]. The secretary informed her that this is not something that [facility] can do. She informed her that either she can have someone come pick it up, or we could have social work take it off her hospital bill. She was unhappy with either option and continued to say [facility] must send a "transport service" to bring her the walker because gas is too expensive and they were not going to come back. We informed patient she is able to call back tomorrow if she wanted to speak with management.

## 2020-10-21 NOTE — Progress Notes (Signed)
SPORTS MEDICINE AND JOINT REPLACEMENT  Lara Mulch, MD    Carlyon Shadow, PA-C [place], [place], [place]  [address]                             [phone]   PROGRESS NOTE  Subjective:  negative for Chest Pain  negative for Shortness of Breath  negative for Nausea/Vomiting   negative for Calf Pain  negative for Bowel Movement   Tolerating Diet: yes         Patient reports pain as 3 on 0-10 scale.    Objective: Vital signs in last 24 hours:   Patient Vitals for the past 24 hrs:  BP Temp Temp src Pulse Resp SpO2  10/21/20 0928 (!) 108/58 99 F (37.2 C) Oral 69 17 95 %  10/21/20 0640 118/61 98.4 F (36.9 C) Oral 72 15 95 %  10/21/20 0224 134/69 98.6 F (37 C) Oral 77 16 95 %  10/20/20 2304 133/75 98.8 F (37.1 C) Oral 85 17 95 %  10/20/20 2018 120/77 98.3 F (36.8 C) Oral 70 14 94 %  10/20/20 1547 116/66 98 F (36.7 C) Oral 64 19 94 %  10/20/20 1302 110/61 97.7 F (36.5 C) Oral 60 15 96 %    @flow {1959:LAST@   Intake/Output from previous day:   05/16 0701 - 05/17 0700 In: 3597 [P.O.:960; I.V.:2237] Out: 4350 [Urine:4300]   Intake/Output this shift:   05/17 0701 - 05/17 1900 In: 120 [P.O.:120] Out: 100 [Urine:100]   Intake/Output      05/16 0701 05/17 0700 05/17 0701 05/18 0700   P.O. 960 120   I.V. (mL/kg) 2237 (22.9)    IV Piggyback 400    Total Intake(mL/kg) 3597 (36.9) 120 (1.2)   Urine (mL/kg/hr) 4300 (1.8) 100 (0.2)   Stool 0    Blood 50    Total Output 4350 100   Net -753 +20        Stool Occurrence 0 x       LABORATORY DATA: Recent Labs    10/16/20 1408 10/21/20 0333  WBC 6.9 18.5*  HGB 13.7 11.4*  HCT 41.9 34.7*  PLT 267 216   Recent Labs    10/16/20 1408 10/21/20 0333  NA 139 138  K 4.1 4.1  CL 104 107  CO2 28 23  BUN 21 16  CREATININE 1.08* 0.54  GLUCOSE 104* 178*  CALCIUM 9.3 8.9   Lab Results  Component Value Date   INR 1.07 08/09/2017    Examination:  General appearance: alert, cooperative and no  distress Extremities: extremities normal, atraumatic, no cyanosis or edema  Wound Exam: clean, dry, intact   Drainage:  None: wound tissue dry  Motor Exam: Quadriceps and Hamstrings Intact  Sensory Exam: Superficial Peroneal, Deep Peroneal and Tibial normal   Assessment:    1 Day Post-Op  Procedure(s) (LRB): TOTAL KNEE ARTHROPLASTY (Right)  ADDITIONAL DIAGNOSIS:  Active Problems:   S/P total knee replacement     Plan: Physical Therapy as ordered Weight Bearing as Tolerated (WBAT)  DVT Prophylaxis:  Aspirin  DISCHARGE PLAN: Home  Patient doing well and ready for D/C       Patient's anticipated LOS is less than 2 midnights, meeting these requirements: - Younger than 34 - Lives within 1 hour of care - Has a competent adult at home to recover with post-op recover - NO history of  - Chronic pain requiring opiods  -  Diabetes  - Coronary Artery Disease  - Heart failure  - Heart attack  - Stroke  - DVT/VTE  - Cardiac arrhythmia  - Respiratory Failure/COPD  - Renal failure  - Anemia  - Advanced Liver disease        Donia Ast 10/21/2020, 12:36 PM

## 2020-10-21 NOTE — Progress Notes (Signed)
Physical Therapy Treatment Patient Details Name: Christie Atkinson MRN: [id number] DOB: 12/10/1955 Today's Date: 10/21/2020    History of Present Illness Patient is 65 y.o. female s/p Rt TKA on 10/20/20 with PMH significant for OA, Afib, fibromyalgia, DM, back pain, asthma.    PT Comments    Pt assisted with performing LE exercises. Pt requested to use bathroom prior to ambulating in hallway however became dizzy and required recliner so provided BSC instead.  Pt will need to practice steps prior to d/c.    Follow Up Recommendations  Follow surgeon's recommendation for DC plan and follow-up therapies;Outpatient PT     Equipment Recommendations  Rolling walker with 5" wheels    Recommendations for Other Services       Precautions / Restrictions Precautions Precautions: Fall;Knee Restrictions RLE Weight Bearing: Weight bearing as tolerated    Mobility  Bed Mobility Overal bed mobility: Needs Assistance Bed Mobility: Supine to Sit     Supine to sit: HOB elevated;Min guard     General bed mobility comments: cues to initiate moving to EOB, pt taking extra time and using bed rail    Transfers Overall transfer level: Needs assistance Equipment used: Rolling walker (2 wheeled) Transfers: Sit to/from Stand Sit to Stand: Min assist Stand pivot transfers: Min assist       General transfer comment: verbal cues for UE and LE positioning, assist to rise and steady; performed stand pivot from [facility] to recliner  Ambulation/Gait Ambulation/Gait assistance: Min guard Gait Distance (Feet): 4 Feet Assistive device: Rolling walker (2 wheeled) Gait Pattern/deviations: Step-to pattern;Antalgic;Decreased stance time - right     General Gait Details: pt reported dizziness after a few steps and needed to sit down so provided recliner; pt determined to urinate so provided BSC and pt transferred to/from; BP after transferring was 140/65 mmHg   Stairs             Wheelchair  Mobility    Modified Rankin (Stroke Patients Only)       Balance                                            Cognition Arousal/Alertness: Awake/alert Behavior During Therapy: WFL for tasks assessed/performed Overall Cognitive Status: Within Functional Limits for tasks assessed                                        Exercises Total Joint Exercises Ankle Circles/Pumps: AROM;Both;10 reps Quad Sets: AROM;Both;10 reps Heel Slides: AAROM;Right;10 reps Hip ABduction/ADduction: AAROM;Right;10 reps Straight Leg Raises: AAROM;Right;10 reps    General Comments        Pertinent Vitals/Pain Pain Assessment: 0-10 Pain Score: 6  Pain Location: Rt knee Pain Descriptors / Indicators: Aching;Discomfort;Sore Pain Intervention(s): Repositioned;Monitored during session;Premedicated before session    Home Living                      Prior Function            PT Goals (current goals can now be found in the care plan section) Progress towards PT goals: Progressing toward goals    Frequency    7X/week      PT Plan Current plan remains appropriate    Co-evaluation  AM-PAC PT "6 Clicks" Mobility   Outcome Measure  Help needed turning from your back to your side while in a flat bed without using bedrails?: None Help needed moving from lying on your back to sitting on the side of a flat bed without using bedrails?: A Little Help needed moving to and from a bed to a chair (including a wheelchair)?: A Little Help needed standing up from a chair using your arms (e.g., wheelchair or bedside chair)?: A Little Help needed to walk in hospital room?: A Lot Help needed climbing 3-5 steps with a railing? : A Lot 6 Click Score: 17    End of Session Equipment Utilized During Treatment: Gait belt Activity Tolerance: Patient tolerated treatment well Patient left: in chair;with call bell/phone within reach;with chair alarm set    PT Visit Diagnosis: Muscle weakness (generalized) (M62.81);Difficulty in walking, not elsewhere classified (R26.2)     Time: 5681-2751 PT Time Calculation (min) (ACUTE ONLY): 35 min  Charges:  $Therapeutic Exercise: 8-22 mins $Therapeutic Activity: 8-22 mins                    Jannette Spanner PT, DPT Acute Rehabilitation Services Pager: [phone] Office: [phone]  York Ram E 10/21/2020, 1:14 PM

## 2020-10-21 NOTE — Progress Notes (Signed)
Physical Therapy Treatment Patient Details Name: Christie Atkinson MRN: [id number] DOB: Dec 01, 1955 Today's Date: 10/21/2020    History of Present Illness Patient is 65 y.o. female s/p Rt TKA on 10/20/20 with PMH significant for OA, Afib, fibromyalgia, DM, back pain, asthma.    PT Comments    Pt reports "doing too much this morning" however eager to go home today.  Pt pivoted to recliner and brought to stairs in recliner.  Pt performed steps and required a little assist through gait belt.  Pt's friend present and observed.  Pt feels she can d/c home today.  Pt encouraged to take home gait belt for safety at home.   Follow Up Recommendations  Follow surgeon's recommendation for DC plan and follow-up therapies;Outpatient PT     Equipment Recommendations  Rolling walker with 5" wheels    Recommendations for Other Services       Precautions / Restrictions Precautions Precautions: Fall;Knee Restrictions RLE Weight Bearing: Weight bearing as tolerated    Mobility  Bed Mobility Overal bed mobility: Needs Assistance Bed Mobility: Supine to Sit     Supine to sit: HOB elevated;Min guard     General bed mobility comments: utilized gait belt to self assist Right LE    Transfers Overall transfer level: Needs assistance Equipment used: Rolling walker (2 wheeled) Transfers: Sit to/from Stand Sit to Stand: Min guard Stand pivot transfers: Min guard       General transfer comment: verbal cues for UE and LE positioning  Ambulation/Gait Ambulation/Gait assistance: Min guard Gait Distance (Feet): 10 Feet Assistive device: Rolling walker (2 wheeled) Gait Pattern/deviations: Step-to pattern;Antalgic;Decreased stance time - right     General Gait Details: pt reports "doing too much this morning" so ambulated short distance to steps however taken to steps in recliner   Stairs Stairs: Yes Stairs assistance: Min assist Stair Management: Forwards;With crutches;One rail  Right Number of Stairs: 3 General stair comments: verbal cues for sequence and safety, provided some support through gait belt per pt preference, used crutch to ascend stairs however descended stairs with both hands on right rail   Wheelchair Mobility    Modified Rankin (Stroke Patients Only)       Balance                                            Cognition Arousal/Alertness: Awake/alert Behavior During Therapy: WFL for tasks assessed/performed Overall Cognitive Status: Within Functional Limits for tasks assessed                                        Exercises Total Joint Exercises Ankle Circles/Pumps: AROM;Both;10 reps Quad Sets: AROM;Both;10 reps Heel Slides: AAROM;Right;10 reps Hip ABduction/ADduction: AAROM;Right;10 reps Straight Leg Raises: AAROM;Right;10 reps    General Comments        Pertinent Vitals/Pain Pain Assessment: 0-10 Pain Score: 6  Pain Location: Rt knee Pain Descriptors / Indicators: Aching;Discomfort;Sore Pain Intervention(s): RN gave pain meds during session;Repositioned;Monitored during session    Home Living                      Prior Function            PT Goals (current goals can now be found in the care plan section) Progress towards PT  goals: Progressing toward goals    Frequency    7X/week      PT Plan Current plan remains appropriate    Co-evaluation              AM-PAC PT "6 Clicks" Mobility   Outcome Measure  Help needed turning from your back to your side while in a flat bed without using bedrails?: None Help needed moving from lying on your back to sitting on the side of a flat bed without using bedrails?: A Little Help needed moving to and from a bed to a chair (including a wheelchair)?: A Little Help needed standing up from a chair using your arms (e.g., wheelchair or bedside chair)?: A Little Help needed to walk in hospital room?: A Little Help needed climbing  3-5 steps with a railing? : A Lot 6 Click Score: 18    End of Session Equipment Utilized During Treatment: Gait belt Activity Tolerance: Patient tolerated treatment well Patient left: in chair;with call bell/phone within reach;with chair alarm set;with family/visitor present Nurse Communication: Mobility status PT Visit Diagnosis: Muscle weakness (generalized) (M62.81);Difficulty in walking, not elsewhere classified (R26.2)     Time: 5597-4163 PT Time Calculation (min) (ACUTE ONLY): 27 min  Charges:  $Gait Training: 23-37 mins                     Jannette Spanner PT, DPT Acute Rehabilitation Services Pager: [phone] Office: [phone]  Trena Platt 10/21/2020, 3:36 PM

## 2020-10-22 DIAGNOSIS — Z7901 Long term (current) use of anticoagulants: Secondary | ICD-10-CM | POA: Diagnosis not present

## 2020-10-22 DIAGNOSIS — N200 Calculus of kidney: Secondary | ICD-10-CM | POA: Diagnosis not present

## 2020-10-22 DIAGNOSIS — Z9049 Acquired absence of other specified parts of digestive tract: Secondary | ICD-10-CM | POA: Diagnosis not present

## 2020-10-22 DIAGNOSIS — E1140 Type 2 diabetes mellitus with diabetic neuropathy, unspecified: Secondary | ICD-10-CM | POA: Diagnosis not present

## 2020-10-22 DIAGNOSIS — J45909 Unspecified asthma, uncomplicated: Secondary | ICD-10-CM | POA: Diagnosis not present

## 2020-10-22 DIAGNOSIS — I4819 Other persistent atrial fibrillation: Secondary | ICD-10-CM | POA: Diagnosis not present

## 2020-10-22 DIAGNOSIS — Z96651 Presence of right artificial knee joint: Secondary | ICD-10-CM | POA: Diagnosis not present

## 2020-10-22 DIAGNOSIS — M797 Fibromyalgia: Secondary | ICD-10-CM | POA: Diagnosis not present

## 2020-10-22 DIAGNOSIS — Z6834 Body mass index (BMI) 34.0-34.9, adult: Secondary | ICD-10-CM | POA: Diagnosis not present

## 2020-10-22 DIAGNOSIS — M722 Plantar fascial fibromatosis: Secondary | ICD-10-CM | POA: Diagnosis not present

## 2020-10-22 DIAGNOSIS — Z471 Aftercare following joint replacement surgery: Secondary | ICD-10-CM | POA: Diagnosis not present

## 2020-10-22 DIAGNOSIS — G4730 Sleep apnea, unspecified: Secondary | ICD-10-CM | POA: Diagnosis not present

## 2020-10-22 DIAGNOSIS — I5032 Chronic diastolic (congestive) heart failure: Secondary | ICD-10-CM | POA: Diagnosis not present

## 2020-10-22 DIAGNOSIS — I110 Hypertensive heart disease with heart failure: Secondary | ICD-10-CM | POA: Diagnosis not present

## 2020-10-22 DIAGNOSIS — Z7984 Long term (current) use of oral hypoglycemic drugs: Secondary | ICD-10-CM | POA: Diagnosis not present

## 2020-10-23 ENCOUNTER — Encounter (HOSPITAL_COMMUNITY): Payer: Self-pay | Admitting: Orthopedic Surgery

## 2020-10-23 DIAGNOSIS — I110 Hypertensive heart disease with heart failure: Secondary | ICD-10-CM | POA: Diagnosis not present

## 2020-10-23 DIAGNOSIS — Z471 Aftercare following joint replacement surgery: Secondary | ICD-10-CM | POA: Diagnosis not present

## 2020-10-23 DIAGNOSIS — J45909 Unspecified asthma, uncomplicated: Secondary | ICD-10-CM | POA: Diagnosis not present

## 2020-10-23 DIAGNOSIS — M797 Fibromyalgia: Secondary | ICD-10-CM | POA: Diagnosis not present

## 2020-10-23 DIAGNOSIS — E1140 Type 2 diabetes mellitus with diabetic neuropathy, unspecified: Secondary | ICD-10-CM | POA: Diagnosis not present

## 2020-10-23 DIAGNOSIS — I4819 Other persistent atrial fibrillation: Secondary | ICD-10-CM | POA: Diagnosis not present

## 2020-10-24 DIAGNOSIS — J45909 Unspecified asthma, uncomplicated: Secondary | ICD-10-CM | POA: Diagnosis not present

## 2020-10-24 DIAGNOSIS — I4819 Other persistent atrial fibrillation: Secondary | ICD-10-CM | POA: Diagnosis not present

## 2020-10-24 DIAGNOSIS — M797 Fibromyalgia: Secondary | ICD-10-CM | POA: Diagnosis not present

## 2020-10-24 DIAGNOSIS — E1140 Type 2 diabetes mellitus with diabetic neuropathy, unspecified: Secondary | ICD-10-CM | POA: Diagnosis not present

## 2020-10-24 DIAGNOSIS — Z471 Aftercare following joint replacement surgery: Secondary | ICD-10-CM | POA: Diagnosis not present

## 2020-10-24 DIAGNOSIS — I110 Hypertensive heart disease with heart failure: Secondary | ICD-10-CM | POA: Diagnosis not present

## 2020-10-27 DIAGNOSIS — E1140 Type 2 diabetes mellitus with diabetic neuropathy, unspecified: Secondary | ICD-10-CM | POA: Diagnosis not present

## 2020-10-27 DIAGNOSIS — M797 Fibromyalgia: Secondary | ICD-10-CM | POA: Diagnosis not present

## 2020-10-27 DIAGNOSIS — J45909 Unspecified asthma, uncomplicated: Secondary | ICD-10-CM | POA: Diagnosis not present

## 2020-10-27 DIAGNOSIS — I110 Hypertensive heart disease with heart failure: Secondary | ICD-10-CM | POA: Diagnosis not present

## 2020-10-27 DIAGNOSIS — Z471 Aftercare following joint replacement surgery: Secondary | ICD-10-CM | POA: Diagnosis not present

## 2020-10-27 DIAGNOSIS — I4819 Other persistent atrial fibrillation: Secondary | ICD-10-CM | POA: Diagnosis not present

## 2020-10-29 DIAGNOSIS — E1140 Type 2 diabetes mellitus with diabetic neuropathy, unspecified: Secondary | ICD-10-CM | POA: Diagnosis not present

## 2020-10-29 DIAGNOSIS — I110 Hypertensive heart disease with heart failure: Secondary | ICD-10-CM | POA: Diagnosis not present

## 2020-10-29 DIAGNOSIS — I4819 Other persistent atrial fibrillation: Secondary | ICD-10-CM | POA: Diagnosis not present

## 2020-10-29 DIAGNOSIS — J45909 Unspecified asthma, uncomplicated: Secondary | ICD-10-CM | POA: Diagnosis not present

## 2020-10-29 DIAGNOSIS — Z471 Aftercare following joint replacement surgery: Secondary | ICD-10-CM | POA: Diagnosis not present

## 2020-10-29 DIAGNOSIS — M797 Fibromyalgia: Secondary | ICD-10-CM | POA: Diagnosis not present

## 2020-10-30 DIAGNOSIS — Z96651 Presence of right artificial knee joint: Secondary | ICD-10-CM | POA: Diagnosis not present

## 2020-10-31 DIAGNOSIS — M6281 Muscle weakness (generalized): Secondary | ICD-10-CM | POA: Diagnosis not present

## 2020-10-31 DIAGNOSIS — M25561 Pain in right knee: Secondary | ICD-10-CM | POA: Diagnosis not present

## 2020-11-05 DIAGNOSIS — M6281 Muscle weakness (generalized): Secondary | ICD-10-CM | POA: Diagnosis not present

## 2020-11-05 DIAGNOSIS — M25561 Pain in right knee: Secondary | ICD-10-CM | POA: Diagnosis not present

## 2020-11-07 DIAGNOSIS — M25561 Pain in right knee: Secondary | ICD-10-CM | POA: Diagnosis not present

## 2020-11-07 DIAGNOSIS — M6281 Muscle weakness (generalized): Secondary | ICD-10-CM | POA: Diagnosis not present

## 2020-11-10 DIAGNOSIS — M25561 Pain in right knee: Secondary | ICD-10-CM | POA: Diagnosis not present

## 2020-11-10 DIAGNOSIS — M6281 Muscle weakness (generalized): Secondary | ICD-10-CM | POA: Diagnosis not present

## 2020-11-13 DIAGNOSIS — M6281 Muscle weakness (generalized): Secondary | ICD-10-CM | POA: Diagnosis not present

## 2020-11-13 DIAGNOSIS — M25561 Pain in right knee: Secondary | ICD-10-CM | POA: Diagnosis not present

## 2020-11-14 DIAGNOSIS — R635 Abnormal weight gain: Secondary | ICD-10-CM | POA: Diagnosis not present

## 2020-11-14 DIAGNOSIS — E119 Type 2 diabetes mellitus without complications: Secondary | ICD-10-CM | POA: Diagnosis not present

## 2020-11-14 DIAGNOSIS — Z6834 Body mass index (BMI) 34.0-34.9, adult: Secondary | ICD-10-CM | POA: Diagnosis not present

## 2020-11-14 DIAGNOSIS — I10 Essential (primary) hypertension: Secondary | ICD-10-CM | POA: Diagnosis not present

## 2020-11-18 DIAGNOSIS — M6281 Muscle weakness (generalized): Secondary | ICD-10-CM | POA: Diagnosis not present

## 2020-11-18 DIAGNOSIS — M25561 Pain in right knee: Secondary | ICD-10-CM | POA: Diagnosis not present

## 2020-11-21 DIAGNOSIS — M25561 Pain in right knee: Secondary | ICD-10-CM | POA: Diagnosis not present

## 2020-11-21 DIAGNOSIS — M6281 Muscle weakness (generalized): Secondary | ICD-10-CM | POA: Diagnosis not present

## 2020-11-26 DIAGNOSIS — M25561 Pain in right knee: Secondary | ICD-10-CM | POA: Diagnosis not present

## 2020-11-26 DIAGNOSIS — M6281 Muscle weakness (generalized): Secondary | ICD-10-CM | POA: Diagnosis not present

## 2020-11-28 DIAGNOSIS — M6281 Muscle weakness (generalized): Secondary | ICD-10-CM | POA: Diagnosis not present

## 2020-11-28 DIAGNOSIS — M25561 Pain in right knee: Secondary | ICD-10-CM | POA: Diagnosis not present

## 2020-12-02 ENCOUNTER — Encounter (HOSPITAL_COMMUNITY): Payer: Self-pay | Admitting: Nurse Practitioner

## 2020-12-02 ENCOUNTER — Ambulatory Visit (HOSPITAL_COMMUNITY)
Admission: RE | Admit: 2020-12-02 | Discharge: 2020-12-02 | Disposition: A | Discharge status: Home / Self Care | Payer: Medicare Other | Source: Ambulatory Visit | Attending: Nurse Practitioner | Admitting: Nurse Practitioner

## 2020-12-02 ENCOUNTER — Ambulatory Visit (HOSPITAL_COMMUNITY): Payer: No Typology Code available for payment source | Admitting: Nurse Practitioner

## 2020-12-02 ENCOUNTER — Other Ambulatory Visit: Payer: Self-pay

## 2020-12-02 VITALS — BP 114/60 | HR 66 | Ht 65.0 in | Wt 215.2 lb

## 2020-12-02 DIAGNOSIS — Z7901 Long term (current) use of anticoagulants: Secondary | ICD-10-CM | POA: Insufficient documentation

## 2020-12-02 DIAGNOSIS — Z888 Allergy status to other drugs, medicaments and biological substances status: Secondary | ICD-10-CM | POA: Insufficient documentation

## 2020-12-02 DIAGNOSIS — Z7984 Long term (current) use of oral hypoglycemic drugs: Secondary | ICD-10-CM | POA: Insufficient documentation

## 2020-12-02 DIAGNOSIS — G4733 Obstructive sleep apnea (adult) (pediatric): Secondary | ICD-10-CM | POA: Insufficient documentation

## 2020-12-02 DIAGNOSIS — I4819 Other persistent atrial fibrillation: Secondary | ICD-10-CM | POA: Diagnosis not present

## 2020-12-02 DIAGNOSIS — Z96651 Presence of right artificial knee joint: Secondary | ICD-10-CM | POA: Insufficient documentation

## 2020-12-02 DIAGNOSIS — R7303 Prediabetes: Secondary | ICD-10-CM | POA: Insufficient documentation

## 2020-12-02 DIAGNOSIS — Z79899 Other long term (current) drug therapy: Secondary | ICD-10-CM | POA: Diagnosis not present

## 2020-12-02 DIAGNOSIS — M25561 Pain in right knee: Secondary | ICD-10-CM | POA: Diagnosis not present

## 2020-12-02 DIAGNOSIS — Z6835 Body mass index (BMI) 35.0-35.9, adult: Secondary | ICD-10-CM | POA: Diagnosis not present

## 2020-12-02 DIAGNOSIS — M6281 Muscle weakness (generalized): Secondary | ICD-10-CM | POA: Diagnosis not present

## 2020-12-02 DIAGNOSIS — I5022 Chronic systolic (congestive) heart failure: Secondary | ICD-10-CM | POA: Insufficient documentation

## 2020-12-02 DIAGNOSIS — E669 Obesity, unspecified: Secondary | ICD-10-CM | POA: Insufficient documentation

## 2020-12-02 DIAGNOSIS — Z7722 Contact with and (suspected) exposure to environmental tobacco smoke (acute) (chronic): Secondary | ICD-10-CM | POA: Diagnosis not present

## 2020-12-02 LAB — MAGNESIUM: Magnesium: 2.4 mg/dL (ref 1.7–2.4)

## 2020-12-02 NOTE — Progress Notes (Signed)
Primary Care Physician: Christie Char, FNP Referring Physician:Dr. Dellie Atkinson is a 65 y.o. female with a h/o persistent afib that was having  breakthrough afib with Tikosyn and had an afib ablation 03/09/18 with [name]. She also had foot surgery last summer and had more paroxysmal afib during that time.  The afib  calmed down now that her foot surgery stress has resolved. She remains  on eliquis 5 mg bid with a CHA2DS2VASc score of 3( HF, female, pre -diabetes)  F/u in afib clinic 5/12, She spent most of the winter in [place] with her husband visiting their daughter. Her husband has developed melanoma and his health is poor at this time. She  has been very  worried about him. She  had a lot of afib while in [place] and the elevation increased her issues with dyspnea. She saw a cardiologist while there and her lasix was increased as well as metoprolol added. She remains on dofetilide 125 mcg bid. An echo was done and she was told something about the pump of the heart. She had an EF of 40-45% before SR was restored with dofetilide, it did normalize with SR. She remains on eliquis 5 mg bid . She had a knee injection today and may have to have knee replacement in the future.   F/u in afib clinic, 12/02/20. She has had rt knee surgery 5/17. Just 2 short afib issues, self converted. Continues to be complaint with Tikosyn and eliquis.   Today, she denies symptoms of palpitations, chest pain, shortness of breath, orthopnea, PND, lower extremity edema, dizziness, presyncope, syncope, or neurologic sequela. The patient is tolerating medications without difficulties and is otherwise without complaint today.   Past Medical History:  Diagnosis Date   Asthma    Atrial fibrillation ([place])    Back pain    Diabetes mellitus without complication (HCC)    Diastolic dysfunction    Dysrhythmia    a fib   Fibromyalgia    neuropathy feet   History of hiatal hernia    History of kidney stones     Hypertension    Knee pain    Osteoarthritis    Persistent atrial fibrillation (HCC)    Plantar fasciitis    Pneumonia    Sleep apnea    wears oral appliance   Visit for monitoring Tikosyn therapy 02/07/2018   Past Surgical History:  Procedure Laterality Date   ABDOMINAL HYSTERECTOMY     ABLATION OF DYSRHYTHMIC FOCUS  03/09/2018   APPENDECTOMY     ATRIAL FIBRILLATION ABLATION N/A 03/09/2018   Procedure: ATRIAL FIBRILLATION ABLATION;  Surgeon: [name], MD;  Location: [place] [facility];  Service: Cardiovascular;  Laterality: N/A;   CARDIOVERSION N/A 07/04/2017   Procedure: CARDIOVERSION;  Surgeon: [name], MD;  Location: [facility] ENDOSCOPY;  Service: Cardiovascular;  Laterality: N/A;   CARDIOVERSION N/A 09/06/2017   Procedure: CARDIOVERSION;  Surgeon: [name], MD;  Location: [facility] ENDOSCOPY;  Service: Cardiovascular;  Laterality: N/A;   CHOLECYSTECTOMY N/A 02/24/2015   Procedure: LAPAROSCOPIC CHOLECYSTECTOMY;  Surgeon: [name], MD;  Location: [place];  Service: General;  Laterality: N/A;   EXCISION HAGLUND'S DEFORMITY WITH ACHILLES TENDON REPAIR Left 12/21/2018   Procedure: Left Achilles Tendon Debridement and Reconstruction; Excision of Haglund Deformity;  Surgeon: [name], MD;  Location: [place];  Service: Orthopedics;  Laterality: Left;   GASTROCNEMIUS RECESSION Left 12/21/2018   Procedure: Left Gastroc Recession;  Surgeon: [name], MD;  Location: [place];  Service: Orthopedics;  Laterality: Left;   RIGHT/LEFT HEART CATH AND CORONARY ANGIOGRAPHY N/A 08/09/2017   Procedure: RIGHT/LEFT HEART CATH AND CORONARY ANGIOGRAPHY;  Surgeon: [name], MD;  Location: [place] [facility];  Service: Cardiovascular;  Laterality: N/A;   TEE WITHOUT CARDIOVERSION N/A 07/04/2017   Procedure: TRANSESOPHAGEAL ECHOCARDIOGRAM (TEE);  Surgeon: [name], MD;  Location: [facility] ENDOSCOPY;  Service: Cardiovascular;  Laterality: N/A;   TEE WITHOUT  CARDIOVERSION N/A 03/09/2018   Procedure: TRANSESOPHAGEAL ECHOCARDIOGRAM (TEE);  Surgeon: [name], MD;  Location: [facility] ENDOSCOPY;  Service: Cardiovascular;  Laterality: N/A;   TOTAL KNEE ARTHROPLASTY Right 10/20/2020   Procedure: TOTAL KNEE ARTHROPLASTY;  Surgeon: Vickey Huger, MD;  Location: [facility];  Service: Orthopedics;  Laterality: Right;   VAGINAL DELIVERY     x4    Current Outpatient Medications  Medication Sig Dispense Refill   acidophilus (RISAQUAD) CAPS capsule Take 1 capsule by mouth daily.     albuterol (VENTOLIN HFA) 108 (90 Base) MCG/ACT inhaler Inhale 2 puffs into the lungs every 4 (four) hours as needed for wheezing or shortness of breath.     apixaban (ELIQUIS) 5 MG TABS tablet Take 1 tablet (5 mg total) by mouth 2 (two) times daily. 180 tablet 3   Coenzyme Q10 (CO Q10 PO) Take 1 capsule by mouth daily.     diltiazem (CARDIZEM CD) 120 MG 24 hr capsule Take 2 tablets in the AM and 1 tablet in the PM (Patient taking differently: Take 120-240 mg by mouth See admin instructions. Take 240 mg in the morning and 120 mg in the evening) 270 capsule 2   dofetilide (TIKOSYN) 125 MCG capsule Take 1 capsule (125 mcg total) by mouth 2 (two) times daily. 180 capsule 1   Exenatide ER (BYDUREON BCISE) 2 MG/0.85ML AUIJ      fluticasone-salmeterol (WIXELA INHUB) 100-50 MCG/ACT AEPB Inhale 2 puffs into the lungs daily in the afternoon.     furosemide (LASIX) 20 MG tablet Take 20 mg by mouth 2 (two) times daily.      gabapentin (NEURONTIN) 300 MG capsule Take 1 capsule (300 mg total) by mouth 3 (three) times daily. 30 capsule 0   lidocaine (LIDODERM) 5 % Place 1 patch onto the skin daily. Remove & Discard patch within 12 hours or as directed by MD     liraglutide (VICTOZA) 18 MG/3ML SOPN Inject 1.8 mg into the skin every evening.     Magnesium Gluconate 500 (27 Mg) MG TABS Take 1 tablet (500 mg total) by mouth daily. (Patient taking differently: Take 1 tablet by mouth at bedtime.) 30 tablet 0    metFORMIN (GLUCOPHAGE) 500 MG tablet Take 1 tablet (500 mg total) by mouth 2 (two) times daily with a meal. (Patient taking differently: Take 500 mg by mouth 2 (two) times daily as needed (carb intake).) 180 tablet 0   methocarbamol (ROBAXIN) 500 MG tablet Take 1-2 tablets (500-1,000 mg total) by mouth every 6 (six) hours as needed for muscle spasms. 60 tablet 0   metoprolol succinate (TOPROL-XL) 50 MG 24 hr tablet Take 1 tablet (50 mg total) by mouth as needed. Take with or immediately following a meal.     Multiple Vitamin (MULTIVITAMIN ADULT PO) Take by mouth. Taking Copaiba-one tablet by mouth daily     Multiple Vitamins-Minerals (MULTIVITAMIN ADULTS PO) Take by mouth. Serenity - Taking one tablet by mouth as needed for sleep     Multiple Vitamins-Minerals (WOMENS MULTIVITAMIN)  TABS Take 1 tablet by mouth daily. 90 tablet 0   potassium chloride (MICRO-K) 10 MEQ CR capsule Take 10 mEq by mouth 2 (two) times daily.     vitamin E 180 MG (400 UNITS) capsule Take 400 Units by mouth daily. (Patient not taking: Reported on 12/02/2020)     No current facility-administered medications for this encounter.    Allergies  Allergen Reactions   Nickel Hives and Itching   Elemental Sulfur Hives   Hydrocodone-Acetaminophen Nausea And Vomiting   Other Other (See Comments)    Polyester - including hospital gowns and sheets - allergic contact dermatitis    Social History   Socioeconomic History   Marital status: Married    Spouse name: Not on file   Number of children: 4   Years of education: Not on file   Highest education level: Not on file  Occupational History   Occupation: Disabled/retired  Tobacco Use   Smoking status: Passive Smoke Exposure - Never Smoker   Smokeless tobacco: Never  Vaping Use   Vaping Use: Never used  Substance and Sexual Activity   Alcohol use: Yes    Comment: little   Drug use: No   Sexual activity: Not Currently  Other Topics Concern   Not on file  Social  History Narrative   Lives in [place]   Not working   Social Determinants of Health   Financial Resource Strain: Not on file  Food Insecurity: Not on file  Transportation Needs: Not on file  Physical Activity: Not on file  Stress: Not on file  Social Connections: Not on file  Intimate Partner Violence: Not on file    Family History  Problem Relation Age of Onset   Heart disease Mother    Hypertension Mother    Hyperlipidemia Mother    Diabetes Mother    Sudden death Mother    Thyroid disease Mother    Obesity Mother    Heart disease Father    Heart attack Father    Hypertension Father    Hyperlipidemia Father    Diabetes Father    Allergic rhinitis Son    Eczema Son    Allergic rhinitis Daughter    Eczema Daughter    Angioedema Neg Hx    Asthma Neg Hx    Immunodeficiency Neg Hx     ROS- All systems are reviewed and negative except as per the HPI above  Physical Exam: Vitals:   12/02/20 0828  BP: 114/60  Pulse: 66  Weight: 97.6 kg  Height: 5\' 5"  (1.651 m)   Wt Readings from Last 3 Encounters:  12/02/20 97.6 kg  10/20/20 97.5 kg  10/16/20 97.5 kg    Labs: Lab Results  Component Value Date   NA 138 10/21/2020   K 4.1 10/21/2020   CL 107 10/21/2020   CO2 23 10/21/2020   GLUCOSE 178 (H) 10/21/2020   BUN 16 10/21/2020   CREATININE 0.54 10/21/2020   CALCIUM 8.9 10/21/2020   MG 2.3 06/02/2020   Lab Results  Component Value Date   INR 1.07 08/09/2017   Lab Results  Component Value Date   CHOL 160 01/24/2019   HDL 31 (L) 01/24/2019   LDLCALC 94 01/24/2019   TRIG 174 (H) 01/24/2019    GEN- The patient is well appearing obese female, alert and oriented x 3 today.   HEENT-head normocephalic, atraumatic, sclera clear, conjunctiva pink, hearing intact, trachea midline. Lungs- Clear to ausculation bilaterally, normal work of breathing Heart- Regular rate  and rhythm, no murmurs, rubs or gallops  GI- soft, NT, ND, + BS Extremities- no clubbing,  cyanosis, or edema MS- no significant deformity or atrophy Skin- no rash or lesion Psych- euthymic mood, full affect Neuro- strength and sensation are intact  EKG- SR with  HR of  66, pr int 172 ms, qrs int 84 ms, qtc 431 ms  Echo-  1. Left ventricular ejection fraction, by estimation, is 60 to 65%. The  left ventricle has normal function. The left ventricle has no regional  wall motion abnormalities. Left ventricular diastolic parameters were  normal.   2. Right ventricular systolic function is normal. The right ventricular  size is normal. There is normal pulmonary artery systolic pressure.   3. Left atrial size was moderately dilated.   4. Cannot r/o PFO.   5. The mitral valve is degenerative. Trivial mitral valve regurgitation.  No evidence of mitral stenosis.   6. The aortic valve is tricuspid. Aortic valve regurgitation is not  visualized. Mild aortic valve sclerosis is present, with no evidence of  aortic valve stenosis.   7. The inferior vena cava is normal in size with greater than 50%  respiratory variability, suggesting right atrial pressure of 3 mmHg.    Assessment and Plan:  1. Persistent Atrial fibrillation Low afib burden  In SR today  Continue  Tikosyn, stable qtc. Continue diltiazem, metoprolol  daily Continue Eliquis 5 mg BID for CHA2DS2VASc score of 3. Mag today, K+ 5/17 was 4.1 with stable creatinine at 0.64  2. Systolic dysfunction Echo form January reviewed with pt with  normal EF Avoid salt/limit fluids  Lasix as prescribed   3. Obesity Body mass index is 35.81 kg/m. Lifestyle modification was discussed and encouraged including regular physical activity and weight reduction. Has gained some weight over the winter months   4. OSA Now using oral appliance from Dr Ron Parker. Encouraged compliance.  5. Rt knee replacement  Recovering  nicely    Follow up with AF clinic in 6 months    [place]. Christie Atkinson, [facility] [address] [place], [place] [id number] [phone]

## 2020-12-05 DIAGNOSIS — M25561 Pain in right knee: Secondary | ICD-10-CM | POA: Diagnosis not present

## 2020-12-05 DIAGNOSIS — M6281 Muscle weakness (generalized): Secondary | ICD-10-CM | POA: Diagnosis not present

## 2020-12-10 DIAGNOSIS — M25561 Pain in right knee: Secondary | ICD-10-CM | POA: Diagnosis not present

## 2020-12-10 DIAGNOSIS — M6281 Muscle weakness (generalized): Secondary | ICD-10-CM | POA: Diagnosis not present

## 2020-12-12 DIAGNOSIS — M25561 Pain in right knee: Secondary | ICD-10-CM | POA: Diagnosis not present

## 2020-12-12 DIAGNOSIS — M6281 Muscle weakness (generalized): Secondary | ICD-10-CM | POA: Diagnosis not present

## 2020-12-15 DIAGNOSIS — M6281 Muscle weakness (generalized): Secondary | ICD-10-CM | POA: Diagnosis not present

## 2020-12-15 DIAGNOSIS — M25561 Pain in right knee: Secondary | ICD-10-CM | POA: Diagnosis not present

## 2020-12-18 DIAGNOSIS — M6281 Muscle weakness (generalized): Secondary | ICD-10-CM | POA: Diagnosis not present

## 2020-12-18 DIAGNOSIS — M25561 Pain in right knee: Secondary | ICD-10-CM | POA: Diagnosis not present

## 2020-12-22 ENCOUNTER — Other Ambulatory Visit: Payer: Self-pay | Admitting: Orthopedic Surgery

## 2020-12-22 DIAGNOSIS — M6281 Muscle weakness (generalized): Secondary | ICD-10-CM | POA: Diagnosis not present

## 2020-12-22 DIAGNOSIS — M25561 Pain in right knee: Secondary | ICD-10-CM | POA: Diagnosis not present

## 2020-12-24 DIAGNOSIS — M6281 Muscle weakness (generalized): Secondary | ICD-10-CM | POA: Diagnosis not present

## 2020-12-24 DIAGNOSIS — M25561 Pain in right knee: Secondary | ICD-10-CM | POA: Diagnosis not present

## 2020-12-25 DIAGNOSIS — R635 Abnormal weight gain: Secondary | ICD-10-CM | POA: Diagnosis not present

## 2020-12-25 DIAGNOSIS — Z6835 Body mass index (BMI) 35.0-35.9, adult: Secondary | ICD-10-CM | POA: Diagnosis not present

## 2020-12-25 DIAGNOSIS — R7303 Prediabetes: Secondary | ICD-10-CM | POA: Diagnosis not present

## 2020-12-25 DIAGNOSIS — I10 Essential (primary) hypertension: Secondary | ICD-10-CM | POA: Diagnosis not present

## 2020-12-29 DIAGNOSIS — M6281 Muscle weakness (generalized): Secondary | ICD-10-CM | POA: Diagnosis not present

## 2020-12-29 DIAGNOSIS — M25561 Pain in right knee: Secondary | ICD-10-CM | POA: Diagnosis not present

## 2020-12-31 ENCOUNTER — Other Ambulatory Visit (HOSPITAL_COMMUNITY): Payer: Self-pay

## 2020-12-31 MED ORDER — DILTIAZEM HCL ER COATED BEADS 120 MG PO CP24: ORAL_CAPSULE | ORAL | 2 refills | Status: DC

## 2020-12-31 MED ORDER — FUROSEMIDE 20 MG PO TABS: 20.0000 mg | ORAL_TABLET | Freq: Two times a day (BID) | ORAL | 3 refills | Status: DC

## 2021-01-07 DIAGNOSIS — G4733 Obstructive sleep apnea (adult) (pediatric): Secondary | ICD-10-CM | POA: Diagnosis not present

## 2021-01-07 DIAGNOSIS — J301 Allergic rhinitis due to pollen: Secondary | ICD-10-CM | POA: Diagnosis not present

## 2021-01-07 DIAGNOSIS — J479 Bronchiectasis, uncomplicated: Secondary | ICD-10-CM | POA: Diagnosis not present

## 2021-01-07 DIAGNOSIS — J4540 Moderate persistent asthma, uncomplicated: Secondary | ICD-10-CM | POA: Diagnosis not present

## 2021-01-07 DIAGNOSIS — R5383 Other fatigue: Secondary | ICD-10-CM | POA: Diagnosis not present

## 2021-01-09 DIAGNOSIS — M6281 Muscle weakness (generalized): Secondary | ICD-10-CM | POA: Diagnosis not present

## 2021-01-09 DIAGNOSIS — M25561 Pain in right knee: Secondary | ICD-10-CM | POA: Diagnosis not present

## 2021-01-16 DIAGNOSIS — M6281 Muscle weakness (generalized): Secondary | ICD-10-CM | POA: Diagnosis not present

## 2021-01-16 DIAGNOSIS — M25561 Pain in right knee: Secondary | ICD-10-CM | POA: Diagnosis not present

## 2021-01-19 NOTE — Patient Instructions (Addendum)
DUE TO COVID-19 ONLY ONE VISITOR IS ALLOWED TO COME WITH YOU AND STAY IN THE WAITING ROOM ONLY DURING PRE OP AND PROCEDURE DAY OF SURGERY. THE 2 VISITORS  MAY VISIT WITH YOU AFTER SURGERY IN YOUR PRIVATE ROOM DURING VISITING HOURS ONLY!  YOU NEED TO HAVE A COVID 19 TEST ON___8/25/22____                 [name]     Your procedure is scheduled on: 02/02/21   Report to [place]  Entrance   Report to short stay at 5:30 AM     Call this number if you have problems the morning of surgery [phone]     [place], NO CHEWING GUM [place].  No food after midnight.    You may have clear liquid until 5:00 AM.    At 4:30 AM drink pre surgery drink.   Nothing by mouth after 5:00 AM.     Take these medicines the morning of surgery with A SIP OF WATER: Use your inhaler and bring it with you,   Bring your mask and tubing. Take Dofetilide (Tikosyn) ,Metoprolol, Diltiazem  Stop taking Eliquis___________on 8/24__________as instructed by _Dr. Lucey____________.  Stop taking ____________as directed by your Surgeon/Cardiologist.  Contact your Surgeon/Cardiologist for instructions on Anticoagulant Therapy prior to surgery.    How to Manage Your Diabetes Before and After Surgery  Why is it important to control my blood sugar before and after surgery? Improving blood sugar levels before and after surgery helps healing and can limit problems. A way of improving blood sugar control is eating a healthy diet by:  Eating less sugar and carbohydrates  Increasing activity/exercise  Talking with your doctor about reaching your blood sugar goals High blood sugars (greater than 180 mg/dL) can raise your risk of infections and slow your recovery, so you will need to focus on controlling your diabetes during the weeks before surgery. Make sure that the doctor who takes care of your diabetes knows about your planned  surgery including the date and location.  How do I manage my blood sugar before surgery? Check your blood sugar at least 4 times a day, starting 2 days before surgery, to make sure that the level is not too high or low. Check your blood sugar the morning of your surgery when you wake up and every 2 hours until you get to the Short Stay unit. If your blood sugar is less than 70 mg/dL, you will need to treat for low blood sugar: Do not take insulin. Treat a low blood sugar (less than 70 mg/dL) with  cup of clear juice (cranberry or apple), 4 glucose tablets, OR glucose gel. Recheck blood sugar in 15 minutes after treatment (to make sure it is greater than 70 mg/dL). If your blood sugar is not greater than 70 mg/dL on recheck, call [phone] for further instructions. Report your blood sugar to the short stay nurse when you get to Short Stay.  If you are admitted to the hospital after surgery: Your blood sugar will be checked by the staff and you will probably be given insulin after surgery (instead of oral diabetes medicines) to make sure you have good blood sugar levels. The goal for blood sugar control after surgery is 80-180 mg/dL.   WHAT DO I DO ABOUT MY DIABETES MEDICATION?  Do not take oral diabetes medicines (pills) the morning of surgery.  The day of surgery, do not take other diabetes injectables, including Byetta (exenatide), Bydureon (exenatide ER), Victoza (liraglutide), or Trulicity (dulaglutide).                                 You may not have any metal on your body including hair pins and              piercings  Do not wear jewelry, make-up, lotions, powders or perfumes, deodorant             Do not wear nail polish on your fingernails.  Do not shave  48 hours prior to surgery.              Men may shave face and neck.   Do not bring valuables to the hospital. [place].  Contacts, dentures or bridgework may not be worn  into surgery.  Leave suitcase in the car. After surgery it may be brought to your room.     Patients discharged the day of surgery will not be allowed to drive home. IF YOU ARE HAVING SURGERY AND GOING HOME THE SAME DAY, YOU MUST HAVE AN ADULT TO DRIVE YOU HOME AND BE WITH YOU FOR 24 HOURS. YOU MAY GO HOME BY TAXI OR UBER OR ORTHERWISE, BUT AN ADULT MUST ACCOMPANY YOU HOME AND STAY WITH YOU FOR 24 HOURS.  Name and phone number of your driver:  Special Instructions: N/A              Please read over the following fact sheets you were given: _____________________________________________________________________             [facility] - Preparing for Surgery Before surgery, you can play an important role.  Because skin is not sterile, your skin needs to be as free of germs as possible.  You can reduce the number of germs on your skin by washing with CHG (chlorahexidine gluconate) soap before surgery.  CHG is an antiseptic cleaner which kills germs and bonds with the skin to continue killing germs even after washing. Please DO NOT use if you have an allergy to CHG or antibacterial soaps.  If your skin becomes reddened/irritated stop using the CHG and inform your nurse when you arrive at Short Stay. Do not shave (including legs and underarms) for at least 48 hours prior to the first CHG shower.  You may shave your face/neck. Please follow these instructions carefully:  1.  Shower with CHG Soap the night before surgery and the  morning of Surgery.  2.  If you choose to wash your hair, wash your hair first as usual with your  normal  shampoo.  3.  After you shampoo, rinse your hair and body thoroughly to remove the  shampoo.                                        4.  Use CHG as you would any other liquid soap.  You can apply chg directly  to the skin and wash                       Gently with a scrungie or clean washcloth.  5.  Apply the  CHG Soap to your body ONLY FROM THE NECK DOWN.   Do not use on  face/ open                           Wound or open sores. Avoid contact with eyes, ears mouth and genitals (private parts).                       Wash face,  Genitals (private parts) with your normal soap.             6.  Wash thoroughly, paying special attention to the area where your surgery  will be performed.  7.  Thoroughly rinse your body with warm water from the neck down.  8.  DO NOT shower/wash with your normal soap after using and rinsing off  the CHG Soap.             9.  Pat yourself dry with a clean towel.            10.  Wear clean pajamas.            11.  Place clean sheets on your bed the night of your first shower and do not  sleep with pets. Day of Surgery : Do not apply any lotions/deodorants the morning of surgery.  Please wear clean clothes to the hospital/surgery center.  FAILURE TO FOLLOW THESE INSTRUCTIONS MAY RESULT IN THE CANCELLATION OF YOUR SURGERY PATIENT SIGNATURE_________________________________  NURSE SIGNATURE__________________________________  ________________________________________________________________________   Adam Phenix  An incentive spirometer is a tool that can help keep your lungs clear and active. This tool measures how well you are filling your lungs with each breath. Taking long deep breaths may help reverse or decrease the chance of developing breathing (pulmonary) problems (especially infection) following: A long period of time when you are unable to move or be active. BEFORE THE PROCEDURE  If the spirometer includes an indicator to show your best effort, your nurse or respiratory therapist will set it to a desired goal. If possible, sit up straight or lean slightly forward. Try not to slouch. Hold the incentive spirometer in an upright position. INSTRUCTIONS FOR USE  Sit on the edge of your bed if possible, or sit up as far as you can in bed or on a chair. Hold the incentive spirometer in an upright position. Breathe out  normally. Place the mouthpiece in your mouth and seal your lips tightly around it. Breathe in slowly and as deeply as possible, raising the piston or the ball toward the top of the column. Hold your breath for 3-5 seconds or for as long as possible. Allow the piston or ball to fall to the bottom of the column. Remove the mouthpiece from your mouth and breathe out normally. Rest for a few seconds and repeat Steps 1 through 7 at least 10 times every 1-2 hours when you are awake. Take your time and take a few normal breaths between deep breaths. The spirometer may include an indicator to show your best effort. Use the indicator as a goal to work toward during each repetition. After each set of 10 deep breaths, practice coughing to be sure your lungs are clear. If you have an incision (the cut made at the time of surgery), support your incision when coughing by placing a pillow or rolled up towels firmly against it. Once you are able to get out of bed, walk around indoors  and cough well. You may stop using the incentive spirometer when instructed by your caregiver.  RISKS AND COMPLICATIONS Take your time so you do not get dizzy or light-headed. If you are in pain, you may need to take or ask for pain medication before doing incentive spirometry. It is harder to take a deep breath if you are having pain. AFTER USE Rest and breathe slowly and easily. It can be helpful to keep track of a log of your progress. Your caregiver can provide you with a simple table to help with this. If you are using the spirometer at home, follow these instructions: [place] IF:  You are having difficultly using the spirometer. You have trouble using the spirometer as often as instructed. Your pain medication is not giving enough relief while using the spirometer. You develop fever of 100.5 F (38.1 C) or higher. SEEK IMMEDIATE MEDICAL CARE IF:  You cough up bloody sputum that had not been present before. You  develop fever of 102 F (38.9 C) or greater. You develop worsening pain at or near the incision site. MAKE SURE YOU:  Understand these instructions. Will watch your condition. Will get help right away if you are not doing well or get worse. Document Released: 10/04/2006 Document Revised: 08/16/2011 Document Reviewed: 12/05/2006 [facility] Patient Information 2014 [place], [place].   ________________________________________________________________________

## 2021-01-20 ENCOUNTER — Encounter (HOSPITAL_COMMUNITY)
Admission: RE | Admit: 2021-01-20 | Discharge: 2021-01-20 | Disposition: A | Discharge status: Home / Self Care | Payer: Medicare Other | Source: Ambulatory Visit | Attending: Orthopedic Surgery | Admitting: Orthopedic Surgery

## 2021-01-20 ENCOUNTER — Other Ambulatory Visit: Payer: Self-pay

## 2021-01-20 ENCOUNTER — Encounter (HOSPITAL_COMMUNITY): Payer: Self-pay

## 2021-01-20 DIAGNOSIS — E119 Type 2 diabetes mellitus without complications: Secondary | ICD-10-CM | POA: Diagnosis not present

## 2021-01-20 DIAGNOSIS — I4891 Unspecified atrial fibrillation: Secondary | ICD-10-CM | POA: Insufficient documentation

## 2021-01-20 DIAGNOSIS — Z01812 Encounter for preprocedural laboratory examination: Secondary | ICD-10-CM | POA: Diagnosis not present

## 2021-01-20 HISTORY — DX: Gastro-esophageal reflux disease without esophagitis: K21.9

## 2021-01-20 LAB — COMPREHENSIVE METABOLIC PANEL
ALT: 15 U/L (ref 0–44)
AST: 22 U/L (ref 15–41)
Albumin: 3.9 g/dL (ref 3.5–5.0)
Alkaline Phosphatase: 63 U/L (ref 38–126)
Anion gap: 6 (ref 5–15)
BUN: 17 mg/dL (ref 8–23)
CO2: 27 mmol/L (ref 22–32)
Calcium: 9 mg/dL (ref 8.9–10.3)
Chloride: 104 mmol/L (ref 98–111)
Creatinine, Ser: 0.73 mg/dL (ref 0.44–1.00)
GFR, Estimated: >60 mL/min (ref >60)
Glucose, Bld: 97 mg/dL (ref 70–99)
Potassium: 4.9 mmol/L (ref 3.5–5.1)
Sodium: 137 mmol/L (ref 135–145)
Total Bilirubin: 0.5 mg/dL (ref 0.3–1.2)
Total Protein: 7.8 g/dL (ref 6.5–8.1)

## 2021-01-20 LAB — TYPE AND SCREEN
ABO/RH(D): A NEG
Antibody Screen: NEGATIVE

## 2021-01-20 LAB — CBC WITH DIFFERENTIAL/PLATELET
Abs Immature Granulocytes: 0.01 10*3/uL (ref 0.00–0.07)
Basophils Absolute: 0 10*3/uL (ref 0.0–0.1)
Basophils Relative: 1 %
Eosinophils Absolute: 0.2 10*3/uL (ref 0.0–0.5)
Eosinophils Relative: 3 %
HCT: 41.7 % (ref 36.0–46.0)
Hemoglobin: 13.2 g/dL (ref 12.0–15.0)
Immature Granulocytes: 0 %
Lymphocytes Relative: 29 %
Lymphs Abs: 1.8 10*3/uL (ref 0.7–4.0)
MCH: 29.6 pg (ref 26.0–34.0)
MCHC: 31.7 g/dL (ref 30.0–36.0)
MCV: 93.5 fL (ref 80.0–100.0)
Monocytes Absolute: 0.7 10*3/uL (ref 0.1–1.0)
Monocytes Relative: 11 %
Neutro Abs: 3.6 10*3/uL (ref 1.7–7.7)
Neutrophils Relative %: 56 %
Platelets: 256 10*3/uL (ref 150–400)
RBC: 4.46 MIL/uL (ref 3.87–5.11)
RDW: 13.1 % (ref 11.5–15.5)
WBC: 6.2 10*3/uL (ref 4.0–10.5)
nRBC: 0 % (ref 0.0–0.2)

## 2021-01-20 LAB — HEMOGLOBIN A1C
Hgb A1c MFr Bld: 6.1 % — ABNORMAL HIGH (ref 4.8–5.6)
Mean Plasma Glucose: 128.37 mg/dL

## 2021-01-20 LAB — GLUCOSE, CAPILLARY: Glucose-Capillary: 108 mg/dL — ABNORMAL HIGH (ref 70–99)

## 2021-01-20 LAB — SURGICAL PCR SCREEN
MRSA, PCR: NEGATIVE
Staphylococcus aureus: NEGATIVE

## 2021-01-20 NOTE — Progress Notes (Signed)
Covid test 01/29/21  COVID Vaccine Completed:no   PCP - Dr. Andee Lineman  [facility] Cardiologist - Dr. Lenna Sciara. Allred LOV 12/02/20  Chest x-ray - no EKG - 12/02/20-epic Stress Test - 07/22/17-epic ECHO - 06/11/20-epic Cardiac Cath - 08/09/17 Pacemaker/ICD device last checked:NA  Sleep Study - yes CPAP - no She uses an oral device and will be retested again soon  Fasting Blood Sugar - 50-150 Checks Blood Sugar _____ times a day. Once a week  Blood Thinner Instructions:Eliquis/Dr. Allred Aspirin Instructions:Stop 3 days prior to DOS / Dr. Ronnie Derby Last Dose:01/29/21  Anesthesia review: yes  Patient denies shortness of breath, fever, cough and chest pain at PAT appointment No SOB with any activities. Uses her Inhalers daily  Patient verbalized understanding of instructions that were given to them at the PAT appointment. Patient was also instructed that they will need to review over the PAT instructions again at home before surgery. yes

## 2021-01-21 NOTE — Anesthesia Preprocedure Evaluation (Addendum)
Anesthesia Evaluation  Patient identified by MRN, date of birth, ID band Patient awake    Reviewed: Allergy & Precautions, NPO status , Patient's Chart, lab work & pertinent test results  Airway Mallampati: II  TM Distance: >3 FB Neck ROM: Full    Dental  (+) Teeth Intact   Pulmonary asthma , sleep apnea and Continuous Positive Airway Pressure Ventilation ,    Pulmonary exam normal        Cardiovascular hypertension, Pt. on medications and Pt. on home beta blockers +CHF  + dysrhythmias (s/p ablation 2019, on Eliquis ) Atrial Fibrillation  Rhythm:Irregular Rate:Normal     Neuro/Psych negative neurological ROS  negative psych ROS   GI/Hepatic Neg liver ROS, hiatal hernia, GERD  ,  Endo/Other  diabetes, Type 2, Oral Hypoglycemic Agents  Renal/GU negative Renal ROS  negative genitourinary   Musculoskeletal  (+) Arthritis , Osteoarthritis,  Fibromyalgia -  Abdominal (+)  Abdomen: soft. Bowel sounds: normal.  Peds  Hematology negative hematology ROS (+)   Anesthesia Other Findings   Reproductive/Obstetrics                           Anesthesia Physical Anesthesia Plan  ASA: 3  Anesthesia Plan: MAC, Regional and Spinal   Post-op Pain Management:  Regional for Post-op pain   Induction:   PONV Risk Score and Plan: 2 and Ondansetron, Dexamethasone, Propofol infusion, Midazolam and Treatment may vary due to age or medical condition  Airway Management Planned: Simple Face Mask, Natural Airway and Nasal Cannula  Additional Equipment: None  Intra-op Plan:   Post-operative Plan:   Informed Consent: I have reviewed the patients History and Physical, chart, labs and discussed the procedure including the risks, benefits and alternatives for the proposed anesthesia with the patient or authorized representative who has indicated his/her understanding and acceptance.     Dental advisory  given  Plan Discussed with: CRNA  Anesthesia Plan Comments: (See APP note by Durel Salts, FNP   ECHO 01/22: 1. Left ventricular ejection fraction, by estimation, is 60 to 65%. The  left ventricle has normal function. The left ventricle has no regional  wall motion abnormalities. Left ventricular diastolic parameters were  normal.  2. Right ventricular systolic function is normal. The right ventricular  size is normal. There is normal pulmonary artery systolic pressure.  3. Left atrial size was moderately dilated.  4. Cannot r/o PFO.  5. The mitral valve is degenerative. Trivial mitral valve regurgitation.  No evidence of mitral stenosis.  6. The aortic valve is tricuspid. Aortic valve regurgitation is not  visualized. Mild aortic valve sclerosis is present, with no evidence of  aortic valve stenosis.  7. The inferior vena cava is normal in size with greater than 50%  respiratory variability, suggesting right atrial pressure of 3 mmHg.   Lab Results      Component                Value               Date                      WBC                      6.2                 01/20/2021  HGB                      13.2                01/20/2021                HCT                      41.7                01/20/2021                MCV                      93.5                01/20/2021                PLT                      256                 01/20/2021           Lab Results      Component                Value               Date                      NA                       137                 01/20/2021                K                        4.9                 01/20/2021                CO2                      27                  01/20/2021                GLUCOSE                  97                  01/20/2021                BUN                      17                  01/20/2021                CREATININE               0.73                01/20/2021                 CALCIUM  9.0                 01/20/2021                GFRNONAA                 >60                 01/20/2021                GFRAA                    >60                 02/01/2020           )      Anesthesia Quick Evaluation

## 2021-01-21 NOTE — Progress Notes (Signed)
Anesthesia Chart Review:   Case: [id number] Date/Time: 02/02/21 0745   Procedure: TOTAL KNEE ARTHROPLASTY (Left: Knee)   Anesthesia type: Spinal   Pre-op diagnosis: Osteoarthritis of left knee M17.12   Location: [facility]   Surgeons: Vickey Huger, MD       DISCUSSION: Pt is 65 years old with hx atrial fibrillation (s/p ablatoin 2019), HTN, DM, OSA. S/p R TKA 10/20/20  Pt to stop eliquis 3 days before surgery   VS: BP (!) 148/75   Pulse 66   Temp 36.6 C (Oral)   Resp 20   Ht '5\' 5"'$  (1.651 m)   Wt 100.7 kg   SpO2 98%   BMI 36.94 kg/m   PROVIDERS: - Primary care at [facility], [place] cardiologist is [name], MD. Last office visit 12/02/20 with Roderic Palau, NP - Cardiologist is Daneen Schick, MD   LABS: Labs reviewed: Acceptable for surgery. (all labs ordered are listed, but only abnormal results are displayed)  Labs Reviewed  HEMOGLOBIN A1C - Abnormal; Notable for the following components:      Result Value   Hgb A1c MFr Bld 6.1 (*)    All other components within normal limits  GLUCOSE, CAPILLARY - Abnormal; Notable for the following components:   Glucose-Capillary 108 (*)    All other components within normal limits  SURGICAL PCR SCREEN  CBC WITH DIFFERENTIAL/PLATELET  COMPREHENSIVE METABOLIC PANEL  TYPE AND SCREEN    EKG 12/02/20:NSR   CV: Echo 06/11/20:  1. Left ventricular ejection fraction, by estimation, is 60 to 65%. The left ventricle has normal function. The left ventricle has no regional wall motion abnormalities. Left ventricular diastolic parameters were normal.   2. Right ventricular systolic function is normal. The right ventricular size is normal. There is normal pulmonary artery systolic pressure.   3. Left atrial size was moderately dilated.   4. Cannot r/o PFO.   5. The mitral valve is degenerative. Trivial mitral valve regurgitation.  No evidence of mitral stenosis.   6. The aortic valve is tricuspid. Aortic valve  regurgitation is not visualized. Mild aortic valve sclerosis is present, with no evidence of aortic valve stenosis.   7. The inferior vena cava is normal in size with greater than 50% respiratory variability, suggesting right atrial pressure of 3 mmHg.    R/L cardiac cath 08/09/17:  Normal left main. Normal left anterior descending coronary artery. Normal circumflex coronary artery. Normal dominant RCA. Normal LV size with normal hemodynamics and estimated ejection fraction of 50%. False positive myocardial perfusion imaging. Mild pulmonary hypertension with mean pressure of 27 mmHg. RECOMMENDATIONS: Management of atrial fibrillation with aggressive rate control and stroke prophylaxis.   Past Medical History:  Diagnosis Date   Asthma    Atrial fibrillation ([place])    Back pain    Diabetes mellitus without complication ([place])    Diastolic dysfunction    Dysrhythmia    a fib   Fibromyalgia    neuropathy feet   GERD (gastroesophageal reflux disease)    History of hiatal hernia    History of kidney stones    Hypertension    Knee pain    Osteoarthritis    Persistent atrial fibrillation ([facility])    Plantar fasciitis    Sleep apnea    wears oral appliance   Visit for monitoring Tikosyn therapy 02/07/2018    Past Surgical History:  Procedure Laterality Date   ABDOMINAL HYSTERECTOMY  2003   APPENDECTOMY  ATRIAL FIBRILLATION ABLATION N/A 03/09/2018   Procedure: ATRIAL FIBRILLATION ABLATION;  Surgeon: [name], MD;  Location: [place] [facility];  Service: Cardiovascular;  Laterality: N/A;   CARDIOVERSION N/A 07/04/2017   Procedure: CARDIOVERSION;  Surgeon: [name], MD;  Location: [facility]COPY;  Service: Cardiovascular;  Laterality: N/A;   CARDIOVERSION N/A 09/06/2017   Procedure: CARDIOVERSION;  Surgeon: [name], MD;  Location: [facility] ENDOSCOPY;  Service: Cardiovascular;  Laterality: N/A;   CHOLECYSTECTOMY N/A 02/24/2015   Procedure: LAPAROSCOPIC CHOLECYSTECTOMY;   Surgeon: [name], MD;  Location: [place];  Service: General;  Laterality: N/A;   EXCISION HAGLUND'S DEFORMITY WITH ACHILLES TENDON REPAIR Left 12/21/2018   Procedure: Left Achilles Tendon Debridement and Reconstruction; Excision of Haglund Deformity;  Surgeon: [name], MD;  Location: [place];  Service: Orthopedics;  Laterality: Left;   GASTROCNEMIUS RECESSION Left 12/21/2018   Procedure: Left Gastroc Recession;  Surgeon: [name], MD;  Location: [place];  Service: Orthopedics;  Laterality: Left;   RIGHT/LEFT HEART CATH AND CORONARY ANGIOGRAPHY N/A 08/09/2017   Procedure: RIGHT/LEFT HEART CATH AND CORONARY ANGIOGRAPHY;  Surgeon: [name], MD;  Location: [place] [facility] LAB;  Service: Cardiovascular;  Laterality: N/A;   TEE WITHOUT CARDIOVERSION N/A 07/04/2017   Procedure: TRANSESOPHAGEAL ECHOCARDIOGRAM (TEE);  Surgeon: [name], MD;  Location: [facility] ENDOSCOPY;  Service: Cardiovascular;  Laterality: N/A;   TEE WITHOUT CARDIOVERSION N/A 03/09/2018   Procedure: TRANSESOPHAGEAL ECHOCARDIOGRAM (TEE);  Surgeon: [name], MD;  Location: [facility] ENDOSCOPY;  Service: Cardiovascular;  Laterality: N/A;   TOTAL KNEE ARTHROPLASTY Right 10/20/2020   Procedure: TOTAL KNEE ARTHROPLASTY;  Surgeon: Vickey Huger, MD;  [facility]: [facility];  Service: Orthopedics;  Laterality: Right;   VAGINAL DELIVERY     x4    MEDICATIONS:  acidophilus (RISAQUAD) CAPS capsule   albuterol (VENTOLIN HFA) 108 (90 Base) MCG/ACT inhaler   apixaban (ELIQUIS) 5 MG TABS tablet   Ascorbic Acid (VITAMIN C PO)   diltiazem (CARDIZEM CD) 120 MG 24 hr capsule   dofetilide (TIKOSYN) 125 MCG capsule   Exenatide ER (BYDUREON BCISE) 2 MG/0.85ML AUIJ   fluticasone-salmeterol (ADVAIR) 100-50 MCG/ACT AEPB   furosemide (LASIX) 20 MG tablet   gabapentin (NEURONTIN) 300 MG capsule   lidocaine (LIDODERM) 5 %   Magnesium Gluconate 500 (27 Mg) MG TABS   metFORMIN (GLUCOPHAGE) 500 MG tablet    methocarbamol (ROBAXIN) 500 MG tablet   metoprolol succinate (TOPROL-XL) 50 MG 24 hr tablet   Multiple Vitamin (MULTIVITAMIN ADULT PO)   Multiple Vitamins-Minerals (MULTIVITAMIN ADULTS PO)   Multiple Vitamins-Minerals (WOMENS MULTIVITAMIN) TABS   potassium chloride (MICRO-K) 10 MEQ CR capsule   No current facility-administered medications for this encounter.   - Pt to stop eliquis 3 days before surgery  If no changes, I anticipate pt can proceed with surgery as scheduled.   Willeen Cass, PhD, FNP-BC [facility] Short Stay Surgical [facility]/Anesthesiology Phone: [phone] 01/21/2021 3:27 PM

## 2021-01-29 ENCOUNTER — Other Ambulatory Visit: Payer: Self-pay | Admitting: Orthopedic Surgery

## 2021-01-30 LAB — SARS CORONAVIRUS 2 (TAT 6-24 HRS): SARS Coronavirus 2: NEGATIVE

## 2021-02-01 MED ORDER — BUPIVACAINE LIPOSOME 1.3 % IJ SUSP
20.0000 mL | Freq: Once | INTRAMUSCULAR | Status: DC
  Filled 2021-02-01: qty 20

## 2021-02-02 ENCOUNTER — Encounter (HOSPITAL_COMMUNITY): Payer: Self-pay | Admitting: Orthopedic Surgery

## 2021-02-02 ENCOUNTER — Other Ambulatory Visit: Payer: Self-pay

## 2021-02-02 ENCOUNTER — Encounter (HOSPITAL_COMMUNITY)
Admission: RE | Disposition: A | Discharge status: Home / Self Care | Payer: Self-pay | Source: Ambulatory Visit | Attending: Orthopedic Surgery

## 2021-02-02 ENCOUNTER — Observation Stay (HOSPITAL_COMMUNITY)
Admission: RE | Admit: 2021-02-02 | Discharge: 2021-02-03 | Disposition: A | Discharge status: Home / Self Care | Payer: No Typology Code available for payment source | Source: Ambulatory Visit | Attending: Orthopedic Surgery | Admitting: Orthopedic Surgery

## 2021-02-02 ENCOUNTER — Ambulatory Visit (HOSPITAL_COMMUNITY): Payer: No Typology Code available for payment source | Admitting: Anesthesiology

## 2021-02-02 ENCOUNTER — Ambulatory Visit (HOSPITAL_COMMUNITY): Payer: No Typology Code available for payment source | Admitting: Emergency Medicine

## 2021-02-02 DIAGNOSIS — Z96651 Presence of right artificial knee joint: Secondary | ICD-10-CM | POA: Insufficient documentation

## 2021-02-02 DIAGNOSIS — J45909 Unspecified asthma, uncomplicated: Secondary | ICD-10-CM | POA: Diagnosis not present

## 2021-02-02 DIAGNOSIS — Z79899 Other long term (current) drug therapy: Secondary | ICD-10-CM | POA: Diagnosis not present

## 2021-02-02 DIAGNOSIS — Z7984 Long term (current) use of oral hypoglycemic drugs: Secondary | ICD-10-CM | POA: Diagnosis not present

## 2021-02-02 DIAGNOSIS — M1712 Unilateral primary osteoarthritis, left knee: Secondary | ICD-10-CM | POA: Diagnosis present

## 2021-02-02 DIAGNOSIS — I5032 Chronic diastolic (congestive) heart failure: Secondary | ICD-10-CM | POA: Diagnosis not present

## 2021-02-02 DIAGNOSIS — Z7901 Long term (current) use of anticoagulants: Secondary | ICD-10-CM | POA: Insufficient documentation

## 2021-02-02 DIAGNOSIS — I110 Hypertensive heart disease with heart failure: Secondary | ICD-10-CM | POA: Diagnosis not present

## 2021-02-02 DIAGNOSIS — E119 Type 2 diabetes mellitus without complications: Secondary | ICD-10-CM | POA: Insufficient documentation

## 2021-02-02 HISTORY — PX: TOTAL KNEE ARTHROPLASTY: SHX125

## 2021-02-02 LAB — ABO/RH: ABO/RH(D): A NEG

## 2021-02-02 LAB — GLUCOSE, CAPILLARY
Glucose-Capillary: 92 mg/dL (ref 70–99)
Glucose-Capillary: 111 mg/dL — ABNORMAL HIGH (ref 70–99)

## 2021-02-02 SURGERY — ARTHROPLASTY, KNEE, TOTAL
Anesthesia: Monitor Anesthesia Care | Site: Knee | Laterality: Left

## 2021-02-02 MED ORDER — PANTOPRAZOLE SODIUM 40 MG PO TBEC
40.0000 mg | DELAYED_RELEASE_TABLET | Freq: Every day | ORAL | Status: DC
  Administered 2021-02-02 – 2021-02-03 (×2): 40 mg via ORAL
  Filled 2021-02-02 (×2): qty 1

## 2021-02-02 MED ORDER — MOMETASONE FURO-FORMOTEROL FUM 100-5 MCG/ACT IN AERO
2.0000 | INHALATION_SPRAY | Freq: Two times a day (BID) | RESPIRATORY_TRACT | Status: DC
  Filled 2021-02-02: qty 8.8

## 2021-02-02 MED ORDER — 0.9 % SODIUM CHLORIDE (POUR BTL) OPTIME
TOPICAL | Status: DC | PRN
  Administered 2021-02-02: 1000 mL

## 2021-02-02 MED ORDER — DOCUSATE SODIUM 100 MG PO CAPS
100.0000 mg | ORAL_CAPSULE | Freq: Two times a day (BID) | ORAL | Status: DC
  Administered 2021-02-02 – 2021-02-03 (×3): 100 mg via ORAL
  Filled 2021-02-02 (×3): qty 1

## 2021-02-02 MED ORDER — ONDANSETRON HCL 4 MG/2ML IJ SOLN: 4.0000 mg | Freq: Four times a day (QID) | INTRAMUSCULAR | Status: DC | PRN

## 2021-02-02 MED ORDER — GLYCOPYRROLATE PF 0.2 MG/ML IJ SOSY
PREFILLED_SYRINGE | INTRAMUSCULAR | Status: DC | PRN
  Administered 2021-02-02: .2 mg via INTRAVENOUS

## 2021-02-02 MED ORDER — BUPIVACAINE-EPINEPHRINE 0.25% -1:200000 IJ SOLN
INTRAMUSCULAR | Status: AC
  Filled 2021-02-02: qty 1

## 2021-02-02 MED ORDER — FLEET ENEMA 7-19 GM/118ML RE ENEM: 1.0000 | ENEMA | Freq: Once | RECTAL | Status: DC | PRN

## 2021-02-02 MED ORDER — DEXAMETHASONE SODIUM PHOSPHATE 10 MG/ML IJ SOLN
INTRAMUSCULAR | Status: DC | PRN
  Administered 2021-02-02: 5 mg

## 2021-02-02 MED ORDER — ACETAMINOPHEN 500 MG PO TABS
1000.0000 mg | ORAL_TABLET | Freq: Four times a day (QID) | ORAL | Status: AC
  Administered 2021-02-02 (×3): 1000 mg via ORAL
  Filled 2021-02-02 (×4): qty 2

## 2021-02-02 MED ORDER — BUPIVACAINE LIPOSOME 1.3 % IJ SUSP
INTRAMUSCULAR | Status: DC | PRN
  Administered 2021-02-02: 20 mL

## 2021-02-02 MED ORDER — SODIUM CHLORIDE 0.9 % IV SOLN: INTRAVENOUS | Status: DC

## 2021-02-02 MED ORDER — GABAPENTIN 300 MG PO CAPS
300.0000 mg | ORAL_CAPSULE | Freq: Once | ORAL | Status: AC
  Administered 2021-02-02: 300 mg via ORAL
  Filled 2021-02-02: qty 1

## 2021-02-02 MED ORDER — METOCLOPRAMIDE HCL 5 MG/ML IJ SOLN
5.0000 mg | Freq: Three times a day (TID) | INTRAMUSCULAR | Status: DC | PRN
  Administered 2021-02-03: 10 mg via INTRAVENOUS
  Filled 2021-02-02: qty 2

## 2021-02-02 MED ORDER — PHENOL 1.4 % MT LIQD: 1.0000 | OROMUCOSAL | Status: DC | PRN

## 2021-02-02 MED ORDER — ORAL CARE MOUTH RINSE
15.0000 mL | Freq: Once | OROMUCOSAL | Status: AC
  Administered 2021-02-02: 15 mL via OROMUCOSAL

## 2021-02-02 MED ORDER — FENTANYL CITRATE PF 50 MCG/ML IJ SOSY
50.0000 ug | PREFILLED_SYRINGE | INTRAMUSCULAR | Status: DC
  Administered 2021-02-02: 50 ug via INTRAVENOUS
  Filled 2021-02-02: qty 2

## 2021-02-02 MED ORDER — PROPOFOL 1000 MG/100ML IV EMUL
INTRAVENOUS | Status: AC
  Filled 2021-02-02: qty 100

## 2021-02-02 MED ORDER — BUPIVACAINE IN DEXTROSE 0.75-8.25 % IT SOLN
INTRATHECAL | Status: DC | PRN
  Administered 2021-02-02: 1.6 mL via INTRATHECAL

## 2021-02-02 MED ORDER — DILTIAZEM HCL ER COATED BEADS 120 MG PO CP24
120.0000 mg | ORAL_CAPSULE | Freq: Every day | ORAL | Status: DC
  Administered 2021-02-03: 120 mg via ORAL
  Filled 2021-02-02: qty 1

## 2021-02-02 MED ORDER — FENTANYL CITRATE (PF) 100 MCG/2ML IJ SOLN
INTRAMUSCULAR | Status: DC | PRN
  Administered 2021-02-02: 25 ug via INTRAVENOUS

## 2021-02-02 MED ORDER — FUROSEMIDE 20 MG PO TABS: 20.0000 mg | ORAL_TABLET | Freq: Every day | ORAL | Status: DC | PRN

## 2021-02-02 MED ORDER — ROPIVACAINE HCL 5 MG/ML IJ SOLN
INTRAMUSCULAR | Status: DC | PRN
  Administered 2021-02-02: 20 mL via PERINEURAL

## 2021-02-02 MED ORDER — SODIUM CHLORIDE 0.9% FLUSH
INTRAVENOUS | Status: DC | PRN
  Administered 2021-02-02: 20 mL

## 2021-02-02 MED ORDER — HYDROMORPHONE HCL 1 MG/ML IJ SOLN
0.5000 mg | INTRAMUSCULAR | Status: DC | PRN
  Administered 2021-02-02: 1 mg via INTRAVENOUS
  Filled 2021-02-02: qty 1

## 2021-02-02 MED ORDER — DOFETILIDE 125 MCG PO CAPS
125.0000 ug | ORAL_CAPSULE | Freq: Two times a day (BID) | ORAL | Status: DC
  Administered 2021-02-02 – 2021-02-03 (×2): 125 ug via ORAL
  Filled 2021-02-02 (×3): qty 1

## 2021-02-02 MED ORDER — OXYCODONE HCL 5 MG PO TABS
5.0000 mg | ORAL_TABLET | ORAL | Status: DC | PRN
  Administered 2021-02-02 – 2021-02-03 (×6): 10 mg via ORAL
  Filled 2021-02-02 (×6): qty 2

## 2021-02-02 MED ORDER — FENTANYL CITRATE (PF) 100 MCG/2ML IJ SOLN
INTRAMUSCULAR | Status: AC
  Filled 2021-02-02: qty 2

## 2021-02-02 MED ORDER — POTASSIUM CHLORIDE CRYS ER 10 MEQ PO TBCR
10.0000 meq | EXTENDED_RELEASE_TABLET | Freq: Every day | ORAL | Status: DC
  Administered 2021-02-03: 10 meq via ORAL
  Filled 2021-02-02: qty 1

## 2021-02-02 MED ORDER — ALBUTEROL SULFATE (2.5 MG/3ML) 0.083% IN NEBU: 3.0000 mL | INHALATION_SOLUTION | RESPIRATORY_TRACT | Status: DC | PRN

## 2021-02-02 MED ORDER — FENTANYL CITRATE PF 50 MCG/ML IJ SOSY
PREFILLED_SYRINGE | INTRAMUSCULAR | Status: AC
  Filled 2021-02-02: qty 1

## 2021-02-02 MED ORDER — TRANEXAMIC ACID-NACL 1000-0.7 MG/100ML-% IV SOLN
1000.0000 mg | INTRAVENOUS | Status: DC
  Filled 2021-02-02: qty 100

## 2021-02-02 MED ORDER — MENTHOL 3 MG MT LOZG: 1.0000 | LOZENGE | OROMUCOSAL | Status: DC | PRN

## 2021-02-02 MED ORDER — ALUM & MAG HYDROXIDE-SIMETH 200-200-20 MG/5ML PO SUSP: 30.0000 mL | ORAL | Status: DC | PRN

## 2021-02-02 MED ORDER — PROPOFOL 500 MG/50ML IV EMUL
INTRAVENOUS | Status: DC | PRN
  Administered 2021-02-02: 40 ug/kg/min via INTRAVENOUS

## 2021-02-02 MED ORDER — PROPOFOL 10 MG/ML IV BOLUS
INTRAVENOUS | Status: DC | PRN
  Administered 2021-02-02: 15 mg via INTRAVENOUS
  Administered 2021-02-02: 20 mg via INTRAVENOUS
  Administered 2021-02-02: 30 mg via INTRAVENOUS

## 2021-02-02 MED ORDER — PHENYLEPHRINE HCL-NACL 20-0.9 MG/250ML-% IV SOLN
INTRAVENOUS | Status: DC | PRN
  Administered 2021-02-02: 20 ug/min via INTRAVENOUS

## 2021-02-02 MED ORDER — FENTANYL CITRATE PF 50 MCG/ML IJ SOSY
25.0000 ug | PREFILLED_SYRINGE | INTRAMUSCULAR | Status: DC | PRN
  Administered 2021-02-02 (×2): 25 ug via INTRAVENOUS

## 2021-02-02 MED ORDER — ONDANSETRON HCL 4 MG PO TABS: 4.0000 mg | ORAL_TABLET | Freq: Four times a day (QID) | ORAL | Status: DC | PRN

## 2021-02-02 MED ORDER — BUPIVACAINE-EPINEPHRINE (PF) 0.25% -1:200000 IJ SOLN
INTRAMUSCULAR | Status: AC
  Filled 2021-02-02: qty 30

## 2021-02-02 MED ORDER — PHENYLEPHRINE HCL (PRESSORS) 10 MG/ML IV SOLN
INTRAVENOUS | Status: AC
  Filled 2021-02-02: qty 2

## 2021-02-02 MED ORDER — WATER FOR IRRIGATION, STERILE IR SOLN
Status: DC | PRN
  Administered 2021-02-02: 2000 mL

## 2021-02-02 MED ORDER — METOCLOPRAMIDE HCL 5 MG PO TABS: 5.0000 mg | ORAL_TABLET | Freq: Three times a day (TID) | ORAL | Status: DC | PRN

## 2021-02-02 MED ORDER — DEXAMETHASONE SODIUM PHOSPHATE 10 MG/ML IJ SOLN
10.0000 mg | Freq: Once | INTRAMUSCULAR | Status: AC
  Administered 2021-02-03: 10 mg via INTRAVENOUS
  Filled 2021-02-02: qty 1

## 2021-02-02 MED ORDER — DEXAMETHASONE SODIUM PHOSPHATE 10 MG/ML IJ SOLN: 8.0000 mg | Freq: Once | INTRAMUSCULAR | Status: DC

## 2021-02-02 MED ORDER — GABAPENTIN 300 MG PO CAPS
300.0000 mg | ORAL_CAPSULE | Freq: Two times a day (BID) | ORAL | Status: DC
  Administered 2021-02-02: 300 mg via ORAL
  Filled 2021-02-02: qty 1

## 2021-02-02 MED ORDER — MIDAZOLAM HCL 5 MG/5ML IJ SOLN
INTRAMUSCULAR | Status: DC | PRN
  Administered 2021-02-02: 1 mg via INTRAVENOUS

## 2021-02-02 MED ORDER — APIXABAN 5 MG PO TABS
5.0000 mg | ORAL_TABLET | Freq: Two times a day (BID) | ORAL | Status: DC
  Administered 2021-02-03: 5 mg via ORAL
  Filled 2021-02-02: qty 1

## 2021-02-02 MED ORDER — GABAPENTIN 300 MG PO CAPS
300.0000 mg | ORAL_CAPSULE | Freq: Three times a day (TID) | ORAL | Status: DC
  Administered 2021-02-02 – 2021-02-03 (×3): 300 mg via ORAL
  Filled 2021-02-02 (×3): qty 1

## 2021-02-02 MED ORDER — SODIUM CHLORIDE (PF) 0.9 % IJ SOLN
INTRAMUSCULAR | Status: AC
  Filled 2021-02-02: qty 20

## 2021-02-02 MED ORDER — BUPIVACAINE-EPINEPHRINE 0.25% -1:200000 IJ SOLN
INTRAMUSCULAR | Status: DC | PRN
  Administered 2021-02-02: 30 mL

## 2021-02-02 MED ORDER — POVIDONE-IODINE 10 % EX SWAB
2.0000 "application " | Freq: Once | CUTANEOUS | Status: AC
  Administered 2021-02-02: 2 via TOPICAL

## 2021-02-02 MED ORDER — PROMETHAZINE HCL 25 MG/ML IJ SOLN: 6.2500 mg | INTRAMUSCULAR | Status: DC | PRN

## 2021-02-02 MED ORDER — METOPROLOL SUCCINATE ER 50 MG PO TB24: 50.0000 mg | ORAL_TABLET | Freq: Every day | ORAL | Status: DC | PRN

## 2021-02-02 MED ORDER — MIDAZOLAM HCL 2 MG/2ML IJ SOLN
INTRAMUSCULAR | Status: AC
  Filled 2021-02-02: qty 2

## 2021-02-02 MED ORDER — FERROUS SULFATE 325 (65 FE) MG PO TABS
325.0000 mg | ORAL_TABLET | Freq: Three times a day (TID) | ORAL | Status: DC
  Administered 2021-02-02 – 2021-02-03 (×3): 325 mg via ORAL
  Filled 2021-02-02 (×3): qty 1

## 2021-02-02 MED ORDER — MIDAZOLAM HCL 2 MG/2ML IJ SOLN
1.0000 mg | INTRAMUSCULAR | Status: DC
  Administered 2021-02-02: 2 mg via INTRAVENOUS
  Filled 2021-02-02: qty 2

## 2021-02-02 MED ORDER — METFORMIN HCL 500 MG PO TABS
500.0000 mg | ORAL_TABLET | Freq: Every day | ORAL | Status: DC
  Administered 2021-02-03: 500 mg via ORAL
  Filled 2021-02-02: qty 1

## 2021-02-02 MED ORDER — ACETAMINOPHEN 500 MG PO TABS
1000.0000 mg | ORAL_TABLET | Freq: Once | ORAL | Status: AC
  Administered 2021-02-02: 1000 mg via ORAL
  Filled 2021-02-02: qty 2

## 2021-02-02 MED ORDER — ZOLPIDEM TARTRATE 5 MG PO TABS: 5.0000 mg | ORAL_TABLET | Freq: Every evening | ORAL | Status: DC | PRN

## 2021-02-02 MED ORDER — CEFAZOLIN SODIUM-DEXTROSE 2-4 GM/100ML-% IV SOLN
2.0000 g | INTRAVENOUS | Status: DC
  Filled 2021-02-02: qty 100

## 2021-02-02 MED ORDER — CHLORHEXIDINE GLUCONATE 0.12 % MT SOLN: 15.0000 mL | Freq: Once | OROMUCOSAL | Status: AC

## 2021-02-02 MED ORDER — ONDANSETRON HCL 4 MG/2ML IJ SOLN
INTRAMUSCULAR | Status: DC | PRN
  Administered 2021-02-02: 4 mg via INTRAVENOUS

## 2021-02-02 MED ORDER — ONDANSETRON HCL 4 MG/2ML IJ SOLN
INTRAMUSCULAR | Status: AC
  Filled 2021-02-02: qty 2

## 2021-02-02 MED ORDER — DIPHENHYDRAMINE HCL 12.5 MG/5ML PO ELIX: 12.5000 mg | ORAL_SOLUTION | ORAL | Status: DC | PRN

## 2021-02-02 MED ORDER — LACTATED RINGERS IV SOLN: INTRAVENOUS | Status: DC

## 2021-02-02 MED ORDER — DEXAMETHASONE SODIUM PHOSPHATE 10 MG/ML IJ SOLN
INTRAMUSCULAR | Status: AC
  Filled 2021-02-02: qty 1

## 2021-02-02 MED ORDER — BISACODYL 5 MG PO TBEC: 5.0000 mg | DELAYED_RELEASE_TABLET | Freq: Every day | ORAL | Status: DC | PRN

## 2021-02-02 MED ORDER — SENNOSIDES-DOCUSATE SODIUM 8.6-50 MG PO TABS: 1.0000 | ORAL_TABLET | Freq: Every evening | ORAL | Status: DC | PRN

## 2021-02-02 SURGICAL SUPPLY — 53 items
ARTISURF 10M PLY L 6-9CD KNEE (Knees) ×2 IMPLANT
AUG FEM CMT STD PS 7 LT (Joint) ×2 IMPLANT
AUGMENT FEM CMT STD PS 7 LT (Joint) ×1 IMPLANT
BAG COUNTER SPONGE SURGICOUNT (BAG) IMPLANT
BAG ZIPLOCK 12X15 (MISCELLANEOUS) ×2 IMPLANT
BLADE SAGITTAL 13X1.27X60 (BLADE) ×2 IMPLANT
BLADE SAW SGTL 18X1.27X75 (BLADE) ×2 IMPLANT
BLADE SURG 15 STRL LF DISP TIS (BLADE) ×1 IMPLANT
BLADE SURG 15 STRL SS (BLADE) ×2
BLADE SURG SZ10 CARB STEEL (BLADE) ×4 IMPLANT
BNDG ELASTIC 6X5.8 VLCR STR LF (GAUZE/BANDAGES/DRESSINGS) ×2 IMPLANT
BOWL SMART MIX CTS (DISPOSABLE) ×2 IMPLANT
CEMENT BONE SIMPLEX SPEEDSET (Cement) ×4 IMPLANT
COVER SURGICAL LIGHT HANDLE (MISCELLANEOUS) ×2 IMPLANT
CUFF TOURN SGL QUICK 34 (TOURNIQUET CUFF) ×2
CUFF TRNQT CYL 34X4.125X (TOURNIQUET CUFF) ×1 IMPLANT
DECANTER SPIKE VIAL GLASS SM (MISCELLANEOUS) ×4 IMPLANT
DRAPE INCISE IOBAN 66X45 STRL (DRAPES) ×4 IMPLANT
DRAPE U-SHAPE 47X51 STRL (DRAPES) ×2 IMPLANT
DRSG AQUACEL AG ADV 3.5X10 (GAUZE/BANDAGES/DRESSINGS) ×2 IMPLANT
DURAPREP 26ML APPLICATOR (WOUND CARE) ×4 IMPLANT
ELECT REM PT RETURN 15FT ADLT (MISCELLANEOUS) ×2 IMPLANT
GLOVE SURG ENC TEXT LTX SZ7.5 (GLOVE) ×2 IMPLANT
GLOVE SURG ORTHO LTX SZ8 (GLOVE) ×6 IMPLANT
GLOVE SURG UNDER POLY LF SZ7.5 (GLOVE) ×2 IMPLANT
GLOVE SURG UNDER POLY LF SZ8.5 (GLOVE) ×4 IMPLANT
GOWN STRL REUS W/ TWL XL LVL3 (GOWN DISPOSABLE) ×2 IMPLANT
GOWN STRL REUS W/TWL XL LVL3 (GOWN DISPOSABLE) ×4
HANDPIECE INTERPULSE COAX TIP (DISPOSABLE) ×2
HOLDER FOLEY CATH W/STRAP (MISCELLANEOUS) ×2 IMPLANT
HOOD PEEL AWAY FLYTE STAYCOOL (MISCELLANEOUS) ×6 IMPLANT
KIT TURNOVER KIT A (KITS) ×2 IMPLANT
MANIFOLD NEPTUNE II (INSTRUMENTS) ×2 IMPLANT
NEEDLE HYPO 22GX1.5 SAFETY (NEEDLE) ×2 IMPLANT
NS IRRIG 1000ML POUR BTL (IV SOLUTION) ×2 IMPLANT
PACK TOTAL KNEE CUSTOM (KITS) ×2 IMPLANT
PENCIL SMOKE EVACUATOR (MISCELLANEOUS) ×2 IMPLANT
PROTECTOR NERVE ULNAR (MISCELLANEOUS) ×2 IMPLANT
SET HNDPC FAN SPRY TIP SCT (DISPOSABLE) ×1 IMPLANT
STEM POLY PAT PLY 32M KNEE (Knees) ×2 IMPLANT
STEM TIBIA 5 DEG SZ D L KNEE (Knees) ×1 IMPLANT
STRIP CLOSURE SKIN 1/2X4 (GAUZE/BANDAGES/DRESSINGS) ×2 IMPLANT
SUT BONE WAX W31G (SUTURE) ×2 IMPLANT
SUT MNCRL AB 3-0 PS2 18 (SUTURE) ×2 IMPLANT
SUT STRATAFIX 0 PDS 27 VIOLET (SUTURE) ×2
SUT STRATAFIX PDS+ 0 24IN (SUTURE) ×2 IMPLANT
SUT VIC AB 1 CT1 36 (SUTURE) ×2 IMPLANT
SUTURE STRATFX 0 PDS 27 VIOLET (SUTURE) ×1 IMPLANT
SYR 30ML LL (SYRINGE) ×4 IMPLANT
TIBIA STEM 5 DEG SZ D L KNEE (Knees) ×2 IMPLANT
TRAY FOLEY MTR SLVR 16FR STAT (SET/KITS/TRAYS/PACK) ×2 IMPLANT
WATER STERILE IRR 1000ML POUR (IV SOLUTION) ×4 IMPLANT
WRAP KNEE MAXI GEL POST OP (GAUZE/BANDAGES/DRESSINGS) ×2 IMPLANT

## 2021-02-02 NOTE — H&P (Signed)
Christie Atkinson MRN:  [id number] DOB/SEX:  January 03, 1956/female  CHIEF COMPLAINT:  Painful left Knee  HISTORY: Patient is a 64 y.o. female presented with a history of pain in the left knee. Onset of symptoms was gradual starting a few years ago with gradually worsening course since that time. Patient has been treated conservatively with over-the-counter NSAIDs and activity modification. Patient currently rates pain in the knee at 10 out of 10 with activity. There is pain at night.  PAST MEDICAL HISTORY: Patient Active Problem List   Diagnosis Date Noted   S/P total knee replacement 10/20/2020   Persistent atrial fibrillation ([place]) 03/09/2018   Chronic diastolic CHF (congestive heart failure) ([place]) 02/11/2018   Visit for monitoring Tikosyn therapy 02/07/2018   Abnormal nuclear stress test 08/08/2017   Other persistent atrial fibrillation ([place])    Morbid obesity ([place]) 06/29/2017   Asthma 06/29/2017   Fibromyalgia 06/29/2017   Congestive heart failure ([place])    Preoperative cardiovascular examination 02/23/2015   Atrial fibrillation with RVR ([place]) 02/23/2015   Elevated blood pressure reading without diagnosis of hypertension 02/23/2015   Abdominal pain    Calculus of gallbladder with biliary obstruction but without cholecystitis    Symptomatic cholelithiasis    Cholelithiasis 02/22/2015   Past Medical History:  Diagnosis Date   Asthma    Atrial fibrillation ([place])    Back pain    Diabetes mellitus without complication ([place])    Diastolic dysfunction    Dysrhythmia    a fib   Fibromyalgia    neuropathy feet   GERD (gastroesophageal reflux disease)    History of hiatal hernia    History of kidney stones    Hypertension    Knee pain    Osteoarthritis    Persistent atrial fibrillation (HCC)    Plantar fasciitis    Sleep apnea    wears oral appliance   Visit for monitoring Tikosyn therapy 02/07/2018   Past Surgical History:  Procedure Laterality Date   ABDOMINAL HYSTERECTOMY   2003   APPENDECTOMY     ATRIAL FIBRILLATION ABLATION N/A 03/09/2018   Procedure: ATRIAL FIBRILLATION ABLATION;  Surgeon: [name], MD;  Location: [place] [facility];  Service: Cardiovascular;  Laterality: N/A;   CARDIOVERSION N/A 07/04/2017   Procedure: CARDIOVERSION;  Surgeon: [name], MD;  Location: [facility] ENDOSCOPY;  Service: Cardiovascular;  Laterality: N/A;   CARDIOVERSION N/A 09/06/2017   Procedure: CARDIOVERSION;  Surgeon: [name], MD;  Location: [facility] ENDOSCOPY;  Service: Cardiovascular;  Laterality: N/A;   CHOLECYSTECTOMY N/A 02/24/2015   Procedure: LAPAROSCOPIC CHOLECYSTECTOMY;  Surgeon: [name], MD;  Location: [place];  Service: General;  Laterality: N/A;   EXCISION HAGLUND'S DEFORMITY WITH ACHILLES TENDON REPAIR Left 12/21/2018   Procedure: Left Achilles Tendon Debridement and Reconstruction; Excision of Haglund Deformity;  Surgeon: [name], MD;  Location: [place];  Service: Orthopedics;  Laterality: Left;   GASTROCNEMIUS RECESSION Left 12/21/2018   Procedure: Left Gastroc Recession;  Surgeon: [name], MD;  Location: [place];  Service: Orthopedics;  Laterality: Left;   RIGHT/LEFT HEART CATH AND CORONARY ANGIOGRAPHY N/A 08/09/2017   Procedure: RIGHT/LEFT HEART CATH AND CORONARY ANGIOGRAPHY;  Surgeon: [name], MD;  Location: [place] [facility] LAB;  Service: Cardiovascular;  Laterality: N/A;   TEE WITHOUT CARDIOVERSION N/A 07/04/2017   Procedure: TRANSESOPHAGEAL ECHOCARDIOGRAM (TEE);  Surgeon: [name], MD;  Location: [facility] ENDOSCOPY;  Service: Cardiovascular;  Laterality: N/A;   TEE WITHOUT CARDIOVERSION N/A 03/09/2018  Procedure: TRANSESOPHAGEAL ECHOCARDIOGRAM (TEE);  Surgeon: [name], MD;  Location: [facility] ENDOSCOPY;  Service: Cardiovascular;  Laterality: N/A;   TOTAL KNEE ARTHROPLASTY Right 10/20/2020   Procedure: TOTAL KNEE ARTHROPLASTY;  Surgeon: Vickey Huger, MD;  [facility];  Service: Orthopedics;   Laterality: Right;   VAGINAL DELIVERY     x4     MEDICATIONS:   Medications Prior to Admission  Medication Sig Dispense Refill Last Dose   acidophilus (RISAQUAD) CAPS capsule Take 1 capsule by mouth daily as needed (intergestion).   Past Week   albuterol (VENTOLIN HFA) 108 (90 Base) MCG/ACT inhaler Inhale 2 puffs into the lungs every 4 (four) hours as needed for wheezing or shortness of breath.   02/01/2021   apixaban (ELIQUIS) 5 MG TABS tablet Take 1 tablet (5 mg total) by mouth 2 (two) times daily. 180 tablet 3 01/29/2021   Ascorbic Acid (VITAMIN C PO) Take 1 tablet by mouth daily.   Past Week   diltiazem (CARDIZEM CD) 120 MG 24 hr capsule Take 2 tablets in the AM and 1 tablet in the PM 270 capsule 2 02/02/2021 at 0430   dofetilide (TIKOSYN) 125 MCG capsule Take 1 capsule (125 mcg total) by mouth 2 (two) times daily. 180 capsule 1 02/02/2021 at 0430   Exenatide ER (BYDUREON BCISE) 2 MG/0.85ML AUIJ Inject 2 mg into the skin once a week.   Past Week   furosemide (LASIX) 20 MG tablet Take 1 tablet (20 mg total) by mouth 2 (two) times daily. (Patient taking differently: Take 20 mg by mouth daily as needed for fluid or edema.) 30 tablet 3 02/01/2021   gabapentin (NEURONTIN) 300 MG capsule Take 1 capsule (300 mg total) by mouth 3 (three) times daily. (Patient taking differently: Take 300 mg by mouth 2 (two) times daily.) 30 capsule 0 Past Week   lidocaine (LIDODERM) 5 % Place 1 patch onto the skin daily. Remove & Discard patch within 12 hours or as directed by MD   Past Week   Magnesium Gluconate 500 (27 Mg) MG TABS Take 1 tablet (500 mg total) by mouth daily. (Patient taking differently: Take 1 tablet by mouth at bedtime.) 30 tablet 0 02/01/2021   metFORMIN (GLUCOPHAGE) 500 MG tablet Take 1 tablet (500 mg total) by mouth 2 (two) times daily with a meal. (Patient taking differently: Take 500 mg by mouth daily with breakfast. Watch carbs) 180 tablet 0 02/01/2021   methocarbamol (ROBAXIN) 500 MG tablet Take  1-2 tablets (500-1,000 mg total) by mouth every 6 (six) hours as needed for muscle spasms. 60 tablet 0 02/01/2021   Multiple Vitamin (MULTIVITAMIN ADULT PO) Take by mouth. Taking Copaiba-one tablet by mouth daily   Past Week   Multiple Vitamins-Minerals (MULTIVITAMIN ADULTS PO) Take by mouth. [place] - Taking one tablet by mouth as needed for sleep   Past Week   Multiple Vitamins-Minerals (WOMENS MULTIVITAMIN) TABS Take 1 tablet by mouth daily. 90 tablet 0 Past Week   potassium chloride (MICRO-K) 10 MEQ CR capsule Take 10 mEq by mouth 2 (two) times daily.   Past Week   fluticasone-salmeterol (ADVAIR) 100-50 MCG/ACT AEPB Inhale 1 puff into the lungs 2 (two) times daily.   Unknown   metoprolol succinate (TOPROL-XL) 50 MG 24 hr tablet Take 1 tablet (50 mg total) by mouth as needed. Take with or immediately following a meal. (Patient taking differently: Take 50 mg by mouth daily as needed (breathing difficulty). Take with or immediately following a meal.)   More  than a month    ALLERGIES:   Allergies  Allergen Reactions   Nickel Hives and Itching   Elemental Sulfur Hives   Hydrocodone-Acetaminophen Nausea And Vomiting   Other Other (See Comments)    Polyester - including hospital gowns and sheets - allergic contact dermatitis    REVIEW OF SYSTEMS:  A comprehensive review of systems was negative except for: Musculoskeletal: positive for arthralgias and bone pain   FAMILY HISTORY:   Family History  Problem Relation Age of Onset   Heart disease Mother    Hypertension Mother    Hyperlipidemia Mother    Diabetes Mother    Sudden death Mother    Thyroid disease Mother    Obesity Mother    Heart disease Father    Heart attack Father    Hypertension Father    Hyperlipidemia Father    Diabetes Father    Allergic rhinitis Son    Eczema Son    Allergic rhinitis Daughter    Eczema Daughter    Angioedema Neg Hx    Asthma Neg Hx    Immunodeficiency Neg Hx     SOCIAL HISTORY:   Social  History   Tobacco Use   Smoking status: Never    Passive exposure: Yes   Smokeless tobacco: Never  Substance Use Topics   Alcohol use: Yes    Alcohol/week: 2.0 standard drinks    Types: 2 Standard drinks or equivalent per week    Comment: little     EXAMINATION:  Vital signs in last 24 hours: Temp:  [98.4 F (36.9 C)] 98.4 F (36.9 C) (08/29 0648) Pulse Rate:  [71] 71 (08/29 0648) Resp:  [16] 16 (08/29 0648) BP: (139)/(86) 139/86 (08/29 0648) SpO2:  [97 %] 97 % (08/29 0648)  BP 139/86   Pulse 71   Temp 98.4 F (36.9 C) (Oral)   Resp 16   SpO2 97%   General Appearance:    Alert, cooperative, no distress, appears stated age  Head:    Normocephalic, without obvious abnormality, atraumatic  Eyes:    PERRL, conjunctiva/corneas clear, EOM's intact, fundi    benign, both eyes  Ears:    Normal TM's and external ear canals, both ears  Nose:   Nares normal, septum midline, mucosa normal, no drainage    or sinus tenderness  Throat:   Lips, mucosa, and tongue normal; teeth and gums normal  Neck:   Supple, symmetrical, trachea midline, no adenopathy;    thyroid:  no enlargement/tenderness/nodules; no carotid   bruit or JVD  Back:     Symmetric, no curvature, ROM normal, no CVA tenderness  Lungs:     Clear to auscultation bilaterally, respirations unlabored  Chest Wall:    No tenderness or deformity   Heart:    Regular rate and rhythm, S1 and S2 normal, no murmur, rub   or gallop  Breast Exam:    No tenderness, masses, or nipple abnormality  Abdomen:     Soft, non-tender, bowel sounds active all four quadrants,    no masses, no organomegaly  Genitalia:    Normal female without lesion, discharge or tenderness  Rectal:    Normal tone,no masses or tenderness;   guaiac negative stool  Extremities:   Extremities normal, atraumatic, no cyanosis or edema  Pulses:   2+ and symmetric all extremities  Skin:   Skin color, texture, turgor normal, no rashes or lesions  Lymph nodes:    Cervical, supraclavicular, and axillary nodes normal  Neurologic:  CNII-XII intact, normal strength, sensation and reflexes    throughout    Musculoskeletal:  ROM 0-120, Ligaments intact,  Imaging Review Plain radiographs demonstrate severe degenerative joint disease of the left knee. The overall alignment is neutral. The bone quality appears to be good for age and reported activity level.  Assessment/Plan: Primary osteoarthritis, left knee   The patient history, physical examination and imaging studies are consistent with advanced degenerative joint disease of the left knee. The patient has failed conservative treatment.  The clearance notes were reviewed.  After discussion with the patient it was felt that Total Knee Replacement was indicated. The procedure,  risks, and benefits of total knee arthroplasty were presented and reviewed. The risks including but not limited to aseptic loosening, infection, blood clots, vascular injury, stiffness, patella tracking problems complications among others were discussed. The patient acknowledged the explanation, agreed to proceed with the plan.  Preoperative templating of the joint replacement has been completed, documented, and submitted to the Operating Room personnel in order to optimize intra-operative equipment management.    Patient's anticipated LOS is less than 2 midnights, meeting these requirements: - Younger than 81 - Lives within 1 hour of care - Has a competent adult at home to recover with post-op recover - NO history of  - Chronic pain requiring opiods  - Diabetes  - Coronary Artery Disease  - Heart failure  - Heart attack  - Stroke  - DVT/VTE  - Cardiac arrhythmia  - Respiratory Failure/COPD  - Renal failure  - Anemia  - Advanced Liver disease     Donia Ast 02/02/2021, 7:05 AM

## 2021-02-02 NOTE — Anesthesia Postprocedure Evaluation (Signed)
Anesthesia Post Note  Patient: Christie Atkinson  Procedure(s) Performed: TOTAL KNEE ARTHROPLASTY (Left: Knee)     Patient location during evaluation: PACU Anesthesia Type: Regional, MAC and Spinal Level of consciousness: awake and alert Pain management: pain level controlled Vital Signs Assessment: post-procedure vital signs reviewed and stable Respiratory status: spontaneous breathing, nonlabored ventilation, respiratory function stable and patient connected to nasal cannula oxygen Cardiovascular status: blood pressure returned to baseline and stable Postop Assessment: no apparent nausea or vomiting Anesthetic complications: no   No notable events documented.  Last Vitals:  Vitals:   02/02/21 1130 02/02/21 1156  BP: 125/90 135/77  Pulse: 66 63  Resp: 19 16  Temp:  36.6 C  SpO2: 97% 92%    Last Pain:  Vitals:   02/02/21 1156  TempSrc: Oral  PainSc: 2                  March Rummage Channell Quattrone

## 2021-02-02 NOTE — Progress Notes (Signed)
Orthopedic Tech Progress Note Patient Details:  Christie Atkinson 1956-02-28 [id number]  CPM Left Knee CPM Left Knee: On Left Knee Flexion (Degrees): 90 Left Knee Extension (Degrees): 0  Post Interventions Patient Tolerated: Well Instructions Provided: Care of device  [name] 02/02/2021, 10:31 AM

## 2021-02-02 NOTE — Plan of Care (Signed)
  Problem: Activity: Goal: Ability to avoid complications of mobility impairment will improve Outcome: Progressing   Problem: Clinical Measurements: Goal: Postoperative complications will be avoided or minimized Outcome: Progressing   Problem: Pain Management: Goal: Pain level will decrease with appropriate interventions Outcome: Progressing   Problem: Skin Integrity: Goal: Will show signs of wound healing Outcome: Progressing   

## 2021-02-02 NOTE — Anesthesia Procedure Notes (Signed)
Anesthesia Regional Block: Adductor canal block   Pre-Anesthetic Checklist: , timeout performed,  Correct Patient, Correct Site, Correct Laterality,  Correct Procedure, Correct Position, site marked,  Risks and benefits discussed,  Surgical consent,  Pre-op evaluation,  At surgeon's request and post-op pain management  Laterality: Left  Prep: Dura Prep       Needles:  Injection technique: Single-shot  Needle Type: Echogenic Stimulator Needle     Needle Length: 10cm  Needle Gauge: 20     Additional Needles:   Procedures:,,,, ultrasound used (permanent image in chart),,    Narrative:  Start time: 02/02/2021 7:49 AM End time: 02/02/2021 7:51 AM Injection made incrementally with aspirations every 5 mL.  Performed by: Personally  Anesthesiologist: Darral Dash, DO  Additional Notes: Patient identified. Risks/Benefits/Options discussed with patient including but not limited to bleeding, infection, nerve damage, failed block, incomplete pain control. Patient expressed understanding and wished to proceed. All questions were answered. Sterile technique was used throughout the entire procedure. Please see nursing notes for vital signs. Aspirated in 5cc intervals with injection for negative confirmation. Patient was given instructions on fall risk and not to get out of bed. All questions and concerns addressed with instructions to call with any issues or inadequate analgesia.

## 2021-02-02 NOTE — Anesthesia Procedure Notes (Signed)
Spinal  Patient location during procedure: OR Start time: 02/02/2021 8:13 AM End time: 02/02/2021 8:16 AM Staffing Performed: anesthesiologist  Anesthesiologist: Darral Dash, DO Preanesthetic Checklist Completed: patient identified, IV checked, site marked, risks and benefits discussed, surgical consent, monitors and equipment checked, pre-op evaluation and timeout performed Spinal Block Patient position: sitting Prep: DuraPrep Patient monitoring: heart rate, cardiac monitor, continuous pulse ox and blood pressure Approach: midline Location: L3-4 Injection technique: single-shot Needle Needle type: Pencan  Needle gauge: 24 G Needle length: 10 cm Assessment Events: CSF return Additional Notes Patient identified. Risks/Benefits/Options discussed with patient including but not limited to bleeding, infection, nerve damage, paralysis, failed block, incomplete pain control, headache, blood pressure changes, nausea, vomiting, reactions to medications, itching and postpartum back pain. Confirmed with bedside nurse the patient's most recent platelet count. Confirmed with patient that they are not currently taking any anticoagulation, have any bleeding history or any family history of bleeding disorders. Patient expressed understanding and wished to proceed. All questions were answered. Sterile technique was used throughout the entire procedure. Please see nursing notes for vital signs. Warning signs of high block given to the patient including shortness of breath, tingling/numbness in hands, complete motor block, or any concerning symptoms with instructions to call for help. Patient was given instructions on fall risk and not to get out of bed. All questions and concerns addressed with instructions to call with any issues or inadequate analgesia.

## 2021-02-02 NOTE — Evaluation (Signed)
Physical Therapy Evaluation Patient Details Name: Christie Atkinson MRN: [id number] DOB: 05-29-1956 Today's Date: 02/02/2021   History of Present Illness  Patient is 65 y.o. female s/p Lt TKA on 02/02/21 with PMH significant for OA, Afib, fibromyalgia, DM, back pain, asthma, Rt TKA on 10/20/20.    Clinical Impression  Christie Atkinson is a 65 y.o. female POD 0 s/p Lt TKA. Patient reports independence with rollator for mobility at baseline due to back pain. Patient is now limited by functional impairments (see PT problem list below) and requires min assist for transfers and gait with RW. Patient was able to take small steps to move bed>chair with RW and min assist. Further gait limited by pain. Patient instructed in exercise to facilitate ROM and circulation to manage edema. Patient will benefit from continued skilled PT interventions to address impairments and progress towards PLOF. Acute PT will follow to progress mobility and stair training in preparation for safe discharge home.     Follow Up Recommendations Follow surgeon's recommendation for DC plan and follow-up therapies    Equipment Recommendations  None recommended by PT    Recommendations for Other Services       Precautions / Restrictions Precautions Precautions: Fall Restrictions Weight Bearing Restrictions: No LLE Weight Bearing: Weight bearing as tolerated      Mobility  Bed Mobility Overal bed mobility: Needs Assistance Bed Mobility: Supine to Sit     Supine to sit: Min assist;Min guard   Bed mobility comments: cues to use bed rail, assist to bring bil LE's off EOB and to raise trunk with use of bed rail.        Transfers Overall transfer level: Needs assistance Equipment used: Rolling walker (2 wheeled) Transfers: Sit to/from [facility] Sit to Stand: Min assist Stand pivot transfers: Min assist   Transfer Comments: cues for hand placement with power up, assist to steady wtih rise and to  manage walker with turn to step to recliner.        Ambulation/Gait                Stairs            Wheelchair Mobility    Modified Rankin (Stroke Patients Only)       Balance Overall balance assessment: Needs assistance Sitting-balance support: Feet supported;Bilateral upper extremity supported Sitting balance-Leahy Scale: Good     Standing balance support: During functional activity;Bilateral upper extremity supported Standing balance-Leahy Scale: Poor                               Pertinent Vitals/Pain Pain Assessment: Faces Faces Pain Scale: Hurts even more Pain Location: Lt knee Pain Descriptors / Indicators: Aching;Discomfort Pain Intervention(s): Limited activity within patient's tolerance;Monitored during session;Repositioned;Ice applied    Home Living Family/patient expects to be discharged to:: Private residence Living Arrangements: Other relatives Available Help at Discharge: Family Type of Home: House Home Access: Stairs to enter Entrance Stairs-Rails: Right;Left;Can reach both Entrance Stairs-Number of Steps: 4+1 Home Layout: One level Home Equipment: Environmental consultant - 4 wheels;Cane - single point;Bedside commode;Shower seat Additional Comments: pt's significant other and friend are living with her and can assist as she recovers.    Prior Function Level of Independence: Independent with assistive device(s)         Comments: uses rollator due to back pain     Hand Dominance   Dominant Hand: Right    Extremity/Trunk Assessment  Upper Extremity Assessment Upper Extremity Assessment: Overall WFL for tasks assessed    Lower Extremity Assessment Lower Extremity Assessment: LLE deficits/detail LLE Deficits / Details: good quad activation, no extensor lag with SLR LLE Sensation: WNL LLE Coordination: WNL    Cervical / Trunk Assessment Cervical / Trunk Assessment: Normal  Communication   Communication: No difficulties   Cognition Arousal/Alertness: Awake/alert Behavior During Therapy: WFL for tasks assessed/performed Overall Cognitive Status: Within Functional Limits for tasks assessed                                        General Comments      Exercises Total Joint Exercises Ankle Circles/Pumps: AROM;Both;Seated;20 reps   Assessment/Plan    PT Assessment Patient needs continued PT services  PT Problem List Decreased strength;Decreased range of motion;Decreased activity tolerance;Decreased balance;Decreased mobility;Decreased knowledge of use of DME;Decreased knowledge of precautions;Pain       PT Treatment Interventions DME instruction;Gait training;Stair training;Functional mobility training;Therapeutic activities;Therapeutic exercise;Balance training;Patient/family education    PT Goals (Current goals can be found in the Care Plan section)  Acute Rehab PT Goals Patient Stated Goal: stop hurting and regain independence PT Goal Formulation: With patient Time For Goal Achievement: 02/09/21 Potential to Achieve Goals: Good    Frequency 7X/week   Barriers to discharge        Co-evaluation               AM-PAC PT "6 Clicks" Mobility  Outcome Measure Help needed turning from your back to your side while in a flat bed without using bedrails?: A Little Help needed moving from lying on your back to sitting on the side of a flat bed without using bedrails?: A Little Help needed moving to and from a bed to a chair (including a wheelchair)?: A Little Help needed standing up from a chair using your arms (e.g., wheelchair or bedside chair)?: A Little Help needed to walk in hospital room?: A Little Help needed climbing 3-5 steps with a railing? : A Lot 6 Click Score: 17    End of Session Equipment Utilized During Treatment: Gait belt Activity Tolerance: Patient tolerated treatment well Patient left: in chair;with call bell/phone within reach;with chair alarm set Nurse  Communication: Mobility status PT Visit Diagnosis: Muscle weakness (generalized) (M62.81);Difficulty in walking, not elsewhere classified (R26.2);Pain Pain - Right/Left: Left Pain - part of body: Knee    Time: [id number] PT Time Calculation (min) (ACUTE ONLY): 24 min   Charges:   PT Evaluation $PT Eval Low Complexity: 1 Low PT Treatments $Therapeutic Activity: 8-22 mins        Verner Mould, DPT Acute Rehabilitation Services Office [phone] Pager [phone]   Jacques Navy 02/02/2021, 5:23 PM

## 2021-02-02 NOTE — Progress Notes (Signed)
Assisted Dr. Greg Stoltzfus with left, ultrasound guided, adductor canal block. Side rails up, monitors on throughout procedure. See vital signs in flow sheet. Tolerated Procedure well.  

## 2021-02-02 NOTE — Op Note (Signed)
TOTAL KNEE REPLACEMENT OPERATIVE NOTE:  02/02/2021  11:27 AM  PATIENT:  Christie Atkinson  65 y.o. female  PRE-OPERATIVE DIAGNOSIS:  Osteoarthritis of left knee M17.12  POST-OPERATIVE DIAGNOSIS:  Osteoarthritis of left knee M17.12  PROCEDURE:  Procedure(s): TOTAL KNEE ARTHROPLASTY  SURGEON:  Surgeon(s): Vickey Huger, MD  PHYSICIAN ASSISTANT: Nehemiah Massed, PA-C  ANESTHESIA:   spinal  SPECIMEN: None  COUNTS:  Correct  TOURNIQUET:   Total Tourniquet Time Documented: Thigh (Left) - 47 minutes Total: Thigh (Left) - 47 minutes   DICTATION:  Indication for procedure:    The patient is a 65 y.o. female who has failed conservative treatment for Osteoarthritis of left knee M17.12.  Informed consent was obtained prior to anesthesia. The risks versus benefits of the operation were explain and in a way the patient can, and did, understand.    Description of procedure:     The patient was taken to the operating room and placed under anesthesia.  The patient was positioned in the usual fashion taking care that all body parts were adequately padded and/or protected.  A tourniquet was applied and the leg prepped and draped in the usual sterile fashion.  The extremity was exsanguinated with the esmarch and tourniquet inflated to 300 mmHg.  Pre-operative range of motion was normal.    A midline incision approximately 6-7 inches long was made with a #10 blade.  A new blade was used to make a parapatellar arthrotomy going 2-3 cm into the quadriceps tendon, over the patella, and alongside the medial aspect of the patellar tendon.  A synovectomy was then performed with the #10 blade and forceps. I then elevated the deep MCL off the medial tibial metaphysis subperiosteally around to the semimembranosus attachment.    I everted the patella and used calipers to measure patellar thickness.  I used the reamer to ream down to appropriate thickness to recreate the native thickness.  I then removed  excess bone with the rongeur and sagittal saw.  I used the appropriately sized template and drilled the three lug holes.  I then put the trial in place and measured the thickness with the calipers to ensure recreation of the native thickness.  The trial was then removed and the patella subluxed and the knee brought into flexion.  A homan retractor was place to retract and protect the patella and lateral structures.  A Z-retractor was place medially to protect the medial structures.  The extra-medullary alignment system was used to make cut the tibial articular surface perpendicular to the anamotic axis of the tibia and in 3 degrees of posterior slope.  The cut surface and alignment jig was removed.  I then used the intramedullary alignment guide to make a valgus cut on the distal femur.  I then marked out the epicondylar axis on the distal femur.   I then used the anterior referencing sizer and measured the femur to be a size 7.  The 4-In-1 cutting block was screwed into place in external rotation matching the posterior condylar angle, making our cuts perpendicular to the epicondylar axis.  Anterior, posterior and chamfer cuts were made with the sagittal saw.  The cutting block and cut pieces were removed.  A lamina spreader was placed in 90 degrees of flexion.  The ACL, PCL, menisci, and posterior condylar osteophytes were removed.  A 10 mm spacer blocked was found to offer good flexion and extension gap balance after minimal in degree releasing.   The scoop retractor was then placed  and the femoral finishing block was pinned in place.  The small sagittal saw was used as well as the lug drill to finish the femur.  The block and cut surfaces were removed and the medullary canal hole filled with autograft bone from the cut pieces.  The tibia was delivered forward in deep flexion and external rotation.  A size D tray was selected and pinned into place centered on the medial 1/3 of the tibial tubercle.  The  reamer and keel was used to prepare the tibia through the tray.    I then trialed with the size 7 femur, size D tibia, a 10 mm insert and the 32 patella.  I had excellent flexion/extension gap balance, excellent patella tracking.  Flexion was full and beyond 120 degrees; extension was zero.  These components were chosen and the staff opened them to me on the back table while the knee was lavaged copiously and the cement mixed.  The soft tissue was infiltrated with 60cc of exparel 1.3% through a 21 gauge needle.  I cemented in the components and removed all excess cement.  The polyethylene tibial component was snapped into place and the knee placed in extension while cement was hardening.  The capsule was infilltrated with a 60cc exparel/marcaine/saline mixture.   Once the cement was hard, the tourniquet was let down.  Hemostasis was obtained.  The arthrotomy was closed using a #1 stratofix running suture.  The deep soft tissues were closed with #0 vicryls and the subcuticular layer closed with #2-0 vicryl.  The skin was reapproximated and closed with 3.0 Monocryl.  The wound was covered with steristrips, aquacel dressing, and a TED stocking.   The patient was then awakened, extubated, and taken to the recovery room in stable condition.  BLOOD LOSS:  0000000 COMPLICATIONS:  None.  PLAN OF CARE: Admit for overnight observation  PATIENT DISPOSITION:  PACU - hemodynamically stable.    Please fax a copy of this op note to my office at [phone] (please only include page 1 and 2 of the Case Information op note)

## 2021-02-02 NOTE — Transfer of Care (Signed)
Immediate Anesthesia Transfer of Care Note  Patient: Christie Atkinson  Procedure(s) Performed: TOTAL KNEE ARTHROPLASTY (Left: Knee)  Patient Location: PACU  Anesthesia Type:Regional, Spinal and MAC combined with regional for post-op pain  Level of Consciousness: awake, alert , oriented and patient cooperative  Airway & Oxygen Therapy: Patient Spontanous Breathing and Patient connected to face mask oxygen  Post-op Assessment: Report given to RN and Post -op Vital signs reviewed and stable  Post vital signs: Reviewed and stable  Last Vitals:  Vitals Value Taken Time  BP 122/62 02/02/21 1010  Temp    Pulse 63 02/02/21 1014  Resp 12 02/02/21 1014  SpO2 91 % 02/02/21 1014  Vitals shown include unvalidated device data.  Last Pain:  Vitals:   02/02/21 0648  TempSrc: Oral         Complications: No notable events documented.

## 2021-02-03 DIAGNOSIS — M1712 Unilateral primary osteoarthritis, left knee: Secondary | ICD-10-CM | POA: Diagnosis not present

## 2021-02-03 LAB — CBC
HCT: 35.3 % — ABNORMAL LOW (ref 36.0–46.0)
Hemoglobin: 11.8 g/dL — ABNORMAL LOW (ref 12.0–15.0)
MCH: 30.1 pg (ref 26.0–34.0)
MCHC: 33.4 g/dL (ref 30.0–36.0)
MCV: 90.1 fL (ref 80.0–100.0)
Platelets: 238 10*3/uL (ref 150–400)
RBC: 3.92 MIL/uL (ref 3.87–5.11)
RDW: 13 % (ref 11.5–15.5)
WBC: 18.2 10*3/uL — ABNORMAL HIGH (ref 4.0–10.5)
nRBC: 0 % (ref 0.0–0.2)

## 2021-02-03 LAB — BASIC METABOLIC PANEL
Anion gap: 6 (ref 5–15)
BUN: 15 mg/dL (ref 8–23)
CO2: 24 mmol/L (ref 22–32)
Calcium: 8.8 mg/dL — ABNORMAL LOW (ref 8.9–10.3)
Chloride: 107 mmol/L (ref 98–111)
Creatinine, Ser: 0.63 mg/dL (ref 0.44–1.00)
GFR, Estimated: >60 mL/min (ref >60)
Glucose, Bld: 148 mg/dL — ABNORMAL HIGH (ref 70–99)
Potassium: 4.1 mmol/L (ref 3.5–5.1)
Sodium: 137 mmol/L (ref 135–145)

## 2021-02-03 MED ORDER — METHOCARBAMOL 500 MG PO TABS: 500.0000 mg | ORAL_TABLET | Freq: Four times a day (QID) | ORAL | 0 refills | Status: DC

## 2021-02-03 MED ORDER — OXYCODONE HCL 5 MG PO TABS: 5.0000 mg | ORAL_TABLET | Freq: Four times a day (QID) | ORAL | 0 refills | Status: DC | PRN

## 2021-02-03 MED ORDER — GABAPENTIN 300 MG PO CAPS: 300.0000 mg | ORAL_CAPSULE | Freq: Three times a day (TID) | ORAL | 0 refills | Status: DC

## 2021-02-03 NOTE — Progress Notes (Signed)
Physical Therapy Treatment Patient Details Name: Christie Atkinson MRN: [id number] DOB: May 15, 1956 Today's Date: 02/03/2021    History of Present Illness Patient is 65 y.o. female s/p Lt TKA on 02/02/21 with PMH significant for OA, Afib, fibromyalgia, DM, back pain, asthma, Rt TKA on 10/20/20.    PT Comments    Progressing with mobility. HR up to 128 bpm during session. Moderate pain with activity. Will continue to work towards PT goals this afternoon.     Follow Up Recommendations  Follow surgeon's recommendation for DC plan and follow-up therapies;Supervision OOB/mobility     Equipment Recommendations  None recommended by PT    Recommendations for Other Services       Precautions / Restrictions Precautions Precautions: Fall Restrictions Weight Bearing Restrictions: No LLE Weight Bearing: Weight bearing as tolerated    Mobility  Bed Mobility Overal bed mobility: Needs Assistance Bed Mobility: Supine to Sit     Supine to sit: Min assist     General bed mobility comments: Assist for L LE. Increased time. Cues provided    Transfers Overall transfer level: Needs assistance Equipment used: Rolling walker (2 wheeled) Transfers: Sit to/from Stand Sit to Stand: Min assist         General transfer comment: Assist to rise, steady, control descent. Cues for safety, technique, hand/LE placement  Ambulation/Gait Ambulation/Gait assistance: Min assist Gait Distance (Feet): 60 Feet Assistive device: Rolling walker (2 wheeled) Gait Pattern/deviations: Step-to pattern;Step-through pattern;Decreased stride length     General Gait Details: Cues for safety, technique, sequence, and for pt to increase knee flexion and DF foot during swing through phase on L (toes tend to drag through intermittently). Slow gait speed. Pt c/o mild lightheadedness. HR 128 bpm.   Stairs             Wheelchair Mobility    Modified Rankin (Stroke Patients Only)       Balance Overall  balance assessment: Needs assistance         Standing balance support: Bilateral upper extremity supported Standing balance-Leahy Scale: Fair                              Cognition Arousal/Alertness: Awake/alert Behavior During Therapy: WFL for tasks assessed/performed Overall Cognitive Status: Within Functional Limits for tasks assessed                                        Exercises Total Joint Exercises Ankle Circles/Pumps: AROM;Both;10 reps Quad Sets: AROM;Both;10 reps Heel Slides: AAROM;Left;10 reps Hip ABduction/ADduction: AAROM;Left;10 reps;AROM Straight Leg Raises: Left;10 reps;AAROM;AROM Goniometric ROM: ~10-65 degrees    General Comments        Pertinent Vitals/Pain Pain Assessment: 0-10 Pain Score: 7  Pain Location: L knee/thigh Pain Descriptors / Indicators: Discomfort;Sore;Aching Pain Intervention(s): Monitored during session;Ice applied    Home Living                      Prior Function            PT Goals (current goals can now be found in the care plan section) Progress towards PT goals: Progressing toward goals    Frequency    7X/week      PT Plan Current plan remains appropriate    Co-evaluation  AM-PAC PT "6 Clicks" Mobility   Outcome Measure  Help needed turning from your back to your side while in a flat bed without using bedrails?: A Little Help needed moving from lying on your back to sitting on the side of a flat bed without using bedrails?: A Little Help needed moving to and from a bed to a chair (including a wheelchair)?: A Little Help needed standing up from a chair using your arms (e.g., wheelchair or bedside chair)?: A Little Help needed to walk in hospital room?: A Little Help needed climbing 3-5 steps with a railing? : A Little 6 Click Score: 18    End of Session Equipment Utilized During Treatment: Gait belt Activity Tolerance: Patient tolerated treatment  well Patient left: in chair;with call bell/phone within reach;with chair alarm set   PT Visit Diagnosis: Other abnormalities of gait and mobility (R26.89);Pain Pain - Right/Left: Left Pain - part of body: Knee     Time: 1006-1040 PT Time Calculation (min) (ACUTE ONLY): 34 min  Charges:  $Gait Training: 8-22 mins $Therapeutic Exercise: 8-22 mins                         Doreatha Massed, PT Acute Rehabilitation  Office: [phone] Pager: [phone]

## 2021-02-03 NOTE — Progress Notes (Signed)
Physical Therapy Treatment Patient Details Name: Christie Atkinson MRN: [id number] DOB: 06-30-1955 Today's Date: 02/03/2021    History of Present Illness Patient is 65 y.o. female s/p Lt TKA on 02/02/21 with PMH significant for OA, Afib, fibromyalgia, DM, back pain, asthma, Rt TKA on 10/20/20.    PT Comments    Progressing well. Reviewed/practiced gait and stair training. All education completed. Okay to d/c from PT standpoint.     Follow Up Recommendations  Follow surgeon's recommendation for DC plan and follow-up therapies     Equipment Recommendations  None recommended by PT    Recommendations for Other Services       Precautions / Restrictions Precautions Precautions: Fall Restrictions Weight Bearing Restrictions: No LLE Weight Bearing: Weight bearing as tolerated    Mobility  Bed Mobility Overal bed mobility: Needs Assistance Bed Mobility: Supine to Sit     Supine to sit: Min guard     General bed mobility comments: Increased time. Pt used UEs to assist L LE off bed    Transfers Overall transfer level: Needs assistance Equipment used: Rolling walker (2 wheeled) Transfers: Sit to/from Stand Sit to Stand: Min guard         General transfer comment: Min guard for safety. Cues for safety, hand placement.  Ambulation/Gait Ambulation/Gait assistance: Min guard Gait Distance (Feet): 75 Feet Assistive device: Rolling walker (2 wheeled) Gait Pattern/deviations: Step-to pattern;Step-through pattern;Decreased stride length     General Gait Details: Cues for safety, technique, sequence, and for pt to increase knee flexion and DF foot during swing through phase on L (toes tend to drag through intermittently). Slow gait speed.   Stairs Stairs: Yes   Stair Management: Step to pattern;Forwards;Two rails Number of Stairs: 2 General stair comments: Up and over portable stairs x 1. Cues for safety, technique, sequence. Min guard for safety   Wheelchair Mobility     Modified Rankin (Stroke Patients Only)       Balance Overall balance assessment: Needs assistance         Standing balance support: Bilateral upper extremity supported Standing balance-Leahy Scale: Fair                              Cognition Arousal/Alertness: Awake/alert Behavior During Therapy: WFL for tasks assessed/performed Overall Cognitive Status: Within Functional Limits for tasks assessed                                        Exercises      General Comments        Pertinent Vitals/Pain Pain Assessment: 0-10 Pain Score: 6  Pain Location: L knee/thigh Pain Descriptors / Indicators: Discomfort;Sore;Aching Pain Intervention(s): Limited activity within patient's tolerance;Monitored during session;Ice applied;Repositioned    Home Living                      Prior Function            PT Goals (current goals can now be found in the care plan section) Progress towards PT goals: Progressing toward goals    Frequency    7X/week      PT Plan Current plan remains appropriate    Co-evaluation              AM-PAC PT "6 Clicks" Mobility   Outcome Measure  Help needed turning from  your back to your side while in a flat bed without using bedrails?: A Little Help needed moving from lying on your back to sitting on the side of a flat bed without using bedrails?: A Little Help needed moving to and from a bed to a chair (including a wheelchair)?: A Little Help needed standing up from a chair using your arms (e.g., wheelchair or bedside chair)?: A Little Help needed to walk in hospital room?: A Little Help needed climbing 3-5 steps with a railing? : A Little 6 Click Score: 18    End of Session Equipment Utilized During Treatment: Gait belt Activity Tolerance: Patient tolerated treatment well Patient left: in bed;with call bell/phone within reach Nurse Communication:  (pt requesting assistance getting dressed) PT  Visit Diagnosis: Other abnormalities of gait and mobility (R26.89) Pain - Right/Left: Left Pain - part of body: Knee     Time: [id number] PT Time Calculation (min) (ACUTE ONLY): 14 min  Charges:  $Gait Training: 8-22 mins                         Doreatha Massed, PT Acute Rehabilitation  Office: [phone] Pager: [phone]

## 2021-02-03 NOTE — TOC Transition Note (Signed)
Transition of Care [facility]) - CM/SW Discharge Note  Patient Details  Name: Christie Atkinson MRN: [id number] Date of Birth: 1955/07/31  Transition of Care [facility]) CM/SW Contact:  Sherie Don, LCSW Phone Number: 02/03/2021, 10:20 AM  Clinical Narrative: Patient is expected to discharge home after working with PT. CSW met with patient to confirm discharge plan. Patient will discharge home with HHPT through [place] and then transition to [place] at [name] in [place]. Patient has a CPM, 3N1, and rolling walker at home so there are no DME needs at this time. TOC signing off.  Final next level of care: [place] Barriers to Discharge: No Barriers Identified  Patient Goals and CMS Choice Patient states their goals for this hospitalization and ongoing recovery are:: Discharge home with [place] CMS Medicare.gov Compare Post Acute Care list provided to:: Patient Choice offered to / list presented to : Patient  Discharge Plan and Services       DME Arranged: N/A DME Agency: NA HH Arranged: PT HH Agency: [place] Representative spoke with at [place] in orthopedist's office  Readmission Risk Interventions No flowsheet data found.

## 2021-02-03 NOTE — Progress Notes (Signed)
Patient refused removal of foley catheter until after PT session.

## 2021-02-03 NOTE — Discharge Summary (Signed)
SPORTS MEDICINE & JOINT REPLACEMENT   Lara Mulch, MD   Carlyon Shadow, PA-C [address], [place], [place]  [address]                             [phone]  PATIENT ID: Christie Atkinson        MRN:  [id number]          DOB/AGE: [birth year] / 66 y.o.    DISCHARGE SUMMARY  ADMISSION DATE:    02/02/2021 DISCHARGE DATE:   02/03/2021   ADMISSION DIAGNOSIS: S/P total knee replacement [Z96.659]    DISCHARGE DIAGNOSIS:  Osteoarthritis of left knee M17.12    ADDITIONAL DIAGNOSIS: Active Problems:   * No active hospital problems. *  Past Medical History:  Diagnosis Date   Asthma    Atrial fibrillation ([facility])    Back pain    Diabetes mellitus without complication (HCC)    Diastolic dysfunction    Dysrhythmia    a fib   Fibromyalgia    neuropathy feet   GERD (gastroesophageal reflux disease)    History of hiatal hernia    History of kidney stones    Hypertension    Knee pain    Osteoarthritis    Persistent atrial fibrillation (HCC)    Plantar fasciitis    Sleep apnea    wears oral appliance   Visit for monitoring Tikosyn therapy 02/07/2018    PROCEDURE: Procedure(s): TOTAL KNEE ARTHROPLASTY on 02/02/2021  CONSULTS:    HISTORY:  See H&P in chart  HOSPITAL COURSE:  Christie Atkinson is a 65 y.o. admitted on 02/02/2021 and found to have a diagnosis of Osteoarthritis of left knee M17.12.  After appropriate laboratory studies were obtained  they were taken to the operating room on 02/02/2021 and underwent Procedure(s): TOTAL KNEE ARTHROPLASTY.   They were given perioperative antibiotics:  Anti-infectives (From admission, onward)    Start     Dose/Rate Route Frequency Ordered Stop   02/02/21 0600  ceFAZolin (ANCEF) IVPB 2g/100 mL premix  Status:  Discontinued        2 g 200 mL/hr over 30 Minutes Intravenous On call to O.R. 02/02/21 0534 02/02/21 1149     .  Patient given tranexamic acid IV or topical and exparel intra-operatively.  Tolerated the procedure well.     POD# 1: Vital signs were stable.  Patient denied Chest pain, shortness of breath, or calf pain.  Patient was started on Aspirin twice daily at 8am.  Consults to PT, OT, and care management were made.  The patient was weight bearing as tolerated.  CPM was placed on the operative leg 0-90 degrees for 6-8 hours a day. When out of the CPM, patient was placed in the foam block to achieve full extension. Incentive spirometry was taught.  Dressing was changed.       POD #2, Continued  PT for ambulation and exercise program.  IV saline locked.  O2 discontinued.    The remainder of the hospital course was dedicated to ambulation and strengthening.   The patient was discharged on 1 Day Post-Op in  Good condition.  Blood products given:none  DIAGNOSTIC STUDIES: Recent vital signs: Patient Vitals for the past 24 hrs:  BP Temp Temp src Pulse Resp SpO2 Height Weight  02/03/21 1015 130/90 98 F (36.7 C) Oral (!) 110 18 94 % -- --  02/03/21 0516 (!) 119/92 (!) 97.5 F (36.4 C) Oral (!) 109  18 97 % -- --  02/03/21 0147 123/84 98 F (36.7 C) Oral (!) 107 20 96 % -- --  02/02/21 2311 120/81 98.2 F (36.8 C) Oral (!) 105 15 97 % -- --  02/02/21 1845 139/73 97.8 F (36.6 C) Oral 84 18 93 % -- --  02/02/21 1501 (!) 130/53 98.3 F (36.8 C) Oral 75 18 -- '5\' 5"'$  (1.651 m) 100.7 kg  02/02/21 1414 (!) 130/53 98.3 F (36.8 C) Oral 75 18 97 % -- --       Recent laboratory studies: Recent Labs    02/03/21 0329  WBC 18.2*  HGB 11.8*  HCT 35.3*  PLT 238   Recent Labs    02/03/21 0329  NA 137  K 4.1  CL 107  CO2 24  BUN 15  CREATININE 0.63  GLUCOSE 148*  CALCIUM 8.8*   Lab Results  Component Value Date   INR 1.07 08/09/2017     Recent Radiographic Studies :  No results found.  DISCHARGE INSTRUCTIONS: Discharge Instructions     Call MD / Call 911   Complete by: As directed    If you experience chest pain or shortness of breath, CALL 911 and be transported to the hospital emergency  room.  If you develope a fever above 101 F, pus (white drainage) or increased drainage or redness at the wound, or calf pain, call your surgeon's office.   Constipation Prevention   Complete by: As directed    Drink plenty of fluids.  Prune juice may be helpful.  You may use a stool softener, such as Colace (over the counter) 100 mg twice a day.  Use MiraLax (over the counter) for constipation as needed.   Diet - low sodium heart healthy   Complete by: As directed    Discharge instructions   Complete by: As directed    INSTRUCTIONS AFTER JOINT REPLACEMENT   Remove items at home which could result in a fall. This includes throw rugs or furniture in walking pathways ICE to the affected joint every three hours while awake for 30 minutes at a time, for at least the first 3-5 days, and then as needed for pain and swelling.  Continue to use ice for pain and swelling. You may notice swelling that will progress down to the foot and ankle.  This is normal after surgery.  Elevate your leg when you are not up walking on it.   Continue to use the breathing machine you got in the hospital (incentive spirometer) which will help keep your temperature down.  It is common for your temperature to cycle up and down following surgery, especially at night when you are not up moving around and exerting yourself.  The breathing machine keeps your lungs expanded and your temperature down.   DIET:  As you were doing prior to hospitalization, we recommend a well-balanced diet.  DRESSING / WOUND CARE / SHOWERING  Keep the surgical dressing until follow up.  The dressing is water proof, so you can shower without any extra covering.  IF THE DRESSING FALLS OFF or the wound gets wet inside, change the dressing with sterile gauze.  Please use good hand washing techniques before changing the dressing.  Do not use any lotions or creams on the incision until instructed by your surgeon.    ACTIVITY  Increase activity slowly as  tolerated, but follow the weight bearing instructions below.   No driving for 6 weeks or until further direction given by  your physician.  You cannot drive while taking narcotics.  No lifting or carrying greater than 10 lbs. until further directed by your surgeon. Avoid periods of inactivity such as sitting longer than an hour when not asleep. This helps prevent blood clots.  You may return to work once you are authorized by your doctor.     WEIGHT BEARING   Weight bearing as tolerated with assist device (walker, cane, etc) as directed, use it as long as suggested by your surgeon or therapist, typically at least 4-6 weeks.   EXERCISES  Results after joint replacement surgery are often greatly improved when you follow the exercise, range of motion and muscle strengthening exercises prescribed by your doctor. Safety measures are also important to protect the joint from further injury. Any time any of these exercises cause you to have increased pain or swelling, decrease what you are doing until you are comfortable again and then slowly increase them. If you have problems or questions, call your caregiver or physical therapist for advice.   Rehabilitation is important following a joint replacement. After just a few days of immobilization, the muscles of the leg can become weakened and shrink (atrophy).  These exercises are designed to build up the tone and strength of the thigh and leg muscles and to improve motion. Often times heat used for twenty to thirty minutes before working out will loosen up your tissues and help with improving the range of motion but do not use heat for the first two weeks following surgery (sometimes heat can increase post-operative swelling).   These exercises can be done on a training (exercise) mat, on the floor, on a table or on a bed. Use whatever works the best and is most comfortable for you.    Use music or television while you are exercising so that the exercises  are a pleasant break in your day. This will make your life better with the exercises acting as a break in your routine that you can look forward to.   Perform all exercises about fifteen times, three times per day or as directed.  You should exercise both the operative leg and the other leg as well.  Exercises include:   Quad Sets - Tighten up the muscle on the front of the thigh (Quad) and hold for 5-10 seconds.   Straight Leg Raises - With your knee straight (if you were given a brace, keep it on), lift the leg to 60 degrees, hold for 3 seconds, and slowly lower the leg.  Perform this exercise against resistance later as your leg gets stronger.  Leg Slides: Lying on your back, slowly slide your foot toward your buttocks, bending your knee up off the floor (only go as far as is comfortable). Then slowly slide your foot back down until your leg is flat on the floor again.  Angel Wings: Lying on your back spread your legs to the side as far apart as you can without causing discomfort.  Hamstring Strength:  Lying on your back, push your heel against the floor with your leg straight by tightening up the muscles of your buttocks.  Repeat, but this time bend your knee to a comfortable angle, and push your heel against the floor.  You may put a pillow under the heel to make it more comfortable if necessary.   A rehabilitation program following joint replacement surgery can speed recovery and prevent re-injury in the future due to weakened muscles. Contact your doctor or a physical  therapist for more information on knee rehabilitation.    CONSTIPATION  Constipation is defined medically as fewer than three stools per week and severe constipation as less than one stool per week.  Even if you have a regular bowel pattern at home, your normal regimen is likely to be disrupted due to multiple reasons following surgery.  Combination of anesthesia, postoperative narcotics, change in appetite and fluid intake all  can affect your bowels.   YOU MUST use at least one of the following options; they are listed in order of increasing strength to get the job done.  They are all available over the counter, and you may need to use some, POSSIBLY even all of these options:    Drink plenty of fluids (prune juice may be helpful) and high fiber foods Colace 100 mg by mouth twice a day  Senokot for constipation as directed and as needed Dulcolax (bisacodyl), take with full glass of water  Miralax (polyethylene glycol) once or twice a day as needed.  If you have tried all these things and are unable to have a bowel movement in the first 3-4 days after surgery call either your surgeon or your primary doctor.    If you experience loose stools or diarrhea, hold the medications until you stool forms back up.  If your symptoms do not get better within 1 week or if they get worse, check with your doctor.  If you experience "the worst abdominal pain ever" or develop nausea or vomiting, please contact the office immediately for further recommendations for treatment.   ITCHING:  If you experience itching with your medications, try taking only a single pain pill, or even half a pain pill at a time.  You can also use Benadryl over the counter for itching or also to help with sleep.   TED HOSE STOCKINGS:  Use stockings on both legs until for at least 2 weeks or as directed by physician office. They may be removed at night for sleeping.  MEDICATIONS:  See your medication summary on the "After Visit Summary" that nursing will review with you.  You may have some home medications which will be placed on hold until you complete the course of blood thinner medication.  It is important for you to complete the blood thinner medication as prescribed.  PRECAUTIONS:  If you experience chest pain or shortness of breath - call 911 immediately for transfer to the hospital emergency department.   If you develop a fever greater that 101 F,  purulent drainage from wound, increased redness or drainage from wound, foul odor from the wound/dressing, or calf pain - CONTACT YOUR SURGEON.                                                   FOLLOW-UP APPOINTMENTS:  If you do not already have a post-op appointment, please call the office for an appointment to be seen by your surgeon.  Guidelines for how soon to be seen are listed in your "After Visit Summary", but are typically between 1-4 weeks after surgery.  OTHER INSTRUCTIONS:   Knee Replacement:  Do not place pillow under knee, focus on keeping the knee straight while resting. CPM instructions: 0-90 degrees, 2 hours in the morning, 2 hours in the afternoon, and 2 hours in the evening. Place foam block, curve side up  under heel at all times except when in CPM or when walking.  DO NOT modify, tear, cut, or change the foam block in any way.  POST-OPERATIVE OPIOID TAPER INSTRUCTIONS: It is important to wean off of your opioid medication as soon as possible. If you do not need pain medication after your surgery it is ok to stop day one. Opioids include: Codeine, Hydrocodone(Norco, Vicodin), Oxycodone(Percocet, oxycontin) and hydromorphone amongst others.  Long term and even short term use of opiods can cause: Increased pain response Dependence Constipation Depression Respiratory depression And more.  Withdrawal symptoms can include Flu like symptoms Nausea, vomiting And more Techniques to manage these symptoms Hydrate well Eat regular healthy meals Stay active Use relaxation techniques(deep breathing, meditating, yoga) Do Not substitute Alcohol to help with tapering If you have been on opioids for less than two weeks and do not have pain than it is ok to stop all together.  Plan to wean off of opioids This plan should start within one week post op of your joint replacement. Maintain the same interval or time between taking each dose and first decrease the dose.  Cut the total  daily intake of opioids by one tablet each day Next start to increase the time between doses. The last dose that should be eliminated is the evening dose.     MAKE SURE YOU:  Understand these instructions.  Get help right away if you are not doing well or get worse.    Thank you for letting us be a part of your medical care team.  It is a privilege we respect greatly.  We hope these instructions will help you stay on track for a fast and full recovery!   Increase activity slowly as tolerated   Complete by: As directed    Post-operative opioid taper instructions:   Complete by: As directed    POST-OPERATIVE OPIOID TAPER INSTRUCTIONS: It is important to wean off of your opioid medication as soon as possible. If you do not need pain medication after your surgery it is ok to stop day one. Opioids include: Codeine, Hydrocodone(Norco, Vicodin), Oxycodone(Percocet, oxycontin) and hydromorphone amongst others.  Long term and even short term use of opiods can cause: Increased pain response Dependence Constipation Depression Respiratory depression And more.  Withdrawal symptoms can include Flu like symptoms Nausea, vomiting And more Techniques to manage these symptoms Hydrate well Eat regular healthy meals Stay active Use relaxation techniques(deep breathing, meditating, yoga) Do Not substitute Alcohol to help with tapering If you have been on opioids for less than two weeks and do not have pain than it is ok to stop all together.  Plan to wean off of opioids This plan should start within one week post op of your joint replacement. Maintain the same interval or time between taking each dose and first decrease the dose.  Cut the total daily intake of opioids by one tablet each day Next start to increase the time between doses. The last dose that should be eliminated is the evening dose.          DISCHARGE MEDICATIONS:   Allergies as of 02/03/2021       Reactions   Nickel  Hives, Itching   Elemental Sulfur Hives   Hydrocodone-acetaminophen Nausea And Vomiting   Other Other (See Comments)   Polyester - including hospital gowns and sheets - allergic contact dermatitis        Medication List     TAKE these medications    acidophilus Caps  capsule Take 1 capsule by mouth daily as needed (intergestion).   albuterol 108 (90 Base) MCG/ACT inhaler Commonly known as: VENTOLIN HFA Inhale 2 puffs into the lungs every 4 (four) hours as needed for wheezing or shortness of breath.   apixaban 5 MG Tabs tablet Commonly known as: Eliquis Take 1 tablet (5 mg total) by mouth 2 (two) times daily.   Bydureon BCise 2 MG/0.85ML Auij Generic drug: Exenatide ER Inject 2 mg into the skin once a week.   diltiazem 120 MG 24 hr capsule Commonly known as: CARDIZEM CD Take 2 tablets in the AM and 1 tablet in the PM   dofetilide 125 MCG capsule Commonly known as: TIKOSYN Take 1 capsule (125 mcg total) by mouth 2 (two) times daily.   fluticasone-salmeterol 100-50 MCG/ACT Aepb Commonly known as: ADVAIR Inhale 1 puff into the lungs 2 (two) times daily.   furosemide 20 MG tablet Commonly known as: LASIX Take 1 tablet (20 mg total) by mouth 2 (two) times daily. What changed:  when to take this reasons to take this   gabapentin 300 MG capsule Commonly known as: NEURONTIN Take 1 capsule (300 mg total) by mouth 3 (three) times daily. What changed: when to take this   lidocaine 5 % Commonly known as: LIDODERM Place 1 patch onto the skin daily. Remove & Discard patch within 12 hours or as directed by MD   Magnesium Gluconate 500 (27 Mg) MG Tabs Take 1 tablet (500 mg total) by mouth daily. What changed: when to take this   metFORMIN 500 MG tablet Commonly known as: GLUCOPHAGE Take 1 tablet (500 mg total) by mouth 2 (two) times daily with a meal. What changed:  when to take this additional instructions   methocarbamol 500 MG tablet Commonly known as:  ROBAXIN Take 1-2 tablets (500-1,000 mg total) by mouth 4 (four) times daily. What changed:  when to take this reasons to take this   metoprolol succinate 50 MG 24 hr tablet Commonly known as: TOPROL-XL Take 1 tablet (50 mg total) by mouth as needed. Take with or immediately following a meal. What changed:  when to take this reasons to take this   MULTIVITAMIN ADULT PO Take by mouth. Taking Copaiba-one tablet by mouth daily   oxyCODONE 5 MG immediate release tablet Commonly known as: Oxy IR/ROXICODONE Take 1-2 tablets (5-10 mg total) by mouth every 6 (six) hours as needed for moderate pain (pain score 4-6).   potassium chloride 10 MEQ CR capsule Commonly known as: MICRO-K Take 10 mEq by mouth 2 (two) times daily.   VITAMIN C PO Take 1 tablet by mouth daily.   MULTIVITAMIN ADULTS PO Take by mouth. Serenity - Taking one tablet by mouth as needed for sleep   Womens Multivitamin Tabs Take 1 tablet by mouth daily.               Durable Medical Equipment  (From admission, onward)           Start     Ordered   02/02/21 1152  DME Walker rolling  Once       Question:  Patient needs a walker to treat with the following condition  Answer:  S/P total knee replacement   02/02/21 1151   02/02/21 1152  DME 3 n 1  Once        02/02/21 1151   02/02/21 1152  DME Bedside commode  Once       Question:  Patient needs a bedside  commode to treat with the following condition  Answer:  S/P total knee replacement   02/02/21 1151            FOLLOW UP VISIT:    DISPOSITION: HOME VS. SNF  Dental Antibiotics:  In most cases prophylactic antibiotics for Dental procdeures after total joint surgery are not necessary.  Exceptions are as follows:  1. History of prior total joint infection  2. Severely immunocompromised (Organ Transplant, cancer chemotherapy, Rheumatoid biologic meds such as Grimes)  3. Poorly controlled diabetes (A1C &gt; 8.0, blood glucose over 200)  If  you have one of these conditions, contact your surgeon for an antibiotic prescription, prior to your dental procedure.   CONDITION:  Good   Donia Ast 02/03/2021, 12:45 PM

## 2021-02-03 NOTE — Progress Notes (Signed)
SPORTS MEDICINE AND JOINT REPLACEMENT  Lara Mulch, MD    Carlyon Shadow, PA-C [address], [place], [place]  [address]                             [phone]   PROGRESS NOTE  Subjective:  negative for Chest Pain  negative for Shortness of Breath  negative for Nausea/Vomiting   negative for Calf Pain  negative for Bowel Movement   Tolerating Diet: yes         Patient reports pain as 5 on 0-10 scale.    Objective: Vital signs in last 24 hours:   Patient Vitals for the past 24 hrs:  BP Temp Temp src Pulse Resp SpO2 Height Weight  02/03/21 1015 130/90 98 F (36.7 C) Oral (!) 110 18 94 % -- --  02/03/21 0516 (!) 119/92 (!) 97.5 F (36.4 C) Oral (!) 109 18 97 % -- --  02/03/21 0147 123/84 98 F (36.7 C) Oral (!) 107 20 96 % -- --  02/02/21 2311 120/81 98.2 F (36.8 C) Oral (!) 105 15 97 % -- --  02/02/21 1845 139/73 97.8 F (36.6 C) Oral 84 18 93 % -- --  02/02/21 1501 (!) 130/53 98.3 F (36.8 C) Oral 75 18 -- '5\' 5"'$  (1.651 m) 100.7 kg  02/02/21 1414 (!) 130/53 98.3 F (36.8 C) Oral 75 18 97 % -- --    '@flow'$ {1959:LAST@   Intake/Output from previous day:   08/29 0701 - 08/30 0700 In: 2248.4 [P.O.:240; I.V.:2008.4] Out: 5300 [Urine:5200]   Intake/Output this shift:   08/30 0701 - 08/30 1900 In: 590 [P.O.:590] Out: 1800 [Urine:1800]   Intake/Output      08/29 0701 08/30 0700 08/30 0701 08/31 0700   P.O. 240 590   I.V. (mL/kg) 2008.4 (19.9)    Total Intake(mL/kg) 2248.4 (22.3) 590 (5.9)   Urine (mL/kg/hr) 5200 (2.2) 1800 (3.1)   Blood 100    Total Output 5300 1800   Net -3051.6 -1210           LABORATORY DATA: Recent Labs    02/03/21 0329  WBC 18.2*  HGB 11.8*  HCT 35.3*  PLT 238   Recent Labs    02/03/21 0329  NA 137  K 4.1  CL 107  CO2 24  BUN 15  CREATININE 0.63  GLUCOSE 148*  CALCIUM 8.8*   Lab Results  Component Value Date   INR 1.07 08/09/2017    Examination:  General appearance: alert, cooperative, and no  distress Extremities: extremities normal, atraumatic, no cyanosis or edema  Wound Exam: clean, dry, intact   Drainage:  None: wound tissue dry  Motor Exam: Quadriceps and Hamstrings Intact  Sensory Exam: Superficial Peroneal, Deep Peroneal, and Tibial normal   Assessment:    1 Day Post-Op  Procedure(s) (LRB): TOTAL KNEE ARTHROPLASTY (Left)  ADDITIONAL DIAGNOSIS:  Active Problems:   * No active hospital problems. *     Plan: Physical Therapy as ordered Weight Bearing as Tolerated (WBAT)  DVT Prophylaxis:   eliquis  DISCHARGE PLAN: Home  Patient doing well and ready for D/C       Patient's anticipated LOS is less than 2 midnights, meeting these requirements: - Younger than 80 - Lives within 1 hour of care - Has a competent adult at home to recover with post-op recover - NO history of  - Chronic pain requiring opiods  - Diabetes  - Coronary  Artery Disease  - Heart failure  - Heart attack  - Stroke  - DVT/VTE  - Cardiac arrhythmia  - Respiratory Failure/COPD  - Renal failure  - Anemia  - Advanced Liver disease      Donia Ast 02/03/2021, 12:42 PM

## 2021-02-03 NOTE — Progress Notes (Signed)
Orthopedic Tech Progress Note Patient Details:  Christie Atkinson [birth year] [id number]  Patient ID: Christie Atkinson, female   DOB: 1955/07/25, 65 y.o.   MRN: [id number]  [name] 02/03/2021, 12:55 PMCPM pickup

## 2021-02-04 DIAGNOSIS — Z7901 Long term (current) use of anticoagulants: Secondary | ICD-10-CM | POA: Diagnosis not present

## 2021-02-04 DIAGNOSIS — Z79899 Other long term (current) drug therapy: Secondary | ICD-10-CM | POA: Diagnosis not present

## 2021-02-04 DIAGNOSIS — J45909 Unspecified asthma, uncomplicated: Secondary | ICD-10-CM | POA: Diagnosis not present

## 2021-02-04 DIAGNOSIS — Z471 Aftercare following joint replacement surgery: Secondary | ICD-10-CM | POA: Diagnosis not present

## 2021-02-04 DIAGNOSIS — K219 Gastro-esophageal reflux disease without esophagitis: Secondary | ICD-10-CM | POA: Diagnosis not present

## 2021-02-04 DIAGNOSIS — E1142 Type 2 diabetes mellitus with diabetic polyneuropathy: Secondary | ICD-10-CM | POA: Diagnosis not present

## 2021-02-04 DIAGNOSIS — M797 Fibromyalgia: Secondary | ICD-10-CM | POA: Diagnosis not present

## 2021-02-04 DIAGNOSIS — I110 Hypertensive heart disease with heart failure: Secondary | ICD-10-CM | POA: Diagnosis not present

## 2021-02-04 DIAGNOSIS — M722 Plantar fascial fibromatosis: Secondary | ICD-10-CM | POA: Diagnosis not present

## 2021-02-04 DIAGNOSIS — Z6837 Body mass index (BMI) 37.0-37.9, adult: Secondary | ICD-10-CM | POA: Diagnosis not present

## 2021-02-04 DIAGNOSIS — I4819 Other persistent atrial fibrillation: Secondary | ICD-10-CM | POA: Diagnosis not present

## 2021-02-04 DIAGNOSIS — Z96652 Presence of left artificial knee joint: Secondary | ICD-10-CM | POA: Diagnosis not present

## 2021-02-04 DIAGNOSIS — I5032 Chronic diastolic (congestive) heart failure: Secondary | ICD-10-CM | POA: Diagnosis not present

## 2021-02-04 DIAGNOSIS — G4730 Sleep apnea, unspecified: Secondary | ICD-10-CM | POA: Diagnosis not present

## 2021-02-04 DIAGNOSIS — Z7984 Long term (current) use of oral hypoglycemic drugs: Secondary | ICD-10-CM | POA: Diagnosis not present

## 2021-02-05 ENCOUNTER — Encounter (HOSPITAL_COMMUNITY): Payer: Self-pay | Admitting: Orthopedic Surgery

## 2021-02-06 DIAGNOSIS — E1142 Type 2 diabetes mellitus with diabetic polyneuropathy: Secondary | ICD-10-CM | POA: Diagnosis not present

## 2021-02-06 DIAGNOSIS — I110 Hypertensive heart disease with heart failure: Secondary | ICD-10-CM | POA: Diagnosis not present

## 2021-02-06 DIAGNOSIS — I5032 Chronic diastolic (congestive) heart failure: Secondary | ICD-10-CM | POA: Diagnosis not present

## 2021-02-06 DIAGNOSIS — Z471 Aftercare following joint replacement surgery: Secondary | ICD-10-CM | POA: Diagnosis not present

## 2021-02-06 DIAGNOSIS — Z96652 Presence of left artificial knee joint: Secondary | ICD-10-CM | POA: Diagnosis not present

## 2021-02-06 DIAGNOSIS — I4819 Other persistent atrial fibrillation: Secondary | ICD-10-CM | POA: Diagnosis not present

## 2021-02-12 DIAGNOSIS — Z6836 Body mass index (BMI) 36.0-36.9, adult: Secondary | ICD-10-CM | POA: Diagnosis not present

## 2021-02-12 DIAGNOSIS — I10 Essential (primary) hypertension: Secondary | ICD-10-CM | POA: Diagnosis not present

## 2021-02-12 DIAGNOSIS — R7303 Prediabetes: Secondary | ICD-10-CM | POA: Diagnosis not present

## 2021-02-12 DIAGNOSIS — R635 Abnormal weight gain: Secondary | ICD-10-CM | POA: Diagnosis not present

## 2021-02-12 DIAGNOSIS — M1990 Unspecified osteoarthritis, unspecified site: Secondary | ICD-10-CM | POA: Diagnosis not present

## 2021-03-02 DIAGNOSIS — M545 Low back pain, unspecified: Secondary | ICD-10-CM | POA: Diagnosis not present

## 2021-03-02 DIAGNOSIS — R10819 Abdominal tenderness, unspecified site: Secondary | ICD-10-CM | POA: Diagnosis not present

## 2021-03-02 DIAGNOSIS — R748 Abnormal levels of other serum enzymes: Secondary | ICD-10-CM | POA: Diagnosis not present

## 2021-03-02 DIAGNOSIS — R319 Hematuria, unspecified: Secondary | ICD-10-CM | POA: Diagnosis not present

## 2021-03-02 DIAGNOSIS — R102 Pelvic and perineal pain: Secondary | ICD-10-CM | POA: Diagnosis not present

## 2021-03-03 DIAGNOSIS — Z8719 Personal history of other diseases of the digestive system: Secondary | ICD-10-CM | POA: Diagnosis not present

## 2021-03-03 DIAGNOSIS — I700 Atherosclerosis of aorta: Secondary | ICD-10-CM | POA: Diagnosis not present

## 2021-03-03 DIAGNOSIS — R102 Pelvic and perineal pain: Secondary | ICD-10-CM | POA: Diagnosis not present

## 2021-03-03 DIAGNOSIS — M545 Low back pain, unspecified: Secondary | ICD-10-CM | POA: Diagnosis not present

## 2021-03-03 DIAGNOSIS — R110 Nausea: Secondary | ICD-10-CM | POA: Diagnosis not present

## 2021-03-03 DIAGNOSIS — M47816 Spondylosis without myelopathy or radiculopathy, lumbar region: Secondary | ICD-10-CM | POA: Diagnosis not present

## 2021-03-03 DIAGNOSIS — M5430 Sciatica, unspecified side: Secondary | ICD-10-CM | POA: Diagnosis not present

## 2021-03-03 DIAGNOSIS — K5730 Diverticulosis of large intestine without perforation or abscess without bleeding: Secondary | ICD-10-CM | POA: Diagnosis not present

## 2021-03-13 ENCOUNTER — Telehealth: Payer: Self-pay | Admitting: Allergy and Immunology

## 2021-03-13 NOTE — Telephone Encounter (Signed)
Called to verify whether patient would like new authorization request for the [facility].

## 2021-03-17 DIAGNOSIS — R635 Abnormal weight gain: Secondary | ICD-10-CM | POA: Diagnosis not present

## 2021-03-17 DIAGNOSIS — Z6834 Body mass index (BMI) 34.0-34.9, adult: Secondary | ICD-10-CM | POA: Diagnosis not present

## 2021-04-06 DIAGNOSIS — H6123 Impacted cerumen, bilateral: Secondary | ICD-10-CM | POA: Diagnosis not present

## 2021-04-06 DIAGNOSIS — J019 Acute sinusitis, unspecified: Secondary | ICD-10-CM | POA: Diagnosis not present

## 2021-04-06 DIAGNOSIS — H109 Unspecified conjunctivitis: Secondary | ICD-10-CM | POA: Diagnosis not present

## 2021-04-06 DIAGNOSIS — Z20828 Contact with and (suspected) exposure to other viral communicable diseases: Secondary | ICD-10-CM | POA: Diagnosis not present

## 2021-04-06 DIAGNOSIS — U071 COVID-19: Secondary | ICD-10-CM | POA: Diagnosis not present

## 2021-04-08 DIAGNOSIS — J301 Allergic rhinitis due to pollen: Secondary | ICD-10-CM | POA: Diagnosis not present

## 2021-04-08 DIAGNOSIS — G4733 Obstructive sleep apnea (adult) (pediatric): Secondary | ICD-10-CM | POA: Diagnosis not present

## 2021-04-08 DIAGNOSIS — J4540 Moderate persistent asthma, uncomplicated: Secondary | ICD-10-CM | POA: Diagnosis not present

## 2021-04-08 DIAGNOSIS — J479 Bronchiectasis, uncomplicated: Secondary | ICD-10-CM | POA: Diagnosis not present

## 2021-04-08 DIAGNOSIS — R5383 Other fatigue: Secondary | ICD-10-CM | POA: Diagnosis not present

## 2021-04-09 DIAGNOSIS — J4540 Moderate persistent asthma, uncomplicated: Secondary | ICD-10-CM | POA: Diagnosis not present

## 2021-04-15 DIAGNOSIS — E669 Obesity, unspecified: Secondary | ICD-10-CM | POA: Diagnosis not present

## 2021-04-15 DIAGNOSIS — E785 Hyperlipidemia, unspecified: Secondary | ICD-10-CM | POA: Diagnosis not present

## 2021-04-15 DIAGNOSIS — Z1331 Encounter for screening for depression: Secondary | ICD-10-CM | POA: Diagnosis not present

## 2021-04-15 DIAGNOSIS — Z9181 History of falling: Secondary | ICD-10-CM | POA: Diagnosis not present

## 2021-04-15 DIAGNOSIS — Z0000 Encounter for general adult medical examination without abnormal findings: Secondary | ICD-10-CM | POA: Diagnosis not present

## 2021-04-15 DIAGNOSIS — Z6835 Body mass index (BMI) 35.0-35.9, adult: Secondary | ICD-10-CM | POA: Diagnosis not present

## 2021-04-17 DIAGNOSIS — M1990 Unspecified osteoarthritis, unspecified site: Secondary | ICD-10-CM | POA: Diagnosis not present

## 2021-04-17 DIAGNOSIS — I10 Essential (primary) hypertension: Secondary | ICD-10-CM | POA: Diagnosis not present

## 2021-04-17 DIAGNOSIS — Z6834 Body mass index (BMI) 34.0-34.9, adult: Secondary | ICD-10-CM | POA: Diagnosis not present

## 2021-04-17 DIAGNOSIS — R635 Abnormal weight gain: Secondary | ICD-10-CM | POA: Diagnosis not present

## 2021-05-19 DIAGNOSIS — M79662 Pain in left lower leg: Secondary | ICD-10-CM | POA: Diagnosis not present

## 2021-05-19 DIAGNOSIS — R1084 Generalized abdominal pain: Secondary | ICD-10-CM | POA: Diagnosis not present

## 2021-05-19 DIAGNOSIS — I700 Atherosclerosis of aorta: Secondary | ICD-10-CM | POA: Diagnosis not present

## 2021-05-19 DIAGNOSIS — R350 Frequency of micturition: Secondary | ICD-10-CM | POA: Diagnosis not present

## 2021-05-19 DIAGNOSIS — R109 Unspecified abdominal pain: Secondary | ICD-10-CM | POA: Diagnosis not present

## 2021-05-19 DIAGNOSIS — K5790 Diverticulosis of intestine, part unspecified, without perforation or abscess without bleeding: Secondary | ICD-10-CM | POA: Diagnosis not present

## 2021-05-19 DIAGNOSIS — R000 Tachycardia, unspecified: Secondary | ICD-10-CM | POA: Diagnosis not present

## 2021-05-19 DIAGNOSIS — R32 Unspecified urinary incontinence: Secondary | ICD-10-CM | POA: Diagnosis not present

## 2021-05-19 DIAGNOSIS — M79604 Pain in right leg: Secondary | ICD-10-CM | POA: Diagnosis not present

## 2021-05-19 DIAGNOSIS — R42 Dizziness and giddiness: Secondary | ICD-10-CM | POA: Diagnosis not present

## 2021-05-19 DIAGNOSIS — K449 Diaphragmatic hernia without obstruction or gangrene: Secondary | ICD-10-CM | POA: Diagnosis not present

## 2021-05-19 DIAGNOSIS — M79605 Pain in left leg: Secondary | ICD-10-CM | POA: Diagnosis not present

## 2021-05-19 DIAGNOSIS — I4892 Unspecified atrial flutter: Secondary | ICD-10-CM | POA: Diagnosis not present

## 2021-05-19 DIAGNOSIS — R160 Hepatomegaly, not elsewhere classified: Secondary | ICD-10-CM | POA: Diagnosis not present

## 2021-05-19 DIAGNOSIS — Z7901 Long term (current) use of anticoagulants: Secondary | ICD-10-CM | POA: Diagnosis not present

## 2021-05-19 DIAGNOSIS — E041 Nontoxic single thyroid nodule: Secondary | ICD-10-CM | POA: Diagnosis not present

## 2021-05-19 DIAGNOSIS — R079 Chest pain, unspecified: Secondary | ICD-10-CM | POA: Diagnosis not present

## 2021-05-19 DIAGNOSIS — M79661 Pain in right lower leg: Secondary | ICD-10-CM | POA: Diagnosis not present

## 2021-05-19 DIAGNOSIS — Z96653 Presence of artificial knee joint, bilateral: Secondary | ICD-10-CM | POA: Diagnosis not present

## 2021-05-21 DIAGNOSIS — R635 Abnormal weight gain: Secondary | ICD-10-CM | POA: Diagnosis not present

## 2021-05-21 DIAGNOSIS — Z6836 Body mass index (BMI) 36.0-36.9, adult: Secondary | ICD-10-CM | POA: Diagnosis not present

## 2021-05-21 DIAGNOSIS — M1990 Unspecified osteoarthritis, unspecified site: Secondary | ICD-10-CM | POA: Diagnosis not present

## 2021-05-21 DIAGNOSIS — I10 Essential (primary) hypertension: Secondary | ICD-10-CM | POA: Diagnosis not present

## 2021-06-10 DIAGNOSIS — J4540 Moderate persistent asthma, uncomplicated: Secondary | ICD-10-CM | POA: Diagnosis not present

## 2021-06-10 DIAGNOSIS — J479 Bronchiectasis, uncomplicated: Secondary | ICD-10-CM | POA: Diagnosis not present

## 2021-06-10 DIAGNOSIS — R5383 Other fatigue: Secondary | ICD-10-CM | POA: Diagnosis not present

## 2021-06-10 DIAGNOSIS — J301 Allergic rhinitis due to pollen: Secondary | ICD-10-CM | POA: Diagnosis not present

## 2021-06-10 DIAGNOSIS — G4733 Obstructive sleep apnea (adult) (pediatric): Secondary | ICD-10-CM | POA: Diagnosis not present

## 2021-06-12 DIAGNOSIS — R1032 Left lower quadrant pain: Secondary | ICD-10-CM | POA: Diagnosis not present

## 2021-06-12 DIAGNOSIS — R351 Nocturia: Secondary | ICD-10-CM | POA: Diagnosis not present

## 2021-06-12 DIAGNOSIS — R350 Frequency of micturition: Secondary | ICD-10-CM | POA: Diagnosis not present

## 2021-06-12 DIAGNOSIS — N3946 Mixed incontinence: Secondary | ICD-10-CM | POA: Diagnosis not present

## 2021-06-14 DIAGNOSIS — Z711 Person with feared health complaint in whom no diagnosis is made: Secondary | ICD-10-CM | POA: Diagnosis not present

## 2021-06-14 DIAGNOSIS — G4733 Obstructive sleep apnea (adult) (pediatric): Secondary | ICD-10-CM | POA: Diagnosis not present

## 2021-06-14 DIAGNOSIS — R0683 Snoring: Secondary | ICD-10-CM | POA: Diagnosis not present

## 2021-06-17 DIAGNOSIS — E119 Type 2 diabetes mellitus without complications: Secondary | ICD-10-CM | POA: Diagnosis not present

## 2021-06-17 DIAGNOSIS — K760 Fatty (change of) liver, not elsewhere classified: Secondary | ICD-10-CM | POA: Diagnosis not present

## 2021-06-17 DIAGNOSIS — Z7901 Long term (current) use of anticoagulants: Secondary | ICD-10-CM | POA: Diagnosis not present

## 2021-06-17 DIAGNOSIS — K5790 Diverticulosis of intestine, part unspecified, without perforation or abscess without bleeding: Secondary | ICD-10-CM | POA: Diagnosis not present

## 2021-06-23 ENCOUNTER — Ambulatory Visit (HOSPITAL_COMMUNITY)
Admission: RE | Admit: 2021-06-23 | Discharge: 2021-06-23 | Disposition: A | Discharge status: Home / Self Care | Payer: Medicare Other | Source: Ambulatory Visit | Attending: Nurse Practitioner | Admitting: Nurse Practitioner

## 2021-06-23 ENCOUNTER — Other Ambulatory Visit: Payer: Self-pay

## 2021-06-23 VITALS — BP 128/76 | HR 75 | Ht 65.0 in | Wt 230.0 lb

## 2021-06-23 DIAGNOSIS — Z79899 Other long term (current) drug therapy: Secondary | ICD-10-CM | POA: Diagnosis not present

## 2021-06-23 DIAGNOSIS — E669 Obesity, unspecified: Secondary | ICD-10-CM | POA: Insufficient documentation

## 2021-06-23 DIAGNOSIS — Z6379 Other stressful life events affecting family and household: Secondary | ICD-10-CM | POA: Diagnosis not present

## 2021-06-23 DIAGNOSIS — D6869 Other thrombophilia: Secondary | ICD-10-CM

## 2021-06-23 DIAGNOSIS — I491 Atrial premature depolarization: Secondary | ICD-10-CM | POA: Diagnosis not present

## 2021-06-23 DIAGNOSIS — Z6838 Body mass index (BMI) 38.0-38.9, adult: Secondary | ICD-10-CM | POA: Diagnosis not present

## 2021-06-23 DIAGNOSIS — Z833 Family history of diabetes mellitus: Secondary | ICD-10-CM | POA: Insufficient documentation

## 2021-06-23 DIAGNOSIS — R7303 Prediabetes: Secondary | ICD-10-CM | POA: Insufficient documentation

## 2021-06-23 DIAGNOSIS — I4819 Other persistent atrial fibrillation: Secondary | ICD-10-CM | POA: Diagnosis not present

## 2021-06-23 DIAGNOSIS — Z8249 Family history of ischemic heart disease and other diseases of the circulatory system: Secondary | ICD-10-CM | POA: Diagnosis not present

## 2021-06-23 DIAGNOSIS — G4733 Obstructive sleep apnea (adult) (pediatric): Secondary | ICD-10-CM | POA: Diagnosis not present

## 2021-06-23 DIAGNOSIS — Z7901 Long term (current) use of anticoagulants: Secondary | ICD-10-CM | POA: Insufficient documentation

## 2021-06-23 DIAGNOSIS — Z96653 Presence of artificial knee joint, bilateral: Secondary | ICD-10-CM | POA: Insufficient documentation

## 2021-06-23 LAB — BASIC METABOLIC PANEL
Anion gap: 7 (ref 5–15)
BUN: 14 mg/dL (ref 8–23)
CO2: 26 mmol/L (ref 22–32)
Calcium: 9.3 mg/dL (ref 8.9–10.3)
Chloride: 104 mmol/L (ref 98–111)
Creatinine, Ser: 0.78 mg/dL (ref 0.44–1.00)
GFR, Estimated: >60 mL/min (ref >60)
Glucose, Bld: 95 mg/dL (ref 70–99)
Potassium: 4 mmol/L (ref 3.5–5.1)
Sodium: 137 mmol/L (ref 135–145)

## 2021-06-23 LAB — MAGNESIUM: Magnesium: 2.2 mg/dL (ref 1.7–2.4)

## 2021-06-23 MED ORDER — FUROSEMIDE 20 MG PO TABS: 20.0000 mg | ORAL_TABLET | Freq: Two times a day (BID) | ORAL | Status: DC

## 2021-06-23 MED ORDER — POTASSIUM CHLORIDE ER 10 MEQ PO CPCR: 10.0000 meq | ORAL_CAPSULE | Freq: Two times a day (BID) | ORAL | 2 refills | Status: DC

## 2021-06-23 NOTE — Progress Notes (Signed)
Primary Care Physician: [facility] Referring Physician:Dr. Allred   Christie Atkinson is a 66 y.o. female with a h/o persistent afib that was having  breakthrough afib with Tikosyn and had an afib ablation 03/09/18 with [name]. She also had foot surgery last summer and had more paroxysmal afib during that time.  The afib  calmed down now that her foot surgery stress has resolved. She remains  on eliquis 5 mg bid with a CHA2DS2VASc score of 3( HF, female, pre -diabetes)  F/u in afib clinic 5/12, She spent most of the winter in [place] with her husband visiting their daughter. Her husband has developed melanoma and his health is poor at this time. She  has been very  worried about him. She  had a lot of afib while in [place] and the elevation increased her issues with dyspnea. She saw a cardiologist while there and her lasix was increased as well as metoprolol added. She remains on dofetilide 125 mcg bid. An echo was done and she was told something about the pump of the heart. She had an EF of 40-45% before SR was restored with dofetilide, it did normalize with SR. She remains on eliquis 5 mg bid . She had a knee injection today and may have to have knee replacement in the future.   F/u in afib clinic, 12/02/20. She has had rt knee surgery 5/17. Just 2 short afib issues, self converted. Continues to be complaint with Tikosyn and eliquis.   F/u in afib clinic 06/23/21. She has not noted any afib. Pending colonoscopy early February and will be able to stop xarelto 2 days prior to procedure. Continues on dofetilide.   Today, she denies symptoms of palpitations, chest pain, shortness of breath, orthopnea, PND, lower extremity edema, dizziness, presyncope, syncope, or neurologic sequela. The patient is tolerating medications without difficulties and is otherwise without complaint today.   Past Medical History:  Diagnosis Date   Asthma    Atrial fibrillation ([place])    Back pain     Diabetes mellitus without complication (HCC)    Diastolic dysfunction    Dysrhythmia    a fib   Fibromyalgia    neuropathy feet   GERD (gastroesophageal reflux disease)    History of hiatal hernia    History of kidney stones    Hypertension    Knee pain    Osteoarthritis    Persistent atrial fibrillation (HCC)    Plantar fasciitis    Sleep apnea    wears oral appliance   Visit for monitoring Tikosyn therapy 02/07/2018   Past Surgical History:  Procedure Laterality Date   ABDOMINAL HYSTERECTOMY  2003   APPENDECTOMY     ATRIAL FIBRILLATION ABLATION N/A 03/09/2018   Procedure: ATRIAL FIBRILLATION ABLATION;  Surgeon: [name], MD;  Location: [facility];  Service: Cardiovascular;  Laterality: N/A;   CARDIOVERSION N/A 07/04/2017   Procedure: CARDIOVERSION;  Surgeon: [name], MD;  Location: [facility] ENDOSCOPY;  Service: Cardiovascular;  Laterality: N/A;   CARDIOVERSION N/A 09/06/2017   Procedure: CARDIOVERSION;  Surgeon: [name], MD;  Location: [facility] ENDOSCOPY;  Service: Cardiovascular;  Laterality: N/A;   CHOLECYSTECTOMY N/A 02/24/2015   Procedure: LAPAROSCOPIC CHOLECYSTECTOMY;  Surgeon: [name], MD;  Location: [facility];  Service: General;  Laterality: N/A;   EXCISION HAGLUND'S DEFORMITY WITH ACHILLES TENDON REPAIR Left 12/21/2018   Procedure: Left Achilles Tendon Debridement and Reconstruction; Excision of Haglund Deformity;  Surgeon: [name], MD;  Location: [facility];  Service: Orthopedics;  Laterality: Left;   GASTROCNEMIUS RECESSION Left 12/21/2018   Procedure: Left Gastroc Recession;  Surgeon: [name], MD;  Location: [facility];  Service: Orthopedics;  Laterality: Left;   RIGHT/LEFT HEART CATH AND CORONARY ANGIOGRAPHY N/A 08/09/2017   Procedure: RIGHT/LEFT HEART CATH AND CORONARY ANGIOGRAPHY;  Surgeon: [name], MD;  Location: [facility];  Service: Cardiovascular;  Laterality: N/A;   TEE WITHOUT  CARDIOVERSION N/A 07/04/2017   Procedure: TRANSESOPHAGEAL ECHOCARDIOGRAM (TEE);  Surgeon: [name], MD;  Location: [facility] ENDOSCOPY;  Service: Cardiovascular;  Laterality: N/A;   TEE WITHOUT CARDIOVERSION N/A 03/09/2018   Procedure: TRANSESOPHAGEAL ECHOCARDIOGRAM (TEE);  Surgeon: [name], MD;  Location: [facility] ENDOSCOPY;  Service: Cardiovascular;  Laterality: N/A;   TOTAL KNEE ARTHROPLASTY Right 10/20/2020   Procedure: TOTAL KNEE ARTHROPLASTY;  Surgeon: Vickey Huger, MD;  Location: [facility];  Service: Orthopedics;  Laterality: Right;   TOTAL KNEE ARTHROPLASTY Left 02/02/2021   Procedure: TOTAL KNEE ARTHROPLASTY;  Surgeon: Vickey Huger, MD;  Location: [facility];  Service: Orthopedics;  Laterality: Left;   VAGINAL DELIVERY     x4    Current Outpatient Medications  Medication Sig Dispense Refill   acidophilus (RISAQUAD) CAPS capsule Take 1 capsule by mouth daily as needed (intergestion).     albuterol (VENTOLIN HFA) 108 (90 Base) MCG/ACT inhaler Inhale 2 puffs into the lungs every 4 (four) hours as needed for wheezing or shortness of breath.     apixaban (ELIQUIS) 5 MG TABS tablet Take 1 tablet (5 mg total) by mouth 2 (two) times daily. 180 tablet 3   Ascorbic Acid (VITAMIN C PO) Take 1 tablet by mouth daily.     diltiazem (CARDIZEM CD) 120 MG 24 hr capsule Take 2 tablets in the AM and 1 tablet in the PM 270 capsule 2   dofetilide (TIKOSYN) 125 MCG capsule Take 1 capsule (125 mcg total) by mouth 2 (two) times daily. 180 capsule 1   fluticasone-salmeterol (ADVAIR) 250-50 MCG/ACT AEPB 1 puff 2 (two) times daily.     gabapentin (NEURONTIN) 300 MG capsule Take 1 capsule (300 mg total) by mouth 3 (three) times daily. 60 capsule 0   lidocaine (LIDODERM) 5 % Place 1 patch onto the skin daily. Remove & Discard patch within 12 hours or as directed by MD     Magnesium 400 MG CAPS Take 1 capsule by mouth at bedtime.     Magnesium Gluconate 500 (27 Mg) MG TABS Take 1 tablet (500 mg total) by mouth daily.  (Patient taking differently: Take 1 tablet by mouth at bedtime.) 30 tablet 0   metoprolol succinate (TOPROL-XL) 50 MG 24 hr tablet Take 1 tablet (50 mg total) by mouth as needed. Take with or immediately following a meal. (Patient taking differently: Take 50 mg by mouth daily as needed (breathing difficulty). Take with or immediately following a meal.)     Multiple Vitamin (MULTIVITAMIN ADULT PO) Taking Copaiba-one tablet by mouth as needed     Multiple Vitamins-Minerals (WOMENS MULTIVITAMIN) TABS Take 1 tablet by mouth daily. (Patient taking differently: Take 1 tablet by mouth daily. A-Z Doterra Supplement) 90 tablet 0   Exenatide ER (BYDUREON BCISE) 2 MG/0.85ML AUIJ Inject 2 mg into the skin once a week. (Patient not taking: Reported on 06/23/2021)     furosemide (LASIX) 20 MG tablet Take 1 tablet (20 mg total) by mouth 2 (two) times daily.     Multiple Vitamins-Minerals (MULTIVITAMIN ADULTS PO)  Take by mouth. Serenity - Taking one tablet by mouth as needed for sleep (Patient not taking: Reported on 06/23/2021)     potassium chloride (MICRO-K) 10 MEQ CR capsule Take 1 capsule (10 mEq total) by mouth 2 (two) times daily. 180 capsule 2   No current facility-administered medications for this encounter.    Allergies  Allergen Reactions   Nickel Hives and Itching   Elemental Sulfur Hives   Hydrocodone-Acetaminophen Nausea And Vomiting   Other Other (See Comments)    Polyester - including hospital gowns and sheets - allergic contact dermatitis    Social History   Socioeconomic History   Marital status: Married    Spouse name: Not on file   Number of children: 4   Years of education: Not on file   Highest education level: Not on file  Occupational History   Occupation: Disabled/retired  Tobacco Use   Smoking status: Never    Passive exposure: Yes   Smokeless tobacco: Never  Vaping Use   Vaping Use: Never used  Substance and Sexual Activity   Alcohol use: Yes    Alcohol/week: 2.0  standard drinks    Types: 2 Standard drinks or equivalent per week    Comment: little   Drug use: No   Sexual activity: Not Currently  Other Topics Concern   Not on file  Social History Narrative   Lives in [place]   Not working   Social Determinants of Health   Financial Resource Strain: Not on file  Food Insecurity: Not on file  Transportation Needs: Not on file  Physical Activity: Not on file  Stress: Not on file  Social Connections: Not on file  Intimate Partner Violence: Not on file    Family History  Problem Relation Age of Onset   Heart disease Mother    Hypertension Mother    Hyperlipidemia Mother    Diabetes Mother    Sudden death Mother    Thyroid disease Mother    Obesity Mother    Heart disease Father    Heart attack Father    Hypertension Father    Hyperlipidemia Father    Diabetes Father    Allergic rhinitis Son    Eczema Son    Allergic rhinitis Daughter    Eczema Daughter    Angioedema Neg Hx    Asthma Neg Hx    Immunodeficiency Neg Hx     ROS- All systems are reviewed and negative except as per the HPI above  Physical Exam: Vitals:   06/23/21 1037  BP: 128/76  Pulse: 75  Weight: 104.3 kg  Height: 5\' 5"  (1.651 m)   Wt Readings from Last 3 Encounters:  06/23/21 104.3 kg  02/02/21 100.7 kg  01/20/21 100.7 kg    Labs: Lab Results  Component Value Date   NA 137 06/23/2021   K 4.0 06/23/2021   CL 104 06/23/2021   CO2 26 06/23/2021   GLUCOSE 95 06/23/2021   BUN 14 06/23/2021   CREATININE 0.78 06/23/2021   CALCIUM 9.3 06/23/2021   MG 2.2 06/23/2021   Lab Results  Component Value Date   INR 1.07 08/09/2017   Lab Results  Component Value Date   CHOL 160 01/24/2019   HDL 31 (L) 01/24/2019   LDLCALC 94 01/24/2019   TRIG 174 (H) 01/24/2019    GEN- The patient is well appearing obese female, alert and oriented x 3 today.   HEENT-head normocephalic, atraumatic, sclera clear, conjunctiva pink, hearing intact, trachea  midline.  Lungs- Clear to ausculation bilaterally, normal work of breathing Heart- Regular rate and rhythm, no murmurs, rubs or gallops  GI- soft, NT, ND, + BS Extremities- no clubbing, cyanosis, or edema MS- no significant deformity or atrophy Skin- no rash or lesion Psych- euthymic mood, full affect Neuro- strength and sensation are intact  EKG- SR with  HR of  75  pr int 188 ms, qrs int 88 ms, qtc 471  ms   Echo-  1. Left ventricular ejection fraction, by estimation, is 60 to 65%. The  left ventricle has normal function. The left ventricle has no regional  wall motion abnormalities. Left ventricular diastolic parameters were  normal.   2. Right ventricular systolic function is normal. The right ventricular  size is normal. There is normal pulmonary artery systolic pressure.   3. Left atrial size was moderately dilated.   4. Cannot r/o PFO.   5. The mitral valve is degenerative. Trivial mitral valve regurgitation.  No evidence of mitral stenosis.   6. The aortic valve is tricuspid. Aortic valve regurgitation is not  visualized. Mild aortic valve sclerosis is present, with no evidence of  aortic valve stenosis.   7. The inferior vena cava is normal in size with greater than 50%  respiratory variability, suggesting right atrial pressure of 3 mmHg.    Assessment and Plan:  1. Persistent Atrial fibrillation Low afib burden  In SR today  Continue  Tikosyn, stable qtc. Continue diltiazem, metoprolol  daily Continue Eliquis 5 mg BID for CHA2DS2VASc score of 3. Bmet/mag today   2. Obesity Body mass index is 38.27 kg/m. Lifestyle modification was discussed and encouraged including regular physical activity and weight reduction. Has gained some weight over the winter months   3. OSA Now using oral appliance from Dr Ron Parker. Encouraged compliance.    Follow up with AF clinic in 6 months    [place]. Lycia Sachdeva, [facility] [address] [place], [place] [address] [phone]

## 2021-06-24 DIAGNOSIS — Z6836 Body mass index (BMI) 36.0-36.9, adult: Secondary | ICD-10-CM | POA: Diagnosis not present

## 2021-06-24 DIAGNOSIS — M1990 Unspecified osteoarthritis, unspecified site: Secondary | ICD-10-CM | POA: Diagnosis not present

## 2021-06-24 DIAGNOSIS — R635 Abnormal weight gain: Secondary | ICD-10-CM | POA: Diagnosis not present

## 2021-06-24 DIAGNOSIS — I10 Essential (primary) hypertension: Secondary | ICD-10-CM | POA: Diagnosis not present

## 2021-07-10 DIAGNOSIS — I4891 Unspecified atrial fibrillation: Secondary | ICD-10-CM | POA: Diagnosis not present

## 2021-07-10 DIAGNOSIS — K648 Other hemorrhoids: Secondary | ICD-10-CM | POA: Diagnosis not present

## 2021-07-10 DIAGNOSIS — D122 Benign neoplasm of ascending colon: Secondary | ICD-10-CM | POA: Diagnosis not present

## 2021-07-10 DIAGNOSIS — K621 Rectal polyp: Secondary | ICD-10-CM | POA: Diagnosis not present

## 2021-07-10 DIAGNOSIS — I10 Essential (primary) hypertension: Secondary | ICD-10-CM | POA: Diagnosis not present

## 2021-07-10 DIAGNOSIS — K635 Polyp of colon: Secondary | ICD-10-CM | POA: Diagnosis not present

## 2021-07-10 DIAGNOSIS — Z1211 Encounter for screening for malignant neoplasm of colon: Secondary | ICD-10-CM | POA: Diagnosis not present

## 2021-07-10 DIAGNOSIS — K5730 Diverticulosis of large intestine without perforation or abscess without bleeding: Secondary | ICD-10-CM | POA: Diagnosis not present

## 2021-07-10 DIAGNOSIS — D126 Benign neoplasm of colon, unspecified: Secondary | ICD-10-CM | POA: Diagnosis not present

## 2021-07-22 DIAGNOSIS — J479 Bronchiectasis, uncomplicated: Secondary | ICD-10-CM | POA: Diagnosis not present

## 2021-07-22 DIAGNOSIS — G4733 Obstructive sleep apnea (adult) (pediatric): Secondary | ICD-10-CM | POA: Diagnosis not present

## 2021-07-22 DIAGNOSIS — J301 Allergic rhinitis due to pollen: Secondary | ICD-10-CM | POA: Diagnosis not present

## 2021-07-22 DIAGNOSIS — R5383 Other fatigue: Secondary | ICD-10-CM | POA: Diagnosis not present

## 2021-07-22 DIAGNOSIS — J4540 Moderate persistent asthma, uncomplicated: Secondary | ICD-10-CM | POA: Diagnosis not present

## 2021-07-28 DIAGNOSIS — Z6836 Body mass index (BMI) 36.0-36.9, adult: Secondary | ICD-10-CM | POA: Diagnosis not present

## 2021-07-28 DIAGNOSIS — M1990 Unspecified osteoarthritis, unspecified site: Secondary | ICD-10-CM | POA: Diagnosis not present

## 2021-07-28 DIAGNOSIS — R635 Abnormal weight gain: Secondary | ICD-10-CM | POA: Diagnosis not present

## 2021-07-28 DIAGNOSIS — I10 Essential (primary) hypertension: Secondary | ICD-10-CM | POA: Diagnosis not present

## 2021-07-29 DIAGNOSIS — R7303 Prediabetes: Secondary | ICD-10-CM | POA: Diagnosis not present

## 2021-07-29 DIAGNOSIS — Z9989 Dependence on other enabling machines and devices: Secondary | ICD-10-CM | POA: Diagnosis not present

## 2021-07-29 DIAGNOSIS — I82409 Acute embolism and thrombosis of unspecified deep veins of unspecified lower extremity: Secondary | ICD-10-CM | POA: Insufficient documentation

## 2021-07-29 DIAGNOSIS — I4819 Other persistent atrial fibrillation: Secondary | ICD-10-CM | POA: Diagnosis not present

## 2021-07-29 DIAGNOSIS — I4891 Unspecified atrial fibrillation: Secondary | ICD-10-CM | POA: Diagnosis not present

## 2021-07-29 DIAGNOSIS — G4733 Obstructive sleep apnea (adult) (pediatric): Secondary | ICD-10-CM | POA: Insufficient documentation

## 2021-07-29 DIAGNOSIS — Z96652 Presence of left artificial knee joint: Secondary | ICD-10-CM | POA: Insufficient documentation

## 2021-07-29 DIAGNOSIS — Z9889 Other specified postprocedural states: Secondary | ICD-10-CM

## 2021-07-29 DIAGNOSIS — I824Z9 Acute embolism and thrombosis of unspecified deep veins of unspecified distal lower extremity: Secondary | ICD-10-CM | POA: Diagnosis not present

## 2021-07-29 DIAGNOSIS — J45909 Unspecified asthma, uncomplicated: Secondary | ICD-10-CM | POA: Diagnosis not present

## 2021-07-29 DIAGNOSIS — Z8679 Personal history of other diseases of the circulatory system: Secondary | ICD-10-CM | POA: Diagnosis not present

## 2021-07-29 DIAGNOSIS — R030 Elevated blood-pressure reading, without diagnosis of hypertension: Secondary | ICD-10-CM | POA: Diagnosis not present

## 2021-07-29 HISTORY — DX: Other specified postprocedural states: Z98.890

## 2021-07-29 HISTORY — DX: Obstructive sleep apnea (adult) (pediatric): G47.33

## 2021-07-29 HISTORY — DX: Acute embolism and thrombosis of unspecified deep veins of unspecified lower extremity: I82.409

## 2021-07-29 HISTORY — DX: Presence of left artificial knee joint: Z96.652

## 2021-07-30 DIAGNOSIS — I4891 Unspecified atrial fibrillation: Secondary | ICD-10-CM | POA: Diagnosis not present

## 2021-07-30 DIAGNOSIS — I499 Cardiac arrhythmia, unspecified: Secondary | ICD-10-CM | POA: Diagnosis not present

## 2021-08-03 DIAGNOSIS — R5383 Other fatigue: Secondary | ICD-10-CM | POA: Diagnosis not present

## 2021-08-03 DIAGNOSIS — E118 Type 2 diabetes mellitus with unspecified complications: Secondary | ICD-10-CM | POA: Diagnosis not present

## 2021-08-03 DIAGNOSIS — R1032 Left lower quadrant pain: Secondary | ICD-10-CM | POA: Diagnosis not present

## 2021-08-03 DIAGNOSIS — R6884 Jaw pain: Secondary | ICD-10-CM | POA: Diagnosis not present

## 2021-08-03 DIAGNOSIS — R590 Localized enlarged lymph nodes: Secondary | ICD-10-CM | POA: Diagnosis not present

## 2021-08-21 DIAGNOSIS — I4891 Unspecified atrial fibrillation: Secondary | ICD-10-CM | POA: Diagnosis not present

## 2021-08-31 ENCOUNTER — Encounter: Payer: Self-pay | Admitting: Podiatrist

## 2021-08-31 ENCOUNTER — Other Ambulatory Visit: Payer: Self-pay

## 2021-08-31 ENCOUNTER — Ambulatory Visit (INDEPENDENT_AMBULATORY_CARE_PROVIDER_SITE_OTHER): Payer: Medicare HMO | Admitting: Podiatrist

## 2021-08-31 DIAGNOSIS — B351 Tinea unguium: Secondary | ICD-10-CM

## 2021-08-31 DIAGNOSIS — E118 Type 2 diabetes mellitus with unspecified complications: Secondary | ICD-10-CM

## 2021-08-31 DIAGNOSIS — G629 Polyneuropathy, unspecified: Secondary | ICD-10-CM

## 2021-08-31 DIAGNOSIS — M205X1 Other deformities of toe(s) (acquired), right foot: Secondary | ICD-10-CM

## 2021-08-31 DIAGNOSIS — L84 Corns and callosities: Secondary | ICD-10-CM

## 2021-08-31 NOTE — Progress Notes (Signed)
?Chief Complaint  ?Patient presents with  ? Nail Problem  ?  Nail trim   ?  ? ?HPI: Patient is 66 y.o. female who presents today for a diabetic foot evaluation as well as for long nails and a callus on her right foot. She relates she has neuropathy but is not sure what it is from.she is on gabapentin and relates back problems.  She is also on a blood thinner.  ? ?Her primary care physician is at [facility]  ? ?Patient Active Problem List  ? Diagnosis Date Noted  ? S/P total knee replacement 10/20/2020  ? Persistent atrial fibrillation ([place]) 03/09/2018  ? Chronic diastolic CHF (congestive heart failure) ([place]) 02/11/2018  ? Visit for monitoring Tikosyn therapy 02/07/2018  ? Abnormal nuclear stress test 08/08/2017  ? Other persistent atrial fibrillation ([place])   ? Morbid obesity ([place]) 06/29/2017  ? Asthma 06/29/2017  ? Fibromyalgia 06/29/2017  ? Congestive heart failure ([place])   ? Preoperative cardiovascular examination 02/23/2015  ? Atrial fibrillation with RVR ([place]) 02/23/2015  ? Elevated blood pressure reading without diagnosis of hypertension 02/23/2015  ? Abdominal pain   ? Calculus of gallbladder with biliary obstruction but without cholecystitis   ? Symptomatic cholelithiasis   ? Cholelithiasis 02/22/2015  ? ? ?Current Outpatient Medications on File Prior to Visit  ?Medication Sig Dispense Refill  ? acidophilus (RISAQUAD) CAPS capsule Take 1 capsule by mouth daily as needed (intergestion).    ? albuterol (VENTOLIN HFA) 108 (90 Base) MCG/ACT inhaler Inhale 2 puffs into the lungs every 4 (four) hours as needed for wheezing or shortness of breath.    ? apixaban (ELIQUIS) 5 MG TABS tablet Take 1 tablet (5 mg total) by mouth 2 (two) times daily. 180 tablet 3  ? Ascorbic Acid (VITAMIN C PO) Take 1 tablet by mouth daily.    ? diltiazem (CARDIZEM CD) 120 MG 24 hr capsule Take 2 tablets in the AM and 1 tablet in the PM 270 capsule 2  ? dofetilide (TIKOSYN) 125 MCG capsule Take 1 capsule (125 mcg total) by mouth 2  (two) times daily. 180 capsule 1  ? Exenatide ER (BYDUREON BCISE) 2 MG/0.85ML AUIJ Inject 2 mg into the skin once a week. (Patient not taking: Reported on 06/23/2021)    ? fluticasone-salmeterol (ADVAIR) 250-50 MCG/ACT AEPB 1 puff 2 (two) times daily.    ? furosemide (LASIX) 20 MG tablet Take 1 tablet (20 mg total) by mouth 2 (two) times daily.    ? gabapentin (NEURONTIN) 300 MG capsule Take 1 capsule (300 mg total) by mouth 3 (three) times daily. 60 capsule 0  ? lidocaine (LIDODERM) 5 % Place 1 patch onto the skin daily. Remove & Discard patch within 12 hours or as directed by MD    ? Magnesium 400 MG CAPS Take 1 capsule by mouth at bedtime.    ? Magnesium Gluconate 500 (27 Mg) MG TABS Take 1 tablet (500 mg total) by mouth daily. (Patient taking differently: Take 1 tablet by mouth at bedtime.) 30 tablet 0  ? metoprolol succinate (TOPROL-XL) 50 MG 24 hr tablet Take 1 tablet (50 mg total) by mouth as needed. Take with or immediately following a meal. (Patient taking differently: Take 50 mg by mouth daily as needed (breathing difficulty). Take with or immediately following a meal.)    ? Multiple Vitamin (MULTIVITAMIN ADULT PO) Taking Copaiba-one tablet by mouth as needed    ? Multiple Vitamins-Minerals (MULTIVITAMIN ADULTS PO) Take by mouth. Serenity - Taking one  tablet by mouth as needed for sleep (Patient not taking: Reported on 06/23/2021)    ? Multiple Vitamins-Minerals (WOMENS MULTIVITAMIN) TABS Take 1 tablet by mouth daily. (Patient taking differently: Take 1 tablet by mouth daily. A-Z Doterra Supplement) 90 tablet 0  ? potassium chloride (MICRO-K) 10 MEQ CR capsule Take 1 capsule (10 mEq total) by mouth 2 (two) times daily. 180 capsule 2  ? ?No current facility-administered medications on file prior to visit.  ? ? ?Allergies  ?Allergen Reactions  ? Nickel Hives and Itching  ? Elemental Sulfur Hives  ? Hydrocodone-Acetaminophen Nausea And Vomiting  ? Other Other (See Comments)  ?  Polyester - including hospital  gowns and sheets - allergic contact dermatitis  ? ? ?Review of Systems ?No fevers, chills, nausea, muscle aches, no difficulty breathing, no calf pain, no chest pain or shortness of breath. ? ? ?Physical Exam ? ?GENERAL APPEARANCE: Alert, conversant. Appropriately groomed. No acute distress.  ? ?VASCULAR: Pedal pulses palpable DP and PT bilateral.  Capillary refill time is immediate to all digits,  Proximal to distal cooling it warm to warm.  Digital perfusion adequate.  ? ?NEUROLOGIC: sensation is intact to 5.07 monofilament at 3/5 sites bilateral.  Light touch is intact bilateral, vibratory sensation absent bilateral ? ?MUSCULOSKELETAL: acceptable muscle strength, tone and stability bilateral.  Hallux limitus with decreased range of motion of first mpj is noted right.  ? ?DERMATOLOGIC: skin is warm, supple, and dry.  Color, texture, and turgor of skin within normal limits.  No open wounds are noted.  Pre ulcreative callus is present on the plantar aspect of the hallux right foot.  Digital nails are thick, discolored, dystrophic, and clinically mycotic x 10.   ? ?Assessment  ? ?  ICD-10-CM   ?1. Peripheral polyneuropathy  G62.9   ?  ?2. Callus of foot  L84   ?  ?3. Hallux limitus, right  M20.5X1   ?  ?4. Onychomycosis  B35.1   ?  ? ? ? ?Plan ? ?Discussed exam findings and recommendations.  At todays visit I debrided the toenails with a sterile nail nipper without complication.  I also pared the callus right with a 15 blade.  I recommended diabetic shoes to help slow down the progression of the callus.  We will start the paperwork and will let her know when we can have her in for measurement.  She will be seen back in 3 months and will call if any problems arise.  ?

## 2021-08-31 NOTE — Patient Instructions (Signed)

## 2021-11-04 DIAGNOSIS — I4892 Unspecified atrial flutter: Secondary | ICD-10-CM

## 2021-11-04 HISTORY — DX: Unspecified atrial flutter: I48.92

## 2021-12-01 ENCOUNTER — Encounter: Payer: Self-pay | Admitting: Podiatrist

## 2021-12-01 ENCOUNTER — Ambulatory Visit (INDEPENDENT_AMBULATORY_CARE_PROVIDER_SITE_OTHER): Payer: Medicare HMO | Admitting: Podiatrist

## 2021-12-01 DIAGNOSIS — F341 Dysthymic disorder: Secondary | ICD-10-CM | POA: Insufficient documentation

## 2021-12-01 DIAGNOSIS — M546 Pain in thoracic spine: Secondary | ICD-10-CM | POA: Insufficient documentation

## 2021-12-01 DIAGNOSIS — M170 Bilateral primary osteoarthritis of knee: Secondary | ICD-10-CM

## 2021-12-01 DIAGNOSIS — B001 Herpesviral vesicular dermatitis: Secondary | ICD-10-CM | POA: Insufficient documentation

## 2021-12-01 DIAGNOSIS — B351 Tinea unguium: Secondary | ICD-10-CM | POA: Diagnosis not present

## 2021-12-01 DIAGNOSIS — N8000 Endometriosis of the uterus, unspecified: Secondary | ICD-10-CM

## 2021-12-01 DIAGNOSIS — R7303 Prediabetes: Secondary | ICD-10-CM

## 2021-12-01 DIAGNOSIS — G629 Polyneuropathy, unspecified: Secondary | ICD-10-CM | POA: Diagnosis not present

## 2021-12-01 DIAGNOSIS — R32 Unspecified urinary incontinence: Secondary | ICD-10-CM | POA: Insufficient documentation

## 2021-12-01 HISTORY — DX: Herpesviral vesicular dermatitis: B00.1

## 2021-12-01 HISTORY — DX: Endometriosis of the uterus, unspecified: N80.00

## 2021-12-01 HISTORY — DX: Prediabetes: R73.03

## 2021-12-01 HISTORY — DX: Bilateral primary osteoarthritis of knee: M17.0

## 2021-12-01 HISTORY — DX: Dysthymic disorder: F34.1

## 2021-12-01 HISTORY — DX: Unspecified urinary incontinence: R32

## 2021-12-01 HISTORY — DX: Pain in thoracic spine: M54.6

## 2021-12-01 NOTE — Progress Notes (Signed)
Chief Complaint  Patient presents with   Nail Problem    Trim nails    Callouses    I have some calluses on the balls of both feet     HPI: Patient is 66 y.o. female who presents today for follow up of foot and nail care.  She relates she has some pain on the back of the left heel where she had surgery in the past- she has as a small scar there still that she shaves down.  She also has tenderness at the tips of the fourth toes especially when the nails are long.   Her primary is Dr. Thalia Party at the [facility] in [place]- she saw in Jan 2023  Patient Active Problem List   Diagnosis Date Noted   Bilateral primary osteoarthritis of knee 12/01/2021   Cold sore 12/01/2021   Dysthymic disorder 12/01/2021   Endometriosis of uterus 12/01/2021   Prediabetes 12/01/2021   Pain in thoracic spine 12/01/2021   Urinary incontinence 12/01/2021   Deep vein thrombosis (DVT) ([place]) 07/29/2021   Presence of left artificial knee joint 07/29/2021   S/P ablation of atrial fibrillation 07/29/2021   S/P total knee replacement 10/20/2020   Bilateral chronic knee pain 04/22/2020   Bilateral plantar fasciitis 11/10/2018   Insertional Achilles tendinopathy 11/10/2018   Persistent atrial fibrillation ([place]) 03/09/2018   Chronic diastolic CHF (congestive heart failure) ([place]) 02/11/2018   Visit for monitoring Tikosyn therapy 02/07/2018   Abnormal nuclear stress test 08/08/2017   Other persistent atrial fibrillation ([place])    Morbid obesity ([place]) 06/29/2017   Asthma 06/29/2017   Fibromyalgia 06/29/2017   Congestive heart failure ([place])    Preoperative cardiovascular examination 02/23/2015   Atrial fibrillation with RVR ([place]) 02/23/2015   Elevated blood pressure reading without diagnosis of hypertension 02/23/2015   Abdominal pain    Calculus of gallbladder with biliary obstruction but without cholecystitis    Symptomatic cholelithiasis    Cholelithiasis 02/22/2015    Current Outpatient Medications on File Prior  to Visit  Medication Sig Dispense Refill   docusate sodium (COLACE) 100 MG capsule 100 mg by oral route.     ondansetron (ZOFRAN) 4 MG tablet 4 mg by oral route.     potassium chloride SA (KLOR-CON M) 20 MEQ tablet 20 milliequivalents by oral route.     Semaglutide-Weight Management 0.25 MG/0.5ML SOAJ INJECT 0.25 MG SUBCUTANEOUSLY WEEKLY FOR OBESITY     senna (SENOKOT) 8.6 MG tablet 17.2 mg by oral route.     acetaminophen (TYLENOL) 325 MG tablet 325 mg by oral route.     acidophilus (RISAQUAD) CAPS capsule Take 1 capsule by mouth daily as needed (intergestion).     albuterol (VENTOLIN HFA) 108 (90 Base) MCG/ACT inhaler Inhale 2 puffs into the lungs every 4 (four) hours as needed for wheezing or shortness of breath.     apixaban (ELIQUIS) 5 MG TABS tablet Take 1 tablet (5 mg total) by mouth 2 (two) times daily. 180 tablet 3   Ascorbic Acid (VITAMIN C PO) Take 1 tablet by mouth daily.     dicyclomine (BENTYL) 10 MG capsule Take 10 mg by mouth every 6 (six) hours.     diltiazem (CARDIZEM CD) 120 MG 24 hr capsule Take 2 tablets in the AM and 1 tablet in the PM 270 capsule 2   dofetilide (TIKOSYN) 125 MCG capsule Take 1 capsule (125 mcg total) by mouth 2 (two) times daily. 180 capsule 1   erythromycin ophthalmic ointment SMARTSIG:1 Inch(es) In Eye(s)  Every 4 Hours     fluticasone-salmeterol (ADVAIR) 250-50 MCG/ACT AEPB 1 puff 2 (two) times daily.     furosemide (LASIX) 20 MG tablet Take 1 tablet (20 mg total) by mouth 2 (two) times daily.     gabapentin (NEURONTIN) 300 MG capsule Take 1 capsule (300 mg total) by mouth 3 (three) times daily. 60 capsule 0   lidocaine (LIDODERM) 5 % Place 1 patch onto the skin daily. Remove & Discard patch within 12 hours or as directed by MD     Magnesium 400 MG CAPS Take 1 capsule by mouth at bedtime.     Magnesium Gluconate 500 (27 Mg) MG TABS Take 1 tablet (500 mg total) by mouth daily. (Patient taking differently: Take 1 tablet by mouth at bedtime.) 30 tablet 0    metFORMIN (GLUCOPHAGE) 500 MG tablet TK 1 T PO D WITH BREAKFAST     metoprolol succinate (TOPROL-XL) 50 MG 24 hr tablet Take 1 tablet (50 mg total) by mouth as needed. Take with or immediately following a meal. (Patient taking differently: Take 50 mg by mouth daily as needed (breathing difficulty). Take with or immediately following a meal.)     Multiple Vitamin (MULTIVITAMIN ADULT PO) Taking Copaiba-one tablet by mouth as needed     Multiple Vitamins-Minerals (MULTIVITAMIN ADULTS PO) Take by mouth. Serenity - Taking one tablet by mouth as needed for sleep (Patient not taking: Reported on 06/23/2021)     Multiple Vitamins-Minerals (WOMENS MULTIVITAMIN) TABS Take 1 tablet by mouth daily. (Patient taking differently: Take 1 tablet by mouth daily. A-Z Doterra Supplement) 90 tablet 0   pantoprazole (PROTONIX) 40 MG tablet TK 1 T PO DAILY     potassium chloride (MICRO-K) 10 MEQ CR capsule Take 1 capsule (10 mEq total) by mouth 2 (two) times daily. 180 capsule 2   No current facility-administered medications on file prior to visit.    Allergies  Allergen Reactions   Nickel Hives and Itching   Elemental Sulfur Hives   Hydrocodone-Acetaminophen Nausea And Vomiting   Other Other (See Comments)    Polyester - including hospital gowns and sheets - allergic contact dermatitis    Review of Systems No fevers, chills, nausea, muscle aches, no difficulty breathing, no calf pain, no chest pain or shortness of breath.   Physical Exam   GENERAL APPEARANCE: Alert, conversant. Appropriately groomed. No acute distress.    VASCULAR: Pedal pulses palpable DP and PT bilateral.  Capillary refill time is immediate to all digits,  Proximal to distal cooling it warm to warm.  Digital perfusion adequate.    NEUROLOGIC: sensation is intact to 5.07 monofilament at 3/5 sites bilateral.  Light touch is intact bilateral, vibratory sensation absent bilateral   MUSCULOSKELETAL: acceptable muscle strength, tone and  stability bilateral.  Hallux limitus with decreased range of motion of first mpj is noted right.    DERMATOLOGIC: skin is warm, supple, and dry.  Color, texture, and turgor of skin within normal limits.  No open wounds are noted.  Pre ulcreative callus is present on the plantar aspect of the hallux right foot. scar from a prior heel spur resection with associated small callus is present posterior left heel.  Digital nails are thick, discolored, dystrophic, and clinically mycotic x 10.     Assessment     ICD-10-CM   1. Peripheral polyneuropathy  G62.9     2. Onychomycosis  B35.1        Plan  Discussed exam findings with the patient.  Recommended a nail debridement.  This was carried out today with sterile nail nippers and a power burr without complication. Recommended she try silicone sheeting or scar away on the posterior heel spur.   Periodic routine nail debridement recommended every 3 months or as needed for follow-up.

## 2021-12-22 ENCOUNTER — Encounter (HOSPITAL_COMMUNITY): Payer: Self-pay | Admitting: Nurse Practitioner

## 2021-12-22 ENCOUNTER — Ambulatory Visit (HOSPITAL_COMMUNITY)
Admission: RE | Admit: 2021-12-22 | Discharge: 2021-12-22 | Disposition: A | Discharge status: Home / Self Care | Payer: Medicare HMO | Source: Ambulatory Visit | Attending: Nurse Practitioner | Admitting: Nurse Practitioner

## 2021-12-22 VITALS — BP 142/86 | HR 112 | Ht 65.0 in | Wt 232.8 lb

## 2021-12-22 DIAGNOSIS — G4733 Obstructive sleep apnea (adult) (pediatric): Secondary | ICD-10-CM | POA: Insufficient documentation

## 2021-12-22 DIAGNOSIS — Z7901 Long term (current) use of anticoagulants: Secondary | ICD-10-CM | POA: Diagnosis not present

## 2021-12-22 DIAGNOSIS — I4891 Unspecified atrial fibrillation: Secondary | ICD-10-CM

## 2021-12-22 DIAGNOSIS — I4892 Unspecified atrial flutter: Secondary | ICD-10-CM | POA: Diagnosis not present

## 2021-12-22 DIAGNOSIS — D6869 Other thrombophilia: Secondary | ICD-10-CM

## 2021-12-22 DIAGNOSIS — I4819 Other persistent atrial fibrillation: Secondary | ICD-10-CM | POA: Diagnosis present

## 2021-12-22 DIAGNOSIS — E669 Obesity, unspecified: Secondary | ICD-10-CM | POA: Insufficient documentation

## 2021-12-22 DIAGNOSIS — Z6838 Body mass index (BMI) 38.0-38.9, adult: Secondary | ICD-10-CM | POA: Insufficient documentation

## 2021-12-22 LAB — BASIC METABOLIC PANEL
Anion gap: 8 (ref 5–15)
BUN: 15 mg/dL (ref 8–23)
CO2: 22 mmol/L (ref 22–32)
Calcium: 9.2 mg/dL (ref 8.9–10.3)
Chloride: 110 mmol/L (ref 98–111)
Creatinine, Ser: 0.85 mg/dL (ref 0.44–1.00)
GFR, Estimated: >60 mL/min (ref >60)
Glucose, Bld: 93 mg/dL (ref 70–99)
Potassium: 4.2 mmol/L (ref 3.5–5.1)
Sodium: 140 mmol/L (ref 135–145)

## 2021-12-22 LAB — MAGNESIUM: Magnesium: 2.2 mg/dL (ref 1.7–2.4)

## 2021-12-22 MED ORDER — METOPROLOL SUCCINATE ER 50 MG PO TB24: 50.0000 mg | ORAL_TABLET | Freq: Every day | ORAL | Status: DC

## 2021-12-22 NOTE — Patient Instructions (Signed)
Take metoprolol 50mg once a day

## 2021-12-22 NOTE — Progress Notes (Signed)
Primary Care Physician: [facility], [facility] Referring Physician: previously Dr. [name] is a 66 y.o. female with a h/o persistent afib that was having  breakthrough afib with Tikosyn and had an afib ablation 03/09/18 with [name]. She also had foot surgery last summer and had more paroxysmal afib during that time.  The afib  calmed down now that her foot surgery stress has resolved. She remains  on eliquis 5 mg bid with a CHA2DS2VASc score of 3( HF, female, pre -diabetes)  F/u in afib clinic 5/12, She spent most of the winter in [place] with her husband visiting their daughter. Her husband has developed melanoma and his health is poor at this time. She  has been very  worried about him. She  had a lot of afib while in [place] and the elevation increased her issues with dyspnea. She saw a cardiologist while there and her lasix was increased as well as metoprolol added. She remains on dofetilide 125 mcg bid. An echo was done and she was told something about the pump of the heart. She had an EF of 40-45% before SR was restored with dofetilide, it did normalize with SR. She remains on eliquis 5 mg bid . She had a knee injection today and may have to have knee replacement in the future.   F/u in afib clinic, 12/02/20. She has had rt knee surgery 5/17. Just 2 short afib issues, self converted. Continues to be complaint with Tikosyn and eliquis.   F/u in afib clinic 06/23/21. She has not noted any afib. Pending colonoscopy early February and will be able to stop xarelto 2 days prior to procedure. Continues on dofetilide.   F/u in afib clinic, 12/22/21. She is in atrial flutter at 112 bpm today. She is minimally symptomatic. She states that she saw Dr. Ola Spurr,  EP,  with [facility] in [place] in February and was out of rhythm then.  He discussed a second ablation. She saw his NP 5/31 and a monitor earlier placed showed a afib burden of 26%. She remains on tikosyn. She states she has  been off her daily metoprolol for some time. She discussed staying with Dr. Ola Spurr or staying with [facility] EP's in [place] Jackalyn [place] departure. She liked the ease of seeing EP in the [place] area. She is minimally symptomatic with afib but her rate will be slower or stay in SR better if she goes back to 50 mg daily of metoprolol.   Today, she denies symptoms of palpitations, chest pain, shortness of breath, orthopnea, PND, lower extremity edema, dizziness, presyncope, syncope, or neurologic sequela. The patient is tolerating medications without difficulties and is otherwise without complaint today.   Past Medical History:  Diagnosis Date   Asthma    Atrial fibrillation ([place])    Back pain    Diabetes mellitus without complication ([place])    Diastolic dysfunction    Dysrhythmia    a fib   Fibromyalgia    neuropathy feet   GERD (gastroesophageal reflux disease)    History of hiatal hernia    History of kidney stones    Hypertension    Knee pain    Osteoarthritis    Persistent atrial fibrillation ([facility])    Plantar fasciitis    Sleep apnea    wears oral appliance   Visit for monitoring Tikosyn therapy 02/07/2018   Past Surgical History:  Procedure Laterality Date   ABDOMINAL HYSTERECTOMY  2003   APPENDECTOMY  ATRIAL FIBRILLATION ABLATION N/A 03/09/2018   Procedure: ATRIAL FIBRILLATION ABLATION;  Surgeon: [name], MD;  Location: [facility];  Service: Cardiovascular;  Laterality: N/A;   CARDIOVERSION N/A 07/04/2017   Procedure: CARDIOVERSION;  Surgeon: [name], MD;  Location: [facility] ENDOSCOPY;  Service: Cardiovascular;  Laterality: N/A;   CARDIOVERSION N/A 09/06/2017   Procedure: CARDIOVERSION;  Surgeon: [name], MD;  Location: [facility] ENDOSCOPY;  Service: Cardiovascular;  Laterality: N/A;   CHOLECYSTECTOMY N/A 02/24/2015   Procedure: LAPAROSCOPIC CHOLECYSTECTOMY;  Surgeon: [name], MD;  Location: [facility];  Service: General;  Laterality: N/A;   EXCISION  HAGLUND'S DEFORMITY WITH ACHILLES TENDON REPAIR Left 12/21/2018   Procedure: Left Achilles Tendon Debridement and Reconstruction; Excision of Haglund Deformity;  Surgeon: [name], MD;  Location: [place];  Service: Orthopedics;  Laterality: Left;   GASTROCNEMIUS RECESSION Left 12/21/2018   Procedure: Left Gastroc Recession;  Surgeon: [name], MD;  Location: [place];  Service: Orthopedics;  Laterality: Left;   RIGHT/LEFT HEART CATH AND CORONARY ANGIOGRAPHY N/A 08/09/2017   Procedure: RIGHT/LEFT HEART CATH AND CORONARY ANGIOGRAPHY;  Surgeon: [name], MD;  Location: [place] [facility];  Service: Cardiovascular;  Laterality: N/A;   TEE WITHOUT CARDIOVERSION N/A 07/04/2017   Procedure: TRANSESOPHAGEAL ECHOCARDIOGRAM (TEE);  Surgeon: [name], MD;  Location[facility] ENDOSCOPY;  Service: Cardiovascular;  Laterality: N/A;   TEE WITHOUT CARDIOVERSION N/A 03/09/2018   Procedure: TRANSESOPHAGEAL ECHOCARDIOGRAM (TEE);  Surgeon: [name], MD;  Location[facility] END[facility]COPY;  Service: Cardiovascular;  Laterality: N/A;   TOTAL KNEE ARTHROPLASTY Right 10/20/2020   Procedure: TOTAL KNEE ARTHROPLASTY;  Surgeon: Vickey Huger, MD;  Location: [facility];  Service: Orthopedics;  Laterality: Right;   TOTAL KNEE ARTHROPLASTY Left 02/02/2021   Procedure: TOTAL KNEE ARTHROPLASTY;  Surgeon: Vickey Huger, MD;  Location: [facility];  Service: Orthopedics;  Laterality: Left;   VAGINAL DELIVERY     x4    Current Outpatient Medications  Medication Sig Dispense Refill   acetaminophen (TYLENOL) 325 MG tablet Take 325 mg by mouth as needed.     acidophilus (RISAQUAD) CAPS capsule Take 1 capsule by mouth daily as needed (indigestion).     acyclovir (ZOVIRAX) 200 MG capsule Take 600 mg by mouth every morning.     albuterol (VENTOLIN HFA) 108 (90 Base) MCG/ACT inhaler Inhale 2 puffs into the lungs every 4 (four) hours as needed for wheezing or shortness of breath.     apixaban  (ELIQUIS) 5 MG TABS tablet Take 1 tablet (5 mg total) by mouth 2 (two) times daily. 180 tablet 3   dicyclomine (BENTYL) 10 MG capsule Take 10 mg by mouth every 6 (six) hours.     diltiazem (CARDIZEM CD) 120 MG 24 hr capsule Take 2 tablets in the AM and 1 tablet in the PM 270 capsule 2   dofetilide (TIKOSYN) 125 MCG capsule Take 1 capsule (125 mcg total) by mouth 2 (two) times daily. 180 capsule 1   erythromycin ophthalmic ointment SMARTSIG:1 Inch(es) In Eye(s) Every 4 Hours     fluticasone (FLONASE) 50 MCG/ACT nasal spray INSTILL 1 SPRAY IN EACH NOSTRIL TWICE A DAY     fluticasone-salmeterol (ADVAIR) 250-50 MCG/ACT AEPB 1 puff 2 (two) times daily.     furosemide (LASIX) 20 MG tablet Take 1 tablet (20 mg total) by mouth 2 (two) times daily.     lidocaine (LIDODERM) 5 % Place 1 patch onto the skin daily. Remove & Discard patch within  12 hours or as directed by MD     LIDOCAINE-MENTHOL ROLL-ON EX Apply topically as needed.     Magnesium 400 MG CAPS Take 1 capsule by mouth at bedtime. Magnesium Gluconate     metFORMIN (GLUCOPHAGE) 500 MG tablet TK 1 T PO D WITH BREAKFAST     metoprolol succinate (TOPROL-XL) 50 MG 24 hr tablet Take 1 tablet (50 mg total) by mouth as needed. Take with or immediately following a meal. (Patient taking differently: Take 100 mg by mouth daily as needed (breathing difficulty). Take with or immediately following a meal.)     Multiple Vitamin (MULTIVITAMIN ADULT PO) Taking Copaiba-one tablet by mouth as needed     Multiple Vitamins-Minerals (MULTIVITAMIN ADULTS PO) Take by mouth. Serenity - Taking one tablet by mouth as needed for sleep     Multiple Vitamins-Minerals (WOMENS MULTIVITAMIN) TABS Take 1 tablet by mouth daily. (Patient taking differently: Take 1 tablet by mouth daily. A-Z Doterra Supplement) 90 tablet 0   potassium chloride (MICRO-K) 10 MEQ CR capsule Take 1 capsule (10 mEq total) by mouth 2 (two) times daily. 180 capsule 2   potassium chloride SA (KLOR-CON M) 20  MEQ tablet 20 milliequivalents by oral route.     Semaglutide-Weight Management 0.25 MG/0.5ML SOAJ INJECT 0.25 MG SUBCUTANEOUSLY WEEKLY FOR OBESITY     Ascorbic Acid (VITAMIN C PO) Take 1 tablet by mouth daily. (Patient not taking: Reported on 12/22/2021)     gabapentin (NEURONTIN) 300 MG capsule Take 1 capsule (300 mg total) by mouth 3 (three) times daily. (Patient not taking: Reported on 12/22/2021) 60 capsule 0   No current facility-administered medications for this encounter.    Allergies  Allergen Reactions   Nickel Hives and Itching   Elemental Sulfur Hives   Hydrocodone-Acetaminophen Nausea And Vomiting   Other Other (See Comments)    Polyester - including hospital gowns and sheets - allergic contact dermatitis    Social History   Socioeconomic History   Marital status: Significant Other    Spouse name: Not on file   Number of children: 4   Years of education: Not on file   Highest education level: Not on file  Occupational History   Occupation: Disabled/retired  Tobacco Use   Smoking status: Never    Passive exposure: Yes   Smokeless tobacco: Never  Vaping Use   Vaping Use: Never used  Substance and Sexual Activity   Alcohol use: Yes    Alcohol/week: 2.0 standard drinks of alcohol    Types: 2 Standard drinks or equivalent per week    Comment: little   Drug use: No   Sexual activity: Not Currently  Other Topics Concern   Not on file  Social History Narrative   Lives in [place]   Not working   Social Determinants of Health   Financial Resource Strain: Not on file  Food Insecurity: Not on file  Transportation Needs: Not on file  Physical Activity: Not on file  Stress: Not on file  Social Connections: Not on file  Intimate Partner Violence: Not on file    Family History  Problem Relation Age of Onset   Heart disease Mother    Hypertension Mother    Hyperlipidemia Mother    Diabetes Mother    Sudden death Mother    Thyroid disease Mother    Obesity  Mother    Heart disease Father    Heart attack Father    Hypertension Father    Hyperlipidemia Father  Diabetes Father    Allergic rhinitis Son    Eczema Son    Allergic rhinitis Daughter    Eczema Daughter    Angioedema Neg Hx    Asthma Neg Hx    Immunodeficiency Neg Hx     ROS- All systems are reviewed and negative except as per the HPI above  Physical Exam: Vitals:   12/22/21 1020  BP: (!) 142/86  Pulse: (!) 112  Weight: 105.6 kg  Height: '5\' 5"'$  (1.651 m)   Wt Readings from Last 3 Encounters:  12/22/21 105.6 kg  06/23/21 104.3 kg  02/02/21 100.7 kg    Labs: Lab Results  Component Value Date   NA 137 06/23/2021   K 4.0 06/23/2021   CL 104 06/23/2021   CO2 26 06/23/2021   GLUCOSE 95 06/23/2021   BUN 14 06/23/2021   CREATININE 0.78 06/23/2021   CALCIUM 9.3 06/23/2021   MG 2.2 06/23/2021   Lab Results  Component Value Date   INR 1.07 08/09/2017   Lab Results  Component Value Date   CHOL 160 01/24/2019   HDL 31 (L) 01/24/2019   LDLCALC 94 01/24/2019   TRIG 174 (H) 01/24/2019    GEN- The patient is well appearing obese female, alert and oriented x 3 today.   HEENT-head normocephalic, atraumatic, sclera clear, conjunctiva pink, hearing intact, trachea midline. Lungs- Clear to ausculation bilaterally, normal work of breathing Heart- irregular rate and rhythm, no murmurs, rubs or gallops  GI- soft, NT, ND, + BS Extremities- no clubbing, cyanosis, or edema MS- no significant deformity or atrophy Skin- no rash or lesion Psych- euthymic mood, full affect Neuro- strength and sensation are intact  EKG- Vent. rate 112 BPM PR interval * ms QRS duration 80 ms QT/QTcB 344/469 ms P-R-T axes * 89 57 Atrial flutter with variable A-V block Abnormal ECG When compared with ECG of 23-Jun-2021 11:00, PREVIOUS ECG IS PRESENT  Echo-  1. Left ventricular ejection fraction, by estimation, is 60 to 65%. The  left ventricle has normal function. The left ventricle has  no regional  wall motion abnormalities. Left ventricular diastolic parameters were  normal.   2. Right ventricular systolic function is normal. The right ventricular  size is normal. There is normal pulmonary artery systolic pressure.   3. Left atrial size was moderately dilated.   4. Cannot r/o PFO.   5. The mitral valve is degenerative. Trivial mitral valve regurgitation.  No evidence of mitral stenosis.   6. The aortic valve is tricuspid. Aortic valve regurgitation is not  visualized. Mild aortic valve sclerosis is present, with no evidence of  aortic valve stenosis.   7. The inferior vena cava is normal in size with greater than 50%  respiratory variability, suggesting right atrial pressure of 3 mmHg.    Assessment and Plan:  1. Persistent Atrial fibrillation S/p ablation x one 2019  Has been staying well in SR until this past February when she started having ome issues with return of afib  Saw Dr. Tawny Asal EP in [place]) in February with recheck of his NP 5/31(see note in Care everywhere) A zio patch was placed in between the 2 visits that showed 26% burden afib Repeat ablation was discussed and she wants to discuss with Dr. Curt Bears since he comes to the [place] area in her trying to decide of staying with Dr. Ola Spurr or going with Dr. Curt Bears Continue  Tikosyn 125 mcg bid  Continue diltiazem, and restart 50 mg metoprolol succinate daily for better  rate control and may encourage more SR  Continue Eliquis 5 mg BID for CHA2DS2VASc score of 3. Bmet/mag today   2. Obesity Body mass index is 38.74 kg/m. Lifestyle modification was discussed and encouraged including regular physical activity and weight reduction. Has gained some weight over the winter months   3. OSA Now using oral appliance from Dr Ron Parker. Encouraged compliance.  Follow up will depend on appointment with Dr. Curt Bears, if she decides to  stay with [facility] EP then would suggest that she has total EP care  thru them and we will no longer follow her [place]. Jedediah Noda, [facility] [address] [place], [place] [address] [phone]

## 2022-01-13 ENCOUNTER — Encounter (INDEPENDENT_AMBULATORY_CARE_PROVIDER_SITE_OTHER): Payer: Self-pay

## 2022-01-25 ENCOUNTER — Encounter: Payer: Self-pay | Admitting: *Deleted

## 2022-01-25 ENCOUNTER — Encounter: Payer: Self-pay | Admitting: Cardiology

## 2022-01-25 ENCOUNTER — Ambulatory Visit (INDEPENDENT_AMBULATORY_CARE_PROVIDER_SITE_OTHER): Payer: Medicare HMO | Admitting: Cardiology

## 2022-01-25 VITALS — BP 122/80 | HR 74 | Ht 65.0 in | Wt 227.0 lb

## 2022-01-25 DIAGNOSIS — Z01812 Encounter for preprocedural laboratory examination: Secondary | ICD-10-CM

## 2022-01-25 DIAGNOSIS — I4819 Other persistent atrial fibrillation: Secondary | ICD-10-CM

## 2022-01-25 DIAGNOSIS — D6869 Other thrombophilia: Secondary | ICD-10-CM

## 2022-01-25 NOTE — Patient Instructions (Signed)
Medication Instructions:  Your physician recommends that you continue on your current medications as directed. Please refer to the Current Medication list given to you today.  *If you need a refill on your cardiac medications before your next appointment, please call your pharmacy*   Lab Work: Pre procedure labs -- see procedure instruction letter:  BMP & CBC  If you have labs (blood work) drawn today and your tests are completely normal, you will receive your results only by: MyChart Message (if you have MyChart) OR A paper copy in the mail If you have any lab test that is abnormal or we need to change your treatment, we will call you to review the results.   Testing/Procedures: Your physician has requested that you have cardiac CT within 7 days PRIOR to your ablation. Cardiac computed tomography (CT) is a painless test that uses an x-ray machine to take clear, detailed pictures of your heart.  Please follow instruction below located under "other instructions". You will get a call from our office to schedule the date for this test.  Your physician has recommended that you have an ablation. Catheter ablation is a medical procedure used to treat some cardiac arrhythmias (irregular heartbeats). During catheter ablation, a long, thin, flexible tube is put into a blood vessel in your groin (upper thigh), or neck. This tube is called an ablation catheter. It is then guided to your heart through the blood vessel. Radio frequency waves destroy small areas of heart tissue where abnormal heartbeats may cause an arrhythmia to start. Please follow instruction letter given to you today.   Follow-Up: At [facility], you and your health needs are our priority.  As part of our continuing mission to provide you with exceptional heart care, we have created designated Provider Care Teams.  These Care Teams include your primary Cardiologist (physician) and Advanced Practice Providers (APPs -  Physician  Assistants and Nurse Practitioners) who all work together to provide you with the care you need, when you need it.  Your next appointment:   1 month(s) after your ablation  The format for your next appointment:   In Person  Provider:   AFib clinic   Thank you for choosing [facility]!!   Meghin Thivierge, RN [phone]    Other Instructions   Cardiac Ablation Cardiac ablation is a procedure to destroy (ablate) some heart tissue that is sending bad signals. These bad signals cause problems in heart rhythm. The heart has many areas that make these signals. If there are problems in these areas, they can make the heart beat in a way that is not normal. Destroying some tissues can help make the heart rhythm normal. Tell your doctor about: Any allergies you have. All medicines you are taking. These include vitamins, herbs, eye drops, creams, and over-the-counter medicines. Any problems you or family members have had with medicines that make you fall asleep (anesthetics). Any blood disorders you have. Any surgeries you have had. Any medical conditions you have, such as kidney failure. Whether you are pregnant or may be pregnant. What are the risks? This is a safe procedure. But problems may occur, including: Infection. Bruising and bleeding. Bleeding into the chest. Stroke or blood clots. Damage to nearby areas of your body. Allergies to medicines or dyes. The need for a pacemaker if the normal system is damaged. Failure of the procedure to treat the problem. What happens before the procedure? Medicines Ask your doctor about: Changing or stopping your normal medicines. This   is important. Taking aspirin and ibuprofen. Do not take these medicines unless your doctor tells you to take them. Taking other medicines, vitamins, herbs, and supplements. General instructions Follow instructions from your doctor about what you cannot eat or drink. Plan to have someone take you  home from the hospital or clinic. If you will be going home right after the procedure, plan to have someone with you for 24 hours. Ask your doctor what steps will be taken to prevent infection. What happens during the procedure?  An IV tube will be put into one of your veins. You will be given a medicine to help you relax. The skin on your neck or groin will be numbed. A cut (incision) will be made in your neck or groin. A needle will be put through your cut and into a large vein. A tube (catheter) will be put into the needle. The tube will be moved to your heart. Dye may be put through the tube. This helps your doctor see your heart. Small devices (electrodes) on the tube will send out signals. A type of energy will be used to destroy some heart tissue. The tube will be taken out. Pressure will be held on your cut. This helps stop bleeding. A bandage will be put over your cut. The exact procedure may vary among doctors and hospitals. What happens after the procedure? You will be watched until you leave the hospital or clinic. This includes checking your heart rate, breathing rate, oxygen, and blood pressure. Your cut will be watched for bleeding. You will need to lie still for a few hours. Do not drive for 24 hours or as long as your doctor tells you. Summary Cardiac ablation is a procedure to destroy some heart tissue. This is done to treat heart rhythm problems. Tell your doctor about any medical conditions you may have. Tell him or her about all medicines you are taking to treat them. This is a safe procedure. But problems may occur. These include infection, bruising, bleeding, and damage to nearby areas of your body. Follow what your doctor tells you about food and drink. You may also be told to change or stop some of your medicines. After the procedure, do not drive for 24 hours or as long as your doctor tells you. This information is not intended to replace advice given to you by  your health care provider. Make sure you discuss any questions you have with your health care provider. Document Revised: 08/14/2021 Document Reviewed: 04/26/2019 Elsevier Patient Education  2023 Elsevier Inc.   

## 2022-01-25 NOTE — Progress Notes (Signed)
Electrophysiology Office Note   Date:  01/25/2022   ID:  Christie Atkinson, Christie Atkinson [birth year], MRN [id number]  PCP:  [facility]  Cardiologist:   Primary Electrophysiologist:  Kaylob Wallen Meredith Leeds, MD    Chief Complaint: AF/flutter   History of Present Illness: Christie Atkinson is a 66 y.o. female who is being seen today for the evaluation of AF/flutter at the request of [facility]. Presenting today for electrophysiology evaluation.  She has a history significant for persistent atrial fibrillation, hypertension, diabetes, sleep apnea.  She is currently on Tikosyn.  She was having breakthrough episodes and is post ablation 03/09/2018 with PVI and posterior wall isolation.  She has had more episodes of atrial fibrillation since her ablation. She presented to cardiology clinic in atrial flutter.  She has weakness, fatigue, shortness of breath when she is in atrial fibrillation and flutter.  She feels poorly when she is in these arrhythmias, but does feel that she is in and out of them quite often.  She is found no exacerbating or alleviating factors.  Today, she denies symptoms of palpitations, chest pain, shortness of breath, orthopnea, PND, lower extremity edema, claudication, dizziness, presyncope, syncope, bleeding, or neurologic sequela. The patient is tolerating medications without difficulties.    Past Medical History:  Diagnosis Date   Asthma    Atrial fibrillation ([place])    Back pain    Diabetes mellitus without complication (HCC)    Diastolic dysfunction    Dysrhythmia    a fib   Fibromyalgia    neuropathy feet   GERD (gastroesophageal reflux disease)    History of hiatal hernia    History of kidney stones    Hypertension    Knee pain    Osteoarthritis    Persistent atrial fibrillation (HCC)    Plantar fasciitis    Sleep apnea    wears oral appliance   Visit for monitoring Tikosyn therapy 02/07/2018   Past Surgical History:  Procedure Laterality  Date   ABDOMINAL HYSTERECTOMY  2003   APPENDECTOMY     ATRIAL FIBRILLATION ABLATION N/A 03/09/2018   Procedure: ATRIAL FIBRILLATION ABLATION;  Surgeon: [name], MD;  Location: [facility];  Service: Cardiovascular;  Laterality: N/A;   CARDIOVERSION N/A 07/04/2017   Procedure: CARDIOVERSION;  Surgeon: [name], MD;  Location: [facility] ENDOSCOPY;  Service: Cardiovascular;  Laterality: N/A;   CARDIOVERSION N/A 09/06/2017   Procedure: CARDIOVERSION;  Surgeon: [name], MD;  Location: [facility] ENDOSCOPY;  Service: Cardiovascular;  Laterality: N/A;   CHOLECYSTECTOMY N/A 02/24/2015   Procedure: LAPAROSCOPIC CHOLECYSTECTOMY;  Surgeon: [name], MD;  Location: [place];  Service: General;  Laterality: N/A;   EXCISION HAGLUND'S DEFORMITY WITH ACHILLES TENDON REPAIR Left 12/21/2018   Procedure: Left Achilles Tendon Debridement and Reconstruction; Excision of Haglund Deformity;  Surgeon: [name], MD;  Location: [place];  Service: Orthopedics;  Laterality: Left;   GASTROCNEMIUS RECESSION Left 12/21/2018   Procedure: Left Gastroc Recession;  Surgeon: [name], MD;  Location: [place];  Service: Orthopedics;  Laterality: Left;   RIGHT/LEFT HEART CATH AND CORONARY ANGIOGRAPHY N/A 08/09/2017   Procedure: RIGHT/LEFT HEART CATH AND CORONARY ANGIOGRAPHY;  Surgeon: [name], MD;  Location: [place] [facility];  Service: Cardiovascular;  Laterality: N/A;   TEE WITHOUT CARDIOVERSION N/A 07/04/2017   Procedure: TRANSESOPHAGEAL ECHOCARDIOGRAM (TEE);  Surgeon: [name], MD;  Location: [facility] ENDOSCOPY;  Service: Cardiovascular;  Laterality: N/A;   TEE WITHOUT CARDIOVERSION  N/A 03/09/2018   Procedure: TRANSESOPHAGEAL ECHOCARDIOGRAM (TEE);  Surgeon: [name], MD;  Location: [facility] ENDOSCOPY;  Service: Cardiovascular;  Laterality: N/A;   TOTAL KNEE ARTHROPLASTY Right 10/20/2020   Procedure: TOTAL KNEE ARTHROPLASTY;  Surgeon: Vickey Huger, MD;  Location:  [facility];  Service: Orthopedics;  Laterality: Right;   TOTAL KNEE ARTHROPLASTY Left 02/02/2021   Procedure: TOTAL KNEE ARTHROPLASTY;  Surgeon: Vickey Huger, MD;  Location: [facility];  Service: Orthopedics;  Laterality: Left;   VAGINAL DELIVERY     x4     Current Outpatient Medications  Medication Sig Dispense Refill   acetaminophen (TYLENOL) 325 MG tablet Take 325 mg by mouth as needed.     acidophilus (RISAQUAD) CAPS capsule Take 1 capsule by mouth daily as needed (indigestion).     acyclovir (ZOVIRAX) 200 MG capsule Take 600 mg by mouth every morning.     albuterol (VENTOLIN HFA) 108 (90 Base) MCG/ACT inhaler Inhale 2 puffs into the lungs every 4 (four) hours as needed for wheezing or shortness of breath.     apixaban (ELIQUIS) 5 MG TABS tablet Take 1 tablet (5 mg total) by mouth 2 (two) times daily. 180 tablet 3   Ascorbic Acid (VITAMIN C PO) Take 1 tablet by mouth daily.     dicyclomine (BENTYL) 10 MG capsule Take 10 mg by mouth every 6 (six) hours.     diltiazem (CARDIZEM CD) 120 MG 24 hr capsule Take 2 tablets in the AM and 1 tablet in the PM 270 capsule 2   dofetilide (TIKOSYN) 125 MCG capsule Take 1 capsule (125 mcg total) by mouth 2 (two) times daily. 180 capsule 1   erythromycin ophthalmic ointment SMARTSIG:1 Inch(es) In Eye(s) Every 4 Hours     fluticasone-salmeterol (ADVAIR) 250-50 MCG/ACT AEPB 1 puff 2 (two) times daily.     furosemide (LASIX) 20 MG tablet Take 1 tablet (20 mg total) by mouth 2 (two) times daily.     lidocaine (LIDODERM) 5 % Place 1 patch onto the skin daily. Remove & Discard patch within 12 hours or as directed by MD     LIDOCAINE-MENTHOL ROLL-ON EX Apply topically as needed.     Magnesium 400 MG CAPS Take 1 capsule by mouth at bedtime. Magnesium Gluconate     metoprolol succinate (TOPROL-XL) 50 MG 24 hr tablet Take 1 tablet (50 mg total) by mouth daily. Take with or immediately following a meal.     Multiple Vitamin (MULTIVITAMIN ADULT PO) Taking Copaiba-one tablet  by mouth as needed     Multiple Vitamins-Minerals (MULTIVITAMIN ADULTS PO) Take by mouth. Serenity - Taking one tablet by mouth as needed for sleep     Multiple Vitamins-Minerals (WOMENS MULTIVITAMIN) TABS Take 1 tablet by mouth daily. (Patient taking differently: Take 1 tablet by mouth daily. A-Z Doterra Supplement) 90 tablet 0   potassium chloride (MICRO-K) 10 MEQ CR capsule Take 1 capsule (10 mEq total) by mouth 2 (two) times daily. 180 capsule 2   Semaglutide-Weight Management 0.25 MG/0.5ML SOAJ INJECT 0.25 MG SUBCUTANEOUSLY WEEKLY FOR OBESITY     fluticasone (FLONASE) 50 MCG/ACT nasal spray INSTILL 1 SPRAY IN EACH NOSTRIL TWICE A DAY     No current facility-administered medications for this visit.    Allergies:   Nickel, Elemental sulfur, Hydrocodone-acetaminophen, Other, and Oxycodone-acetaminophen   Social History:  The patient  reports that she has never smoked. She has been exposed to tobacco smoke. She has never used smokeless tobacco. She reports current alcohol use of about  2.0 standard drinks of alcohol per week. She reports that she does not use drugs.   Family History:  The patient's family history includes Allergic rhinitis in her daughter and son; Diabetes in her father and mother; Eczema in her daughter and son; Heart attack in her father; Heart disease in her father and mother; Hyperlipidemia in her father and mother; Hypertension in her father and mother; Obesity in her mother; Sudden death in her mother; Thyroid disease in her mother.    ROS:  Please see the history of present illness.   Otherwise, review of systems is positive for none.   All other systems are reviewed and negative.    PHYSICAL EXAM: VS:  BP 122/80   Pulse 74   Ht '5\' 5"'$  (1.651 m)   Wt 227 lb (103 kg)   SpO2 95%   BMI 37.77 kg/m  , BMI Body mass index is 37.77 kg/m. GEN: Well nourished, well developed, in no acute distress  HEENT: normal  Neck: no JVD, carotid bruits, or masses Cardiac: RRR; no  murmurs, rubs, or gallops,no edema  Respiratory:  clear to auscultation bilaterally, normal work of breathing GI: soft, nontender, nondistended, + BS MS: no deformity or atrophy  Skin: warm and dry Neuro:  Strength and sensation are intact Psych: euthymic mood, full affect  EKG:  EKG is not ordered today. Personal review of the ekg ordered 12/22/21 shows atrial flutter, rate 112  Recent Labs: 02/03/2021: Hemoglobin 11.8; Platelets 238 12/22/2021: BUN 15; Creatinine, Ser 0.85; Magnesium 2.2; Potassium 4.2; Sodium 140    Lipid Panel     Component Value Date/Time   CHOL 160 01/24/2019 1313   TRIG 174 (H) 01/24/2019 1313   HDL 31 (L) 01/24/2019 1313   CHOLHDL 5.4 06/26/2017 0517   VLDL 15 06/26/2017 0517   LDLCALC 94 01/24/2019 1313     Wt Readings from Last 3 Encounters:  01/25/22 227 lb (103 kg)  12/22/21 232 lb 12.8 oz (105.6 kg)  06/23/21 230 lb (104.3 kg)      Other studies Reviewed: Additional studies/ records that were reviewed today include: TTE 06/11/20  Review of the above records today demonstrates:   1. Left ventricular ejection fraction, by estimation, is 60 to 65%. The  left ventricle has normal function. The left ventricle has no regional  wall motion abnormalities. Left ventricular diastolic parameters were  normal.   2. Right ventricular systolic function is normal. The right ventricular  size is normal. There is normal pulmonary artery systolic pressure.   3. Left atrial size was moderately dilated.   4. Cannot r/o PFO.   5. The mitral valve is degenerative. Trivial mitral valve regurgitation.  No evidence of mitral stenosis.   6. The aortic valve is tricuspid. Aortic valve regurgitation is not  visualized. Mild aortic valve sclerosis is present, with no evidence of  aortic valve stenosis.   7. The inferior vena cava is normal in size with greater than 50%  respiratory variability, suggesting right atrial pressure of 3 mmHg.    ASSESSMENT AND  PLAN:  1.  In atrial fibrillation/flutter: Currently on dofetilide 125 mcg twice daily, Eliquis 5 mg twice daily.  CHA2DS2-VASc of 3.  Post ablation in 2019.  She has had more frequent episodes of atrial fibrillation and atrial flutter.  She wore a Zio patch that showed a 26% burden.  She would prefer an alternative rhythm control strategy as she feels poorly in atrial fibrillation and flutter.  We Wally Shevchenko plan for  repeat ablation.  Risk, benefits, and alternatives to EP study and radiofrequency ablation for afib were also discussed in detail today. These risks include but are not limited to stroke, bleeding, vascular damage, tamponade, perforation, damage to the esophagus, lungs, and other structures, pulmonary vein stenosis, worsening renal function, and death. The patient understands these risk and wishes to proceed.  We Lowry Bala therefore proceed with catheter ablation at the next available time.  Carto, ICE, anesthesia are requested for the procedure.  Tnya Ades also obtain CT PV protocol prior to the procedure to exclude LAA thrombus and further evaluate atrial anatomy.   2.  Obesity: Lifestyle modification encouraged Body mass index is 37.77 kg/m.  3.  Obstructive sleep apnea: Using an oral device.  Compliance encouraged  3.  Secondary hypercoagulable state: Currently on Eliquis for atrial fibrillation as above  Current medicines are reviewed at length with the patient today.   The patient does not have concerns regarding her medicines.  The following changes were made today:  none  Labs/ tests ordered today include:  Orders Placed This Encounter  Procedures   CT CARDIAC MORPH/PULM VEIN W/CM&W/O CA SCORE   Basic metabolic panel   CBC     Disposition:   FU with Fatina Sprankle 3 months  Signed, Chele Cornell Meredith Leeds, MD  01/25/2022 11:04 AM     [address] [place] [address] [phone] (office) [phone] (fax)

## 2022-01-26 ENCOUNTER — Encounter (HOSPITAL_COMMUNITY): Payer: Self-pay

## 2022-03-04 ENCOUNTER — Ambulatory Visit (INDEPENDENT_AMBULATORY_CARE_PROVIDER_SITE_OTHER): Payer: Medicare HMO | Admitting: Podiatry

## 2022-03-04 DIAGNOSIS — B351 Tinea unguium: Secondary | ICD-10-CM | POA: Diagnosis not present

## 2022-03-04 DIAGNOSIS — M545 Low back pain, unspecified: Secondary | ICD-10-CM

## 2022-03-04 DIAGNOSIS — L84 Corns and callosities: Secondary | ICD-10-CM | POA: Diagnosis not present

## 2022-03-04 DIAGNOSIS — G629 Polyneuropathy, unspecified: Secondary | ICD-10-CM | POA: Diagnosis not present

## 2022-03-04 HISTORY — DX: Low back pain, unspecified: M54.50

## 2022-03-06 ENCOUNTER — Encounter: Payer: Self-pay | Admitting: Podiatry

## 2022-03-06 NOTE — Progress Notes (Signed)
  Subjective:  Patient ID: Christie Atkinson, female    DOB: April 05, 1956,  MRN: [id number]  Christie Atkinson presents to clinic today for:  Chief Complaint  Patient presents with   Nail Problem    Routine foot care   New problem(s): None.   PCP is [facility] , and last visit was  August, 2023.  Allergies  Allergen Reactions   Nickel Hives and Itching   Elemental Sulfur Hives   Hydrocodone-Acetaminophen Nausea And Vomiting   Other Other (See Comments)    Polyester - including hospital gowns and sheets - allergic contact dermatitis   Oxycodone-Acetaminophen     Other reaction(s): Not available    Review of Systems: Negative except as noted in the HPI.  Objective: No changes noted in today's physical examination.  Christie Atkinson is a pleasant 66 y.o. female WD, WN in NAD. AAO x 3.  Vascular Examination: Capillary refill time immediate b/l.Vascular status intact b/l with palpable pedal pulses. Pedal hair present b/l. No edema. No pain with calf compression b/l. Skin temperature gradient WNL b/l.   Neurological Examination: Protective sensation decreased with 10 gram monofilament b/l. Vibratory sensation diminished b/l.  Dermatological Examination: Pedal skin with normal turgor, texture and tone b/l. Toenails 1-5 b/l thick, discolored, elongated with subungual debris and pain on dorsal palpation. Preulcerative lesion noted transverse lesion left forefoot and plantar IPJ of right great toe. There is visible subdermal hemorrhage. There is no surrounding erythema, no edema, no drainage, no odor, no fluctuance.  Musculoskeletal Examination: Muscle strength 5/5 to all lower extremity muscle groups bilaterally. No gross bony deformities bilaterally. Limited joint ROM to the 1st MPJ right foot.  Radiographs: None  Assessment/Plan: 1. Onychomycosis   2. Pre-ulcerative calluses   3. Peripheral polyneuropathy     No orders of the defined types were placed in this  encounter.   -Patient was evaluated and treated. All patient's and/or POA's questions/concerns answered on today's visit. -Patient to continue soft, supportive shoe gear daily. -Toenails 1-5 b/l were debrided in length and girth with sterile nail nippers and dremel without iatrogenic bleeding.  -Preulcerative lesion pared transverse forefoot left foot and plantar IPJ of right great toe utilizing sterile scalpel blade. Total number pared=2. -Patient/POA to call should there be question/concern in the interim.   Return in about 3 months (around 06/03/2022).  Marzetta Board, DPM

## 2022-04-05 ENCOUNTER — Other Ambulatory Visit (HOSPITAL_COMMUNITY): Payer: Self-pay | Admitting: *Deleted

## 2022-04-05 ENCOUNTER — Other Ambulatory Visit (HOSPITAL_COMMUNITY): Payer: Self-pay

## 2022-04-05 MED ORDER — DOFETILIDE 125 MCG PO CAPS: 125.0000 ug | ORAL_CAPSULE | Freq: Two times a day (BID) | ORAL | 1 refills | Status: DC

## 2022-05-13 ENCOUNTER — Ambulatory Visit (HOSPITAL_BASED_OUTPATIENT_CLINIC_OR_DEPARTMENT_OTHER)
Admission: RE | Admit: 2022-05-13 | Discharge: 2022-05-13 | Disposition: A | Discharge status: Home / Self Care | Payer: Medicare HMO | Source: Ambulatory Visit | Attending: Cardiology | Admitting: Cardiology

## 2022-05-13 ENCOUNTER — Encounter (HOSPITAL_BASED_OUTPATIENT_CLINIC_OR_DEPARTMENT_OTHER): Payer: Self-pay

## 2022-05-13 DIAGNOSIS — I4819 Other persistent atrial fibrillation: Secondary | ICD-10-CM | POA: Diagnosis present

## 2022-05-13 LAB — POCT I-STAT CREATININE: Creatinine, Ser: 0.9 mg/dL (ref 0.44–1.00)

## 2022-05-13 MED ORDER — IOHEXOL 350 MG/ML SOLN
100.0000 mL | Freq: Once | INTRAVENOUS | Status: AC | PRN
  Administered 2022-05-13: 80 mL via INTRAVENOUS

## 2022-05-14 ENCOUNTER — Telehealth: Payer: Self-pay | Admitting: Cardiology

## 2022-05-14 NOTE — Telephone Encounter (Signed)
Patient has ablation schedule for 12/15, she wants to make sure she has everything in order for her procedure.  She got her CT scan yesterday.  She is not sure if she needs to come in for blood work. Please advise.

## 2022-05-14 NOTE — Telephone Encounter (Signed)
Left message for patient to call back  

## 2022-05-18 NOTE — Telephone Encounter (Signed)
Pt returned my call. She will stop by the [facility] office today/tomorrow for blood work. She will pick up ablation instruction letter of instructions while she is there. She appreciates my callback on this matter.

## 2022-05-18 NOTE — Telephone Encounter (Signed)
Left message to call back  

## 2022-05-19 LAB — BASIC METABOLIC PANEL
BUN/Creatinine Ratio: 16 (ref 12–28)
BUN: 14 mg/dL (ref 8–27)
CO2: 21 mmol/L (ref 20–29)
Calcium: 9.7 mg/dL (ref 8.7–10.3)
Chloride: 104 mmol/L (ref 96–106)
Creatinine, Ser: 0.9 mg/dL (ref 0.57–1.00)
Glucose: 101 mg/dL — ABNORMAL HIGH (ref 70–99)
Potassium: 4.1 mmol/L (ref 3.5–5.2)
Sodium: 139 mmol/L (ref 134–144)
eGFR: 70 mL/min/{1.73_m2} (ref >59)

## 2022-05-19 LAB — CBC
Hematocrit: 41.8 % (ref 34.0–46.6)
Hemoglobin: 14.4 g/dL (ref 11.1–15.9)
MCH: 31.2 pg (ref 26.6–33.0)
MCHC: 34.4 g/dL (ref 31.5–35.7)
MCV: 91 fL (ref 79–97)
Platelets: 272 10*3/uL (ref 150–450)
RBC: 4.62 x10E6/uL (ref 3.77–5.28)
RDW: 12.4 % (ref 11.7–15.4)
WBC: 7.1 10*3/uL (ref 3.4–10.8)

## 2022-05-20 NOTE — Pre-Procedure Instructions (Signed)
Attempted to call patient regarding procedure instructions.  Left voicemail on the following items: Arrival time 1100 Nothing to eat or drink after midnight No meds AM of procedure Responsible person to drive you home and stay with you for 24 hrs Wash with special soap night before and morning of procedure If on anti-coagulant drug instructions Eliquis- if you have missed any doses please let office know right away.

## 2022-05-21 ENCOUNTER — Encounter (HOSPITAL_COMMUNITY)
Admission: RE | Disposition: A | Discharge status: Home / Self Care | Payer: Self-pay | Source: Home / Self Care | Attending: Cardiology

## 2022-05-21 ENCOUNTER — Ambulatory Visit (HOSPITAL_BASED_OUTPATIENT_CLINIC_OR_DEPARTMENT_OTHER): Payer: Medicare HMO | Admitting: Anesthesiology

## 2022-05-21 ENCOUNTER — Ambulatory Visit (HOSPITAL_COMMUNITY): Payer: Medicare HMO | Admitting: Anesthesiology

## 2022-05-21 ENCOUNTER — Other Ambulatory Visit: Payer: Self-pay

## 2022-05-21 ENCOUNTER — Ambulatory Visit (HOSPITAL_COMMUNITY)
Admission: RE | Admit: 2022-05-21 | Discharge: 2022-05-21 | Disposition: A | Discharge status: Home / Self Care | Payer: Medicare HMO | Attending: Cardiology | Admitting: Cardiology

## 2022-05-21 DIAGNOSIS — G4733 Obstructive sleep apnea (adult) (pediatric): Secondary | ICD-10-CM | POA: Diagnosis not present

## 2022-05-21 DIAGNOSIS — K219 Gastro-esophageal reflux disease without esophagitis: Secondary | ICD-10-CM | POA: Diagnosis not present

## 2022-05-21 DIAGNOSIS — G4730 Sleep apnea, unspecified: Secondary | ICD-10-CM | POA: Diagnosis not present

## 2022-05-21 DIAGNOSIS — I4892 Unspecified atrial flutter: Secondary | ICD-10-CM | POA: Diagnosis not present

## 2022-05-21 DIAGNOSIS — J45909 Unspecified asthma, uncomplicated: Secondary | ICD-10-CM | POA: Diagnosis not present

## 2022-05-21 DIAGNOSIS — I110 Hypertensive heart disease with heart failure: Secondary | ICD-10-CM | POA: Diagnosis not present

## 2022-05-21 DIAGNOSIS — Z7722 Contact with and (suspected) exposure to environmental tobacco smoke (acute) (chronic): Secondary | ICD-10-CM | POA: Diagnosis not present

## 2022-05-21 DIAGNOSIS — Z9989 Dependence on other enabling machines and devices: Secondary | ICD-10-CM | POA: Diagnosis not present

## 2022-05-21 DIAGNOSIS — I4891 Unspecified atrial fibrillation: Secondary | ICD-10-CM

## 2022-05-21 DIAGNOSIS — Z7984 Long term (current) use of oral hypoglycemic drugs: Secondary | ICD-10-CM | POA: Diagnosis not present

## 2022-05-21 DIAGNOSIS — E119 Type 2 diabetes mellitus without complications: Secondary | ICD-10-CM | POA: Diagnosis not present

## 2022-05-21 DIAGNOSIS — I4819 Other persistent atrial fibrillation: Secondary | ICD-10-CM | POA: Insufficient documentation

## 2022-05-21 DIAGNOSIS — I509 Heart failure, unspecified: Secondary | ICD-10-CM | POA: Diagnosis not present

## 2022-05-21 HISTORY — PX: ATRIAL FIBRILLATION ABLATION: EP1191

## 2022-05-21 LAB — GLUCOSE, CAPILLARY
Glucose-Capillary: 89 mg/dL (ref 70–99)
Glucose-Capillary: 104 mg/dL — ABNORMAL HIGH (ref 70–99)

## 2022-05-21 LAB — POCT ACTIVATED CLOTTING TIME
Activated Clotting Time: 282 seconds
Activated Clotting Time: 320 seconds

## 2022-05-21 SURGERY — ATRIAL FIBRILLATION ABLATION
Anesthesia: General

## 2022-05-21 MED ORDER — PROPOFOL 10 MG/ML IV BOLUS
INTRAVENOUS | Status: DC | PRN
  Administered 2022-05-21: 50 mg via INTRAVENOUS
  Administered 2022-05-21 (×2): 40 mg via INTRAVENOUS
  Administered 2022-05-21: 30 mg via INTRAVENOUS
  Administered 2022-05-21: 150 mg via INTRAVENOUS

## 2022-05-21 MED ORDER — ONDANSETRON HCL 4 MG/2ML IJ SOLN
INTRAMUSCULAR | Status: DC | PRN
  Administered 2022-05-21: 4 mg via INTRAVENOUS

## 2022-05-21 MED ORDER — ONDANSETRON HCL 4 MG/2ML IJ SOLN: 4.0000 mg | Freq: Four times a day (QID) | INTRAMUSCULAR | Status: DC | PRN

## 2022-05-21 MED ORDER — HEPARIN (PORCINE) IN NACL 1000-0.9 UT/500ML-% IV SOLN
INTRAVENOUS | Status: DC | PRN
  Administered 2022-05-21 (×3): 500 mL

## 2022-05-21 MED ORDER — HEPARIN SODIUM (PORCINE) 1000 UNIT/ML IJ SOLN
INTRAMUSCULAR | Status: DC | PRN
  Administered 2022-05-21: 1000 [IU] via INTRAVENOUS

## 2022-05-21 MED ORDER — PHENYLEPHRINE HCL-NACL 20-0.9 MG/250ML-% IV SOLN
INTRAVENOUS | Status: DC | PRN
  Administered 2022-05-21: 25 ug/min via INTRAVENOUS

## 2022-05-21 MED ORDER — SODIUM CHLORIDE 0.9 % IV SOLN: 250.0000 mL | INTRAVENOUS | Status: DC | PRN

## 2022-05-21 MED ORDER — PHENYLEPHRINE 80 MCG/ML (10ML) SYRINGE FOR IV PUSH (FOR BLOOD PRESSURE SUPPORT)
PREFILLED_SYRINGE | INTRAVENOUS | Status: DC | PRN
  Administered 2022-05-21: 160 ug via INTRAVENOUS

## 2022-05-21 MED ORDER — HEPARIN SODIUM (PORCINE) 1000 UNIT/ML IJ SOLN
INTRAMUSCULAR | Status: AC
  Filled 2022-05-21: qty 10

## 2022-05-21 MED ORDER — ACETAMINOPHEN 500 MG PO TABS
ORAL_TABLET | ORAL | Status: AC
  Administered 2022-05-21: 1000 mg via ORAL
  Filled 2022-05-21: qty 2

## 2022-05-21 MED ORDER — LIDOCAINE 2% (20 MG/ML) 5 ML SYRINGE
INTRAMUSCULAR | Status: DC | PRN
  Administered 2022-05-21: 100 mg via INTRAVENOUS

## 2022-05-21 MED ORDER — FENTANYL CITRATE (PF) 250 MCG/5ML IJ SOLN
INTRAMUSCULAR | Status: DC | PRN
  Administered 2022-05-21 (×2): 25 ug via INTRAVENOUS
  Administered 2022-05-21: 50 ug via INTRAVENOUS

## 2022-05-21 MED ORDER — SODIUM CHLORIDE 0.9% FLUSH: 3.0000 mL | Freq: Two times a day (BID) | INTRAVENOUS | Status: DC

## 2022-05-21 MED ORDER — SUGAMMADEX SODIUM 200 MG/2ML IV SOLN
INTRAVENOUS | Status: DC | PRN
  Administered 2022-05-21: 200 mg via INTRAVENOUS

## 2022-05-21 MED ORDER — PROTAMINE SULFATE 10 MG/ML IV SOLN
INTRAVENOUS | Status: DC | PRN
  Administered 2022-05-21: 40 mg via INTRAVENOUS

## 2022-05-21 MED ORDER — SODIUM CHLORIDE 0.9 % IV SOLN: INTRAVENOUS | Status: DC

## 2022-05-21 MED ORDER — HEPARIN SODIUM (PORCINE) 1000 UNIT/ML IJ SOLN
INTRAMUSCULAR | Status: DC | PRN
  Administered 2022-05-21: 14000 [IU] via INTRAVENOUS
  Administered 2022-05-21: 5000 [IU] via INTRAVENOUS

## 2022-05-21 MED ORDER — ROCURONIUM BROMIDE 10 MG/ML (PF) SYRINGE
PREFILLED_SYRINGE | INTRAVENOUS | Status: DC | PRN
  Administered 2022-05-21: 50 mg via INTRAVENOUS

## 2022-05-21 MED ORDER — SODIUM CHLORIDE 0.9% FLUSH: 3.0000 mL | INTRAVENOUS | Status: DC | PRN

## 2022-05-21 MED ORDER — ACETAMINOPHEN 500 MG PO TABS: 1000.0000 mg | ORAL_TABLET | Freq: Once | ORAL | Status: AC

## 2022-05-21 MED ORDER — DEXAMETHASONE SODIUM PHOSPHATE 10 MG/ML IJ SOLN
INTRAMUSCULAR | Status: DC | PRN
  Administered 2022-05-21: 5 mg via INTRAVENOUS

## 2022-05-21 SURGICAL SUPPLY — 18 items
CATH 8FR REPROCESSED SOUNDSTAR (CATHETERS) ×1 IMPLANT
CATH 8FR SOUNDSTAR REPROCESSED (CATHETERS) IMPLANT
CATH ABLAT QDOT MICRO BI TC DF (CATHETERS) IMPLANT
CATH OCTARAY 2.0 F 3-3-3-3-3 (CATHETERS) IMPLANT
CATH PIGTAIL STEERABLE D1 8.7 (WIRE) IMPLANT
CATH S-M CIRCA TEMP PROBE (CATHETERS) IMPLANT
CATH WEBSTER BI DIR CS D-F CRV (CATHETERS) IMPLANT
CLOSURE PERCLOSE PROSTYLE (VASCULAR PRODUCTS) IMPLANT
PACK EP LATEX FREE (CUSTOM PROCEDURE TRAY) ×1
PACK EP LF (CUSTOM PROCEDURE TRAY) ×1 IMPLANT
PAD DEFIB RADIO PHYSIO CONN (PAD) ×1 IMPLANT
PATCH CARTO3 (PAD) IMPLANT
SHEATH CARTO VIZIGO SM CVD (SHEATH) IMPLANT
SHEATH PINNACLE 7F 10CM (SHEATH) IMPLANT
SHEATH PINNACLE 8F 10CM (SHEATH) IMPLANT
SHEATH PINNACLE 9F 10CM (SHEATH) IMPLANT
SHEATH PROBE COVER 6X72 (BAG) IMPLANT
TUBING SMART ABLATE COOLFLOW (TUBING) IMPLANT

## 2022-05-21 NOTE — Anesthesia Preprocedure Evaluation (Addendum)
Anesthesia Evaluation  Patient identified by MRN, date of birth, ID band Patient awake    Reviewed: Allergy & Precautions, NPO status , Patient's Chart, lab work & pertinent test results, reviewed documented beta blocker date and time   History of Anesthesia Complications Negative for: history of anesthetic complications  Airway Mallampati: II   Neck ROM: Full    Dental no notable dental hx. (+) Dental Advisory Given   Pulmonary asthma , sleep apnea and Continuous Positive Airway Pressure Ventilation    Pulmonary exam normal        Cardiovascular hypertension, Pt. on medications and Pt. on home beta blockers +CHF  + dysrhythmias (s/p ablation 2019, on Eliquis ) Atrial Fibrillation  Rhythm:Irregular Rate:Normal  IMPRESSIONS     1. Left ventricular ejection fraction, by estimation, is 60 to 65%. The  left ventricle has normal function. The left ventricle has no regional  wall motion abnormalities. Left ventricular diastolic parameters were  normal.   2. Right ventricular systolic function is normal. The right ventricular  size is normal. There is normal pulmonary artery systolic pressure.   3. Left atrial size was moderately dilated.   4. Cannot r/o PFO.   5. The mitral valve is degenerative. Trivial mitral valve regurgitation.  No evidence of mitral stenosis.   6. The aortic valve is tricuspid. Aortic valve regurgitation is not  visualized. Mild aortic valve sclerosis is present, with no evidence of  aortic valve stenosis.   7. The inferior vena cava is normal in size with greater than 50%  respiratory variability, suggesting right atrial pressure of 3 mmHg.      Neuro/Psych  PSYCHIATRIC DISORDERS  Depression    negative neurological ROS     GI/Hepatic Neg liver ROS, hiatal hernia,GERD  ,,  Endo/Other  diabetes, Type 2, Oral Hypoglycemic Agents    Renal/GU negative Renal ROS  negative genitourinary    Musculoskeletal  (+) Arthritis , Osteoarthritis,  Fibromyalgia -  Abdominal   Peds  Hematology negative hematology ROS (+)   Anesthesia Other Findings   Reproductive/Obstetrics                             Anesthesia Physical Anesthesia Plan  ASA: 3  Anesthesia Plan: General   Post-op Pain Management:  Regional for Post-op pain and Tylenol PO (pre-op)*   Induction: Intravenous  PONV Risk Score and Plan: 3 and Ondansetron, Dexamethasone and Midazolam  Airway Management Planned: Oral ETT  Additional Equipment: None  Intra-op Plan:   Post-operative Plan: Extubation in OR  Informed Consent: I have reviewed the patients History and Physical, chart, labs and discussed the procedure including the risks, benefits and alternatives for the proposed anesthesia with the patient or authorized representative who has indicated his/her understanding and acceptance.     Dental advisory given  Plan Discussed with: Anesthesiologist and CRNA  Anesthesia Plan Comments: (See APP note by Durel Salts, FNP   ECHO 01/22: 1. Left ventricular ejection fraction, by estimation, is 60 to 65%. The  left ventricle has normal function. The left ventricle has no regional  wall motion abnormalities. Left ventricular diastolic parameters were  normal.  2. Right ventricular systolic function is normal. The right ventricular  size is normal. There is normal pulmonary artery systolic pressure.  3. Left atrial size was moderately dilated.  4. Cannot r/o PFO.  5. The mitral valve is degenerative. Trivial mitral valve regurgitation.  No evidence of mitral stenosis.  6. The aortic valve is tricuspid. Aortic valve regurgitation is not  visualized. Mild aortic valve sclerosis is present, with no evidence of  aortic valve stenosis.  7. The inferior vena cava is normal in size with greater than 50%  respiratory variability, suggesting right atrial pressure of 3 mmHg.   Lab  Results      Component                Value               Date                      WBC                      6.2                 01/20/2021                HGB                      13.2                01/20/2021                HCT                      41.7                01/20/2021                MCV                      93.5                01/20/2021                PLT                      256                 01/20/2021           Lab Results      Component                Value               Date                      NA                       137                 01/20/2021                K                        4.9                 01/20/2021                CO2                      27                  01/20/2021  GLUCOSE                  97                  01/20/2021                BUN                      17                  01/20/2021                CREATININE               0.73                01/20/2021                CALCIUM                  9.0                 01/20/2021                GFRNONAA                 >60                 01/20/2021                GFRAA                    >60                 02/01/2020           )        Anesthesia Quick Evaluation

## 2022-05-21 NOTE — H&P (Signed)
Electrophysiology Office Note   Date:  05/21/2022   ID:  Christie, Atkinson 07/08/1955, MRN [id number]  PCP:  [facility]  Cardiologist:   Primary Electrophysiologist:  Jaleil Renwick Meredith Leeds, MD    Chief Complaint: AF/flutter   History of Present Illness: Christie Atkinson is a 66 y.o. female who is being seen today for the evaluation of AF/flutter at the request of No ref. provider found. Presenting today for electrophysiology evaluation.  She has a history significant for persistent atrial fibrillation, hypertension, diabetes, sleep apnea.  She is currently on Tikosyn.  She was having breakthrough episodes and is post ablation 03/09/2018 with PVI and posterior wall isolation.  She has had more episodes of atrial fibrillation since her ablation. She presented to cardiology clinic in atrial flutter.  She has weakness, fatigue, shortness of breath when she is in atrial fibrillation and flutter.  She feels poorly when she is in these arrhythmias, but does feel that she is in and out of them quite often.  She is found no exacerbating or alleviating factors.  Today, denies symptoms of palpitations, chest pain, shortness of breath, orthopnea, PND, lower extremity edema, claudication, dizziness, presyncope, syncope, bleeding, or neurologic sequela. The patient is tolerating medications without difficulties. Plan ablation today.    Past Medical History:  Diagnosis Date   Asthma    Atrial fibrillation ([place])    Back pain    Diabetes mellitus without complication (HCC)    Diastolic dysfunction    Dysrhythmia    a fib   Fibromyalgia    neuropathy feet   GERD (gastroesophageal reflux disease)    History of hiatal hernia    History of kidney stones    Hypertension    Knee pain    Osteoarthritis    Persistent atrial fibrillation (HCC)    Plantar fasciitis    Sleep apnea    wears oral appliance   Visit for monitoring Tikosyn therapy 02/07/2018   Past Surgical History:   Procedure Laterality Date   ABDOMINAL HYSTERECTOMY  2003   APPENDECTOMY     ATRIAL FIBRILLATION ABLATION N/A 03/09/2018   Procedure: ATRIAL FIBRILLATION ABLATION;  Surgeon: [name], MD;  Location: [facility];  Service: Cardiovascular;  Laterality: N/A;   CARDIOVERSION N/A 07/04/2017   Procedure: CARDIOVERSION;  Surgeon: [name], MD;  Location: [facility] END[facility]COPY;  Service: Cardiovascular;  Laterality: N/A;   CARDIOVERSION N/A 09/06/2017   Procedure: CARDIOVERSION;  Surgeon: [name], MD;  Location: [facility] ENDOSCOPY;  Service: Cardiovascular;  Laterality: N/A;   CHOLECYSTECTOMY N/A 02/24/2015   Procedure: LAPAROSCOPIC CHOLECYSTECTOMY;  Surgeon: [name], MD;  Location: [facility];  Service: General;  Laterality: N/A;   EXCISION HAGLUND'S DEFORMITY WITH ACHILLES TENDON REPAIR Left 12/21/2018   Procedure: Left Achilles Tendon Debridement and Reconstruction; Excision of Haglund Deformity;  Surgeon: [name], MD;  Location: [place] [facility];  Service: Orthopedics;  Laterality: Left;   GASTROCNEMIUS RECESSION Left 12/21/2018   Procedure: Left Gastroc Recession;  Surgeon: [name], MD;  Location: [facility];  Service: Orthopedics;  Laterality: Left;   RIGHT/LEFT HEART CATH AND CORONARY ANGIOGRAPHY N/A 08/09/2017   Procedure: RIGHT/LEFT HEART CATH AND CORONARY ANGIOGRAPHY;  Surgeon: [name], MD;  Location: [facility];  Service: Cardiovascular;  Laterality: N/A;   TEE WITHOUT CARDIOVERSION N/A 07/04/2017   Procedure: TRANSESOPHAGEAL ECHOCARDIOGRAM (TEE);  Surgeon: [name], MD;  Location: [place];  Service: Cardiovascular;  Laterality: N/A;  TEE WITHOUT CARDIOVERSION N/A 03/09/2018   Procedure: TRANSESOPHAGEAL ECHOCARDIOGRAM (TEE);  Surgeon: [name], MD;  Location: [facility] ENDOSCOPY;  Service: Cardiovascular;  Laterality: N/A;   TOTAL KNEE ARTHROPLASTY Right 10/20/2020   Procedure: TOTAL KNEE ARTHROPLASTY;  Surgeon:  Vickey Huger, MD;  Location: [facility];  Service: Orthopedics;  Laterality: Right;   TOTAL KNEE ARTHROPLASTY Left 02/02/2021   Procedure: TOTAL KNEE ARTHROPLASTY;  Surgeon: Vickey Huger, MD;  Location: [facility];  Service: Orthopedics;  Laterality: Left;   VAGINAL DELIVERY     x4     Current Facility-Administered Medications  Medication Dose Route Frequency Provider Last Rate Last Admin   0.9 %  sodium chloride infusion   Intravenous Continuous Keino Placencia Hassell Done, MD       acetaminophen (TYLENOL) 500 MG tablet            acetaminophen (TYLENOL) tablet 1,000 mg  1,000 mg Oral Once Duane Boston, MD        Allergies:   Nickel, Elemental sulfur, Hydrocodone-acetaminophen, Other, and Oxycodone-acetaminophen   Social History:  The patient  reports that she has never smoked. She has been exposed to tobacco smoke. She has never used smokeless tobacco. She reports current alcohol use of about 2.0 standard drinks of alcohol per week. She reports that she does not use drugs.   Family History:  The patient's family history includes Allergic rhinitis in her daughter and son; Diabetes in her father and mother; Eczema in her daughter and son; Heart attack in her father; Heart disease in her father and mother; Hyperlipidemia in her father and mother; Hypertension in her father and mother; Obesity in her mother; Sudden death in her mother; Thyroid disease in her mother.   ROS:  Please see the history of present illness.   Otherwise, review of systems is positive for none.   All other systems are reviewed and negative.   PHYSICAL EXAM: VS:  There were no vitals taken for this visit. , BMI There is no height or weight on file to calculate BMI. GEN: Well nourished, well developed, in no acute distress  HEENT: normal  Neck: no JVD, carotid bruits, or masses Cardiac: RRR; no murmurs, rubs, or gallops,no edema  Respiratory:  clear to auscultation bilaterally, normal work of breathing GI: soft, nontender,  nondistended, + BS MS: no deformity or atrophy  Skin: warm and dry Neuro:  Strength and sensation are intact Psych: euthymic mood, full affect  Recent Labs: 12/22/2021: Magnesium 2.2 05/18/2022: BUN 14; Creatinine, Ser 0.90; Hemoglobin 14.4; Platelets 272; Potassium 4.1; Sodium 139    Lipid Panel     Component Value Date/Time   CHOL 160 01/24/2019 1313   TRIG 174 (H) 01/24/2019 1313   HDL 31 (L) 01/24/2019 1313   CHOLHDL 5.4 06/26/2017 0517   VLDL 15 06/26/2017 0517   LDLCALC 94 01/24/2019 1313     Wt Readings from Last 3 Encounters:  01/25/22 103 kg  12/22/21 105.6 kg  06/23/21 104.3 kg      Other studies Reviewed: Additional studies/ records that were reviewed today include: TTE 06/11/20  Review of the above records today demonstrates:   1. Left ventricular ejection fraction, by estimation, is 60 to 65%. The  left ventricle has normal function. The left ventricle has no regional  wall motion abnormalities. Left ventricular diastolic parameters were  normal.   2. Right ventricular systolic function is normal. The right ventricular  size is normal. There is normal pulmonary artery systolic pressure.   3.  Left atrial size was moderately dilated.   4. Cannot r/o PFO.   5. The mitral valve is degenerative. Trivial mitral valve regurgitation.  No evidence of mitral stenosis.   6. The aortic valve is tricuspid. Aortic valve regurgitation is not  visualized. Mild aortic valve sclerosis is present, with no evidence of  aortic valve stenosis.   7. The inferior vena cava is normal in size with greater than 50%  respiratory variability, suggesting right atrial pressure of 3 mmHg.    ASSESSMENT AND PLAN:  1.  In atrial fibrillation/flutter: Christie Atkinson has presented today for surgery, with the diagnosis of AF.  The various methods of treatment have been discussed with the patient and family. After consideration of risks, benefits and other options for treatment, the patient  has consented to  Procedure(s): Catheter ablation as a surgical intervention .  Risks include but not limited to complete heart block, stroke, esophageal damage, nerve damage, bleeding, vascular damage, tamponade, perforation, MI, and death. The patient's history has been reviewed, patient examined, no change in status, stable for surgery.  I have reviewed the patient's chart and labs.  Questions were answered to the patient's satisfaction.    Christie Boultinghouse Curt Bears, MD 05/21/2022 11:36 AM

## 2022-05-21 NOTE — Anesthesia Procedure Notes (Signed)
Procedure Name: Intubation Date/Time: 05/21/2022 12:30 PM  Performed by: Lorie Phenix, CRNAPre-anesthesia Checklist: Patient identified, Emergency Drugs available, Suction available and Patient being monitored Patient Re-evaluated:Patient Re-evaluated prior to induction Oxygen Delivery Method: Circle system utilized Preoxygenation: Pre-oxygenation with 100% oxygen Induction Type: IV induction Ventilation: Mask ventilation without difficulty Laryngoscope Size: Mac and 3 Grade View: Grade I Tube type: Oral Tube size: 7.5 mm Number of attempts: 1 Airway Equipment and Method: Stylet Placement Confirmation: ETT inserted through vocal cords under direct vision, positive ETCO2 and breath sounds checked- equal and bilateral Secured at: 22 cm Tube secured with: Tape Dental Injury: Teeth and Oropharynx as per pre-operative assessment

## 2022-05-21 NOTE — Discharge Instructions (Signed)

## 2022-05-21 NOTE — Transfer of Care (Signed)
Immediate Anesthesia Transfer of Care Note  Patient: Christie Atkinson  Procedure(s) Performed: ATRIAL FIBRILLATION ABLATION  Patient Location: Cath Lab  Anesthesia Type:General  Level of Consciousness: awake and alert   Airway & Oxygen Therapy: Patient Spontanous Breathing and Patient connected to nasal cannula oxygen  Post-op Assessment: Report given to RN and Post -op Vital signs reviewed and stable  Post vital signs: Reviewed and stable  Last Vitals:  Vitals Value Taken Time  BP 115/74 05/21/22 1417  Temp 36.8 C 05/21/22 1414  Pulse 119 05/21/22 1418  Resp 18 05/21/22 1418  SpO2 97 % 05/21/22 1418  Vitals shown include unvalidated device data.  Last Pain:  Vitals:   05/21/22 1414  TempSrc: Temporal  PainSc: 0-No pain         Complications: There were no known notable events for this encounter.

## 2022-05-22 ENCOUNTER — Telehealth: Payer: Self-pay | Admitting: Cardiology

## 2022-05-22 NOTE — Telephone Encounter (Signed)
Patient called the answering service this AM stating that her throat has been sore since she had her afib ablation procedure yesterday. She had an ablation in 2019, and believes that the anesthesiologist gave her a "throat saver" pill to take after being intubated. She would like a prescription for that pill. She is unable to recall the name of the medication that was given to her. She called her pharmacy to ask for the name of the medication, but they do not have a record of it.   Today, patient has been using tylenol and lozenges, but she continues to have throat soreness. I recommended that she try a lidocaine throat spray such as Chloraseptic to try to relieve some of the pain, but patient declined. She is adamant that she needs the same pill she was given in 2019. I thoroughly looked through her medication list, but I was unable to find a pill that would be used for throat soreness. Patient asked to speak to the anesthesiologist who did her procedure yesterday, but I told her that the anesthesiology department usually does not take outpatient phone calls. She asked me for the phone number to [facility], which I provided.   Margie Billet, PA-C 05/22/2022 10:33 AM

## 2022-05-22 NOTE — Anesthesia Postprocedure Evaluation (Signed)
Anesthesia Post Note  Patient: Christie Atkinson  Procedure(s) Performed: ATRIAL FIBRILLATION ABLATION     Patient location during evaluation: Cath Lab Anesthesia Type: General Level of consciousness: awake and alert Pain management: pain level controlled Vital Signs Assessment: post-procedure vital signs reviewed and stable Respiratory status: spontaneous breathing, nonlabored ventilation and respiratory function stable Cardiovascular status: blood pressure returned to baseline, stable and tachycardic Postop Assessment: no apparent nausea or vomiting Anesthetic complications: no  There were no known notable events for this encounter.  Last Vitals:  Vitals:   05/21/22 1700 05/21/22 1750  BP: 109/84 (!) 131/98  Pulse: (!) 138 (!) 136  Resp: 17 (!) 21  Temp:    SpO2: 98% 97%    Last Pain:  Vitals:   05/21/22 1500  TempSrc:   PainSc: 0-No pain                 Aswad Wandrey,W. EDMOND

## 2022-05-24 ENCOUNTER — Encounter (HOSPITAL_COMMUNITY): Payer: Self-pay | Admitting: Cardiology

## 2022-05-26 ENCOUNTER — Telehealth: Payer: Self-pay | Admitting: Cardiology

## 2022-05-26 ENCOUNTER — Ambulatory Visit (HOSPITAL_COMMUNITY)
Admission: RE | Admit: 2022-05-26 | Discharge: 2022-05-26 | Disposition: A | Discharge status: Home / Self Care | Payer: Medicare HMO | Source: Ambulatory Visit | Attending: Physician Assistant | Admitting: Physician Assistant

## 2022-05-26 VITALS — BP 86/62 | HR 133 | Ht 65.0 in | Wt 209.2 lb

## 2022-05-26 DIAGNOSIS — Z79899 Other long term (current) drug therapy: Secondary | ICD-10-CM | POA: Insufficient documentation

## 2022-05-26 DIAGNOSIS — Z7901 Long term (current) use of anticoagulants: Secondary | ICD-10-CM | POA: Insufficient documentation

## 2022-05-26 DIAGNOSIS — I484 Atypical atrial flutter: Secondary | ICD-10-CM | POA: Insufficient documentation

## 2022-05-26 DIAGNOSIS — I4819 Other persistent atrial fibrillation: Secondary | ICD-10-CM | POA: Insufficient documentation

## 2022-05-26 DIAGNOSIS — G4733 Obstructive sleep apnea (adult) (pediatric): Secondary | ICD-10-CM | POA: Insufficient documentation

## 2022-05-26 DIAGNOSIS — D6869 Other thrombophilia: Secondary | ICD-10-CM

## 2022-05-26 DIAGNOSIS — Z6834 Body mass index (BMI) 34.0-34.9, adult: Secondary | ICD-10-CM | POA: Diagnosis not present

## 2022-05-26 DIAGNOSIS — E669 Obesity, unspecified: Secondary | ICD-10-CM | POA: Diagnosis not present

## 2022-05-26 HISTORY — DX: Other thrombophilia: D68.69

## 2022-05-26 NOTE — Progress Notes (Signed)
Primary Care Physician: [facility] Referring Physician: previously [name] Primary EP: Dr Georgiana Spinner is a 66 y.o. female with a h/o persistent afib that was having  breakthrough afib with Tikosyn and had an afib ablation 03/09/18 with [name]. She also had foot surgery last summer and had more paroxysmal afib during that time.  The afib  calmed down now that her foot surgery stress has resolved. She remains  on eliquis 5 mg bid with a CHA2DS2VASc score of 3( HF, female, pre -diabetes)  F/u in afib clinic 5/12, She spent most of the winter in [place] with her husband visiting their daughter. Her husband has developed melanoma and his health is poor at this time. She  has been very  worried about him. She  had a lot of afib while in [place] and the elevation increased her issues with dyspnea. She saw a cardiologist while there and her lasix was increased as well as metoprolol added. She remains on dofetilide 125 mcg bid. An echo was done and she was told something about the pump of the heart. She had an EF of 40-45% before SR was restored with dofetilide, it did normalize with SR. She remains on eliquis 5 mg bid . She had a knee injection today and may have to have knee replacement in the future.   F/u in afib clinic, 12/02/20. She has had rt knee surgery 5/17. Just 2 short afib issues, self converted. Continues to be complaint with Tikosyn and eliquis.   F/u in afib clinic 06/23/21. She has not noted any afib. Pending colonoscopy early February and will be able to stop xarelto 2 days prior to procedure. Continues on dofetilide.   F/u in afib clinic, 12/22/21. She is in atrial flutter at 112 bpm today. She is minimally symptomatic. She states that she saw Dr. Ola Spurr,  EP,  with [facility] in [place] in February and was out of rhythm then.  He discussed a second ablation. She saw his NP 5/31 and a monitor earlier placed showed a afib burden of 26%. She remains on  tikosyn. She states she has been off her daily metoprolol for some time. She discussed staying with Dr. Ola Spurr or staying with [facility] EP's in Dr. Jackalyn [place] departure. She liked the ease of seeing EP in the [place] area. She is minimally symptomatic with afib but her rate will be slower or stay in SR better if she goes back to 50 mg daily of metoprolol.   Follow up in the AF clinic 05/26/22. Patient is s/p afib ablation 05/21/22 with Dr Curt Bears. Unfortunately, she went into afib during the ablation and did not convert. She is in rapid atrial flutter today feeling tired and a little lightheaded. She denies chest pain or SOB. She has some groin tenderness on the left side but this predated her ablation.   Today, she denies symptoms of palpitations, chest pain, shortness of breath, orthopnea, PND, lower extremity edema, presyncope, syncope, or neurologic sequela. The patient is tolerating medications without difficulties and is otherwise without complaint today.   Past Medical History:  Diagnosis Date   Asthma    Atrial fibrillation ([place])    Back pain    Diabetes mellitus without complication (HCC)    Diastolic dysfunction    Dysrhythmia    a fib   Fibromyalgia    neuropathy feet   GERD (gastroesophageal reflux disease)    History of hiatal hernia    History of  kidney stones    Hypertension    Knee pain    Osteoarthritis    Persistent atrial fibrillation (HCC)    Plantar fasciitis    Sleep apnea    wears oral appliance   Visit for monitoring Tikosyn therapy 02/07/2018   Past Surgical History:  Procedure Laterality Date   ABDOMINAL HYSTERECTOMY  2003   APPENDECTOMY     ATRIAL FIBRILLATION ABLATION N/A 03/09/2018   Procedure: ATRIAL FIBRILLATION ABLATION;  Surgeon: [name], MD;  Location: [facility];  Service: Cardiovascular;  Laterality: N/A;   ATRIAL FIBRILLATION ABLATION N/A 05/21/2022   Procedure: ATRIAL FIBRILLATION ABLATION;  Surgeon: Constance Haw,  MD;  Location: [facility];  Service: Cardiovascular;  Laterality: N/A;   CARDIOVERSION N/A 07/04/2017   Procedure: CARDIOVERSION;  Surgeon: [name], MD;  Location: [facility] ENDOSCOPY;  Service: Cardiovascular;  Laterality: N/A;   CARDIOVERSION N/A 09/06/2017   Procedure: CARDIOVERSION;  Surgeon: [name], MD;  Location: [facility] ENDOSCOPY;  Service: Cardiovascular;  Laterality: N/A;   CHOLECYSTECTOMY N/A 02/24/2015   Procedure: LAPAROSCOPIC CHOLECYSTECTOMY;  Surgeon: [name], MD;  Location: [facility];  Service: General;  Laterality: N/A;   EXCISION HAGLUND'S DEFORMITY WITH ACHILLES TENDON REPAIR Left 12/21/2018   Procedure: Left Achilles Tendon Debridement and Reconstruction; Excision of Haglund Deformity;  Surgeon: [name], MD;  Location: [facility];  Service: Orthopedics;  Laterality: Left;   GASTROCNEMIUS RECESSION Left 12/21/2018   Procedure: Left Gastroc Recession;  Surgeon: [name], MD;  Location: [facility];  Service: Orthopedics;  Laterality: Left;   RIGHT/LEFT HEART CATH AND CORONARY ANGIOGRAPHY N/A 08/09/2017   Procedure: RIGHT/LEFT HEART CATH AND CORONARY ANGIOGRAPHY;  Surgeon: [name], MD;  Location: [facility];  Service: Cardiovascular;  Laterality: N/A;   TEE WITHOUT CARDIOVERSION N/A 07/04/2017   Procedure: TRANSESOPHAGEAL ECHOCARDIOGRAM (TEE);  Surgeon: [name], MD;  Location: [facility] ENDOSCOPY;  Service: Cardiovascular;  Laterality: N/A;   TEE WITHOUT CARDIOVERSION N/A 03/09/2018   Procedure: TRANSESOPHAGEAL ECHOCARDIOGRAM (TEE);  Surgeon: [name], MD;  Location: [facility] ENDOSCOPY;  Service: Cardiovascular;  Laterality: N/A;   TOTAL KNEE ARTHROPLASTY Right 10/20/2020   Procedure: TOTAL KNEE ARTHROPLASTY;  Surgeon: Vickey Huger, MD;  Location: [facility];  Service: Orthopedics;  Laterality: Right;   TOTAL KNEE ARTHROPLASTY Left 02/02/2021   Procedure: TOTAL KNEE ARTHROPLASTY;  Surgeon: Vickey Huger, MD;  Location:  [facility];  Service: Orthopedics;  Laterality: Left;   VAGINAL DELIVERY     x4    Current Outpatient Medications  Medication Sig Dispense Refill   acetaminophen (TYLENOL) 500 MG tablet Take 500 mg by mouth 3 (three) times daily as needed for moderate pain.     acyclovir (ZOVIRAX) 200 MG capsule Take 200-800 mg by mouth 3 (three) times daily as needed (fever blisters).     albuterol (VENTOLIN HFA) 108 (90 Base) MCG/ACT inhaler Inhale 2-3 puffs into the lungs every 4 (four) hours as needed for wheezing or shortness of breath.     apixaban (ELIQUIS) 5 MG TABS tablet Take 1 tablet (5 mg total) by mouth 2 (two) times daily. 180 tablet 3   diltiazem (CARDIZEM CD) 120 MG 24 hr capsule Take 2 tablets in the AM and 1 tablet in the PM 270 capsule 2   dofetilide (TIKOSYN) 125 MCG capsule Take 1 capsule (125 mcg total) by mouth 2 (two) times daily. 180 capsule 1   erythromycin ophthalmic ointment Place 1 Application into both  eyes every 4 (four) hours as needed (eye infections).     fluticasone (FLONASE) 50 MCG/ACT nasal spray Place 1 spray into both nostrils 2 (two) times daily.     fluticasone-salmeterol (ADVAIR) 250-50 MCG/ACT AEPB Inhale 1 puff into the lungs 2 (two) times daily.     furosemide (LASIX) 20 MG tablet Take 1 tablet (20 mg total) by mouth 2 (two) times daily. (Patient taking differently: Take 20-40 mg by mouth See admin instructions. Take 40 mg daily, may take another 20 mg dose as needed for swelling)     lidocaine (LIDODERM) 5 % Place 1-3 patches onto the skin daily. Remove & Discard patch within 12 hours or as directed by MD     LIDOCAINE-MENTHOL ROLL-ON EX Apply 1 Application topically as needed (pain).     metFORMIN (GLUCOPHAGE-XR) 500 MG 24 hr tablet Take 500 mg by mouth daily.     metoprolol succinate (TOPROL-XL) 100 MG 24 hr tablet Take 50 mg by mouth daily as needed (palpitations / feel weakness). Take with or immediately following a meal.     potassium chloride (MICRO-K) 10 MEQ CR  capsule Take 1 capsule (10 mEq total) by mouth 2 (two) times daily. (Patient taking differently: Take 10-20 mEq by mouth See admin instructions. Take 20 meq, may take another 10 meq dose as needed for swelling (when taking 3rd tablet of lasix)) 180 capsule 2   Semaglutide-Weight Management (WEGOVY) 2.4 MG/0.75ML SOAJ Inject 2.4 mg into the skin every Sunday.     metoprolol succinate (TOPROL-XL) 50 MG 24 hr tablet Take 1 tablet (50 mg total) by mouth daily. Take with or immediately following a meal. (Patient not taking: Reported on 05/26/2022)     No current facility-administered medications for this encounter.    Allergies  Allergen Reactions   Nickel Hives and Itching   Elemental Sulfur Hives   Hydrocodone-Acetaminophen Nausea And Vomiting   Other Other (See Comments)    Polyester - including hospital gowns and sheets - allergic contact dermatitis   Oxycodone-Acetaminophen     Unknown reaction     Social History   Socioeconomic History   Marital status: Significant Other    Spouse name: Not on file   Number of children: 4   Years of education: Not on file   Highest education level: Not on file  Occupational History   Occupation: Disabled/retired  Tobacco Use   Smoking status: Never    Passive exposure: Yes   Smokeless tobacco: Never  Vaping Use   Vaping Use: Never used  Substance and Sexual Activity   Alcohol use: Yes    Alcohol/week: 2.0 standard drinks of alcohol    Types: 2 Standard drinks or equivalent per week    Comment: little   Drug use: No   Sexual activity: Not Currently  Other Topics Concern   Not on file  Social History Narrative   Lives in [place]   Not working   Social Determinants of Health   Financial Resource Strain: Not on file  Food Insecurity: Not on file  Transportation Needs: Not on file  Physical Activity: Not on file  Stress: Not on file  Social Connections: Not on file  Intimate Partner Violence: Not on file    Family History   Problem Relation Age of Onset   Heart disease Mother    Hypertension Mother    Hyperlipidemia Mother    Diabetes Mother    Sudden death Mother    Thyroid disease Mother  Obesity Mother    Heart disease Father    Heart attack Father    Hypertension Father    Hyperlipidemia Father    Diabetes Father    Allergic rhinitis Son    Eczema Son    Allergic rhinitis Daughter    Eczema Daughter    Angioedema Neg Hx    Asthma Neg Hx    Immunodeficiency Neg Hx     ROS- All systems are reviewed and negative except as per the HPI above  Physical Exam: Vitals:   05/26/22 1413  BP: (!) 86/62  Pulse: (!) 133  Weight: 94.9 kg  Height: '5\' 5"'$  (1.651 m)   Wt Readings from Last 3 Encounters:  05/26/22 94.9 kg  05/21/22 93.4 kg  01/25/22 103 kg    Labs: Lab Results  Component Value Date   NA 139 05/18/2022   K 4.1 05/18/2022   CL 104 05/18/2022   CO2 21 05/18/2022   GLUCOSE 101 (H) 05/18/2022   BUN 14 05/18/2022   CREATININE 0.90 05/18/2022   CALCIUM 9.7 05/18/2022   MG 2.2 12/22/2021   Lab Results  Component Value Date   INR 1.07 08/09/2017   Lab Results  Component Value Date   CHOL 160 01/24/2019   HDL 31 (L) 01/24/2019   LDLCALC 94 01/24/2019   TRIG 174 (H) 01/24/2019    GEN- The patient is a well appearing obese female, alert and oriented x 3 today.   HEENT-head normocephalic, atraumatic, sclera clear, conjunctiva pink, hearing intact, trachea midline. Lungs- Clear to ausculation bilaterally, normal work of breathing Heart- irregular rate and rhythm, tachycardia, no murmurs, rubs or gallops  GI- soft, NT, ND, + BS Extremities- no clubbing, cyanosis, or edema MS- no significant deformity or atrophy Skin- no rash or lesion Psych- euthymic mood, full affect Neuro- strength and sensation are intact   EKG-  Atypical atrial flutter with variable block Vent. rate 133 BPM PR interval * ms QRS duration 82 ms QT/QTcB 318/473 ms   Echo 06/11/20-  1. Left  ventricular ejection fraction, by estimation, is 60 to 65%. The left ventricle has normal function. The left ventricle has no regional  wall motion abnormalities. Left ventricular diastolic parameters were  normal.   2. Right ventricular systolic function is normal. The right ventricular  size is normal. There is normal pulmonary artery systolic pressure.   3. Left atrial size was moderately dilated.   4. Cannot r/o PFO.   5. The mitral valve is degenerative. Trivial mitral valve regurgitation.  No evidence of mitral stenosis.   6. The aortic valve is tricuspid. Aortic valve regurgitation is not  visualized. Mild aortic valve sclerosis is present, with no evidence of  aortic valve stenosis.   7. The inferior vena cava is normal in size with greater than 50%  respiratory variability, suggesting right atrial pressure of 3 mmHg.    Assessment and Plan:  1. Persistent Atrial fibrillation/atrial flutter  S/p ablation 2019 with repeat ablation 05/21/22 She is in rapid atrial flutter today. BP low, systolic 269 on recheck.  We discussed rhythm control options. Offered ED referral for urgent DCCV, patient declined at this time. Will arrange for next available outpatient DCCV. Strict ED precautions given. If she fails DCCV again, question if she will need alternate AAD.  Continue  Tikosyn 125 mcg BID. QT stable.  Continue diltiazem 240 mg AM and 120 mg PM Patient has only been taking Toprol 50 mg daily PRN. Will not resume scheduled dose at  this time with low BP.  Continue Eliquis 5 mg BID for CHA2DS2VASc score of 3.  2. Obesity Body mass index is 34.81 kg/m. Lifestyle modification was discussed and encouraged including regular physical activity and weight reduction.  3. OSA Encouraged compliance with CPAP (not using oral device anymore).   Follow up with Roderic Palau as scheduled post DCCV.    [facility] [address] [place], [place]  [address] [phone]

## 2022-05-26 NOTE — Patient Instructions (Addendum)
Cardioversion scheduled for:   - Arrive at the [facility] and go to admitting at   - Do not eat or drink anything after midnight the night prior to your procedure.   - Take all your morning medication (except diabetic medications) with a sip of water prior to arrival. - You will not be able to drive home after your procedure.    - Do NOT miss any doses of your blood thinner - if you should miss a dose please notify our office immediately.   - If you feel as if you go back into normal rhythm prior to scheduled cardioversion, please notify our office immediately.  If your procedure is canceled in the cardioversion suite you will be charged a cancellation fee.   If you are on weekly OZEMPIC, TRULICITY, MOUNJARO, WEGOVY, OR BYDUREON  Hold medication 7 days prior to scheduled procedure/anesthesia.  Restart medication on the normal dosing day after scheduled procedure/anesthesia  If you are on daily BYETTA, VICTOZA, ADLYXIN, OR RYBELSUS:   Hold medication 24 hours prior to scheduled procedure/anesthesia.   Restart medication on the following day after scheduled procedure/anesthesia   For those patients who have a scheduled procedure/anesthesia on the same day of the week as their dose, hold the medication on the day of surgery.  They can take their scheduled dose the week before.  **Patients on the above medications scheduled for elective procedures that have not held the medication for the appropriate amount of time are at risk of cancellation or change in the anesthetic plan.

## 2022-05-26 NOTE — Telephone Encounter (Signed)
Patient called stating she is scheduled for a cardioversion, she is a little uncomfortable that is it scheduled so close to her ablation.  She wants to know what her choices are, she would like to hear them from the MD rather than the PA-C.

## 2022-05-27 ENCOUNTER — Other Ambulatory Visit (HOSPITAL_COMMUNITY): Payer: Self-pay | Admitting: *Deleted

## 2022-05-27 ENCOUNTER — Encounter (HOSPITAL_COMMUNITY): Payer: Medicare HMO | Admitting: Physician Assistant

## 2022-05-27 DIAGNOSIS — I4819 Other persistent atrial fibrillation: Secondary | ICD-10-CM

## 2022-05-27 NOTE — Telephone Encounter (Signed)
Spent over 25 minutes on the phone with patient. Pt very confused as to what has occurred recently. She really would like to discuss further with the MD only. She also cannot understand reasoning for such a soon need for DCCV, but I did try and help explain this more for pt.   She did say this eased her mind talking with me. Someone also mentioned PPM to her and this has her concerned as well. Aware I am speaking with Dr. Curt Bears in the morning to arrange a telephone call tomorrow afternoon with him. Pt is agreeable to plan.

## 2022-05-28 ENCOUNTER — Encounter: Payer: Self-pay | Admitting: Cardiology

## 2022-05-28 ENCOUNTER — Ambulatory Visit: Payer: Medicare HMO | Attending: Cardiology | Admitting: Cardiology

## 2022-05-28 DIAGNOSIS — I4819 Other persistent atrial fibrillation: Secondary | ICD-10-CM

## 2022-05-28 NOTE — Progress Notes (Signed)
Phone call with patient today.  She remains in atrial fibrillation/flutter.  She is feeling improved with less palpitations.  She has plans for cardioversion on 06/04/2022.  All questions were answered.

## 2022-06-04 ENCOUNTER — Encounter (HOSPITAL_COMMUNITY): Payer: Self-pay | Admitting: Cardiology

## 2022-06-04 ENCOUNTER — Encounter (HOSPITAL_COMMUNITY): Payer: Self-pay | Admitting: Anesthesiology

## 2022-06-04 ENCOUNTER — Encounter (HOSPITAL_COMMUNITY)
Admission: RE | Disposition: A | Discharge status: Home / Self Care | Payer: Self-pay | Source: Home / Self Care | Attending: Cardiology

## 2022-06-04 ENCOUNTER — Ambulatory Visit (HOSPITAL_COMMUNITY)
Admission: RE | Admit: 2022-06-04 | Discharge: 2022-06-04 | Disposition: A | Discharge status: Home / Self Care | Payer: Medicare HMO | Attending: Cardiology | Admitting: Cardiology

## 2022-06-04 DIAGNOSIS — Z538 Procedure and treatment not carried out for other reasons: Secondary | ICD-10-CM | POA: Diagnosis not present

## 2022-06-04 DIAGNOSIS — I4891 Unspecified atrial fibrillation: Secondary | ICD-10-CM | POA: Insufficient documentation

## 2022-06-04 DIAGNOSIS — I4819 Other persistent atrial fibrillation: Secondary | ICD-10-CM

## 2022-06-04 SURGERY — CANCELLED PROCEDURE
Anesthesia: General

## 2022-06-04 MED ORDER — SODIUM CHLORIDE 0.9 % IV SOLN: INTRAVENOUS | Status: DC

## 2022-06-04 NOTE — Progress Notes (Signed)
Patient arrived to [facility] end[facility] department in sinus rhythm.  Dr. Johney Frame notified.  Case cancelled.

## 2022-06-04 NOTE — Anesthesia Preprocedure Evaluation (Deleted)
Anesthesia Evaluation    Reviewed: Allergy & Precautions, Patient's Chart, lab work & pertinent test results  History of Anesthesia Complications Negative for: history of anesthetic complications  Airway        Dental   Pulmonary asthma , sleep apnea           Cardiovascular hypertension, Pt. on home beta blockers and Pt. on medications + DVT  + dysrhythmias Atrial Fibrillation    '22 TTE - EF 60 to 65%. Left atrial size was moderately dilated. Cannot r/o PFO. Trivial MR.    Neuro/Psych  PSYCHIATRIC DISORDERS  Depression    negative neurological ROS     GI/Hepatic Neg liver ROS, hiatal hernia,GERD  ,,  Endo/Other  diabetes, Type 2, Oral Hypoglycemic Agents   On GLP1-a    Renal/GU negative Renal ROS     Musculoskeletal  (+) Arthritis ,  Fibromyalgia -  Abdominal   Peds  Hematology  On eliquis    Anesthesia Other Findings   Reproductive/Obstetrics                              Anesthesia Physical Anesthesia Plan  ASA: 3  Anesthesia Plan: General   Post-op Pain Management: Minimal or no pain anticipated   Induction: Intravenous  PONV Risk Score and Plan: 3 and Treatment may vary due to age or medical condition and Propofol infusion  Airway Management Planned: Natural Airway and Mask  Additional Equipment: None  Intra-op Plan:   Post-operative Plan:   Informed Consent:   Plan Discussed with: CRNA and Anesthesiologist  Anesthesia Plan Comments:          Anesthesia Quick Evaluation

## 2022-06-04 NOTE — Progress Notes (Signed)
Procedure cancelled as patient in NSR upon arrival to Endoscopy.  Gwyndolyn Kaufman, MD

## 2022-06-21 ENCOUNTER — Ambulatory Visit (HOSPITAL_COMMUNITY): Payer: No Typology Code available for payment source | Admitting: Nurse Practitioner

## 2022-06-24 ENCOUNTER — Ambulatory Visit (INDEPENDENT_AMBULATORY_CARE_PROVIDER_SITE_OTHER): Payer: Medicare HMO | Admitting: Podiatry

## 2022-06-24 VITALS — BP 111/63 | HR 76

## 2022-06-24 DIAGNOSIS — L84 Corns and callosities: Secondary | ICD-10-CM

## 2022-06-24 DIAGNOSIS — B351 Tinea unguium: Secondary | ICD-10-CM | POA: Diagnosis not present

## 2022-06-24 DIAGNOSIS — G629 Polyneuropathy, unspecified: Secondary | ICD-10-CM

## 2022-06-24 NOTE — Progress Notes (Signed)
  Subjective:  Patient ID: Christie Atkinson, female    DOB: [birth year],  MRN: [id number]  Christie Atkinson presents to clinic today for at risk foot care with history of peripheral neuropathy and preulcerative lesion(s) b/l lower extremities and painful mycotic toenails that limit ambulation. Painful toenails interfere with ambulation. Aggravating factors include wearing enclosed shoe gear. Pain is relieved with periodic professional debridement. Painful porokeratotic lesions are aggravated when weightbearing with and without shoegear. Pain is relieved with periodic professional debridement.  Chief Complaint  Patient presents with   Nail Problem    ROUTINE FOOT CARE, NAIL TRIM, PCP LAST SEEN 2 WEEKS AGO, ARTHRITIS    New problem(s): None.   PCP is [facility]  Allergies  Allergen Reactions   Nickel Hives and Itching   Elemental Sulfur Hives   Hydrocodone-Acetaminophen Nausea And Vomiting   Other Other (See Comments)    Polyester - including hospital gowns and sheets - allergic contact dermatitis   Oxycodone-Acetaminophen     Unknown reaction     Review of Systems: Negative except as noted in the HPI.  Objective: No changes noted in today's physical examination. Vitals:   06/24/22 1026  BP: 111/63  Pulse: 76   Etheline A Druckenmiller is a pleasant 67 y.o. female in NAD. AAO x 3.  Vascular Examination: Capillary refill time immediate b/l.Vascular status intact b/l with palpable pedal pulses. Pedal hair present b/l. No edema. No pain with calf compression b/l. Skin temperature gradient WNL b/l.   Neurological Examination: Protective sensation decreased with 10 gram monofilament b/l. Vibratory sensation diminished b/l.  Dermatological Examination: Pedal skin with normal turgor, texture and tone b/l. Toenails 1-5 b/l thick, discolored, elongated with subungual debris and pain on dorsal palpation.   Preulcerative lesion noted transverse lesion left forefoot and plantar IPJ  of right great toe. There is visible subdermal hemorrhage. There is no surrounding erythema, no edema, no drainage, no odor, no fluctuance.  Musculoskeletal Examination: Muscle strength 5/5 to all lower extremity muscle groups bilaterally. No gross bony deformities bilaterally. Limited joint ROM to the 1st MPJ right foot.  Radiographs: None  Assessment/Plan: 1. Onychomycosis   2. Pre-ulcerative calluses   3. Peripheral polyneuropathy     No orders of the defined types were placed in this encounter.  -Consent given for treatment as described below: -Examined patient. -Continue supportive shoe gear daily. -Mycotic toenails 1-5 bilaterally were debrided in length and girth with sterile nail nippers and dremel without incident. -Preulcerative lesion pared left forefoot and right great toe utilizing sterile scalpel blade. Total number pared=2. -Patient/POA to call should there be question/concern in the interim.   Return in about 12 weeks (around 09/16/2022).  Marzetta Board, DPM

## 2022-07-08 ENCOUNTER — Ambulatory Visit (HOSPITAL_COMMUNITY): Payer: Self-pay | Admitting: Nurse Practitioner

## 2022-07-13 ENCOUNTER — Encounter (HOSPITAL_COMMUNITY): Payer: Self-pay | Admitting: Nurse Practitioner

## 2022-07-13 ENCOUNTER — Inpatient Hospital Stay (HOSPITAL_COMMUNITY)
Admission: RE | Admit: 2022-07-13 | Discharge: 2022-07-13 | Disposition: A | Discharge status: Home / Self Care | Payer: Medicare HMO | Source: Ambulatory Visit | Attending: Nurse Practitioner | Admitting: Nurse Practitioner

## 2022-07-13 ENCOUNTER — Other Ambulatory Visit (HOSPITAL_COMMUNITY): Payer: Self-pay

## 2022-07-13 ENCOUNTER — Ambulatory Visit (HOSPITAL_COMMUNITY)
Admission: RE | Admit: 2022-07-13 | Discharge: 2022-07-13 | Disposition: A | Discharge status: Home / Self Care | Payer: Medicare HMO | Source: Ambulatory Visit | Attending: Nurse Practitioner | Admitting: Nurse Practitioner

## 2022-07-13 VITALS — BP 112/76 | HR 92 | Ht 65.0 in | Wt 207.8 lb

## 2022-07-13 DIAGNOSIS — I4892 Unspecified atrial flutter: Secondary | ICD-10-CM | POA: Diagnosis not present

## 2022-07-13 DIAGNOSIS — Z7901 Long term (current) use of anticoagulants: Secondary | ICD-10-CM | POA: Diagnosis not present

## 2022-07-13 DIAGNOSIS — Z79899 Other long term (current) drug therapy: Secondary | ICD-10-CM | POA: Diagnosis not present

## 2022-07-13 DIAGNOSIS — Z6834 Body mass index (BMI) 34.0-34.9, adult: Secondary | ICD-10-CM | POA: Insufficient documentation

## 2022-07-13 DIAGNOSIS — G4733 Obstructive sleep apnea (adult) (pediatric): Secondary | ICD-10-CM | POA: Diagnosis not present

## 2022-07-13 DIAGNOSIS — I4819 Other persistent atrial fibrillation: Secondary | ICD-10-CM | POA: Insufficient documentation

## 2022-07-13 DIAGNOSIS — I4891 Unspecified atrial fibrillation: Secondary | ICD-10-CM

## 2022-07-13 DIAGNOSIS — E669 Obesity, unspecified: Secondary | ICD-10-CM | POA: Insufficient documentation

## 2022-07-13 DIAGNOSIS — Z96652 Presence of left artificial knee joint: Secondary | ICD-10-CM | POA: Diagnosis not present

## 2022-07-13 DIAGNOSIS — I484 Atypical atrial flutter: Secondary | ICD-10-CM | POA: Diagnosis not present

## 2022-07-13 LAB — MAGNESIUM: Magnesium: 2.3 mg/dL (ref 1.7–2.4)

## 2022-07-13 LAB — BASIC METABOLIC PANEL
Anion gap: 8 (ref 5–15)
BUN: 12 mg/dL (ref 8–23)
CO2: 25 mmol/L (ref 22–32)
Calcium: 9 mg/dL (ref 8.9–10.3)
Chloride: 103 mmol/L (ref 98–111)
Creatinine, Ser: 0.85 mg/dL (ref 0.44–1.00)
GFR, Estimated: >60 mL/min (ref >60)
Glucose, Bld: 106 mg/dL — ABNORMAL HIGH (ref 70–99)
Potassium: 3.9 mmol/L (ref 3.5–5.1)
Sodium: 136 mmol/L (ref 135–145)

## 2022-07-13 MED ORDER — POTASSIUM CHLORIDE ER 10 MEQ PO CPCR: 10.0000 meq | ORAL_CAPSULE | ORAL | 2 refills | Status: AC

## 2022-07-13 MED ORDER — MAGNESIUM OXIDE 400 MG PO CAPS: 1.0000 | ORAL_CAPSULE | Freq: Every morning | ORAL | 2 refills | Status: DC

## 2022-07-13 MED ORDER — DOFETILIDE 125 MCG PO CAPS: 125.0000 ug | ORAL_CAPSULE | Freq: Two times a day (BID) | ORAL | 2 refills | Status: DC

## 2022-07-13 MED ORDER — APIXABAN 5 MG PO TABS: 5.0000 mg | ORAL_TABLET | Freq: Two times a day (BID) | ORAL | 2 refills | Status: DC

## 2022-07-13 MED ORDER — METOPROLOL SUCCINATE ER 50 MG PO TB24: ORAL_TABLET | ORAL | Status: DC

## 2022-07-13 MED ORDER — DILTIAZEM HCL ER COATED BEADS 120 MG PO CP24: ORAL_CAPSULE | ORAL | 2 refills | Status: DC

## 2022-07-13 NOTE — Progress Notes (Addendum)
Primary Care Physician: [facility], [facility] Referring Physician: previously [name] Primary EP: Dr Georgiana Spinner is a 67 y.o. female with a h/o persistent afib that was having  breakthrough afib with Tikosyn and had an afib ablation 03/09/18 with [name]. She also had foot surgery last summer and had more paroxysmal afib during that time.  The afib  calmed down now that her foot surgery stress has resolved. She remains  on eliquis 5 mg bid with a CHA2DS2VASc score of 3( HF, female, pre -diabetes)  F/u in afib clinic 5/12, She spent most of the winter in [place] with her husband visiting their daughter. Her husband has developed melanoma and his health is poor at this time. She  has been very  worried about him. She  had a lot of afib while in [place] and the elevation increased her issues with dyspnea. She saw a cardiologist while there and her lasix was increased as well as metoprolol added. She remains on dofetilide 125 mcg bid. An echo was done and she was told something about the pump of the heart. She had an EF of 40-45% before SR was restored with dofetilide, it did normalize with SR. She remains on eliquis 5 mg bid . She had a knee injection today and may have to have knee replacement in the future.   F/u in afib clinic, 12/02/20. She has had rt knee surgery 5/17. Just 2 short afib issues, self converted. Continues to be complaint with Tikosyn and eliquis.   F/u in afib clinic 06/23/21. She has not noted any afib. Pending colonoscopy early February and will be able to stop xarelto 2 days prior to procedure. Continues on dofetilide.   F/u in afib clinic, 12/22/21. She is in atrial flutter at 112 bpm today. She is minimally symptomatic. She states that she saw Dr. Ola Spurr,  EP,  with [facility] in [place] in February and was out of rhythm then.  He discussed a second ablation. She saw his NP 5/31 and a monitor earlier placed showed a afib burden of 26%. She remains on  tikosyn. She states she has been off her daily metoprolol for some time. She discussed staying with Dr. Ola Spurr or staying with [facility] EP's in [place]. Jackalyn [place] departure. She liked the ease of seeing EP in the [place] area. She is minimally symptomatic with afib but her rate will be slower or stay in SR better if she goes back to 50 mg daily of metoprolol.   Follow up in the AF clinic 05/26/22. Patient is s/p afib ablation 05/21/22 with Dr Curt Bears. Unfortunately, she went into afib during the ablation and did not convert. She is in rapid atrial flutter today feeling tired and a little lightheaded. She denies chest pain or SOB. She has some groin tenderness on the left side but this predated her ablation.   F/u in afib clinic 07/13/22. She was scheduled for cardioversion as she was out of rhythm since ablation 12/15. But she did return to SR and  the cardioversion scheduled for 06/04/22 was cancelled. EKG today shows atrial flutter, rate controlled. She states that she feels well. Continues on dofetilide and anticoagulation.   Today, she denies symptoms of palpitations, chest pain, shortness of breath, orthopnea, PND, lower extremity edema, presyncope, syncope, or neurologic sequela. The patient is tolerating medications without difficulties and is otherwise without complaint today.   Past Medical History:  Diagnosis Date   Asthma    Atrial fibrillation (  [place])    Back pain    Diabetes mellitus without complication (HCC)    Diastolic dysfunction    Dysrhythmia    a fib   Fibromyalgia    neuropathy feet   GERD (gastroesophageal reflux disease)    History of hiatal hernia    History of kidney stones    Hypertension    Knee pain    Osteoarthritis    Persistent atrial fibrillation (HCC)    Plantar fasciitis    Sleep apnea    wears oral appliance   Visit for monitoring Tikosyn therapy 02/07/2018   Past Surgical History:  Procedure Laterality Date   ABDOMINAL HYSTERECTOMY  2003    APPENDECTOMY     ATRIAL FIBRILLATION ABLATION N/A 03/09/2018   Procedure: ATRIAL FIBRILLATION ABLATION;  Surgeon: [name], MD;  Location: [facility];  Service: Cardiovascular;  Laterality: N/A;   ATRIAL FIBRILLATION ABLATION N/A 05/21/2022   Procedure: ATRIAL FIBRILLATION ABLATION;  Surgeon: Constance Haw, MD;  Location: [facility];  Service: Cardiovascular;  Laterality: N/A;   CARDIOVERSION N/A 07/04/2017   Procedure: CARDIOVERSION;  Surgeon: [name], MD;  Location: [facility] ENDOSCOPY;  Service: Cardiovascular;  Laterality: N/A;   CARDIOVERSION N/A 09/06/2017   Procedure: CARDIOVERSION;  Surgeon: [name], MD;  Location: [facility] ENDOSCOPY;  Service: Cardiovascular;  Laterality: N/A;   CHOLECYSTECTOMY N/A 02/24/2015   Procedure: LAPAROSCOPIC CHOLECYSTECTOMY;  Surgeon: [name], MD;  Location: [facility];  Service: General;  Laterality: N/A;   EXCISION HAGLUND'S DEFORMITY WITH ACHILLES TENDON REPAIR Left 12/21/2018   Procedure: Left Achilles Tendon Debridement and Reconstruction; Excision of Haglund Deformity;  Surgeon: [name], MD;  Location: [facility];  Service: Orthopedics;  Laterality: Left;   GASTROCNEMIUS RECESSION Left 12/21/2018   Procedure: Left Gastroc Recession;  Surgeon: [name], MD;  Location: [facility];  Service: Orthopedics;  Laterality: Left;   RIGHT/LEFT HEART CATH AND CORONARY ANGIOGRAPHY N/A 08/09/2017   Procedure: RIGHT/LEFT HEART CATH AND CORONARY ANGIOGRAPHY;  Surgeon: [name], MD;  Location: [facility];  Service: Cardiovascular;  Laterality: N/A;   TEE WITHOUT CARDIOVERSION N/A 07/04/2017   Procedure: TRANSESOPHAGEAL ECHOCARDIOGRAM (TEE);  Surgeon: [name], MD;  Location: [facility] ENDOSCOPY;  Service: Cardiovascular;  Laterality: N/A;   TEE WITHOUT CARDIOVERSION N/A 03/09/2018   Procedure: TRANSESOPHAGEAL ECHOCARDIOGRAM (TEE);  Surgeon: [name], MD;  Location: [facility] ENDOSCOPY;   Service: Cardiovascular;  Laterality: N/A;   TOTAL KNEE ARTHROPLASTY Right 10/20/2020   Procedure: TOTAL KNEE ARTHROPLASTY;  Surgeon: Vickey Huger, MD;  Location: [facility];  Service: Orthopedics;  Laterality: Right;   TOTAL KNEE ARTHROPLASTY Left 02/02/2021   Procedure: TOTAL KNEE ARTHROPLASTY;  Surgeon: Vickey Huger, MD;  Location: [facility];  Service: Orthopedics;  Laterality: Left;   VAGINAL DELIVERY     x4    Current Outpatient Medications  Medication Sig Dispense Refill   acetaminophen (TYLENOL) 500 MG tablet Take 500 mg by mouth 3 (three) times daily as needed for moderate pain.     acyclovir (ZOVIRAX) 200 MG capsule Take 200-800 mg by mouth 3 (three) times daily as needed (fever blisters).     albuterol (VENTOLIN HFA) 108 (90 Base) MCG/ACT inhaler Inhale 2-3 puffs into the lungs every 4 (four) hours as needed for wheezing or shortness of breath.     apixaban (ELIQUIS) 5 MG TABS tablet Take 1 tablet (5 mg total) by mouth 2 (two) times daily. 180 tablet 3   diltiazem (  CARDIZEM CD) 120 MG 24 hr capsule Take 2 tablets in the AM and 1 tablet in the PM 270 capsule 2   dofetilide (TIKOSYN) 125 MCG capsule Take 1 capsule (125 mcg total) by mouth 2 (two) times daily. 180 capsule 1   erythromycin ophthalmic ointment Place 1 Application into both eyes every 4 (four) hours as needed (eye infections).     fluticasone (FLONASE) 50 MCG/ACT nasal spray Place 1 spray into both nostrils 2 (two) times daily.     fluticasone-salmeterol (ADVAIR) 250-50 MCG/ACT AEPB Inhale 1 puff into the lungs 2 (two) times daily.     furosemide (LASIX) 20 MG tablet Take 1 tablet (20 mg total) by mouth 2 (two) times daily. (Patient taking differently: Take 20-40 mg by mouth See admin instructions. Take 40 mg daily, may take another 20 mg dose as needed for swelling)     lidocaine (LIDODERM) 5 % Place 1-3 patches onto the skin daily. Remove & Discard patch within 12 hours or as directed by MD     LIDOCAINE-MENTHOL ROLL-ON EX Apply  1 Application topically as needed (pain).     metFORMIN (GLUCOPHAGE-XR) 500 MG 24 hr tablet Take 500 mg by mouth daily.     metoprolol succinate (TOPROL-XL) 50 MG 24 hr tablet Take 1 tablet (50 mg total) by mouth daily. Take with or immediately following a meal.     potassium chloride (MICRO-K) 10 MEQ CR capsule Take 1 capsule (10 mEq total) by mouth 2 (two) times daily. (Patient taking differently: Take 10-20 mEq by mouth See admin instructions. Take 20 meq, may take another 10 meq dose as needed for swelling (when taking 3rd tablet of lasix)) 180 capsule 2   Semaglutide-Weight Management (WEGOVY) 2.4 MG/0.75ML SOAJ Inject 2.4 mg into the skin every Sunday.     No current facility-administered medications for this encounter.    Allergies  Allergen Reactions   Nickel Hives and Itching   Elemental Sulfur Hives   Hydrocodone-Acetaminophen Nausea And Vomiting   Other Other (See Comments)    Polyester - including hospital gowns and sheets - allergic contact dermatitis   Oxycodone-Acetaminophen     Unknown reaction     Social History   Socioeconomic History   Marital status: Significant Other    Spouse name: Not on file   Number of children: 4   Years of education: Not on file   Highest education level: Not on file  Occupational History   Occupation: Disabled/retired  Tobacco Use   Smoking status: Never    Passive exposure: Yes   Smokeless tobacco: Never  Vaping Use   Vaping Use: Never used  Substance and Sexual Activity   Alcohol use: Yes    Alcohol/week: 2.0 standard drinks of alcohol    Types: 2 Standard drinks or equivalent per week    Comment: little   Drug use: No   Sexual activity: Not Currently  Other Topics Concern   Not on file  Social History Narrative   Lives in [place]   Not working   Social Determinants of Health   Financial Resource Strain: Not on file  Food Insecurity: Not on file  Transportation Needs: Not on file  Physical Activity: Not on file   Stress: Not on file  Social Connections: Not on file  Intimate Partner Violence: Not on file    Family History  Problem Relation Age of Onset   Heart disease Mother    Hypertension Mother    Hyperlipidemia Mother  Diabetes Mother    Sudden death Mother    Thyroid disease Mother    Obesity Mother    Heart disease Father    Heart attack Father    Hypertension Father    Hyperlipidemia Father    Diabetes Father    Allergic rhinitis Son    Eczema Son    Allergic rhinitis Daughter    Eczema Daughter    Angioedema Neg Hx    Asthma Neg Hx    Immunodeficiency Neg Hx     ROS- All systems are reviewed and negative except as per the HPI above  Physical Exam: Vitals:   07/13/22 0933  Weight: 94.3 kg  Height: 5' 5"$  (1.651 m)   Wt Readings from Last 3 Encounters:  07/13/22 94.3 kg  06/04/22 94.3 kg  05/26/22 94.9 kg    Labs: Lab Results  Component Value Date   NA 139 05/18/2022   K 4.1 05/18/2022   CL 104 05/18/2022   CO2 21 05/18/2022   GLUCOSE 101 (H) 05/18/2022   BUN 14 05/18/2022   CREATININE 0.90 05/18/2022   CALCIUM 9.7 05/18/2022   MG 2.2 12/22/2021   Lab Results  Component Value Date   INR 1.07 08/09/2017   Lab Results  Component Value Date   CHOL 160 01/24/2019   HDL 31 (L) 01/24/2019   LDLCALC 94 01/24/2019   TRIG 174 (H) 01/24/2019    GEN- The patient is a well appearing obese female, alert and oriented x 3 today.   HEENT-head normocephalic, atraumatic, sclera clear, conjunctiva pink, hearing intact, trachea midline. Lungs- Clear to ausculation bilaterally, normal work of breathing Heart- irregular rate and rhythm, tachycardia, no murmurs, rubs or gallops  GI- soft, NT, ND, + BS Extremities- no clubbing, cyanosis, or edema MS- no significant deformity or atrophy Skin- no rash or lesion Psych- euthymic mood, full affect Neuro- strength and sensation are intact   EKG- Vent. rate 92 BPM PR interval * ms QRS duration 80 ms QT/QTcB 394/487  ms P-R-T axes 86 80 67 Atrial flutter with variable A-V block Abnormal ECG When compared with ECG of 04-Jun-2022 07:10, PREVIOUS ECG IS PRESENT     Echo 06/11/20-  1. Left ventricular ejection fraction, by estimation, is 60 to 65%. The left ventricle has normal function. The left ventricle has no regional  wall motion abnormalities. Left ventricular diastolic parameters were  normal.   2. Right ventricular systolic function is normal. The right ventricular  size is normal. There is normal pulmonary artery systolic pressure.   3. Left atrial size was moderately dilated.   4. Cannot r/o PFO.   5. The mitral valve is degenerative. Trivial mitral valve regurgitation.  No evidence of mitral stenosis.   6. The aortic valve is tricuspid. Aortic valve regurgitation is not  visualized. Mild aortic valve sclerosis is present, with no evidence of  aortic valve stenosis.   7. The inferior vena cava is normal in size with greater than 50%  respiratory variability, suggesting right atrial pressure of 3 mmHg.    Assessment and Plan:  1. Persistent Atrial fibrillation/atrial flutter  S/p ablation 2019 with repeat ablation 05/21/22 She was in atrial flutter after ablation but in rhythm for scheduled DCCV 12/29. She now presents in rate controlled atrial flutter  and is asymptomatic.  Will place a one  week zio patch and then discuss plan  of treatment with Dr. Curt Bears when  results reviewed  Continue  Tikosyn 125 mcg BID. QT stable.  Continue  diltiazem 240 mg AM and 120 mg PM Patient has only been taking Toprol 50 mg daily PRN. Will not resume scheduled dose at this time with soft  BP.  Continue Eliquis 5 mg BID for CHA2DS2VASc score of 3.  2. Obesity Body mass index is 34.58 kg/m. Lifestyle modification was discussed and encouraged including regular physical activity and weight reduction.  3. OSA Encouraged compliance with CPAP (not using oral device anymore).    Addendum- 07/29/22- Zio  patch returned 100 % atrial flutter. I  discussed approach with Dr. Shonna Chock. Pt states that she feels well and would rather just wait and discuss with him ablation f/u. I offered a cardioversion but she deferred. Dr. Curt Bears  mentioned that he may take her back to ablation to see if he csn  ablate the flutter. She has used flecainide in the past but had ERAF after cardioversion when loaded  on it and it was stopped. That is when she was loaded on tikosyn. I asked her to check her BP/HR;s at home for the next 1 week and then send thru My Chart to see if her rate  meds need to be adjusted.   Geroge Baseman Davit Vassar, [facility] [address] [place], [place] [address] [phone]

## 2022-07-13 NOTE — Addendum Note (Signed)
Encounter addended by: Enid Derry, CMA on: 07/13/2022 5:06 PM  Actions taken: Order list changed

## 2022-07-15 ENCOUNTER — Other Ambulatory Visit (HOSPITAL_COMMUNITY): Payer: Self-pay

## 2022-07-15 ENCOUNTER — Encounter (HOSPITAL_COMMUNITY): Payer: Self-pay | Admitting: *Deleted

## 2022-07-15 DIAGNOSIS — I4891 Unspecified atrial fibrillation: Secondary | ICD-10-CM

## 2022-07-26 ENCOUNTER — Other Ambulatory Visit (HOSPITAL_COMMUNITY): Payer: Self-pay | Admitting: Nurse Practitioner

## 2022-07-27 LAB — BASIC METABOLIC PANEL
BUN/Creatinine Ratio: 15 (ref 12–28)
BUN: 17 mg/dL (ref 8–27)
CO2: 24 mmol/L (ref 20–29)
Calcium: 9.8 mg/dL (ref 8.7–10.3)
Chloride: 102 mmol/L (ref 96–106)
Creatinine, Ser: 1.1 mg/dL — ABNORMAL HIGH (ref 0.57–1.00)
Glucose: 90 mg/dL (ref 70–99)
Potassium: 4.5 mmol/L (ref 3.5–5.2)
Sodium: 141 mmol/L (ref 134–144)
eGFR: 55 mL/min/{1.73_m2} — ABNORMAL LOW (ref >59)

## 2022-07-29 NOTE — Addendum Note (Signed)
Encounter addended by: Sherran Needs, NP on: 07/29/2022 11:28 AM  Actions taken: Clinical Note Signed

## 2022-08-23 ENCOUNTER — Encounter: Payer: Self-pay | Admitting: Cardiology

## 2022-08-23 ENCOUNTER — Ambulatory Visit: Payer: No Typology Code available for payment source | Attending: Cardiology | Admitting: Cardiology

## 2022-08-23 VITALS — BP 118/70 | HR 72 | Ht 65.0 in | Wt 213.6 lb

## 2022-08-23 DIAGNOSIS — D6869 Other thrombophilia: Secondary | ICD-10-CM

## 2022-08-23 DIAGNOSIS — Z79899 Other long term (current) drug therapy: Secondary | ICD-10-CM

## 2022-08-23 DIAGNOSIS — I4819 Other persistent atrial fibrillation: Secondary | ICD-10-CM | POA: Diagnosis not present

## 2022-08-23 DIAGNOSIS — G4733 Obstructive sleep apnea (adult) (pediatric): Secondary | ICD-10-CM | POA: Diagnosis not present

## 2022-08-23 NOTE — Progress Notes (Signed)
Electrophysiology Office Note   Date:  08/23/2022   ID:  Christie Atkinson, Christie Atkinson 1955-12-01, MRN [id number]  PCP:  [facility]  Cardiologist:   Primary Electrophysiologist:  Kisa Fujii Meredith Leeds, MD    Chief Complaint: AF/flutter   History of Present Illness: Christie Atkinson is a 67 y.o. female who is being seen today for the evaluation of AF/flutter at the request of [facility]. Presenting today for electrophysiology evaluation.  She has a history significant for persistent atrial fibrillation, hypertension, diabetes, sleep apnea.  She is currently on dofetilide.  She was having breakthrough episodes of atrial fibrillation is post ablation 03/09/2018.  She has also had atrial flutter.  She again presented for ablation 05/21/2022.  She went into atrial fibrillation during ablation and despite isolated pulmonary veins and posterior wall, she would not convert to sinus rhythm.  Today, denies symptoms of palpitations, chest pain, shortness of breath, orthopnea, PND, lower extremity edema, claudication, dizziness, presyncope, syncope, bleeding, or neurologic sequela. The patient is tolerating medications without difficulties.  She feels well today.  After her ablation, she had multiple episodes of atrial fibrillation and atrial flutter.  She wore a cardiac monitor that showed a 100% atrial flutter burden.  She has apparently since converted to normal rhythm.  She feels mildly fatigued but overall has no major complaints.   Past Medical History:  Diagnosis Date   Asthma    Atrial fibrillation ([place])    Back pain    Diabetes mellitus without complication (HCC)    Diastolic dysfunction    Dysrhythmia    a fib   Fibromyalgia    neuropathy feet   GERD (gastroesophageal reflux disease)    History of hiatal hernia    History of kidney stones    Hypertension    Knee pain    Osteoarthritis    Persistent atrial fibrillation (HCC)    Plantar fasciitis    Sleep apnea     wears oral appliance   Visit for monitoring Tikosyn therapy 02/07/2018   Past Surgical History:  Procedure Laterality Date   ABDOMINAL HYSTERECTOMY  2003   APPENDECTOMY     ATRIAL FIBRILLATION ABLATION N/A 03/09/2018   Procedure: ATRIAL FIBRILLATION ABLATION;  Surgeon: [name], MD;  Location: [place] [facility];  Service: Cardiovascular;  Laterality: N/A;   ATRIAL FIBRILLATION ABLATION N/A 05/21/2022   Procedure: ATRIAL FIBRILLATION ABLATION;  Surgeon: Constance Haw, MD;  Location: [place] [facility];  Service: Cardiovascular;  Laterality: N/A;   CARDIOVERSION N/A 07/04/2017   Procedure: CARDIOVERSION;  Surgeon: [name], MD;  Location: [facility] ENDOSCOPY;  Service: Cardiovascular;  Laterality: N/A;   CARDIOVERSION N/A 09/06/2017   Procedure: CARDIOVERSION;  Surgeon: [name], MD;  Location: [facility] ENDOSCOPY;  Service: Cardiovascular;  Laterality: N/A;   CHOLECYSTECTOMY N/A 02/24/2015   Procedure: LAPAROSCOPIC CHOLECYSTECTOMY;  Surgeon: [name], MD;  Location: [place];  Service: General;  Laterality: N/A;   EXCISION HAGLUND'S DEFORMITY WITH ACHILLES TENDON REPAIR Left 12/21/2018   Procedure: Left Achilles Tendon Debridement and Reconstruction; Excision of Haglund Deformity;  Surgeon: [name], MD;  Location: [place];  Service: Orthopedics;  Laterality: Left;   GASTROCNEMIUS RECESSION Left 12/21/2018   Procedure: Left Gastroc Recession;  Surgeon: [name], MD;  Location: [place];  Service: Orthopedics;  Laterality: Left;   RIGHT/LEFT HEART CATH AND CORONARY ANGIOGRAPHY N/A 08/09/2017   Procedure: RIGHT/LEFT HEART CATH AND CORONARY ANGIOGRAPHY;  Surgeon: [name], MD;  Location: [place] [facility];  Service: Cardiovascular;  Laterality: N/A;   TEE WITHOUT CARDIOVERSION N/A 07/04/2017   Procedure: TRANSESOPHAGEAL ECHOCARDIOGRAM (TEE);  Surgeon: [name], MD;  Location: [facility] ENDOSCOPY;  Service: Cardiovascular;   Laterality: N/A;   TEE WITHOUT CARDIOVERSION N/A 03/09/2018   Procedure: TRANSESOPHAGEAL ECHOCARDIOGRAM (TEE);  Surgeon: [name], MD;  Location: [facility] ENDOSCOPY;  Service: Cardiovascular;  Laterality: N/A;   TOTAL KNEE ARTHROPLASTY Right 10/20/2020   Procedure: TOTAL KNEE ARTHROPLASTY;  Surgeon: Vickey Huger, MD;  Location: [facility];  Service: Orthopedics;  Laterality: Right;   TOTAL KNEE ARTHROPLASTY Left 02/02/2021   Procedure: TOTAL KNEE ARTHROPLASTY;  Surgeon: Vickey Huger, MD;  Location: [facility];  Service: Orthopedics;  Laterality: Left;   VAGINAL DELIVERY     x4     Current Outpatient Medications  Medication Sig Dispense Refill   acetaminophen (TYLENOL) 500 MG tablet Take 500 mg by mouth 3 (three) times daily as needed for moderate pain.     acyclovir (ZOVIRAX) 200 MG capsule Take 200-800 mg by mouth 3 (three) times daily as needed (fever blisters).     albuterol (VENTOLIN HFA) 108 (90 Base) MCG/ACT inhaler Inhale 2-3 puffs into the lungs every 4 (four) hours as needed for wheezing or shortness of breath.     apixaban (ELIQUIS) 5 MG TABS tablet Take 1 tablet (5 mg total) by mouth 2 (two) times daily. 180 tablet 2   diltiazem (CARDIZEM CD) 120 MG 24 hr capsule Take 2 tablets in the AM and 1 tablet in the PM 270 capsule 2   dofetilide (TIKOSYN) 125 MCG capsule Take 1 capsule (125 mcg total) by mouth 2 (two) times daily. 180 capsule 2   erythromycin ophthalmic ointment Place 1 Application into both eyes every 4 (four) hours as needed (eye infections).     fluticasone-salmeterol (ADVAIR) 250-50 MCG/ACT AEPB Inhale 1 puff into the lungs 2 (two) times daily.     furosemide (LASIX) 20 MG tablet Take 1 tablet (20 mg total) by mouth 2 (two) times daily. (Patient taking differently: Take 20-40 mg by mouth See admin instructions.)     gabapentin (NEURONTIN) 300 MG capsule Take 300 mg by mouth every 8 (eight) hours.     lidocaine (LIDODERM) 5 % Place 1-3 patches onto the skin daily. Remove &  Discard patch within 12 hours or as directed by MD     LIDOCAINE-MENTHOL ROLL-ON EX Apply 1 Application topically as needed (pain).     MAGNESIUM GLYCINATE PO Take 1 tablet by mouth every morning. Not sure the dosage- Tries to remember to take every day     Magnesium Oxide 400 MG CAPS Take 1 capsule (400 mg total) by mouth every morning. 90 capsule 2   metFORMIN (GLUCOPHAGE-XR) 500 MG 24 hr tablet Take 500 mg by mouth daily.     metoprolol succinate (TOPROL-XL) 50 MG 24 hr tablet Takes 1 tablet by mouth as needed with a meal     OVER THE COUNTER MEDICATION as directed. CBD trial     pantoprazole (PROTONIX) 40 MG tablet Take 40 mg by mouth daily.     potassium chloride (MICRO-K) 10 MEQ CR capsule Take 1-2 capsules (10-20 mEq total) by mouth See admin instructions. Take 20 meq, may take another 10 meq dose as needed for swelling (when taking 3rd tablet of lasix) 180 capsule 2   Semaglutide-Weight Management (WEGOVY) 2.4 MG/0.75ML SOAJ Inject 2.4 mg into the skin every Sunday.     fluticasone (FLONASE) 50  MCG/ACT nasal spray Place 1 spray into both nostrils 2 (two) times daily.     No current facility-administered medications for this visit.    Allergies:   Nickel, Elemental sulfur, Hydrocodone-acetaminophen, Other, and Oxycodone-acetaminophen   Social History:  The patient  reports that she has never smoked. She has been exposed to tobacco smoke. She has never used smokeless tobacco. She reports current alcohol use of about 2.0 standard drinks of alcohol per week. She reports that she does not use drugs.   Family History:  The patient's family history includes Allergic rhinitis in her daughter and son; Diabetes in her father and mother; Eczema in her daughter and son; Heart attack in her father; Heart disease in her father and mother; Hyperlipidemia in her father and mother; Hypertension in her father and mother; Obesity in her mother; Sudden death in her mother; Thyroid disease in her mother.    ROS:  Please see the history of present illness.   Otherwise, review of systems is positive for none.   All other systems are reviewed and negative.   PHYSICAL EXAM: VS:  BP 118/70   Pulse 72   Ht 5\' 5"  (1.651 m)   Wt 213 lb 9.6 oz (96.9 kg)   SpO2 96%   BMI 35.54 kg/m  , BMI Body mass index is 35.54 kg/m. GEN: Well nourished, well developed, in no acute distress  HEENT: normal  Neck: no JVD, carotid bruits, or masses Cardiac: RRR; no murmurs, rubs, or gallops,no edema  Respiratory:  clear to auscultation bilaterally, normal work of breathing GI: soft, nontender, nondistended, + BS MS: no deformity or atrophy  Skin: warm and dry Neuro:  Strength and sensation are intact Psych: euthymic mood, full affect  EKG:  EKG is ordered today. Personal review of the ekg ordered shows sinus rhythm   Recent Labs: 05/18/2022: Hemoglobin 14.4; Platelets 272 07/13/2022: Magnesium 2.3 07/26/2022: BUN 17; Creatinine, Ser 1.10; Potassium 4.5; Sodium 141    Lipid Panel     Component Value Date/Time   CHOL 160 01/24/2019 1313   TRIG 174 (H) 01/24/2019 1313   HDL 31 (L) 01/24/2019 1313   CHOLHDL 5.4 06/26/2017 0517   VLDL 15 06/26/2017 0517   LDLCALC 94 01/24/2019 1313     Wt Readings from Last 3 Encounters:  08/23/22 213 lb 9.6 oz (96.9 kg)  07/13/22 207 lb 12.8 oz (94.3 kg)  06/04/22 208 lb (94.3 kg)      Other studies Reviewed: Additional studies/ records that were reviewed today include: TTE 06/11/20  Review of the above records today demonstrates:   1. Left ventricular ejection fraction, by estimation, is 60 to 65%. The  left ventricle has normal function. The left ventricle has no regional  wall motion abnormalities. Left ventricular diastolic parameters were  normal.   2. Right ventricular systolic function is normal. The right ventricular  size is normal. There is normal pulmonary artery systolic pressure.   3. Left atrial size was moderately dilated.   4. Cannot r/o PFO.    5. The mitral valve is degenerative. Trivial mitral valve regurgitation.  No evidence of mitral stenosis.   6. The aortic valve is tricuspid. Aortic valve regurgitation is not  visualized. Mild aortic valve sclerosis is present, with no evidence of  aortic valve stenosis.   7. The inferior vena cava is normal in size with greater than 50%  respiratory variability, suggesting right atrial pressure of 3 mmHg.   Cardiac monitor 07/26/2022 personally reviewed 100% atrial  flutter burden Heart rate 57-179, average 112 No triggered episodes Less than 1% ventricular ectopy  ASSESSMENT AND PLAN:  1.  Persistent atrial fibrillation/flutter: Currently on dofetilide and Eliquis.  CHA2DS2-VASc of 3.  Status post ablation in 2019 with repeat ablation 05/21/2022.  She did have persistent atrial fibrillation and flutter post ablation.  She is since converted to normal rhythm.  Due to that, we Christie Atkinson continue with current management.  2.  Obesity: Lifestyle modification encouraged Body mass index is 35.54 kg/m.  3.  Obstructive sleep apnea: Using oral device.  Compliance encouraged  4.  Second hypercoagulable state: Currently on Eliquis for atrial fibrillation  5.  High risk medication monitoring: Currently on dofetilide.  QTc remained stable.  Recent labs within normal limits.   Current medicines are reviewed at length with the patient today.   The patient does not have concerns regarding her medicines.  The following changes were made today: None  Labs/ tests ordered today include:  Orders Placed This Encounter  Procedures   EKG 12-Lead     Disposition:   FU 3 months  Signed, Loney Peto Meredith Leeds, MD  08/23/2022 10:19 AM     [facility] [address] [place] [address] [phone] (office) [phone] (fax)

## 2022-08-23 NOTE — Patient Instructions (Signed)
Medication Instructions:  Your physician recommends that you continue on your current medications as directed. Please refer to the Current Medication list given to you today.  *If you need a refill on your cardiac medications before your next appointment, please call your pharmacy*   Lab Work: None ordered   Testing/Procedures: None ordered   Follow-Up: At [facility], you and your health needs are our priority.  As part of our continuing mission to provide you with exceptional heart care, we have created designated Provider Care Teams.  These Care Teams include your primary Cardiologist (physician) and Advanced Practice Providers (APPs -  Physician Assistants and Nurse Practitioners) who all work together to provide you with the care you need, when you need it.  Your next appointment:   3 month(s)  The format for your next appointment:   In Person  Provider:   Will Camnitz, MD    Thank you for choosing [facility]!!   Jeronica Stlouis, RN [phone]     

## 2022-09-08 DIAGNOSIS — J4540 Moderate persistent asthma, uncomplicated: Secondary | ICD-10-CM | POA: Diagnosis not present

## 2022-09-08 DIAGNOSIS — J301 Allergic rhinitis due to pollen: Secondary | ICD-10-CM | POA: Diagnosis not present

## 2022-09-08 DIAGNOSIS — G4733 Obstructive sleep apnea (adult) (pediatric): Secondary | ICD-10-CM | POA: Diagnosis not present

## 2022-09-08 DIAGNOSIS — R5383 Other fatigue: Secondary | ICD-10-CM | POA: Diagnosis not present

## 2022-09-08 DIAGNOSIS — J479 Bronchiectasis, uncomplicated: Secondary | ICD-10-CM | POA: Diagnosis not present

## 2022-09-10 DIAGNOSIS — I4891 Unspecified atrial fibrillation: Secondary | ICD-10-CM | POA: Diagnosis not present

## 2022-09-10 DIAGNOSIS — E119 Type 2 diabetes mellitus without complications: Secondary | ICD-10-CM | POA: Diagnosis not present

## 2022-09-10 DIAGNOSIS — Z6835 Body mass index (BMI) 35.0-35.9, adult: Secondary | ICD-10-CM | POA: Diagnosis not present

## 2022-09-10 DIAGNOSIS — M1990 Unspecified osteoarthritis, unspecified site: Secondary | ICD-10-CM | POA: Diagnosis not present

## 2022-09-10 DIAGNOSIS — R635 Abnormal weight gain: Secondary | ICD-10-CM | POA: Diagnosis not present

## 2022-09-16 ENCOUNTER — Encounter: Payer: Self-pay | Admitting: Podiatry

## 2022-09-16 ENCOUNTER — Ambulatory Visit (INDEPENDENT_AMBULATORY_CARE_PROVIDER_SITE_OTHER): Payer: No Typology Code available for payment source | Admitting: Podiatry

## 2022-09-16 ENCOUNTER — Ambulatory Visit: Payer: Medicare HMO | Admitting: Podiatry

## 2022-09-16 VITALS — BP 126/79 | HR 77

## 2022-09-16 DIAGNOSIS — B351 Tinea unguium: Secondary | ICD-10-CM | POA: Diagnosis not present

## 2022-09-16 DIAGNOSIS — Z741 Need for assistance with personal care: Secondary | ICD-10-CM | POA: Insufficient documentation

## 2022-09-16 DIAGNOSIS — Z7409 Other reduced mobility: Secondary | ICD-10-CM | POA: Insufficient documentation

## 2022-09-16 DIAGNOSIS — Z7689 Persons encountering health services in other specified circumstances: Secondary | ICD-10-CM | POA: Insufficient documentation

## 2022-09-16 DIAGNOSIS — L84 Corns and callosities: Secondary | ICD-10-CM | POA: Diagnosis not present

## 2022-09-16 DIAGNOSIS — G629 Polyneuropathy, unspecified: Secondary | ICD-10-CM

## 2022-09-16 HISTORY — DX: Other reduced mobility: Z74.09

## 2022-09-16 HISTORY — DX: Persons encountering health services in other specified circumstances: Z76.89

## 2022-09-16 HISTORY — DX: Need for assistance with personal care: Z74.1

## 2022-09-16 NOTE — Progress Notes (Signed)
  Subjective:  Patient ID: Christie Atkinson, female    DOB: [birth year],  MRN: [id number]  Christie Atkinson presents to clinic today for   Chief Complaint  Patient presents with   Diabetes    [facility] BS - DON'T CHECK IT EVERYDAY  A1C - DK LVPCP - 06/2022   Patient states her right great toe cracked on the bottom and there was a piece of skin hanging and she pulled it off. She has been keeping it clean   PCP is [facility]  Allergies  Allergen Reactions   Nickel Hives and Itching   Elemental Sulfur Hives   Hydrocodone-Acetaminophen Nausea And Vomiting   Other Other (See Comments)    Polyester - including hospital gowns and sheets - allergic contact dermatitis   Oxycodone-Acetaminophen     Unknown reaction     Review of Systems: Negative except as noted in the HPI.  Objective: No changes noted in today's physical examination. Vitals:   09/16/22 1450  BP: 126/79  Pulse: 77   Christie Atkinson is a pleasant 67 y.o. female in NAD. AAO x 3.  Vascular Examination: Capillary refill time immediate b/l.Vascular status intact b/l with palpable pedal pulses. Pedal hair present b/l. No edema. No pain with calf compression b/l. Skin temperature gradient WNL b/l.   Neurological Examination: Protective sensation decreased with 10 gram monofilament b/l. Vibratory sensation diminished b/l.  Dermatological Examination: Pedal skin with normal turgor, texture and tone b/l. Toenails 1-5 b/l thick, discolored, elongated with subungual debris and pain on dorsal palpation.   Preulcerative lesion noted transverse lesion left forefoot. There is no surrounding erythema, no edema, no drainage, no odor, no fluctuance.  Hyperkeratotic lesion noted plantar IPJ of right great toe  Musculoskeletal Examination: Muscle strength 5/5 to all lower extremity muscle groups bilaterally. No gross bony deformities bilaterally. Limited joint ROM to the 1st MPJ right foot.  Radiographs:  None  Assessment/Plan: 1. Onychomycosis   2. Pre-ulcerative calluses   3. Peripheral polyneuropathy    Vascular Examination: CFT <3 seconds b/l. DP/PT pulses faintly palpable b/l. Skin temperature gradient warm to warm b/l. No pain with calf compression. No ischemia or gangrene. No cyanosis or clubbing noted b/l.    Neurological Examination: Sensation grossly intact b/l with 10 gram monofilament. Vibratory sensation intact b/l.   Dermatological Examination: Pedal skin warm and supple b/l.   No open wounds. No interdigital macerations.  Toenails 1-5 b/l thick, discolored, elongated with subungual debris and pain on dorsal palpation.    Hyperkeratotic lesion(s) plantar forefoot left foot and right great toe.  No erythema, no edema, no drainage, no fluctuance.  Musculoskeletal Examination: Muscle strength 5/5 to b/l LE. Limited joint ROM to the 1st MPJ right foot.  Radiographs: None  Return in about 3 months (around 12/16/2022).  Freddie Breech, DPM

## 2022-10-11 DIAGNOSIS — R635 Abnormal weight gain: Secondary | ICD-10-CM | POA: Diagnosis not present

## 2022-10-11 DIAGNOSIS — E119 Type 2 diabetes mellitus without complications: Secondary | ICD-10-CM | POA: Diagnosis not present

## 2022-10-11 DIAGNOSIS — I10 Essential (primary) hypertension: Secondary | ICD-10-CM | POA: Diagnosis not present

## 2022-10-11 DIAGNOSIS — Z6834 Body mass index (BMI) 34.0-34.9, adult: Secondary | ICD-10-CM | POA: Diagnosis not present

## 2022-11-11 DIAGNOSIS — R635 Abnormal weight gain: Secondary | ICD-10-CM | POA: Diagnosis not present

## 2022-11-11 DIAGNOSIS — I10 Essential (primary) hypertension: Secondary | ICD-10-CM | POA: Diagnosis not present

## 2022-11-11 DIAGNOSIS — E119 Type 2 diabetes mellitus without complications: Secondary | ICD-10-CM | POA: Diagnosis not present

## 2022-11-11 DIAGNOSIS — Z6834 Body mass index (BMI) 34.0-34.9, adult: Secondary | ICD-10-CM | POA: Diagnosis not present

## 2022-11-22 DIAGNOSIS — J4540 Moderate persistent asthma, uncomplicated: Secondary | ICD-10-CM | POA: Diagnosis not present

## 2022-11-22 DIAGNOSIS — G4733 Obstructive sleep apnea (adult) (pediatric): Secondary | ICD-10-CM | POA: Diagnosis not present

## 2022-11-22 DIAGNOSIS — R5383 Other fatigue: Secondary | ICD-10-CM | POA: Diagnosis not present

## 2022-11-22 DIAGNOSIS — J479 Bronchiectasis, uncomplicated: Secondary | ICD-10-CM | POA: Diagnosis not present

## 2022-11-22 DIAGNOSIS — J301 Allergic rhinitis due to pollen: Secondary | ICD-10-CM | POA: Diagnosis not present

## 2022-12-02 ENCOUNTER — Ambulatory Visit: Payer: No Typology Code available for payment source | Admitting: Podiatry

## 2022-12-06 ENCOUNTER — Ambulatory Visit: Payer: No Typology Code available for payment source | Attending: Cardiology | Admitting: Cardiology

## 2022-12-14 DIAGNOSIS — I10 Essential (primary) hypertension: Secondary | ICD-10-CM | POA: Diagnosis not present

## 2022-12-14 DIAGNOSIS — R635 Abnormal weight gain: Secondary | ICD-10-CM | POA: Diagnosis not present

## 2022-12-14 DIAGNOSIS — E119 Type 2 diabetes mellitus without complications: Secondary | ICD-10-CM | POA: Diagnosis not present

## 2022-12-14 DIAGNOSIS — Z6835 Body mass index (BMI) 35.0-35.9, adult: Secondary | ICD-10-CM | POA: Diagnosis not present

## 2022-12-14 DIAGNOSIS — K219 Gastro-esophageal reflux disease without esophagitis: Secondary | ICD-10-CM | POA: Diagnosis not present

## 2022-12-20 ENCOUNTER — Ambulatory Visit: Payer: No Typology Code available for payment source | Attending: Cardiology | Admitting: Cardiology

## 2022-12-20 ENCOUNTER — Encounter: Payer: Self-pay | Admitting: Cardiology

## 2022-12-20 ENCOUNTER — Telehealth: Payer: Self-pay | Admitting: Cardiology

## 2022-12-20 VITALS — BP 108/78 | HR 139 | Ht 65.0 in | Wt 219.0 lb

## 2022-12-20 DIAGNOSIS — G4733 Obstructive sleep apnea (adult) (pediatric): Secondary | ICD-10-CM | POA: Diagnosis not present

## 2022-12-20 DIAGNOSIS — Z79899 Other long term (current) drug therapy: Secondary | ICD-10-CM | POA: Diagnosis not present

## 2022-12-20 DIAGNOSIS — D6869 Other thrombophilia: Secondary | ICD-10-CM | POA: Diagnosis not present

## 2022-12-20 DIAGNOSIS — Z01812 Encounter for preprocedural laboratory examination: Secondary | ICD-10-CM

## 2022-12-20 DIAGNOSIS — I4819 Other persistent atrial fibrillation: Secondary | ICD-10-CM

## 2022-12-20 MED ORDER — METOPROLOL SUCCINATE ER 100 MG PO TB24: 100.0000 mg | ORAL_TABLET | Freq: Every day | ORAL | 6 refills | Status: DC

## 2022-12-20 NOTE — Telephone Encounter (Signed)
Took 50 mg of Metoprolol but experienced hypotension, she reports BP was 80/59. States that the NP in the AFib clinic is who stopped/changed this medication b/c it was dropping her BP too low. HR currently 80. Pt is not symptomatic currently as BP is improving slowly. She is asking should she really be taking this and if so should it be a lower dose? Or is asking should she cut her Diltiazem back to 1 tab in the am & pm instead of 2 in am and one in pm? Aware forwarding to MD for advisement.

## 2022-12-20 NOTE — Progress Notes (Signed)
Electrophysiology Office Note:   Date:  12/20/2022  ID:  Christie Atkinson, DOB September 30, 1955, MRN [id number]  Primary Cardiologist: Lesleigh Noe, MD (Inactive) Electrophysiologist: Jeneal Vogl Jorja Loa, MD      History of Present Illness:   Christie Atkinson is a 67 y.o. female with h/o atrial fibrillation, hypertension, diabetes, sleep apnea seen today for routine electrophysiology followup.  Since last being seen in our clinic the patient reports doing overall well.  She has noted an increased level of fatigue over the last few months.  She has been trying to go to the gym, but does not feel that she has the energy to do so.  She usually lives a very active lifestyle, but has not been as active as she would like over the last few months.  she denies chest pain, palpitations, dyspnea, PND, orthopnea, nausea, vomiting, dizziness, syncope, edema, weight gain, or early satiety.   Review of systems complete and found to be negative unless listed in HPI.   EP Information / Studies Reviewed:    EKG is ordered today. Personal review as below.  EKG Interpretation Date/Time:  Monday December 20 2022 09:40:59 EDT Ventricular Rate:  139 PR Interval:  124 QRS Duration:  80 QT Interval:  292 QTC Calculation: 444 R Axis:   88  Text Interpretation: Atrial flutter with 2 to 1 block When compared with ECG of 13-Jul-2022 09:46, Rate faster Confirmed by Zoeie Ritter ([id number]) on 12/20/2022 9:49:14 AM    Risk Assessment/Calculations:    CHA2DS2-VASc Score = 4   This indicates a 4.8% annual risk of stroke. The patient's score is based upon: CHF History: 0 HTN History: 1 Diabetes History: 1 Stroke History: 0 Vascular Disease History: 0 Age Score: 1 Gender Score: 1             Physical Exam:   VS:  BP 108/78   Pulse (!) 139   Ht 5\' 5"  (1.651 m)   Wt 219 lb (99.3 kg)   SpO2 97%   BMI 36.44 kg/m    Wt Readings from Last 3 Encounters:  12/20/22 219 lb (99.3 kg)  08/23/22 213 lb 9.6 oz (96.9  kg)  07/13/22 207 lb 12.8 oz (94.3 kg)     GEN: Well nourished, well developed in no acute distress NECK: No JVD; No carotid bruits CARDIAC: Irregularly irregular rate and rhythm, no murmurs, rubs, gallops RESPIRATORY:  Clear to auscultation without rales, wheezing or rhonchi  ABDOMEN: Soft, non-tender, non-distended EXTREMITIES:  No edema; No deformity   ASSESSMENT AND PLAN:    1.  Persistent atrial fibrillation/flutter: Currently on dofetilide and Eliquis.  Post ablation in 2019 with repeat ablation 05/21/2022.  Did have persistent atrial fibrillation and flutter post ablation.  She is unfortunately in an atypical atrial flutter today.  Her dofetilide is not keeping her in rhythm.  She Ruddy Swire need an ablation to get back into normal rhythm.  Risk and benefits have been discussed.  She understands the risks and is agreed to the procedure.  In the interim, we Curtis Uriarte start her on Toprol-XL 100 mg daily.  She Lenice Koper call us back in 2 weeks if her heart rates remain elevated.  Risk, benefits, and alternatives to EP study and radiofrequency ablation for afib were also discussed in detail today. These risks include but are not limited to stroke, bleeding, vascular damage, tamponade, perforation, damage to the esophagus, lungs, and other structures, pulmonary vein stenosis, worsening renal function, and death. The patient understands these  risk and wishes to proceed.  We Jenny Lai therefore proceed with catheter ablation at the next available time.  Carto, ICE, anesthesia are requested for the procedure.  Shawnia Vizcarrondo also obtain CT PV protocol prior to the procedure to exclude LAA thrombus and further evaluate atrial anatomy.   2.  Obesity: Lifestyle modification encouraged  3.  Obstructive sleep apnea: Using oral device.  Compliance encouraged  4.  Secondary hypercoagulable state: Currently on Eliquis for atrial fibrillation  5.  High risk medication monitoring: Currently on dofetilide.  QTc is remained stable.   Vinie Charity check BMP, magnesium, CBC today.  Follow up with Dr. Elberta Fortis as usual post procedure  Signed, Kerrie Latour Jorja Loa, MD

## 2022-12-20 NOTE — Patient Instructions (Addendum)
Medication Instructions:  Your physician has recommended you make the following change in your medication:  CHANGE the way you take Metoprolol Succinate (Toprol) -- take 100 mg daily at bedtime      (STOP taking it as needed)  *If you need a refill on your cardiac medications before your next appointment, please call your pharmacy*   Lab Work: Today:  BMET, & Magnesium Pre procedure labs -- see procedure instruction letter:  BMP & CBC  If you have labs (blood work) drawn today and your tests are completely normal, you will receive your results only by: MyChart Message (if you have MyChart) OR A paper copy in the mail If you have any lab test that is abnormal or we need to change your treatment, we will call you to review the results.   Testing/Procedures: Your physician has requested that you have cardiac CT within 7-10 days PRIOR to your ablation. Cardiac computed tomography (CT) is a painless test that uses an x-ray machine to take clear, detailed pictures of your heart.  Please follow instruction below located under "other instructions". You will get a call from our office to schedule the date for this test.  Your physician has recommended that you have an ablation. Catheter ablation is a medical procedure used to treat some cardiac arrhythmias (irregular heartbeats). During catheter ablation, a long, thin, flexible tube is put into a blood vessel in your groin (upper thigh), or neck. This tube is called an ablation catheter. It is then guided to your heart through the blood vessel. Radio frequency waves destroy small areas of heart tissue where abnormal heartbeats may cause an arrhythmia to start.   You will be scheduled for 04/27/2023.  The EP scheduler, April, will be in touch with procedure instructions.  You will be able to pick these instructions up at the [facility] office (since you do not use your mychart) and get lab work that will be needed closer to the procedure  date.   Follow-Up: At [facility], you and your health needs are our priority.  As part of our continuing mission to provide you with exceptional heart care, we have created designated Provider Care Teams.  These Care Teams include your primary Cardiologist (physician) and Advanced Practice Providers (APPs -  Physician Assistants and Nurse Practitioners) who all work together to provide you with the care you need, when you need it.  Your physician recommends that you schedule a follow-up appointment in: 6 weeks with Wallis Bamberg, NP   Your next appointment:   1 month(s) after your ablation  The format for your next appointment:   In Person  Provider:   AFib clinic   Thank you for choosing [facility]!!   Dory Horn, RN [phone]    Other Instructions   Cardiac Ablation Cardiac ablation is a procedure to destroy (ablate) some heart tissue that is sending bad signals. These bad signals cause problems in heart rhythm. The heart has many areas that make these signals. If there are problems in these areas, they can make the heart beat in a way that is not normal. Destroying some tissues can help make the heart rhythm normal. Tell your doctor about: Any allergies you have. All medicines you are taking. These include vitamins, herbs, eye drops, creams, and over-the-counter medicines. Any problems you or family members have had with medicines that make you fall asleep (anesthetics). Any blood disorders you have. Any surgeries you have had. Any medical conditions you have, such as  kidney failure. Whether you are pregnant or may be pregnant. What are the risks? This is a safe procedure. But problems may occur, including: Infection. Bruising and bleeding. Bleeding into the chest. Stroke or blood clots. Damage to nearby areas of your body. Allergies to medicines or dyes. The need for a pacemaker if the normal system is damaged. Failure of the procedure to treat  the problem. What happens before the procedure? Medicines Ask your doctor about: Changing or stopping your normal medicines. This is important. Taking aspirin and ibuprofen. Do not take these medicines unless your doctor tells you to take them. Taking other medicines, vitamins, herbs, and supplements. General instructions Follow instructions from your doctor about what you cannot eat or drink. Plan to have someone take you home from the hospital or clinic. If you will be going home right after the procedure, plan to have someone with you for 24 hours. Ask your doctor what steps will be taken to prevent infection. What happens during the procedure?  An IV tube will be put into one of your veins. You will be given a medicine to help you relax. The skin on your neck or groin will be numbed. A cut (incision) will be made in your neck or groin. A needle will be put through your cut and into a large vein. A tube (catheter) will be put into the needle. The tube will be moved to your heart. Dye may be put through the tube. This helps your doctor see your heart. Small devices (electrodes) on the tube will send out signals. A type of energy will be used to destroy some heart tissue. The tube will be taken out. Pressure will be held on your cut. This helps stop bleeding. A bandage will be put over your cut. The exact procedure may vary among doctors and hospitals. What happens after the procedure? You will be watched until you leave the hospital or clinic. This includes checking your heart rate, breathing rate, oxygen, and blood pressure. Your cut will be watched for bleeding. You will need to lie still for a few hours. Do not drive for 24 hours or as long as your doctor tells you. Summary Cardiac ablation is a procedure to destroy some heart tissue. This is done to treat heart rhythm problems. Tell your doctor about any medical conditions you may have. Tell him or her about all medicines you  are taking to treat them. This is a safe procedure. But problems may occur. These include infection, bruising, bleeding, and damage to nearby areas of your body. Follow what your doctor tells you about food and drink. You may also be told to change or stop some of your medicines. After the procedure, do not drive for 24 hours or as long as your doctor tells you. This information is not intended to replace advice given to you by your health care provider. Make sure you discuss any questions you have with your health care provider. Document Revised: 08/14/2021 Document Reviewed: 04/26/2019 Elsevier Patient Education  2023 [employer].

## 2022-12-20 NOTE — Telephone Encounter (Signed)
Pt c/o medication issue:  1. Name of Medication: metoprolol succinate (TOPROL-XL) 100 MG 24 hr tablet   2. How are you currently taking this medication (dosage and times per day)?    3. Are you having a reaction (difficulty breathing--STAT)? no  4. What is your medication issue? Patient states that the medication is dropping her bp. Please advise

## 2022-12-21 LAB — BASIC METABOLIC PANEL
BUN/Creatinine Ratio: 13 (ref 12–28)
BUN: 11 mg/dL (ref 8–27)
CO2: 24 mmol/L (ref 20–29)
Calcium: 9.3 mg/dL (ref 8.7–10.3)
Chloride: 104 mmol/L (ref 96–106)
Creatinine, Ser: 0.86 mg/dL (ref 0.57–1.00)
Glucose: 93 mg/dL (ref 70–99)
Potassium: 4.3 mmol/L (ref 3.5–5.2)
Sodium: 140 mmol/L (ref 134–144)
eGFR: 74 mL/min/{1.73_m2} (ref >59)

## 2022-12-21 LAB — MAGNESIUM: Magnesium: 2.4 mg/dL — ABNORMAL HIGH (ref 1.6–2.3)

## 2022-12-21 NOTE — Telephone Encounter (Signed)
Left message to call back  

## 2022-12-23 ENCOUNTER — Ambulatory Visit (INDEPENDENT_AMBULATORY_CARE_PROVIDER_SITE_OTHER): Payer: No Typology Code available for payment source | Admitting: Podiatry

## 2022-12-23 DIAGNOSIS — L84 Corns and callosities: Secondary | ICD-10-CM | POA: Diagnosis not present

## 2022-12-23 DIAGNOSIS — B351 Tinea unguium: Secondary | ICD-10-CM | POA: Diagnosis not present

## 2022-12-23 DIAGNOSIS — I739 Peripheral vascular disease, unspecified: Secondary | ICD-10-CM

## 2022-12-23 NOTE — Progress Notes (Unsigned)
    Subjective:  Patient ID: Christie Atkinson, female    DOB: Jan 09, 1956,  MRN: [id number]  Christie Atkinson presents to clinic today for:  Chief Complaint  Patient presents with   Nail Problem    Christie Atkinson   . Patient notes nails are thick and elongated, causing pain in shoe gear when ambulating.  She also has painful calluses on the plantar aspect of the right hallux near the sulcus, as well as broad calluses submet 2 through 4 bilateral.  According to patient's medical record, she is prediabetic but does take metformin 500 mg daily  PCP is Dr. Johney Frame.  Date last seen February 2024  Allergies  Allergen Reactions   Nickel Hives and Itching   Elemental Sulfur Hives   Hydrocodone-Acetaminophen Nausea And Vomiting   Other Other (See Comments)    Polyester - including Atkinson gowns and sheets - allergic contact dermatitis   Oxycodone-Acetaminophen     Unknown reaction     Review of Systems: Negative except as noted in the HPI.  Objective:  There were no vitals filed for this visit.  Christie Atkinson is a pleasant 67 y.o. female in NAD. AAO x 3.  Vascular Examination: Patient has palpable DP pulse, absent PT pulse bilateral.  Delayed capillary refill bilateral toes.  Sparse digital hair bilateral.  Proximal to distal cooling WNL bilateral.    Dermatological Examination: Interspaces are clear with no open lesions noted bilateral.  Nails are 3-29mm thick, with yellowish/brown discoloration, subungual debris and distal onycholysis x10.  There is pain with compression of nails x10.  There are hyperkeratotic lesions noted plantar right hallux proximal to the interphalangeal joint near the sulcus with a small superficial fissure in the skin in this area.  Also there are small hyperkeratotic lesions submet 2 through 4 bilateral.  Neurological Examination: Protective sensation intact b/l LE. Vibratory sensation intact b/l LE.  Musculoskeletal Examination: Muscle strength 5/5 to all LE muscle  groups b/l.   Patient qualifies for at-risk foot care because of prediabetes with PVD.  Assessment/Plan: 1. Dermatophytosis of nail   2. Callus of foot   3. PVD (peripheral vascular disease) (HCC)    Mycotic nails x10 were sharply debrided with sterile nail nippers and power debriding burr to decrease bulk and length.  Hyperkeratotic lesions x 3 were shaved with #312 blade.  Recommended Revitaderm 40 cream nightly to calluses  Return in about 3 months (around 03/25/2023) for [facility].   Clerance Lav, DPM, FACFAS [facility]     [address] [place], [place] [address]                Office [phone]  Fax [phone]

## 2022-12-25 ENCOUNTER — Encounter: Payer: Self-pay | Admitting: Podiatry

## 2022-12-25 DIAGNOSIS — G4733 Obstructive sleep apnea (adult) (pediatric): Secondary | ICD-10-CM

## 2022-12-25 HISTORY — DX: Obstructive sleep apnea (adult) (pediatric): G47.33

## 2022-12-31 DIAGNOSIS — M6701 Short Achilles tendon (acquired), right ankle: Secondary | ICD-10-CM | POA: Diagnosis not present

## 2022-12-31 DIAGNOSIS — M7661 Achilles tendinitis, right leg: Secondary | ICD-10-CM | POA: Insufficient documentation

## 2022-12-31 HISTORY — DX: Achilles tendinitis, right leg: M76.61

## 2023-01-11 DIAGNOSIS — R635 Abnormal weight gain: Secondary | ICD-10-CM | POA: Diagnosis not present

## 2023-01-11 DIAGNOSIS — Z6835 Body mass index (BMI) 35.0-35.9, adult: Secondary | ICD-10-CM | POA: Diagnosis not present

## 2023-01-11 DIAGNOSIS — E119 Type 2 diabetes mellitus without complications: Secondary | ICD-10-CM | POA: Diagnosis not present

## 2023-01-11 DIAGNOSIS — I10 Essential (primary) hypertension: Secondary | ICD-10-CM | POA: Diagnosis not present

## 2023-01-13 NOTE — Telephone Encounter (Signed)
Left message to call back  

## 2023-01-14 ENCOUNTER — Telehealth: Payer: Self-pay | Admitting: Cardiology

## 2023-01-14 DIAGNOSIS — M25562 Pain in left knee: Secondary | ICD-10-CM | POA: Diagnosis not present

## 2023-01-14 DIAGNOSIS — M25561 Pain in right knee: Secondary | ICD-10-CM | POA: Diagnosis not present

## 2023-01-14 DIAGNOSIS — M3500 Sicca syndrome, unspecified: Secondary | ICD-10-CM | POA: Diagnosis not present

## 2023-01-14 DIAGNOSIS — M2550 Pain in unspecified joint: Secondary | ICD-10-CM | POA: Diagnosis not present

## 2023-01-14 NOTE — Telephone Encounter (Signed)
Pt returning nurse's call. Please advise

## 2023-01-14 NOTE — Telephone Encounter (Signed)
Left message to call back  (Left detailed message w/ MD advisement, but told her to still return call and verbally speak w/ RN)

## 2023-01-14 NOTE — Telephone Encounter (Signed)
See 7/15  telephone note,

## 2023-01-14 NOTE — Telephone Encounter (Signed)
Patient is requesting call back from [place], [place], in regards to upcoming procedure.

## 2023-01-14 NOTE — Telephone Encounter (Signed)
Left message to call back  

## 2023-01-14 NOTE — Telephone Encounter (Signed)
Henson, April at 01/14/2023  8:36 AM  Status: Signed  Patient is requesting call back from [place], [place], in regards to upcoming procedure.

## 2023-01-31 ENCOUNTER — Telehealth: Payer: Self-pay | Admitting: Cardiology

## 2023-01-31 NOTE — Telephone Encounter (Signed)
Followed up w/ pt. She is aware Dr. Elberta Fortis does not think athletic heart is causing her afib and rationale given. States that [facility] ([facility]) said the same thing today at her appt w/ them. She does appreciate the return call and information.

## 2023-01-31 NOTE — Telephone Encounter (Signed)
Patient is wanting to know if Dr. Elberta Fortis has considered that she has an athletic heart that is causing her A-fib. She reports her weight Doctor told her years ago that athletes get A-fib due to working out too much and she believes the same for her. She is wanting to bring this to his attention due to stating she is unsure if she has ever mentioned it causing her to wonder if the ablation is needed. Patient [facility], so she would like a callback around 3:00-4:00 pm today.  Patient's appointment for tomorrow has been rescheduled for 09/13 due an appt conflict with the [place].

## 2023-02-01 ENCOUNTER — Ambulatory Visit: Payer: No Typology Code available for payment source | Admitting: Cardiology

## 2023-02-02 DIAGNOSIS — R200 Anesthesia of skin: Secondary | ICD-10-CM | POA: Diagnosis not present

## 2023-02-02 DIAGNOSIS — G5603 Carpal tunnel syndrome, bilateral upper limbs: Secondary | ICD-10-CM

## 2023-02-02 DIAGNOSIS — M65332 Trigger finger, left middle finger: Secondary | ICD-10-CM

## 2023-02-02 DIAGNOSIS — R7303 Prediabetes: Secondary | ICD-10-CM | POA: Diagnosis not present

## 2023-02-02 DIAGNOSIS — K8081 Other cholelithiasis with obstruction: Secondary | ICD-10-CM | POA: Diagnosis not present

## 2023-02-02 DIAGNOSIS — M65331 Trigger finger, right middle finger: Secondary | ICD-10-CM | POA: Insufficient documentation

## 2023-02-02 HISTORY — DX: Trigger finger, left middle finger: M65.332

## 2023-02-02 HISTORY — DX: Carpal tunnel syndrome, bilateral upper limbs: G56.03

## 2023-02-02 HISTORY — DX: Trigger finger, right middle finger: M65.331

## 2023-02-03 DIAGNOSIS — R200 Anesthesia of skin: Secondary | ICD-10-CM | POA: Insufficient documentation

## 2023-02-03 HISTORY — DX: Anesthesia of skin: R20.0

## 2023-02-16 DIAGNOSIS — J340 Abscess, furuncle and carbuncle of nose: Secondary | ICD-10-CM

## 2023-02-16 DIAGNOSIS — I4891 Unspecified atrial fibrillation: Secondary | ICD-10-CM | POA: Insufficient documentation

## 2023-02-16 DIAGNOSIS — G4730 Sleep apnea, unspecified: Secondary | ICD-10-CM | POA: Insufficient documentation

## 2023-02-16 DIAGNOSIS — M722 Plantar fascial fibromatosis: Secondary | ICD-10-CM | POA: Insufficient documentation

## 2023-02-16 DIAGNOSIS — I5189 Other ill-defined heart diseases: Secondary | ICD-10-CM | POA: Insufficient documentation

## 2023-02-16 DIAGNOSIS — I499 Cardiac arrhythmia, unspecified: Secondary | ICD-10-CM | POA: Insufficient documentation

## 2023-02-16 DIAGNOSIS — J3481 Nasal mucositis (ulcerative): Secondary | ICD-10-CM | POA: Insufficient documentation

## 2023-02-16 DIAGNOSIS — M25569 Pain in unspecified knee: Secondary | ICD-10-CM | POA: Insufficient documentation

## 2023-02-16 DIAGNOSIS — M1990 Unspecified osteoarthritis, unspecified site: Secondary | ICD-10-CM | POA: Insufficient documentation

## 2023-02-16 DIAGNOSIS — E119 Type 2 diabetes mellitus without complications: Secondary | ICD-10-CM | POA: Insufficient documentation

## 2023-02-16 DIAGNOSIS — M549 Dorsalgia, unspecified: Secondary | ICD-10-CM | POA: Insufficient documentation

## 2023-02-16 DIAGNOSIS — Z8719 Personal history of other diseases of the digestive system: Secondary | ICD-10-CM | POA: Insufficient documentation

## 2023-02-16 DIAGNOSIS — I10 Essential (primary) hypertension: Secondary | ICD-10-CM | POA: Insufficient documentation

## 2023-02-16 DIAGNOSIS — Z87442 Personal history of urinary calculi: Secondary | ICD-10-CM | POA: Insufficient documentation

## 2023-02-16 DIAGNOSIS — K219 Gastro-esophageal reflux disease without esophagitis: Secondary | ICD-10-CM | POA: Insufficient documentation

## 2023-02-16 DIAGNOSIS — M7730 Calcaneal spur, unspecified foot: Secondary | ICD-10-CM | POA: Insufficient documentation

## 2023-02-16 HISTORY — DX: Abscess, furuncle and carbuncle of nose: J34.0

## 2023-02-16 HISTORY — DX: Nasal mucositis (ulcerative): J34.81

## 2023-02-16 HISTORY — DX: Calcaneal spur, unspecified foot: M77.30

## 2023-02-17 NOTE — Progress Notes (Signed)
Cardiology Office Note:  .   Date:  02/18/2023  ID:  Christie Atkinson, DOB 1956/05/25, MRN [id number] PCP: Clinic, [facility] Providers Cardiologist:  Lesleigh Noe, MD (Inactive) Electrophysiologist:  Will Jorja Loa, MD    History of Present Illness: [name] is a 67 y.o. female with a past medical history of atrial fibrillation s/p ablation in 2019 and again in 2023, diastolic heart failure, history of DVT, hypertension, OSA on CPAP, GERD, DM 2.  Most recently evaluated by Dr. Elberta Fortis on 12/20/2022, she was most bothered by increased fatigue over the last few months.  She was found to be in atrial flutter with a 2-1 block, heart rate 139 bpm.  The decision was made to proceed with another ablation and she was agreeable to this.  She was started on Toprol 100 mg daily, however this dropped her BP significantly and it was later reduced to 25 mg daily.   She presents today for follow-up for atrial flutter, today she is in sinus rhythm, she is feeling well.  She is vacillating on whether or not she should proceed with upcoming ablation.  Trying to decipher how often she is going in and out of rhythm, she tells me that she is unaware when she is out of rhythm. She denies chest pain, palpitations, dyspnea, pnd, orthopnea, n, v, dizziness, syncope, edema, weight gain, or early satiety.   ROS: Review of Systems  All other systems reviewed and are negative.    Studies Reviewed: .        Cardiac Studies & Procedures   CARDIAC CATHETERIZATION  CARDIAC CATHETERIZATION 08/09/2017  Narrative  Normal left main.  Normal left anterior descending coronary artery.  Normal circumflex coronary artery.  Normal dominant RCA.  Normal LV size with normal hemodynamics and estimated ejection fraction of 50%.  False positive myocardial perfusion imaging.  Mild pulmonary hypertension with mean pressure of 27 mmHg.  RECOMMENDATIONS:   Management of atrial  fibrillation with aggressive rate control and stroke prophylaxis.  Findings Coronary Findings Diagnostic  Dominance: Right  Left Circumflex  First Obtuse Marginal Branch Vessel is small in size.  Second Obtuse Marginal Branch Vessel is large in size.  Third Obtuse Marginal Branch Vessel is small in size.  Intervention  No interventions have been documented.   STRESS TESTS  MYOCARDIAL PERFUSION IMAGING 07/22/2017  Narrative  Nuclear stress EF: 43%.  No T wave inversion was noted during stress.  There was no ST segment deviation noted during stress.  Defect 1: There is a medium defect of moderate severity.  Findings consistent with ischemia.  This is an intermediate risk study.  Medium size, moderate intensity reversible (SDS 5) mid to distal anterior and apical perfusion defect, suggestive of LAD territory ischemia. LVEF 43% with anterior, anteroseptal and apical hypokinesis. This is an intermediate risk study.   ECHOCARDIOGRAM  ECHOCARDIOGRAM COMPLETE 06/11/2020  Narrative ECHOCARDIOGRAM REPORT    Patient Name:   Christie Atkinson Date of Exam: 06/11/2020 Medical Rec #:  [id number]        Height:       64.0 in Accession #:    [id number]       Weight:       214.6 lb Date of Birth:  December 19, 1955        BSA:          2.016 m Patient Age:    35 years  BP:           130/75 mmHg Patient Gender: F                HR:           63 bpm. Exam Location:  Outpatient  Procedure: 2D Echo  Indications:    atrial fibrillation  History:        Patient has prior history of Echocardiogram examinations, most recent 12/14/2018. CHF; Arrythmias:Atrial Fibrillation.  Sonographer:    Delcie Roch RDCS Referring Phys: [phone] DONNA C CARROLL   Sonographer Comments: Image acquisition challenging due to patient body habitus. IMPRESSIONS   1. Left ventricular ejection fraction, by estimation, is 60 to 65%. The left ventricle has normal function. The left ventricle has no  regional wall motion abnormalities. Left ventricular diastolic parameters were normal. 2. Right ventricular systolic function is normal. The right ventricular size is normal. There is normal pulmonary artery systolic pressure. 3. Left atrial size was moderately dilated. 4. Cannot r/o PFO. 5. The mitral valve is degenerative. Trivial mitral valve regurgitation. No evidence of mitral stenosis. 6. The aortic valve is tricuspid. Aortic valve regurgitation is not visualized. Mild aortic valve sclerosis is present, with no evidence of aortic valve stenosis. 7. The inferior vena cava is normal in size with greater than 50% respiratory variability, suggesting right atrial pressure of 3 mmHg.  FINDINGS Left Ventricle: Left ventricular ejection fraction, by estimation, is 60 to 65%. The left ventricle has normal function. The left ventricle has no regional wall motion abnormalities. The left ventricular internal cavity size was normal in size. There is no left ventricular hypertrophy. Left ventricular diastolic parameters were normal.  Right Ventricle: The right ventricular size is normal. No increase in right ventricular wall thickness. Right ventricular systolic function is normal. There is normal pulmonary artery systolic pressure. The tricuspid regurgitant velocity is 2.35 m/s, and with an assumed right atrial pressure of 3 mmHg, the estimated right ventricular systolic pressure is 25.1 mmHg.  Left Atrium: Left atrial size was moderately dilated.  Right Atrium: Right atrial size was normal in size.  Pericardium: There is no evidence of pericardial effusion.  Mitral Valve: The mitral valve is degenerative in appearance. There is mild thickening of the mitral valve leaflet(s). There is mild calcification of the mitral valve leaflet(s). Mild mitral annular calcification. Trivial mitral valve regurgitation. No evidence of mitral valve stenosis.  Tricuspid Valve: The tricuspid valve is normal in  structure. Tricuspid valve regurgitation is mild . No evidence of tricuspid stenosis.  Aortic Valve: The aortic valve is tricuspid. Aortic valve regurgitation is not visualized. Mild aortic valve sclerosis is present, with no evidence of aortic valve stenosis.  Pulmonic Valve: The pulmonic valve was normal in structure. Pulmonic valve regurgitation is not visualized. No evidence of pulmonic stenosis.  Aorta: The aortic root is normal in size and structure.  Venous: The inferior vena cava is normal in size with greater than 50% respiratory variability, suggesting right atrial pressure of 3 mmHg.  IAS/Shunts: The interatrial septum was not well visualized.   LEFT VENTRICLE PLAX 2D LVIDd:         4.90 cm  Diastology LVIDs:         3.20 cm  LV e' medial:    5.98 cm/s LV PW:         1.95 cm  LV E/e' medial:  12.4 LV IVS:        0.90 cm  LV e' lateral:   9.36  cm/s LVOT diam:     1.90 cm  LV E/e' lateral: 7.9 LV SV:         59 LV SV Index:   29 LVOT Area:     2.84 cm   RIGHT VENTRICLE             IVC RV S prime:     16.50 cm/s  IVC diam: 1.30 cm TAPSE (M-mode): 1.9 cm  LEFT ATRIUM             Index       RIGHT ATRIUM           Index LA diam:        4.40 cm 2.18 cm/m  RA Area:     12.80 cm LA Vol (A2C):   71.3 ml 35.37 ml/m RA Volume:   28.90 ml  14.34 ml/m LA Vol (A4C):   74.1 ml 36.76 ml/m LA Biplane Vol: 75.9 ml 37.65 ml/m AORTIC VALVE LVOT Vmax:   102.00 cm/s LVOT Vmean:  66.700 cm/s LVOT VTI:    0.209 m  AORTA Ao Root diam: 3.50 cm  MITRAL VALVE               TRICUSPID VALVE MV Area (PHT): 2.73 cm    TR Peak grad:   22.1 mmHg MV Decel Time: 278 msec    TR Vmax:        235.00 cm/s MV E velocity: 74.10 cm/s MV A velocity: 63.40 cm/s  SHUNTS MV E/A ratio:  1.17        Systemic VTI:  0.21 m Systemic Diam: 1.90 cm  Charlton Haws MD Electronically signed by Charlton Haws MD Signature Date/Time: 06/11/2020/12:40:30 PM    Final   TEE  ECHO TEE  03/09/2018  Narrative *[place]* *[facility]* [address] [place], [place] [address] [phone]  ------------------------------------------------------------------- Transesophageal Echocardiography  Patient:    Cameren, Demello MR #:       [id number] Study Date: 03/09/2018 Gender:     F Age:        34 Height:     167.6 cm Weight:     105.9 kg BSA:        2.27 m^2 Pt. Status: Room:       6E09C  PERFORMING   Kristeen Miss, M.D. REFERRING    Kristeen Miss, M.D. ATTENDING    Hillis Range, MD ADMITTING    Nahser, Ali Lowe     Nahser, Jr SONOGRAPHER  Ilona Sorrel  cc:  ------------------------------------------------------------------- LV EF: 40% -   45%  ------------------------------------------------------------------- Indications:      Atrial fibrillation - 427.31.  ------------------------------------------------------------------- History:   PMH:   Congestive heart failure.  ------------------------------------------------------------------- Study Conclusions  - Left ventricle: Systolic function was mildly to moderately reduced. The estimated ejection fraction was in the range of 40% to 45%. - Aortic valve: No evidence of vegetation. - Mitral valve: No evidence of vegetation. There was mild regurgitation. - Left atrium: No evidence of thrombus in the atrial cavity or appendage. - Atrial septum: No defect or patent foramen ovale was identified. - Tricuspid valve: No evidence of vegetation. - Pulmonic valve: No evidence of vegetation. No evidence of vegetation.  ------------------------------------------------------------------- Study data:   Study status:  Routine.  Consent:  The risks, benefits, and alternatives to the procedure were explained to the patient and informed consent was obtained.  Procedure:  The patient reported no pain pre or post test. Initial setup. The patient was  brought to the laboratory. Surface ECG leads were  monitored. Sedation. Conscious sedation was administered by cardiology staff. Transesophageal echocardiography. A transesophageal probe was inserted by the attending cardiologistwithout difficulty. Image quality was adequate.  Study completion:  The patient tolerated the procedure well. There were no complications.  Administered medications:   Midazolam, 3mg , IV.  Fentanyl, , IV. Diagnostic transesophageal echocardiography.  2D and color Doppler. Birthdate:  Patient birthdate: [birth year].  Age:  Patient is 67 yr old.  Sex:  Gender: female.    BMI: 37.7 kg/m^2.  Blood pressure: 100/69  Patient status:  Inpatient.  Study date:  Study date: 03/09/2018. Study time: 08:15 AM.  Location:  Endoscopy.  -------------------------------------------------------------------  ------------------------------------------------------------------- Left ventricle:  Systolic function was mildly to moderately reduced. The estimated ejection fraction was in the range of 40% to 45%.  ------------------------------------------------------------------- Aortic valve:   Structurally normal valve.   Cusp separation was normal.  No evidence of vegetation.  Doppler:  There was no regurgitation.  ------------------------------------------------------------------- Aorta:  The aorta was normal, not dilated, and non-diseased.  ------------------------------------------------------------------- Mitral valve:   Structurally normal valve.   Leaflet separation was normal.  No evidence of vegetation.  Doppler:  There was mild regurgitation.  ------------------------------------------------------------------- Left atrium:   No evidence of thrombus in the atrial cavity or appendage.  ------------------------------------------------------------------- Atrial septum:  No defect or patent foramen ovale was identified.  ------------------------------------------------------------------- Pulmonic valve:     Structurally normal valve.   Cusp separation was normal.  No evidence of vegetation.  No evidence of vegetation. Doppler:  There was trivial regurgitation.  ------------------------------------------------------------------- Tricuspid valve:   Structurally normal valve.   Leaflet separation was normal.  No evidence of vegetation.  Doppler:  There was mild regurgitation.  ------------------------------------------------------------------- Post procedure conclusions Ascending Aorta:  - The aorta was normal, not dilated, and non-diseased.  ------------------------------------------------------------------- Prepared and Electronically Authenticated by  Kristeen Miss, M.D. 2019-10-03T16:49:06   MONITORS  LONG TERM MONITOR (3-14 DAYS) 07/26/2022  Narrative Patch Wear Time:  6 days and 21 hours  100% atrial flutter burden Heart rate 57-179, average 112 No triggered episodes Less than 1% ventricular ectopy  Will Elberta Fortis, MD           EKG Interpretation Date/Time:  Friday February 18 2023 11:29:04 EDT Ventricular Rate:  70 PR Interval:  174 QRS Duration:  82 QT Interval:  424 QTC Calculation: 457 R Axis:   89  Text Interpretation: Normal sinus rhythm Normal ECG When compared with ECG of 20-Dec-2022 09:40, Vent. rate has decreased BY  69 BPM Nonspecific T wave abnormality no longer evident in Inferior leads T wave amplitude has increased in Lateral leads Confirmed by Wallis Bamberg [phone]) on 02/18/2023 11:55:24 AM          Risk Assessment/Calculations:    CHA2DS2-VASc Score = 4   This indicates a 4.8% annual risk of stroke. The patient's score is based upon: CHF History: 0 HTN History: 1 Diabetes History: 1 Stroke History: 0 Vascular Disease History: 0 Age Score: 1 Gender Score: 1            Physical Exam:   VS:  BP (!) 100/58   Pulse 70   Ht 5\' 5"  (1.651 m)   Wt 216 lb 6.4 oz (98.2 kg)   SpO2 95%   BMI 36.01 kg/m    Wt Readings from Last 3 Encounters:   02/18/23 216 lb 6.4 oz (98.2 kg)  12/20/22 219 lb (99.3 kg)  08/23/22 213 lb 9.6 oz (96.9 kg)  GEN: Well nourished, well developed in no acute distress NECK: No JVD; No carotid bruits CARDIAC: RRR, no murmurs, rubs, gallops RESPIRATORY:  Clear to auscultation without rales, wheezing or rhonchi  ABDOMEN: Soft, non-tender, non-distended EXTREMITIES:  No edema; No deformity   ASSESSMENT AND PLAN: .   Persistent atrial fibrillation/flutter/hypercoagulable state/high risk medication use: S/p ablation in 2019 and again in 2023.  CHA2DS2-VASc score of 4, she is in sinus rhythm today.  She is vacillating on whether she should proceed with scheduled ablation or not.  Discussed that since she is not typically aware when she goes in atrial flutter that likely would be a good idea to proceed with this.  Will have her follow-up with Dr. Elberta Fortis in 6 weeks so they can have further discussion.  Continue Eliquis 5 mg twice daily--no indication for dose reduction, will repeat CBC today, recent creatinine in July was 0.86.  Continue diltiazem 120 mg twice daily.  Continue metoprolol 25 mg daily.  Continue Tikosyn 125 mg twice daily--QTc 457.  Obesity-BMI 36, she is currently working with a weight management team to help her lose weight.  She understands that this will help with her atrial fibrillation/flutter. Heart healthy diet and regular cardiovascular exercise encouraged.    HFpEF -most recent echo in 2022 revealed an EF of 60 to 65%, NYHA class I, euvolemic.  Continue Lasix, continue metoprolol 25 mg daily.  OSA-compliant with CPAP.       Dispo:  CBC today, follow up with Dr. Elberta Fortis for continued discussions surrounding upcoming ablation--she is not sure she wants to proceed and wants to discuss further.   Signed, Flossie Dibble, NP

## 2023-02-18 ENCOUNTER — Ambulatory Visit: Payer: No Typology Code available for payment source | Attending: Cardiology | Admitting: Cardiology

## 2023-02-18 ENCOUNTER — Encounter: Payer: Self-pay | Admitting: Cardiology

## 2023-02-18 VITALS — BP 100/58 | HR 70 | Ht 65.0 in | Wt 216.4 lb

## 2023-02-18 DIAGNOSIS — G4733 Obstructive sleep apnea (adult) (pediatric): Secondary | ICD-10-CM | POA: Diagnosis not present

## 2023-02-18 DIAGNOSIS — I4819 Other persistent atrial fibrillation: Secondary | ICD-10-CM

## 2023-02-18 DIAGNOSIS — R635 Abnormal weight gain: Secondary | ICD-10-CM | POA: Diagnosis not present

## 2023-02-18 DIAGNOSIS — D6869 Other thrombophilia: Secondary | ICD-10-CM | POA: Diagnosis not present

## 2023-02-18 DIAGNOSIS — I5032 Chronic diastolic (congestive) heart failure: Secondary | ICD-10-CM | POA: Diagnosis not present

## 2023-02-18 DIAGNOSIS — Z6834 Body mass index (BMI) 34.0-34.9, adult: Secondary | ICD-10-CM | POA: Diagnosis not present

## 2023-02-18 DIAGNOSIS — Z79899 Other long term (current) drug therapy: Secondary | ICD-10-CM

## 2023-02-18 DIAGNOSIS — E119 Type 2 diabetes mellitus without complications: Secondary | ICD-10-CM | POA: Diagnosis not present

## 2023-02-18 NOTE — Patient Instructions (Signed)
Medication Instructions:  Your physician recommends that you continue on your current medications as directed. Please refer to the Current Medication list given to you today.  *If you need a refill on your cardiac medications before your next appointment, please call your pharmacy*   Lab Work: Your physician recommends that you return for lab work in: Today for CBC  If you have labs (blood work) drawn today and your tests are completely normal, you will receive your results only by: MyChart Message (if you have MyChart) OR A paper copy in the mail If you have any lab test that is abnormal or we need to change your treatment, we will call you to review the results.   Testing/Procedures: NONE   Follow-Up: At [facility], you and your health needs are our priority.  As part of our continuing mission to provide you with exceptional heart care, we have created designated Provider Care Teams.  These Care Teams include your primary Cardiologist (physician) and Advanced Practice Providers (APPs -  Physician Assistants and Nurse Practitioners) who all work together to provide you with the care you need, when you need it.  We recommend signing up for the patient portal called "MyChart".  Sign up information is provided on this After Visit Summary.  MyChart is used to connect with patients for Virtual Visits (Telemedicine).  Patients are able to view lab/test results, encounter notes, upcoming appointments, etc.  Non-urgent messages can be sent to your provider as well.   To learn more about what you can do with MyChart, go to [email or web address].    Your next appointment:   6 week(s)  Provider:   Dr. Elberta Fortis

## 2023-02-19 LAB — CBC WITH DIFFERENTIAL/PLATELET
Basophils Absolute: 0.1 x10E3/uL (ref 0.0–0.2)
Basos: 1 %
EOS (ABSOLUTE): 0.1 x10E3/uL (ref 0.0–0.4)
Eos: 2 %
Hematocrit: 39.6 % (ref 34.0–46.6)
Hemoglobin: 13 g/dL (ref 11.1–15.9)
Immature Grans (Abs): 0 x10E3/uL (ref 0.0–0.1)
Immature Granulocytes: 0 %
Lymphocytes Absolute: 2.4 x10E3/uL (ref 0.7–3.1)
Lymphs: 36 %
MCH: 29.3 pg (ref 26.6–33.0)
MCHC: 32.8 g/dL (ref 31.5–35.7)
MCV: 89 fL (ref 79–97)
Monocytes Absolute: 0.7 x10E3/uL (ref 0.1–0.9)
Monocytes: 10 %
Neutrophils Absolute: 3.4 x10E3/uL (ref 1.4–7.0)
Neutrophils: 51 %
Platelets: 247 x10E3/uL (ref 150–450)
RBC: 4.43 x10E6/uL (ref 3.77–5.28)
RDW: 12.7 % (ref 11.7–15.4)
WBC: 6.7 x10E3/uL (ref 3.4–10.8)

## 2023-02-22 ENCOUNTER — Telehealth: Payer: Self-pay

## 2023-02-22 NOTE — Telephone Encounter (Signed)
Patient notified through my chart.

## 2023-02-22 NOTE — Telephone Encounter (Signed)
-----   Message from Flossie Dibble sent at 02/19/2023  8:37 PM EDT ----- CBC without anemia or infection. Good result.

## 2023-03-10 DIAGNOSIS — M79671 Pain in right foot: Secondary | ICD-10-CM | POA: Diagnosis not present

## 2023-03-14 DIAGNOSIS — M6701 Short Achilles tendon (acquired), right ankle: Secondary | ICD-10-CM | POA: Diagnosis not present

## 2023-03-14 DIAGNOSIS — M7661 Achilles tendinitis, right leg: Secondary | ICD-10-CM | POA: Diagnosis not present

## 2023-03-18 DIAGNOSIS — Z6835 Body mass index (BMI) 35.0-35.9, adult: Secondary | ICD-10-CM | POA: Diagnosis not present

## 2023-03-18 DIAGNOSIS — E119 Type 2 diabetes mellitus without complications: Secondary | ICD-10-CM | POA: Diagnosis not present

## 2023-03-18 DIAGNOSIS — R635 Abnormal weight gain: Secondary | ICD-10-CM | POA: Diagnosis not present

## 2023-03-23 ENCOUNTER — Ambulatory Visit (INDEPENDENT_AMBULATORY_CARE_PROVIDER_SITE_OTHER): Payer: No Typology Code available for payment source | Admitting: Podiatry

## 2023-03-23 ENCOUNTER — Encounter: Payer: Self-pay | Admitting: Podiatry

## 2023-03-23 DIAGNOSIS — I739 Peripheral vascular disease, unspecified: Secondary | ICD-10-CM | POA: Diagnosis not present

## 2023-03-23 DIAGNOSIS — M79671 Pain in right foot: Secondary | ICD-10-CM | POA: Diagnosis not present

## 2023-03-23 DIAGNOSIS — L84 Corns and callosities: Secondary | ICD-10-CM | POA: Diagnosis not present

## 2023-03-23 DIAGNOSIS — M7661 Achilles tendinitis, right leg: Secondary | ICD-10-CM | POA: Diagnosis not present

## 2023-03-23 DIAGNOSIS — B351 Tinea unguium: Secondary | ICD-10-CM | POA: Diagnosis not present

## 2023-03-23 NOTE — Progress Notes (Signed)
Subjective:  Patient ID: Christie Atkinson, female    DOB: Feb 27, 1956,  MRN: [id number]  Christie Atkinson presents to clinic today for:  Chief Complaint  Patient presents with   RFC    Pre DM. Nails and calluses. Also c/o right achilles tendinitis with heel spur, sees Dr. Victorino Dike in [place] for this.   Patient notes nails are thick and elongated, causing pain in shoe gear when ambulating.  She also has painful calluses on the plantar aspect of the right hallux near the sulcus, as well as broad calluses submet 2 through 4 bilateral.  Patient notes she has right Achilles tendinitis and is seeing an orthopedic specialist for this.  She is scheduled for an MRI in the near future.  She is currently going to physical therapy but she feels that this is making the pain worse.  States that she already has a night splint but she has not been wearing it.  PCP is Dr. Johney Frame.  Date last seen: September 2024  Allergies  Allergen Reactions   Nickel Hives and Itching   Elemental Sulfur Hives   Hydrocodone-Acetaminophen Nausea And Vomiting   Other Other (See Comments)    Polyester - including hospital gowns and sheets - allergic contact dermatitis   Oxycodone-Acetaminophen     Unknown reaction     Review of Systems: Negative except as noted in the HPI.  Objective:  There were no vitals filed for this visit.  Christie Atkinson is a pleasant 66 y.o. female in NAD. AAO x 3.  Vascular Examination: Patient has palpable DP pulse, absent PT pulse bilateral.  Delayed capillary refill bilateral toes.  Sparse digital hair bilateral.  Proximal to distal cooling WNL bilateral.    Dermatological Examination: Interspaces are clear with no open lesions noted bilateral.  Nails are 3-20mm thick, with yellowish/brown discoloration, subungual debris and distal onycholysis x10.  There is pain with compression of nails x10.  There are hyperkeratotic lesions noted plantar right hallux proximal to the  interphalangeal joint near the sulcus with a small superficial fissure in the skin in this area.  Also there are small hyperkeratotic lesions submet 2 through 4 bilateral.  Neurological Examination: Protective sensation intact b/l LE. Vibratory sensation intact b/l LE.  Musculoskeletal Examination: Muscle strength 5/5 to all LE muscle groups b/l.  Pain on palpation posterior right heel at the Achilles tendon insertion.  No gaps or nodules in the Achilles tendon.  Patient qualifies for at-risk foot care because of prediabetes with PVD.  Assessment/Plan: 1. Dermatophytosis of nail   2. Callus of foot   3. Right Achilles tendinitis   4. PVD (peripheral vascular disease) (HCC)    Mycotic nails x10 were sharply debrided with sterile nail nippers and power debriding burr to decrease bulk and length.  Hyperkeratotic lesions x 3 were shaved with #312 blade.  Patient was dispensed felt heel lifts to put some slack in the Achilles tendon while weightbearing to help with discomfort.  She was asked to keep [name] in the loop regarding her MRI results and plan of care with the orthopedic surgeon.  Return in about 3 months (around 06/23/2023) for [facility].   Clerance Lav, DPM, FACFAS [facility]     [address]  [place], [place] [address]                Office [phone]  Fax [phone]

## 2023-03-24 ENCOUNTER — Other Ambulatory Visit: Payer: Self-pay | Admitting: Orthopedic Surgery

## 2023-03-24 DIAGNOSIS — M25571 Pain in right ankle and joints of right foot: Secondary | ICD-10-CM

## 2023-03-25 DIAGNOSIS — G5603 Carpal tunnel syndrome, bilateral upper limbs: Secondary | ICD-10-CM | POA: Diagnosis not present

## 2023-03-28 DIAGNOSIS — M79671 Pain in right foot: Secondary | ICD-10-CM | POA: Diagnosis not present

## 2023-03-30 ENCOUNTER — Telehealth: Payer: Self-pay | Admitting: Cardiology

## 2023-03-30 ENCOUNTER — Ambulatory Visit: Payer: No Typology Code available for payment source | Admitting: Cardiology

## 2023-03-30 DIAGNOSIS — M79671 Pain in right foot: Secondary | ICD-10-CM | POA: Diagnosis not present

## 2023-03-30 NOTE — Telephone Encounter (Signed)
Left message to call back  

## 2023-03-30 NOTE — Telephone Encounter (Signed)
Patient came in office stating she would like to cx her ablation for now. States she is feeling fine and doing well with the medication change in July also no issues with Afib at this time.

## 2023-04-04 DIAGNOSIS — M65331 Trigger finger, right middle finger: Secondary | ICD-10-CM | POA: Diagnosis not present

## 2023-04-04 DIAGNOSIS — M65332 Trigger finger, left middle finger: Secondary | ICD-10-CM | POA: Diagnosis not present

## 2023-04-04 DIAGNOSIS — M7542 Impingement syndrome of left shoulder: Secondary | ICD-10-CM | POA: Diagnosis not present

## 2023-04-04 DIAGNOSIS — M79671 Pain in right foot: Secondary | ICD-10-CM | POA: Diagnosis not present

## 2023-04-04 DIAGNOSIS — G5603 Carpal tunnel syndrome, bilateral upper limbs: Secondary | ICD-10-CM | POA: Diagnosis not present

## 2023-04-06 DIAGNOSIS — M7542 Impingement syndrome of left shoulder: Secondary | ICD-10-CM | POA: Insufficient documentation

## 2023-04-06 DIAGNOSIS — M79671 Pain in right foot: Secondary | ICD-10-CM | POA: Diagnosis not present

## 2023-04-08 NOTE — Telephone Encounter (Signed)
Christie Atkinson can you follow up with this pt on Monday about this...Marland KitchenMarland Kitchen

## 2023-04-15 ENCOUNTER — Telehealth: Payer: Self-pay

## 2023-04-15 NOTE — Telephone Encounter (Signed)
Pt was scheduled to see Dr Elberta Fortis on 10/23 at [facility]. She went to the [facility] office because that is where she is normally seen. They just cancelled the appt and didn't notify anyone.   The CT imaging dept contacted me stating her Pre ablation CT was scheduled for 11/12 and she had not had her labs done. (They were supposed to be done at her 10/23 o/v)  I called the patient to reschedule her and she was having a lot of stomach pain and was going to the ED. She wanted to see what was going on with her pain before she has her Ablation. She also stated that she would have a new Insurance effective 08/2023 and would like to push out her Ablation to then.   I rescheduled her appointment with [facility] in [place] to 07/18/23.  Her ablation is scheduled for 09/02/23. CT is scheduled for 08/10/23.  Pt is aware that someone will go over all instructions with her at her visit or closer to her procedure date.

## 2023-04-18 DIAGNOSIS — M79671 Pain in right foot: Secondary | ICD-10-CM | POA: Diagnosis not present

## 2023-04-19 ENCOUNTER — Encounter: Payer: Self-pay | Admitting: Orthopedic Surgery

## 2023-04-19 ENCOUNTER — Ambulatory Visit (HOSPITAL_COMMUNITY): Admission: RE | Admit: 2023-04-19 | Payer: No Typology Code available for payment source | Source: Ambulatory Visit

## 2023-04-19 DIAGNOSIS — E119 Type 2 diabetes mellitus without complications: Secondary | ICD-10-CM | POA: Diagnosis not present

## 2023-04-19 DIAGNOSIS — Z6835 Body mass index (BMI) 35.0-35.9, adult: Secondary | ICD-10-CM | POA: Diagnosis not present

## 2023-04-19 DIAGNOSIS — R635 Abnormal weight gain: Secondary | ICD-10-CM | POA: Diagnosis not present

## 2023-04-21 ENCOUNTER — Other Ambulatory Visit: Payer: No Typology Code available for payment source

## 2023-04-27 DIAGNOSIS — M7542 Impingement syndrome of left shoulder: Secondary | ICD-10-CM | POA: Diagnosis not present

## 2023-04-27 DIAGNOSIS — M7541 Impingement syndrome of right shoulder: Secondary | ICD-10-CM | POA: Diagnosis not present

## 2023-05-16 DIAGNOSIS — J4540 Moderate persistent asthma, uncomplicated: Secondary | ICD-10-CM | POA: Diagnosis not present

## 2023-05-16 DIAGNOSIS — J479 Bronchiectasis, uncomplicated: Secondary | ICD-10-CM | POA: Diagnosis not present

## 2023-05-16 DIAGNOSIS — G4733 Obstructive sleep apnea (adult) (pediatric): Secondary | ICD-10-CM | POA: Diagnosis not present

## 2023-05-16 DIAGNOSIS — J301 Allergic rhinitis due to pollen: Secondary | ICD-10-CM | POA: Diagnosis not present

## 2023-06-16 DIAGNOSIS — R635 Abnormal weight gain: Secondary | ICD-10-CM | POA: Diagnosis not present

## 2023-06-16 DIAGNOSIS — E119 Type 2 diabetes mellitus without complications: Secondary | ICD-10-CM | POA: Diagnosis not present

## 2023-06-16 DIAGNOSIS — Z6835 Body mass index (BMI) 35.0-35.9, adult: Secondary | ICD-10-CM | POA: Diagnosis not present

## 2023-06-23 ENCOUNTER — Ambulatory Visit (INDEPENDENT_AMBULATORY_CARE_PROVIDER_SITE_OTHER): Payer: No Typology Code available for payment source | Admitting: Podiatry

## 2023-06-23 DIAGNOSIS — L84 Corns and callosities: Secondary | ICD-10-CM | POA: Diagnosis not present

## 2023-06-23 DIAGNOSIS — M79675 Pain in left toe(s): Secondary | ICD-10-CM | POA: Diagnosis not present

## 2023-06-23 DIAGNOSIS — I739 Peripheral vascular disease, unspecified: Secondary | ICD-10-CM | POA: Diagnosis not present

## 2023-06-23 DIAGNOSIS — B351 Tinea unguium: Secondary | ICD-10-CM | POA: Diagnosis not present

## 2023-06-23 DIAGNOSIS — M79674 Pain in right toe(s): Secondary | ICD-10-CM

## 2023-06-23 NOTE — Progress Notes (Signed)
Subjective:  Patient ID: Christie Atkinson, female    DOB: 05-Apr-1956,  MRN: [id number]  Christie Atkinson presents to clinic today for:  Chief Complaint  Patient presents with   [facility]    [facility], Still prediabetic. A1c is from [facility]. Last PCP in dec. [place]   Patient notes nails are thick and elongated, causing pain in shoe gear when ambulating.  She also has painful calluses on the ball of the foot, submet 3 and 4, bilateral.  She notes that she does not want the left fourth toenail trimmed very much because the skin underneath at the tip of the nail is very tender and bleeds easily.  PCP was last seen in December 2024  Past Medical History:  Diagnosis Date   Abdominal pain    Abnormal nuclear stress test 08/08/2017   Acquired trigger finger of left middle finger 02/02/2023   Acquired trigger finger of right middle finger 02/02/2023   Asthma    Atrial fibrillation (HCC)    Atrial fibrillation with RVR (HCC) 02/23/2015   Atrial flutter (HCC) 11/04/2021   Back pain    Bilateral carpal tunnel syndrome 02/02/2023   Bilateral chronic knee pain 04/22/2020   Bilateral plantar fasciitis 11/10/2018   Bilateral primary osteoarthritis of knee 12/01/2021   Calcaneal spur, unspecified foot 02/16/2023   Calculus of gallbladder with biliary obstruction but without cholecystitis    Cholelithiasis 02/22/2015   Chronic diastolic CHF (congestive heart failure) (HCC) 02/11/2018   Cold sore 12/01/2021   Congestive heart failure (HCC)    Deep vein thrombosis (DVT) (HCC) 07/29/2021   Jun 02, 2020 Entered By: Dorann Lodge Comment: age 68,  poss birth control induced  Formatting of this note might be different from the original.  Jun 02, 2020 Entered By: Dorann Lodge Comment: age 6,  poss birth control induced   Jun 02, 2020 Entered By: Dorann Lodge Comment: age 53,  poss birth  control induced     Diabetes mellitus without complication (HCC)    Diastolic dysfunction    Dysrhythmia     a fib   Dysthymic disorder 12/01/2021   Elevated blood pressure reading without diagnosis of hypertension 02/23/2015   Endometriosis of uterus 12/01/2021   Fibromyalgia    neuropathy feet   GERD (gastroesophageal reflux disease)    History of hiatal hernia    History of kidney stones    Hypertension    Insertional Achilles tendinopathy 11/10/2018   Knee pain    Left Achilles tendinitis 12/07/2018   Low back pain, unspecified 03/04/2022   Morbid obesity (HCC) 06/29/2017   Nasal mucositis (ulcerative) 02/16/2023   Nasal ulcer 02/16/2023   Need for assistance with personal care 09/16/2022   Numbness of foot 02/03/2023   Obstructive sleep apnea (adult) (pediatric) 12/25/2022   OSA on CPAP 07/29/2021   Osteoarthritis    Other persistent atrial fibrillation (HCC)    Other reduced mobility 09/16/2022   Pain in thoracic spine 12/01/2021   Persistent atrial fibrillation (HCC)    Persons encountering health services in other specified circumstances 09/16/2022   Plantar fasciitis    Prediabetes 12/01/2021   Preoperative cardiovascular examination 02/23/2015   Presence of left artificial knee joint 07/29/2021   Right Achilles tendinitis 12/31/2022   S/P ablation of atrial fibrillation 07/29/2021   Formatting of this note might be different from the original.  2019 allred [place] rf box of posterior wall     S/P total knee replacement 10/20/2020   Secondary  hypercoagulable state (HCC) 05/26/2022   Sleep apnea    wears oral appliance     Symptomatic cholelithiasis    Urinary incontinence 12/01/2021   Visit for monitoring Tikosyn therapy 02/07/2018    Allergies  Allergen Reactions   Nickel Hives and Itching   Elemental Sulfur Hives   Hydrocodone-Acetaminophen Nausea And Vomiting   Other Other (See Comments)    Polyester - including hospital gowns and sheets - allergic contact dermatitis   Oxycodone-Acetaminophen     Unknown reaction     Objective:  There were no vitals  filed for this visit.  Christie Atkinson is a pleasant 68 y.o. female in NAD. AAO x 3.  Vascular Examination: Patient has palpable DP pulse, absent PT pulse bilateral.  Delayed capillary refill bilateral toes.  Sparse digital hair bilateral.  Proximal to distal cooling WNL bilateral.    Dermatological Examination: Interspaces are clear with no open lesions noted bilateral.  Skin is shiny and atrophic bilateral.  Nails are 3-73mm thick, with yellowish/brown discoloration, subungual debris and distal onycholysis x10.  There is pain with compression of nails x10.  There are hyperkeratotic lesions noted submet 3/4 bilateral.  Patient qualifies for at-risk foot care because of PVD.  Assessment/Plan: 1. Pain due to onychomycosis of toenails of both feet   2. Callus of foot   3. PVD (peripheral vascular disease) (HCC)     Mycotic nails x10 were sharply debrided with sterile nail nippers and power debriding burr to decrease bulk and length.  Hyperkeratotic lesions x 4, submet 3/4 bilateral, were shaved with #312 blade.   Return in about 3 months (around 09/21/2023) for [facility].   Clerance Lav, DPM, FACFAS [facility]     [address] [place], [place] [address]                Office [phone]  Fax [phone]

## 2023-06-30 DIAGNOSIS — M25512 Pain in left shoulder: Secondary | ICD-10-CM | POA: Diagnosis not present

## 2023-06-30 DIAGNOSIS — M25511 Pain in right shoulder: Secondary | ICD-10-CM | POA: Diagnosis not present

## 2023-07-01 ENCOUNTER — Other Ambulatory Visit (HOSPITAL_COMMUNITY): Payer: Self-pay

## 2023-07-01 ENCOUNTER — Other Ambulatory Visit: Payer: Self-pay

## 2023-07-01 ENCOUNTER — Telehealth: Payer: Self-pay | Admitting: Cardiology

## 2023-07-01 MED ORDER — DOFETILIDE 125 MCG PO CAPS: 125.0000 ug | ORAL_CAPSULE | Freq: Two times a day (BID) | ORAL | 2 refills | Status: DC

## 2023-07-01 NOTE — Telephone Encounter (Signed)
*  STAT* If patient is at the pharmacy, call can be transferred to refill team.   1. Which medications need to be refilled? (please list name of each medication and dose if known) dofetilide (TIKOSYN) 125 MCG capsule   2. Which pharmacy/location (including street and city if local pharmacy) is medication to be sent to? [facility] [phone] - [place], [place] - [address] AT [facility] [address] Phone: [phone]  Fax: [phone]     3. Do they need a 30 day or 90 day supply? 30 Pt is out of medication

## 2023-07-08 DIAGNOSIS — M25511 Pain in right shoulder: Secondary | ICD-10-CM | POA: Diagnosis not present

## 2023-07-08 DIAGNOSIS — M25512 Pain in left shoulder: Secondary | ICD-10-CM | POA: Diagnosis not present

## 2023-07-15 DIAGNOSIS — M25512 Pain in left shoulder: Secondary | ICD-10-CM | POA: Diagnosis not present

## 2023-07-15 DIAGNOSIS — M25511 Pain in right shoulder: Secondary | ICD-10-CM | POA: Diagnosis not present

## 2023-07-18 ENCOUNTER — Encounter: Payer: Self-pay | Admitting: *Deleted

## 2023-07-18 ENCOUNTER — Encounter: Payer: Self-pay | Admitting: Cardiology

## 2023-07-18 ENCOUNTER — Ambulatory Visit: Payer: No Typology Code available for payment source | Attending: Cardiology | Admitting: Cardiology

## 2023-07-18 VITALS — BP 106/72 | HR 135 | Ht 65.0 in | Wt 222.6 lb

## 2023-07-18 DIAGNOSIS — D6869 Other thrombophilia: Secondary | ICD-10-CM

## 2023-07-18 DIAGNOSIS — Z79899 Other long term (current) drug therapy: Secondary | ICD-10-CM

## 2023-07-18 DIAGNOSIS — G4733 Obstructive sleep apnea (adult) (pediatric): Secondary | ICD-10-CM

## 2023-07-18 DIAGNOSIS — Z01812 Encounter for preprocedural laboratory examination: Secondary | ICD-10-CM

## 2023-07-18 DIAGNOSIS — I4819 Other persistent atrial fibrillation: Secondary | ICD-10-CM

## 2023-07-18 MED ORDER — AMIODARONE HCL 200 MG PO TABS: ORAL_TABLET | ORAL | 1 refills | Status: DC

## 2023-07-18 MED ORDER — METOPROLOL SUCCINATE ER 50 MG PO TB24: 25.0000 mg | ORAL_TABLET | Freq: Every day | ORAL | 1 refills | Status: DC

## 2023-07-18 MED ORDER — AMIODARONE HCL 200 MG PO TABS: ORAL_TABLET | ORAL | 3 refills | Status: DC

## 2023-07-18 MED ORDER — DILTIAZEM HCL ER COATED BEADS 120 MG PO CP24: ORAL_CAPSULE | ORAL | 2 refills | Status: AC

## 2023-07-18 MED ORDER — APIXABAN 5 MG PO TABS: 5.0000 mg | ORAL_TABLET | Freq: Two times a day (BID) | ORAL | 2 refills | Status: AC

## 2023-07-18 MED ORDER — FUROSEMIDE 20 MG PO TABS: 20.0000 mg | ORAL_TABLET | Freq: Every day | ORAL | 2 refills | Status: AC

## 2023-07-18 NOTE — Patient Instructions (Addendum)
 Medication Instructions:  Your physician has recommended you make the following change in your medication:  STOP Tikosyn  START Amiodarone   - take 2 tablets (400 mg total) TWICE a day for 2 weeks, then  - take 1 tablet (200 mg total) TWICE a day for 2 weeks, then  - take 1 tablet (200 mg total) ONCE a day  *If you need a refill on your cardiac medications before your next appointment, please call your pharmacy*   Lab Work: Today: TSH  & LFTs If you have labs (blood work) drawn today and your tests are completely normal, you will receive your results only by: MyChart Message (if you have MyChart) OR A paper copy in the mail If you have any lab test that is abnormal or we need to change your treatment, we will call you to review the results.   Testing/Procedures: Your CT is  going to  be rescheduled for 10/05/23.  We will be in touch with instructions as it gets closer to your ablation.  Your ablation is scheduled for 10/26/23.  We will be in touch with instructions as it gets closer to your ablation.   Follow-Up: At [facility], you and your health needs are our priority.  As part of our continuing mission to provide you with exceptional heart care, we have created designated Provider Care Teams.  These Care Teams include your primary Cardiologist (physician) and Advanced Practice Providers (APPs -  Physician Assistants and Nurse Practitioners) who all work together to provide you with the care you need, when you need it.  Your physician recommends that you schedule a follow-up appointment in: 2-3 weeks with Pattricia Bores, NP.  Your next appointment:   4 week(s) after your ablation  The format for your next appointment:   In Person  Provider:   You will follow up in the Atrial Fibrillation Clinic located at [facility]. Your provider will be: Clint R. Fenton, PA-C or Minnie Amber, PA-C{    Thank you for choosing [facility]!!   Reece Cane, RN [phone]

## 2023-07-18 NOTE — Progress Notes (Signed)
  Electrophysiology Office Note:   Date:  07/18/2023  ID:  Christie Atkinson, DOB 21-Jun-1955, MRN [id number]  Primary Cardiologist: None Primary Heart Failure: None Electrophysiologist: Trevin Gartrell Cortland Ding, MD      History of Present Illness:   Christie Atkinson is a 68 y.o. female with h/o atrial fibrillation, hypertension, diabetes, sleep apnea seen today for routine electrophysiology followup.   Since last being seen in our clinic the patient reports continued fatigue.  She has a fatigue intermittently.  She is in atrial flutter today with rapid rates.  She has been having trouble with trigger finger on her right hand and feels that she needs surgery.  She would like to delay her ablation.  she denies chest pain, palpitations, dyspnea, PND, orthopnea, nausea, vomiting, dizziness, syncope, edema, weight gain, or early satiety.   Review of systems complete and found to be negative unless listed in HPI.   EP Information / Studies Reviewed:    EKG is ordered today. Personal review as below.  EKG Interpretation Date/Time:  Monday July 18 2023 09:01:48 EST Ventricular Rate:  135 PR Interval:  88 QRS Duration:  82 QT Interval:  324 QTC Calculation: 486 R Axis:   92  Text Interpretation: Atrial flutter Rightward axis Borderline ECG When compared with ECG of 18-Feb-2023 11:29, Atrial flutter Confirmed by Madylin Fairbank ([id number]) on 07/18/2023 9:08:19 AM     Risk Assessment/Calculations:    CHA2DS2-VASc Score = 4   This indicates a 4.8% annual risk of stroke. The patient's score is based upon: CHF History: 0 HTN History: 1 Diabetes History: 1 Stroke History: 0 Vascular Disease History: 0 Age Score: 1 Gender Score: 1             Physical Exam:   VS:  BP 106/72   Pulse (!) 135   Ht 5\' 5"  (1.651 m)   Wt 222 lb 9.6 oz (101 kg)   SpO2 96%   BMI 37.04 kg/m    Wt Readings from Last 3 Encounters:  07/18/23 222 lb 9.6 oz (101 kg)  02/18/23 216 lb 6.4 oz (98.2 kg)  12/20/22 219  lb (99.3 kg)     GEN: Well nourished, well developed in no acute distress NECK: No JVD; No carotid bruits CARDIAC: Irregularly irregular rate and rhythm, no murmurs, rubs, gallops RESPIRATORY:  Clear to auscultation without rales, wheezing or rhonchi  ABDOMEN: Soft, non-tender, non-distended EXTREMITIES:  No edema; No deformity   ASSESSMENT AND PLAN:    1.  Persistent atrial fibrillation/flutter: Currently on dofetilide .  Post ablation in 2019 and 2023.  Had persistent atrial fibrillation and flutter post ablation.  Was previously planned for a another ablation, but for now would like to hold off until May.  As she is in atrial flutter and feeling poorly, we Syerra Abdelrahman stop dofetilide  and plan for amiodarone  load.  She Dasie Chancellor be on 200 mg chronically daily.  We Alby Schwabe have her see the PA in [place] in 2 to 3 weeks to discuss possible cardioversion.  2.  Obstructive sleep apnea: Using oral device.  Compliance encouraged.  3.  Secondary hypercoagulable state: Currently on alto 20 mg daily for atrial fibrillation  4.  High risk medication monitoring: Currently on dofetilide  125 mcg twice daily.  QTc stable.  BMP and magnesium  today for dofetilide  monitoring.  Follow up with Dr. Lawana Pray in 6 months  Signed, Milton Sagona Cortland Ding, MD

## 2023-07-18 NOTE — Addendum Note (Signed)
 Addended by: Alvenia Aus on: 07/18/2023 10:07 AM   Modules accepted: Orders

## 2023-07-18 NOTE — Addendum Note (Signed)
 Addended by: Alvenia Aus on: 07/18/2023 09:39 AM   Modules accepted: Orders

## 2023-07-19 DIAGNOSIS — M25512 Pain in left shoulder: Secondary | ICD-10-CM | POA: Diagnosis not present

## 2023-07-19 DIAGNOSIS — M25511 Pain in right shoulder: Secondary | ICD-10-CM | POA: Diagnosis not present

## 2023-07-19 LAB — HEPATIC FUNCTION PANEL
ALT: 12 [IU]/L (ref 0–32)
AST: 14 [IU]/L (ref 0–40)
Albumin: 4.1 g/dL (ref 3.9–4.9)
Alkaline Phosphatase: 91 [IU]/L (ref 44–121)
Bilirubin Total: 0.3 mg/dL (ref 0.0–1.2)
Bilirubin, Direct: 0.12 mg/dL (ref 0.00–0.40)
Total Protein: 6.6 g/dL (ref 6.0–8.5)

## 2023-07-19 LAB — TSH: TSH: 2.94 u[IU]/mL (ref 0.450–4.500)

## 2023-07-19 MED ORDER — AMIODARONE HCL 200 MG PO TABS: ORAL_TABLET | ORAL | 1 refills | Status: DC

## 2023-07-19 NOTE — Addendum Note (Signed)
Addended by: Daimon Kean, Elmarie Shiley L on: 07/19/2023 11:48 AM   Modules accepted: Orders

## 2023-07-20 ENCOUNTER — Telehealth: Payer: Self-pay | Admitting: Cardiology

## 2023-07-20 NOTE — Telephone Encounter (Signed)
   Pre-operative Risk Assessment    Patient Name: Christie Atkinson  DOB: 1955-08-15 MRN: [id number]   Date of last office visit: 07/18/2023 Date of next office visit: 08/01/2023   Request for Surgical Clearance    Procedure:   Right carpal tunnel release. Right middle finger A1 pulley release  Date of Surgery:  Clearance 08/30/23                                Surgeon:  Dr. Dominica Severin Surgeon's Group or Practice Name:  [facility] Phone number:  [phone] Fax number:  [phone] [address]   Type of Clearance Requested:   - Medical  - Pharmacy:  Hold Apixaban (Eliquis)     Type of Anesthesia:  Not Indicated   Additional requests/questions:    Sharen Hones   07/20/2023, 3:16 PM

## 2023-07-21 ENCOUNTER — Encounter (HOSPITAL_COMMUNITY): Payer: Self-pay

## 2023-07-21 NOTE — Telephone Encounter (Signed)
Patient with diagnosis of atrial fibrillation on Eliquis for anticoagulation.    Procedure:   Right carpal tunnel release. Right middle finger A1 pulley release   Date of Surgery:  Clearance 08/30/23     CHA2DS2-VASc Score = 5   This indicates a 7.2% annual risk of stroke. The patient's score is based upon: CHF History: 1 HTN History: 1 Diabetes History: 1 Stroke History: 0 Vascular Disease History: 0 Age Score: 1 Gender Score: 1   CrCl 100 Platelet count 247  Per office protocol, patient can hold Eliquis for 2 days prior to procedure.   Patient will not need bridging with Lovenox (enoxaparin) around procedure.  **This guidance is not considered finalized until pre-operative APP has relayed final recommendations.**

## 2023-07-21 NOTE — Telephone Encounter (Signed)
   Name: Christie Atkinson  DOB: 02-10-1956  MRN: [id number]  Primary Cardiologist: None  Chart reviewed as part of pre-operative protocol coverage. The patient has an upcoming visit scheduled with Wallis Bamberg, NP on 08/01/2023 at which time clearance can be addressed in case there are any issues that would impact surgical recommendations.  Surgery is not scheduled until 08/30/23 as below. I added preop FYI to appointment note so that provider is aware to address at time of outpatient visit.  Per office protocol the cardiology provider should forward their finalized clearance decision and recommendations regarding antiplatelet therapy to the requesting party below.    Per office protocol, patient can hold Eliquis for 2 days prior to procedure.   Patient will not need bridging with Lovenox (enoxaparin) around procedure.  I will route this message as FYI to requesting party and remove this message from the preop box as separate preop APP input not needed at this time.   Please call with any questions.  Denyce Robert, NP  07/21/2023, 12:03 PM

## 2023-07-29 DIAGNOSIS — M25511 Pain in right shoulder: Secondary | ICD-10-CM | POA: Diagnosis not present

## 2023-07-29 DIAGNOSIS — M25512 Pain in left shoulder: Secondary | ICD-10-CM | POA: Diagnosis not present

## 2023-07-31 ENCOUNTER — Encounter: Payer: Self-pay | Admitting: Cardiology

## 2023-07-31 NOTE — Progress Notes (Signed)
 Cardiology Office Note:  .   Date:  08/01/2023  ID:  Christie Atkinson, DOB 07-08-1955, MRN [id number] PCP: Clinic, [facility] Providers Cardiologist:  None Electrophysiologist:  Will Jorja Loa, MD    History of Present Illness: Christie Atkinson is a 68 y.o. female with a past medical history of atrial fibrillation s/p ablation in 2019 and again in 2023, diastolic heart failure, history of DVT, hypertension, OSA on CPAP, GERD, DM 2, small PFO.  05/21/2022 atrial fibrillation ablation 05/13/2022 cardiac CT small PFO, calcium score 0 06/11/2020 echo EF 60 to 65%, LA moderately dilated, cannot rule out PFO, mitral valve is degenerative with trivial MR, mild aortic valve sclerosis is present without stenosis 03/09/2018 atrial fibrillation ablation 08/09/2017 left heart cath normal coronary arteries 07/21/2017 Lexiscan medium defect of moderate severity findings consistent with ischemia >> false-positive stress evaluation  Evaluated 02/18/2023 for follow-up for atrial flutter, she was maintaining sinus rhythm, vacillating on whether or not to proceed with upcoming ablation.  Most recently evaluated by Dr. Elberta Fortis on 07/18/2023, her Tikosyn was stopped and she was started on amiodarone as she was noted to be in atrial flutter with rapid rates.  Ablation scheduled for 10/26/2023.  She presents today for preoperative evaluation for upcoming surgical procedure.  She has been doing well since she was last evaluated in the office, offers no complaints from a cardiac perspective.  She is trying to increase her physical activity around the home and work out more.  She is tolerating her amiodarone well, heart rate is well-controlled.  She is tolerating her Eliquis without any adverse side effects, denies hematochezia, hematuria, hemoptysis. She denies chest pain, palpitations, dyspnea, pnd, orthopnea, n, v, dizziness, syncope, edema, weight gain, or early satiety.    ROS: Review of  Systems  All other systems reviewed and are negative.    Studies Reviewed: .        Cardiac Studies & Procedures   ______________________________________________________________________________________________ CARDIAC CATHETERIZATION  CARDIAC CATHETERIZATION 08/09/2017  Narrative  Normal left main.  Normal left anterior descending coronary artery.  Normal circumflex coronary artery.  Normal dominant RCA.  Normal LV size with normal hemodynamics and estimated ejection fraction of 50%.  False positive myocardial perfusion imaging.  Mild pulmonary hypertension with mean pressure of 27 mmHg.  RECOMMENDATIONS:   Management of atrial fibrillation with aggressive rate control and stroke prophylaxis.  Findings Coronary Findings Diagnostic  Dominance: Right  Left Circumflex  First Obtuse Marginal Branch Vessel is small in size.  Second Obtuse Marginal Branch Vessel is large in size.  Third Obtuse Marginal Branch Vessel is small in size.  Intervention  No interventions have been documented.   STRESS TESTS  MYOCARDIAL PERFUSION IMAGING 07/22/2017  Narrative  Nuclear stress EF: 43%.  No T wave inversion was noted during stress.  There was no ST segment deviation noted during stress.  Defect 1: There is a medium defect of moderate severity.  Findings consistent with ischemia.  This is an intermediate risk study.  Medium size, moderate intensity reversible (SDS 5) mid to distal anterior and apical perfusion defect, suggestive of LAD territory ischemia. LVEF 43% with anterior, anteroseptal and apical hypokinesis. This is an intermediate risk study.   ECHOCARDIOGRAM  ECHOCARDIOGRAM COMPLETE 06/11/2020  Narrative ECHOCARDIOGRAM REPORT    Patient Name:   Christie Atkinson Date of Exam: 06/11/2020 Medical Rec #:  [id number]        Height:       64.0  in Accession #:    [id number]       Weight:       214.6 lb Date of Birth:  Nov 01, 1955        BSA:          2.016  m Patient Age:    64 years         BP:           130/75 mmHg Patient Gender: F                HR:           63 bpm. Exam Location:  Outpatient  Procedure: 2D Echo  Indications:    atrial fibrillation  History:        Patient has prior history of Echocardiogram examinations, most recent 12/14/2018. CHF; Arrythmias:Atrial Fibrillation.  Sonographer:    Delcie Roch RDCS Referring Phys: [phone] DONNA C CARROLL   Sonographer Comments: Image acquisition challenging due to patient body habitus. IMPRESSIONS   1. Left ventricular ejection fraction, by estimation, is 60 to 65%. The left ventricle has normal function. The left ventricle has no regional wall motion abnormalities. Left ventricular diastolic parameters were normal. 2. Right ventricular systolic function is normal. The right ventricular size is normal. There is normal pulmonary artery systolic pressure. 3. Left atrial size was moderately dilated. 4. Cannot r/o PFO. 5. The mitral valve is degenerative. Trivial mitral valve regurgitation. No evidence of mitral stenosis. 6. The aortic valve is tricuspid. Aortic valve regurgitation is not visualized. Mild aortic valve sclerosis is present, with no evidence of aortic valve stenosis. 7. The inferior vena cava is normal in size with greater than 50% respiratory variability, suggesting right atrial pressure of 3 mmHg.  FINDINGS Left Ventricle: Left ventricular ejection fraction, by estimation, is 60 to 65%. The left ventricle has normal function. The left ventricle has no regional wall motion abnormalities. The left ventricular internal cavity size was normal in size. There is no left ventricular hypertrophy. Left ventricular diastolic parameters were normal.  Right Ventricle: The right ventricular size is normal. No increase in right ventricular wall thickness. Right ventricular systolic function is normal. There is normal pulmonary artery systolic pressure. The tricuspid regurgitant  velocity is 2.35 m/s, and with an assumed right atrial pressure of 3 mmHg, the estimated right ventricular systolic pressure is 25.1 mmHg.  Left Atrium: Left atrial size was moderately dilated.  Right Atrium: Right atrial size was normal in size.  Pericardium: There is no evidence of pericardial effusion.  Mitral Valve: The mitral valve is degenerative in appearance. There is mild thickening of the mitral valve leaflet(s). There is mild calcification of the mitral valve leaflet(s). Mild mitral annular calcification. Trivial mitral valve regurgitation. No evidence of mitral valve stenosis.  Tricuspid Valve: The tricuspid valve is normal in structure. Tricuspid valve regurgitation is mild . No evidence of tricuspid stenosis.  Aortic Valve: The aortic valve is tricuspid. Aortic valve regurgitation is not visualized. Mild aortic valve sclerosis is present, with no evidence of aortic valve stenosis.  Pulmonic Valve: The pulmonic valve was normal in structure. Pulmonic valve regurgitation is not visualized. No evidence of pulmonic stenosis.  Aorta: The aortic root is normal in size and structure.  Venous: The inferior vena cava is normal in size with greater than 50% respiratory variability, suggesting right atrial pressure of 3 mmHg.  IAS/Shunts: The interatrial septum was not well visualized.   LEFT VENTRICLE PLAX 2D LVIDd:  4.90 cm  Diastology LVIDs:         3.20 cm  LV e' medial:    5.98 cm/s LV PW:         1.95 cm  LV E/e' medial:  12.4 LV IVS:        0.90 cm  LV e' lateral:   9.36 cm/s LVOT diam:     1.90 cm  LV E/e' lateral: 7.9 LV SV:         59 LV SV Index:   29 LVOT Area:     2.84 cm   RIGHT VENTRICLE             IVC RV S prime:     16.50 cm/s  IVC diam: 1.30 cm TAPSE (M-mode): 1.9 cm  LEFT ATRIUM             Index       RIGHT ATRIUM           Index LA diam:        4.40 cm 2.18 cm/m  RA Area:     12.80 cm LA Vol (A2C):   71.3 ml 35.37 ml/m RA Volume:   28.90  ml  14.34 ml/m LA Vol (A4C):   74.1 ml 36.76 ml/m LA Biplane Vol: 75.9 ml 37.65 ml/m AORTIC VALVE LVOT Vmax:   102.00 cm/s LVOT Vmean:  66.700 cm/s LVOT VTI:    0.209 m  AORTA Ao Root diam: 3.50 cm  MITRAL VALVE               TRICUSPID VALVE MV Area (PHT): 2.73 cm    TR Peak grad:   22.1 mmHg MV Decel Time: 278 msec    TR Vmax:        235.00 cm/s MV E velocity: 74.10 cm/s MV A velocity: 63.40 cm/s  SHUNTS MV E/A ratio:  1.17        Systemic VTI:  0.21 m Systemic Diam: 1.90 cm  Charlton Haws MD Electronically signed by Charlton Haws MD Signature Date/Time: 06/11/2020/12:40:30 PM    Final   TEE  ECHO TEE 03/09/2018  Narrative *[place]* *[facility]* [address] [place], [place] [address] [phone]  ------------------------------------------------------------------- Transesophageal Echocardiography  Patient:    Shantrice, Rodenberg MR #:       [id number] Study Date: 03/09/2018 Gender:     F Age:        32 Height:     167.6 cm Weight:     105.9 kg BSA:        2.27 m^2 Pt. Status: Room:       6E09C  PERFORMING   Kristeen Miss, M.D. REFERRING    Kristeen Miss, M.D. ATTENDING    Hillis Range, MD ADMITTING    Nahser, Ali Lowe     Nahser, Jr SONOGRAPHER  Ilona Sorrel  cc:  ------------------------------------------------------------------- LV EF: 40% -   45%  ------------------------------------------------------------------- Indications:      Atrial fibrillation - 427.31.  ------------------------------------------------------------------- History:   PMH:   Congestive heart failure.  ------------------------------------------------------------------- Study Conclusions  - Left ventricle: Systolic function was mildly to moderately reduced. The estimated ejection fraction was in the range of 40% to 45%. - Aortic valve: No evidence of vegetation. - Mitral valve: No evidence of vegetation. There was mild regurgitation. - Left  atrium: No evidence of thrombus in the atrial cavity or appendage. - Atrial septum: No defect or patent foramen ovale was identified. - Tricuspid valve: No evidence  of vegetation. - Pulmonic valve: No evidence of vegetation. No evidence of vegetation.  ------------------------------------------------------------------- Study data:   Study status:  Routine.  Consent:  The risks, benefits, and alternatives to the procedure were explained to the patient and informed consent was obtained.  Procedure:  The patient reported no pain pre or post test. Initial setup. The patient was brought to the laboratory. Surface ECG leads were monitored. Sedation. Conscious sedation was administered by cardiology staff. Transesophageal echocardiography. A transesophageal probe was inserted by the attending cardiologistwithout difficulty. Image quality was adequate.  Study completion:  The patient tolerated the procedure well. There were no complications.  Administered medications:   Midazolam, 3mg , IV.  Fentanyl, , IV. Diagnostic transesophageal echocardiography.  2D and color Doppler. Birthdate:  Patient birthdate: 26-Apr-1956.  Age:  Patient is 68 yr old.  Sex:  Gender: female.    BMI: 37.7 kg/m^2.  Blood pressure: 100/69  Patient status:  Inpatient.  Study date:  Study date: 03/09/2018. Study time: 08:15 AM.  Location:  Endoscopy.  -------------------------------------------------------------------  ------------------------------------------------------------------- Left ventricle:  Systolic function was mildly to moderately reduced. The estimated ejection fraction was in the range of 40% to 45%.  ------------------------------------------------------------------- Aortic valve:   Structurally normal valve.   Cusp separation was normal.  No evidence of vegetation.  Doppler:  There was no regurgitation.  ------------------------------------------------------------------- Aorta:  The aorta was  normal, not dilated, and non-diseased.  ------------------------------------------------------------------- Mitral valve:   Structurally normal valve.   Leaflet separation was normal.  No evidence of vegetation.  Doppler:  There was mild regurgitation.  ------------------------------------------------------------------- Left atrium:   No evidence of thrombus in the atrial cavity or appendage.  ------------------------------------------------------------------- Atrial septum:  No defect or patent foramen ovale was identified.  ------------------------------------------------------------------- Pulmonic valve:    Structurally normal valve.   Cusp separation was normal.  No evidence of vegetation.  No evidence of vegetation. Doppler:  There was trivial regurgitation.  ------------------------------------------------------------------- Tricuspid valve:   Structurally normal valve.   Leaflet separation was normal.  No evidence of vegetation.  Doppler:  There was mild regurgitation.  ------------------------------------------------------------------- Post procedure conclusions Ascending Aorta:  - The aorta was normal, not dilated, and non-diseased.  ------------------------------------------------------------------- Prepared and Electronically Authenticated by  Kristeen Miss, M.D. 2019-10-03T16:49:06  MONITORS  LONG TERM MONITOR (3-14 DAYS) 07/26/2022  Narrative Patch Wear Time:  6 days and 21 hours  100% atrial flutter burden Heart rate 57-179, average 112 No triggered episodes Less than 1% ventricular ectopy  Will Camnitz, MD       ______________________________________________________________________________________________                 Risk Assessment/Calculations:    CHA2DS2-VASc Score = 5   This indicates a 7.2% annual risk of stroke. The patient's score is based upon: CHF History: 1 HTN History: 1 Diabetes History: 1 Stroke History: 0 Vascular  Disease History: 0 Age Score: 1 Gender Score: 1            Physical Exam:   VS:  BP 110/72 (BP Location: Left Arm, Patient Position: Sitting, Cuff Size: Normal)   Pulse (!) 55   Ht 5\' 5"  (1.651 m)   Wt 223 lb (101.2 kg)   SpO2 96%   BMI 37.11 kg/m    Wt Readings from Last 3 Encounters:  08/01/23 223 lb (101.2 kg)  07/18/23 222 lb 9.6 oz (101 kg)  02/18/23 216 lb 6.4 oz (98.2 kg)    GEN: Well nourished, well developed in no acute distress NECK:  No JVD; No carotid bruits CARDIAC: RRR, no murmurs, rubs, gallops RESPIRATORY:  Clear to auscultation without rales, wheezing or rhonchi  ABDOMEN: Soft, non-tender, non-distended EXTREMITIES:  No edema; No deformity   ASSESSMENT AND PLAN: .   Persistent atrial fibrillation/flutter/hypercoagulable state/high risk medication use: S/p ablation in 2019 and again in 2023.  CHA2DS2-VASc score of 4, heart rate is controlled today.  Repeat ablation scheduled for 10/26/2023. Continue Eliquis 5 mg twice daily--no indication for dose reduction, will repeat CBC today, BMET today.  Continue diltiazem 120 mg twice daily.  Continue metoprolol 25 mg daily.  Taking amiodarone 200 mg daily--will repeat LFTs and thyroid panel.  Obesity-BMI 37, she is currently working with a weight management team to help her lose weight.  She understands that this will help with her atrial fibrillation/flutter. Heart healthy diet and regular cardiovascular exercise encouraged.    HFpEF -most recent echo in 2022 revealed an EF of 60 to 65%, NYHA class I, euvolemic.  Continue Lasix, continue metoprolol 25 mg daily.  OSA-compliant with CPAP.  Preoperative cardiovascular evaluation- According to the Revised Cardiac Risk Index (RCRI), her Perioperative Risk of Major Cardiac Event is (%): 0.9 Her Functional Capacity in METs is: 6.05 according to the Duke Activity Status Index (DASI). Therefore, based on ACC/AHA guidelines, patient would be at acceptable risk for the planned  procedure without further cardiovascular testing. I will route this recommendation to the requesting party via Epic fax function.  Regarding her Eliquis, per office protocol she can hold this for 2 days prior to her procedure and will not need bridging with Lovenox.  Please resumed Eliquis as soon as possible as directed by surgeon.  Disposition-CBC, BMET, TSH, follow-up with general cardiology in 6 months.       Signed, Flossie Dibble, NP

## 2023-08-01 ENCOUNTER — Ambulatory Visit: Payer: No Typology Code available for payment source | Attending: Cardiology | Admitting: Cardiology

## 2023-08-01 ENCOUNTER — Encounter: Payer: Self-pay | Admitting: Cardiology

## 2023-08-01 VITALS — BP 110/72 | HR 55 | Ht 65.0 in | Wt 223.0 lb

## 2023-08-01 DIAGNOSIS — I4819 Other persistent atrial fibrillation: Secondary | ICD-10-CM

## 2023-08-01 DIAGNOSIS — Z6838 Body mass index (BMI) 38.0-38.9, adult: Secondary | ICD-10-CM | POA: Diagnosis not present

## 2023-08-01 DIAGNOSIS — G4733 Obstructive sleep apnea (adult) (pediatric): Secondary | ICD-10-CM

## 2023-08-01 DIAGNOSIS — H2513 Age-related nuclear cataract, bilateral: Secondary | ICD-10-CM | POA: Insufficient documentation

## 2023-08-01 DIAGNOSIS — Z01810 Encounter for preprocedural cardiovascular examination: Secondary | ICD-10-CM

## 2023-08-01 DIAGNOSIS — I5032 Chronic diastolic (congestive) heart failure: Secondary | ICD-10-CM | POA: Diagnosis not present

## 2023-08-01 DIAGNOSIS — Z79899 Other long term (current) drug therapy: Secondary | ICD-10-CM

## 2023-08-01 DIAGNOSIS — E1140 Type 2 diabetes mellitus with diabetic neuropathy, unspecified: Secondary | ICD-10-CM | POA: Insufficient documentation

## 2023-08-01 DIAGNOSIS — R1032 Left lower quadrant pain: Secondary | ICD-10-CM | POA: Insufficient documentation

## 2023-08-01 DIAGNOSIS — E66812 Obesity, class 2: Secondary | ICD-10-CM

## 2023-08-01 DIAGNOSIS — D6869 Other thrombophilia: Secondary | ICD-10-CM

## 2023-08-01 DIAGNOSIS — E6609 Other obesity due to excess calories: Secondary | ICD-10-CM

## 2023-08-01 DIAGNOSIS — J4520 Mild intermittent asthma, uncomplicated: Secondary | ICD-10-CM | POA: Insufficient documentation

## 2023-08-01 DIAGNOSIS — H903 Sensorineural hearing loss, bilateral: Secondary | ICD-10-CM | POA: Insufficient documentation

## 2023-08-01 DIAGNOSIS — Z760 Encounter for issue of repeat prescription: Secondary | ICD-10-CM | POA: Insufficient documentation

## 2023-08-01 NOTE — Patient Instructions (Signed)
 Medication Instructions:  Your physician recommends that you continue on your current medications as directed. Please refer to the Current Medication list given to you today.  *If you need a refill on your cardiac medications before your next appointment, please call your pharmacy*   Lab Work: Your physician recommends that you have a CBC, CMP and TSH today in the office.  If you have labs (blood work) drawn today and your tests are completely normal, you will receive your results only by: MyChart Message (if you have MyChart) OR A paper copy in the mail If you have any lab test that is abnormal or we need to change your treatment, we will call you to review the results.   Testing/Procedures: None ordered   Follow-Up: At [facility], you and your health needs are our priority.  As part of our continuing mission to provide you with exceptional heart care, we have created designated Provider Care Teams.  These Care Teams include your primary Cardiologist (physician) and Advanced Practice Providers (APPs -  Physician Assistants and Nurse Practitioners) who all work together to provide you with the care you need, when you need it.  We recommend signing up for the patient portal called "MyChart".  Sign up information is provided on this After Visit Summary.  MyChart is used to connect with patients for Virtual Visits (Telemedicine).  Patients are able to view lab/test results, encounter notes, upcoming appointments, etc.  Non-urgent messages can be sent to your provider as well.   To learn more about what you can do with MyChart, go to [email or web address].    Your next appointment:   6 month(s)  The format for your next appointment:   In Person  Provider:   Wallis Bamberg, NP Rosalita Levan)    Other Instructions none  Important Information About Sugar

## 2023-08-02 LAB — COMPREHENSIVE METABOLIC PANEL WITH GFR
ALT: 14 IU/L (ref 0–32)
AST: 19 IU/L (ref 0–40)
Albumin: 4.1 g/dL (ref 3.9–4.9)
Alkaline Phosphatase: 96 IU/L (ref 44–121)
BUN/Creatinine Ratio: 15 (ref 12–28)
BUN: 17 mg/dL (ref 8–27)
Bilirubin Total: 0.3 mg/dL (ref 0.0–1.2)
CO2: 22 mmol/L (ref 20–29)
Calcium: 9.2 mg/dL (ref 8.7–10.3)
Chloride: 103 mmol/L (ref 96–106)
Creatinine, Ser: 1.11 mg/dL — ABNORMAL HIGH (ref 0.57–1.00)
Globulin, Total: 2.8 g/dL (ref 1.5–4.5)
Glucose: 99 mg/dL (ref 70–99)
Potassium: 4.3 mmol/L (ref 3.5–5.2)
Sodium: 138 mmol/L (ref 134–144)
Total Protein: 6.9 g/dL (ref 6.0–8.5)
eGFR: 54 mL/min/1.73 — ABNORMAL LOW

## 2023-08-02 LAB — CBC
Hematocrit: 41.2 % (ref 34.0–46.6)
Hemoglobin: 13.4 g/dL (ref 11.1–15.9)
MCH: 30.2 pg (ref 26.6–33.0)
MCHC: 32.5 g/dL (ref 31.5–35.7)
MCV: 93 fL (ref 79–97)
Platelets: 276 x10E3/uL (ref 150–450)
RBC: 4.44 x10E6/uL (ref 3.77–5.28)
RDW: 13 % (ref 11.7–15.4)
WBC: 7.1 x10E3/uL (ref 3.4–10.8)

## 2023-08-02 LAB — TSH: TSH: 6.28 u[IU]/mL — ABNORMAL HIGH (ref 0.450–4.500)

## 2023-08-04 ENCOUNTER — Telehealth: Payer: Self-pay | Admitting: Emergency Medicine

## 2023-08-04 NOTE — Telephone Encounter (Signed)
Voicemail is full at this time

## 2023-08-04 NOTE — Telephone Encounter (Signed)
 Pt returning call

## 2023-08-04 NOTE — Telephone Encounter (Signed)
 Attempted to return patients call and voicemail was full.

## 2023-08-04 NOTE — Telephone Encounter (Signed)
-----   Message from Flossie Dibble sent at 08/02/2023  7:35 AM EST ----- Thyroid is elevated, this is likely from the amiodarone and it not atypical.   Repeat thyroid panel in 8 weeks.

## 2023-08-05 DIAGNOSIS — M25511 Pain in right shoulder: Secondary | ICD-10-CM | POA: Diagnosis not present

## 2023-08-05 DIAGNOSIS — M25512 Pain in left shoulder: Secondary | ICD-10-CM | POA: Diagnosis not present

## 2023-08-08 ENCOUNTER — Encounter: Payer: Self-pay | Admitting: Emergency Medicine

## 2023-08-08 NOTE — Telephone Encounter (Signed)
 VM full 3/3.  Attempted to contact patient x3. Will mail letter

## 2023-08-10 ENCOUNTER — Other Ambulatory Visit (HOSPITAL_COMMUNITY): Payer: No Typology Code available for payment source

## 2023-08-16 DIAGNOSIS — E119 Type 2 diabetes mellitus without complications: Secondary | ICD-10-CM | POA: Diagnosis not present

## 2023-08-16 DIAGNOSIS — Z6835 Body mass index (BMI) 35.0-35.9, adult: Secondary | ICD-10-CM | POA: Diagnosis not present

## 2023-08-16 DIAGNOSIS — I4891 Unspecified atrial fibrillation: Secondary | ICD-10-CM | POA: Diagnosis not present

## 2023-08-16 DIAGNOSIS — I10 Essential (primary) hypertension: Secondary | ICD-10-CM | POA: Diagnosis not present

## 2023-08-16 DIAGNOSIS — R635 Abnormal weight gain: Secondary | ICD-10-CM | POA: Diagnosis not present

## 2023-08-17 DIAGNOSIS — M25511 Pain in right shoulder: Secondary | ICD-10-CM | POA: Diagnosis not present

## 2023-08-17 DIAGNOSIS — M25512 Pain in left shoulder: Secondary | ICD-10-CM | POA: Diagnosis not present

## 2023-08-22 DIAGNOSIS — J4540 Moderate persistent asthma, uncomplicated: Secondary | ICD-10-CM | POA: Diagnosis not present

## 2023-08-22 DIAGNOSIS — J301 Allergic rhinitis due to pollen: Secondary | ICD-10-CM | POA: Diagnosis not present

## 2023-08-22 DIAGNOSIS — J479 Bronchiectasis, uncomplicated: Secondary | ICD-10-CM | POA: Diagnosis not present

## 2023-08-22 DIAGNOSIS — G4733 Obstructive sleep apnea (adult) (pediatric): Secondary | ICD-10-CM | POA: Diagnosis not present

## 2023-09-01 DIAGNOSIS — Z0000 Encounter for general adult medical examination without abnormal findings: Secondary | ICD-10-CM | POA: Diagnosis not present

## 2023-09-01 DIAGNOSIS — Z9181 History of falling: Secondary | ICD-10-CM | POA: Diagnosis not present

## 2023-09-02 DIAGNOSIS — G5601 Carpal tunnel syndrome, right upper limb: Secondary | ICD-10-CM | POA: Diagnosis not present

## 2023-09-02 DIAGNOSIS — M65331 Trigger finger, right middle finger: Secondary | ICD-10-CM | POA: Diagnosis not present

## 2023-09-15 DIAGNOSIS — M79641 Pain in right hand: Secondary | ICD-10-CM | POA: Diagnosis not present

## 2023-09-21 ENCOUNTER — Ambulatory Visit (INDEPENDENT_AMBULATORY_CARE_PROVIDER_SITE_OTHER): Payer: Self-pay | Admitting: Podiatry

## 2023-09-21 DIAGNOSIS — M79673 Pain in unspecified foot: Secondary | ICD-10-CM

## 2023-09-24 ENCOUNTER — Other Ambulatory Visit: Payer: Self-pay | Admitting: Cardiology

## 2023-09-26 NOTE — Progress Notes (Signed)
 Patient cancelled her scheduled appt on 09/21/23

## 2023-09-27 ENCOUNTER — Other Ambulatory Visit: Payer: Self-pay

## 2023-09-27 NOTE — Telephone Encounter (Signed)
This is a [place] pt °

## 2023-09-28 DIAGNOSIS — M79641 Pain in right hand: Secondary | ICD-10-CM | POA: Diagnosis not present

## 2023-09-29 DIAGNOSIS — S60221A Contusion of right hand, initial encounter: Secondary | ICD-10-CM | POA: Diagnosis not present

## 2023-09-29 DIAGNOSIS — Z4789 Encounter for other orthopedic aftercare: Secondary | ICD-10-CM | POA: Diagnosis not present

## 2023-09-29 DIAGNOSIS — S40011A Contusion of right shoulder, initial encounter: Secondary | ICD-10-CM | POA: Diagnosis not present

## 2023-10-03 ENCOUNTER — Ambulatory Visit (HOSPITAL_COMMUNITY): Payer: No Typology Code available for payment source | Admitting: Physician Assistant

## 2023-10-05 ENCOUNTER — Ambulatory Visit (HOSPITAL_COMMUNITY): Admission: RE | Admit: 2023-10-05 | Source: Ambulatory Visit

## 2023-10-05 ENCOUNTER — Ambulatory Visit (HOSPITAL_COMMUNITY)
Admission: RE | Admit: 2023-10-05 | Discharge: 2023-10-05 | Disposition: A | Discharge status: Home / Self Care | Payer: No Typology Code available for payment source | Source: Ambulatory Visit | Attending: Cardiology | Admitting: Cardiology

## 2023-10-07 ENCOUNTER — Other Ambulatory Visit: Payer: Self-pay | Admitting: Cardiology

## 2023-10-07 ENCOUNTER — Telehealth: Payer: Self-pay | Admitting: Cardiology

## 2023-10-07 NOTE — Telephone Encounter (Signed)
 Pt is cancelling ablation at this time, not sure what she is going to do.  Aware cancelling post procedure follow up and scheduling her to see Dr. Lawana Pray this summer to further disucss next steps in plan of care/rescheduling procedure.  Further explained that Amiodarone  is not meant to be long term for her and pt understands this, just feeling much better since starting the medication.  Forwarding to scheduler to arrange follow up this summer (June/July/August) and we can go from there. Patient verbalized understanding and agreeable to plan.

## 2023-10-07 NOTE — Telephone Encounter (Signed)
 Pt requesting a c/b from Sherri. Please advise

## 2023-10-13 DIAGNOSIS — M79672 Pain in left foot: Secondary | ICD-10-CM | POA: Diagnosis not present

## 2023-10-13 DIAGNOSIS — R000 Tachycardia, unspecified: Secondary | ICD-10-CM | POA: Diagnosis not present

## 2023-10-13 DIAGNOSIS — S99912A Unspecified injury of left ankle, initial encounter: Secondary | ICD-10-CM | POA: Diagnosis not present

## 2023-10-13 DIAGNOSIS — T1490XA Injury, unspecified, initial encounter: Secondary | ICD-10-CM | POA: Diagnosis not present

## 2023-10-13 DIAGNOSIS — W108XXA Fall (on) (from) other stairs and steps, initial encounter: Secondary | ICD-10-CM | POA: Diagnosis not present

## 2023-10-17 DIAGNOSIS — M79641 Pain in right hand: Secondary | ICD-10-CM | POA: Diagnosis not present

## 2023-10-17 DIAGNOSIS — Z6835 Body mass index (BMI) 35.0-35.9, adult: Secondary | ICD-10-CM | POA: Diagnosis not present

## 2023-10-17 DIAGNOSIS — R635 Abnormal weight gain: Secondary | ICD-10-CM | POA: Diagnosis not present

## 2023-10-17 DIAGNOSIS — I10 Essential (primary) hypertension: Secondary | ICD-10-CM | POA: Diagnosis not present

## 2023-10-17 DIAGNOSIS — E119 Type 2 diabetes mellitus without complications: Secondary | ICD-10-CM | POA: Diagnosis not present

## 2023-10-17 DIAGNOSIS — I4891 Unspecified atrial fibrillation: Secondary | ICD-10-CM | POA: Diagnosis not present

## 2023-10-18 ENCOUNTER — Other Ambulatory Visit (HOSPITAL_COMMUNITY)

## 2023-10-19 DIAGNOSIS — M65332 Trigger finger, left middle finger: Secondary | ICD-10-CM | POA: Diagnosis not present

## 2023-10-19 DIAGNOSIS — M79641 Pain in right hand: Secondary | ICD-10-CM | POA: Diagnosis not present

## 2023-10-19 DIAGNOSIS — G5602 Carpal tunnel syndrome, left upper limb: Secondary | ICD-10-CM | POA: Diagnosis not present

## 2023-10-19 DIAGNOSIS — M65331 Trigger finger, right middle finger: Secondary | ICD-10-CM | POA: Diagnosis not present

## 2023-10-19 DIAGNOSIS — Z4789 Encounter for other orthopedic aftercare: Secondary | ICD-10-CM | POA: Diagnosis not present

## 2023-10-20 DIAGNOSIS — M1380 Other specified arthritis, unspecified site: Secondary | ICD-10-CM | POA: Diagnosis not present

## 2023-10-26 ENCOUNTER — Ambulatory Visit (HOSPITAL_COMMUNITY): Admit: 2023-10-26 | Payer: No Typology Code available for payment source | Admitting: Cardiology

## 2023-10-26 ENCOUNTER — Encounter (HOSPITAL_COMMUNITY): Payer: Self-pay

## 2023-10-26 DIAGNOSIS — M545 Low back pain, unspecified: Secondary | ICD-10-CM | POA: Diagnosis not present

## 2023-10-26 SURGERY — ATRIAL FIBRILLATION ABLATION
Anesthesia: General

## 2023-10-27 DIAGNOSIS — G4733 Obstructive sleep apnea (adult) (pediatric): Secondary | ICD-10-CM | POA: Diagnosis not present

## 2023-10-27 DIAGNOSIS — J479 Bronchiectasis, uncomplicated: Secondary | ICD-10-CM | POA: Diagnosis not present

## 2023-10-27 DIAGNOSIS — J4540 Moderate persistent asthma, uncomplicated: Secondary | ICD-10-CM | POA: Diagnosis not present

## 2023-11-03 NOTE — Telephone Encounter (Signed)
 Lvm to schedule follow up with WC. transer to [facility].

## 2023-11-03 NOTE — Telephone Encounter (Signed)
 Call x2 - attempted to contact patient to schedule follow up in June-August since ablation was canceled - vm box full, opted to send SMS notifcation w/ direct contact to reach me, pt is not responsive on mychart.

## 2023-11-07 ENCOUNTER — Ambulatory Visit: Payer: No Typology Code available for payment source | Admitting: Cardiology

## 2023-11-07 DIAGNOSIS — M79641 Pain in right hand: Secondary | ICD-10-CM | POA: Diagnosis not present

## 2023-11-16 DIAGNOSIS — J479 Bronchiectasis, uncomplicated: Secondary | ICD-10-CM | POA: Diagnosis not present

## 2023-11-16 DIAGNOSIS — G4733 Obstructive sleep apnea (adult) (pediatric): Secondary | ICD-10-CM | POA: Diagnosis not present

## 2023-11-16 DIAGNOSIS — J301 Allergic rhinitis due to pollen: Secondary | ICD-10-CM | POA: Diagnosis not present

## 2023-11-16 DIAGNOSIS — J4540 Moderate persistent asthma, uncomplicated: Secondary | ICD-10-CM | POA: Diagnosis not present

## 2023-11-17 ENCOUNTER — Ambulatory Visit (INDEPENDENT_AMBULATORY_CARE_PROVIDER_SITE_OTHER): Admitting: Podiatry

## 2023-11-17 DIAGNOSIS — M79676 Pain in unspecified toe(s): Secondary | ICD-10-CM

## 2023-11-20 NOTE — Progress Notes (Signed)
 Appt cancelled as per Odilia Bennett C at front desk today.

## 2023-11-22 DIAGNOSIS — M545 Low back pain, unspecified: Secondary | ICD-10-CM | POA: Diagnosis not present

## 2023-11-24 DIAGNOSIS — M545 Low back pain, unspecified: Secondary | ICD-10-CM | POA: Diagnosis not present

## 2023-11-28 ENCOUNTER — Ambulatory Visit (HOSPITAL_COMMUNITY): Payer: No Typology Code available for payment source | Admitting: Physician Assistant

## 2023-11-28 DIAGNOSIS — M545 Low back pain, unspecified: Secondary | ICD-10-CM | POA: Diagnosis not present

## 2023-12-01 DIAGNOSIS — M545 Low back pain, unspecified: Secondary | ICD-10-CM | POA: Diagnosis not present

## 2023-12-05 DIAGNOSIS — G4733 Obstructive sleep apnea (adult) (pediatric): Secondary | ICD-10-CM | POA: Diagnosis not present

## 2023-12-05 DIAGNOSIS — J301 Allergic rhinitis due to pollen: Secondary | ICD-10-CM | POA: Diagnosis not present

## 2023-12-05 DIAGNOSIS — J4540 Moderate persistent asthma, uncomplicated: Secondary | ICD-10-CM | POA: Diagnosis not present

## 2023-12-05 DIAGNOSIS — J479 Bronchiectasis, uncomplicated: Secondary | ICD-10-CM | POA: Diagnosis not present

## 2023-12-06 DIAGNOSIS — M545 Low back pain, unspecified: Secondary | ICD-10-CM | POA: Diagnosis not present

## 2023-12-08 DIAGNOSIS — M545 Low back pain, unspecified: Secondary | ICD-10-CM | POA: Diagnosis not present

## 2023-12-12 DIAGNOSIS — R635 Abnormal weight gain: Secondary | ICD-10-CM | POA: Diagnosis not present

## 2023-12-12 DIAGNOSIS — I10 Essential (primary) hypertension: Secondary | ICD-10-CM | POA: Diagnosis not present

## 2023-12-12 DIAGNOSIS — Z6834 Body mass index (BMI) 34.0-34.9, adult: Secondary | ICD-10-CM | POA: Diagnosis not present

## 2023-12-12 DIAGNOSIS — E119 Type 2 diabetes mellitus without complications: Secondary | ICD-10-CM | POA: Diagnosis not present

## 2023-12-12 DIAGNOSIS — I4891 Unspecified atrial fibrillation: Secondary | ICD-10-CM | POA: Diagnosis not present

## 2023-12-13 DIAGNOSIS — M545 Low back pain, unspecified: Secondary | ICD-10-CM | POA: Diagnosis not present

## 2023-12-16 DIAGNOSIS — M545 Low back pain, unspecified: Secondary | ICD-10-CM | POA: Diagnosis not present

## 2023-12-20 ENCOUNTER — Institutional Professional Consult (permissible substitution): Admitting: Plastic Surgery

## 2023-12-20 DIAGNOSIS — M545 Low back pain, unspecified: Secondary | ICD-10-CM | POA: Diagnosis not present

## 2023-12-22 DIAGNOSIS — M545 Low back pain, unspecified: Secondary | ICD-10-CM | POA: Diagnosis not present

## 2023-12-23 DIAGNOSIS — J479 Bronchiectasis, uncomplicated: Secondary | ICD-10-CM | POA: Diagnosis not present

## 2023-12-26 DIAGNOSIS — M545 Low back pain, unspecified: Secondary | ICD-10-CM | POA: Diagnosis not present

## 2023-12-29 DIAGNOSIS — M545 Low back pain, unspecified: Secondary | ICD-10-CM | POA: Diagnosis not present

## 2023-12-29 DIAGNOSIS — M797 Fibromyalgia: Secondary | ICD-10-CM | POA: Diagnosis not present

## 2024-01-03 DIAGNOSIS — M545 Low back pain, unspecified: Secondary | ICD-10-CM | POA: Diagnosis not present

## 2024-01-06 DIAGNOSIS — M545 Low back pain, unspecified: Secondary | ICD-10-CM | POA: Diagnosis not present

## 2024-01-09 DIAGNOSIS — M545 Low back pain, unspecified: Secondary | ICD-10-CM | POA: Diagnosis not present

## 2024-01-09 DIAGNOSIS — J301 Allergic rhinitis due to pollen: Secondary | ICD-10-CM | POA: Diagnosis not present

## 2024-01-09 DIAGNOSIS — J4540 Moderate persistent asthma, uncomplicated: Secondary | ICD-10-CM | POA: Diagnosis not present

## 2024-01-09 DIAGNOSIS — G4733 Obstructive sleep apnea (adult) (pediatric): Secondary | ICD-10-CM | POA: Diagnosis not present

## 2024-01-09 DIAGNOSIS — J479 Bronchiectasis, uncomplicated: Secondary | ICD-10-CM | POA: Diagnosis not present

## 2024-01-12 DIAGNOSIS — M545 Low back pain, unspecified: Secondary | ICD-10-CM | POA: Diagnosis not present

## 2024-01-18 DIAGNOSIS — M545 Low back pain, unspecified: Secondary | ICD-10-CM | POA: Diagnosis not present

## 2024-01-19 DIAGNOSIS — M3500 Sicca syndrome, unspecified: Secondary | ICD-10-CM | POA: Diagnosis not present

## 2024-01-25 DIAGNOSIS — M545 Low back pain, unspecified: Secondary | ICD-10-CM | POA: Diagnosis not present

## 2024-01-26 ENCOUNTER — Encounter: Payer: Self-pay | Admitting: Emergency Medicine

## 2024-01-30 NOTE — Progress Notes (Unsigned)
 Cardiology Office Note:  .   Date:  02/01/2024  ID:  [place] DELENA Fridge, DOB [birth year], MRN [id number] PCP: [facility] Providers Cardiologist:  None Electrophysiologist:  Will Gladis Gitlin, MD    History of Present Illness: DELANY STEURY is a 68 y.o. female with a past medical history of atrial fibrillation s/p ablation in 2019 and again in 2023, diastolic heart failure, history of DVT, hypertension, OSA on CPAP, GERD, DM 2, small PFO.  05/21/2022 atrial fibrillation ablation 05/13/2022 cardiac CT small PFO, calcium score 0 06/11/2020 echo EF 60 to 65%, LA moderately dilated, cannot rule out PFO, mitral valve is degenerative with trivial MR, mild aortic valve sclerosis is present without stenosis 03/09/2018 atrial fibrillation ablation 08/09/2017 left heart cath normal coronary arteries 07/21/2017 Lexiscan  medium defect of moderate severity findings consistent with ischemia >> false-positive stress evaluation  Evaluated 02/18/2023 for follow-up for atrial flutter, she was maintaining sinus rhythm, vacillating on whether or not to proceed with upcoming ablation.  Most recently evaluated by Dr. Hagerty on 07/18/2023, her Tikosyn  was stopped and she was started on amiodarone  as she was noted to be in atrial flutter with rapid rates.  Ablation scheduled for 10/26/2023, although this was ultimately canceled as she did not want to proceed at this time and plans were made for her to follow-up in EP around the end of summer.  She presents today for follow-up.  She has been doing well since she was last evaluated in our office, does not have any formal complaints from a cardiac perspective.  She was scheduled to have an ablation however she canceled this, she had a lot going on in her personal life but she does plan to follow back up with EP.  As far she knows she has been maintaining sinus rhythm, she stays very active.  Is tolerating her Eliquis  without any adverse  bleeding effects.  She denies chest pain, palpitations, dyspnea, pnd, orthopnea, n, v, dizziness, syncope, edema, weight gain, or early satiety.    ROS: Review of Systems  All other systems reviewed and are negative.    Studies Reviewed: SABRA   EKG Interpretation Date/Time:  Tuesday January 31 2024 11:43:34 EDT Ventricular Rate:  117 PR Interval:  126 QRS Duration:  94 QT Interval:  316 QTC Calculation: 440 R Axis:   96  Text Interpretation: Sinus tachycardia Rightward axis Nonspecific ST abnormality When compared with ECG of 18-Jul-2023 09:01, No significant change was found Confirmed by Carlin Nest [phone]) on 01/31/2024 11:45:47 AM    Cardiac Studies & Procedures   ______________________________________________________________________________________________ CARDIAC CATHETERIZATION  CARDIAC CATHETERIZATION 08/09/2017  Conclusion  Normal left main.  Normal left anterior descending coronary artery.  Normal circumflex coronary artery.  Normal dominant RCA.  Normal LV size with normal hemodynamics and estimated ejection fraction of 50%.  False positive myocardial perfusion imaging.  Mild pulmonary hypertension with mean pressure of 27 mmHg.  RECOMMENDATIONS:   Management of atrial fibrillation with aggressive rate control and stroke prophylaxis.  Findings Coronary Findings Diagnostic  Dominance: Right  Left Circumflex  First Obtuse Marginal Branch Vessel is small in size.  Second Obtuse Marginal Branch Vessel is large in size.  Third Obtuse Marginal Branch Vessel is small in size.  Intervention  No interventions have been documented.   STRESS TESTS  MYOCARDIAL PERFUSION IMAGING 07/22/2017  Interpretation Summary  Nuclear stress EF: 43%.  No T wave inversion was noted during stress.  There was no ST  segment deviation noted during stress.  Defect 1: There is a medium defect of moderate severity.  Findings consistent with ischemia.  This is an  intermediate risk study.  Medium size, moderate intensity reversible (SDS 5) mid to distal anterior and apical perfusion defect, suggestive of LAD territory ischemia. LVEF 43% with anterior, anteroseptal and apical hypokinesis. This is an intermediate risk study.   ECHOCARDIOGRAM  ECHOCARDIOGRAM COMPLETE 06/11/2020  Narrative ECHOCARDIOGRAM REPORT    Patient Name:   PAITON BOULTINGHOUSE Date of Exam: 06/11/2020 Medical Rec #:  [id number]        Height:       64.0 in Accession #:    [id number]       Weight:       214.6 lb Date of Birth:  Oct 07, 1955        BSA:          2.016 m Patient Age:    64 years         BP:           130/75 mmHg Patient Gender: F                HR:           63 bpm. Exam Location:  Outpatient  Procedure: 2D Echo  Indications:    atrial fibrillation  History:        Patient has prior history of Echocardiogram examinations, most recent 12/14/2018. CHF; Arrythmias:Atrial Fibrillation.  Sonographer:    Tinnie Barefoot RDCS Referring Phys: [phone] DONNA C CARROLL   Sonographer Comments: Image acquisition challenging due to patient body habitus. IMPRESSIONS   1. Left ventricular ejection fraction, by estimation, is 60 to 65%. The left ventricle has normal function. The left ventricle has no regional wall motion abnormalities. Left ventricular diastolic parameters were normal. 2. Right ventricular systolic function is normal. The right ventricular size is normal. There is normal pulmonary artery systolic pressure. 3. Left atrial size was moderately dilated. 4. Cannot r/o PFO. 5. The mitral valve is degenerative. Trivial mitral valve regurgitation. No evidence of mitral stenosis. 6. The aortic valve is tricuspid. Aortic valve regurgitation is not visualized. Mild aortic valve sclerosis is present, with no evidence of aortic valve stenosis. 7. The inferior vena cava is normal in size with greater than 50% respiratory variability, suggesting right atrial pressure of 3  mmHg.  FINDINGS Left Ventricle: Left ventricular ejection fraction, by estimation, is 60 to 65%. The left ventricle has normal function. The left ventricle has no regional wall motion abnormalities. The left ventricular internal cavity size was normal in size. There is no left ventricular hypertrophy. Left ventricular diastolic parameters were normal.  Right Ventricle: The right ventricular size is normal. No increase in right ventricular wall thickness. Right ventricular systolic function is normal. There is normal pulmonary artery systolic pressure. The tricuspid regurgitant velocity is 2.35 m/s, and with an assumed right atrial pressure of 3 mmHg, the estimated right ventricular systolic pressure is 25.1 mmHg.  Left Atrium: Left atrial size was moderately dilated.  Right Atrium: Right atrial size was normal in size.  Pericardium: There is no evidence of pericardial effusion.  Mitral Valve: The mitral valve is degenerative in appearance. There is mild thickening of the mitral valve leaflet(s). There is mild calcification of the mitral valve leaflet(s). Mild mitral annular calcification. Trivial mitral valve regurgitation. No evidence of mitral valve stenosis.  Tricuspid Valve: The tricuspid valve is normal in structure. Tricuspid valve regurgitation is mild . No evidence  of tricuspid stenosis.  Aortic Valve: The aortic valve is tricuspid. Aortic valve regurgitation is not visualized. Mild aortic valve sclerosis is present, with no evidence of aortic valve stenosis.  Pulmonic Valve: The pulmonic valve was normal in structure. Pulmonic valve regurgitation is not visualized. No evidence of pulmonic stenosis.  Aorta: The aortic root is normal in size and structure.  Venous: The inferior vena cava is normal in size with greater than 50% respiratory variability, suggesting right atrial pressure of 3 mmHg.  IAS/Shunts: The interatrial septum was not well visualized.   LEFT VENTRICLE PLAX  2D LVIDd:         4.90 cm  Diastology LVIDs:         3.20 cm  LV e' medial:    5.98 cm/s LV PW:         1.95 cm  LV E/e' medial:  12.4 LV IVS:        0.90 cm  LV e' lateral:   9.36 cm/s LVOT diam:     1.90 cm  LV E/e' lateral: 7.9 LV SV:         59 LV SV Index:   29 LVOT Area:     2.84 cm   RIGHT VENTRICLE             IVC RV S prime:     16.50 cm/s  IVC diam: 1.30 cm TAPSE (M-mode): 1.9 cm  LEFT ATRIUM             Index       RIGHT ATRIUM           Index LA diam:        4.40 cm 2.18 cm/m  RA Area:     12.80 cm LA Vol (A2C):   71.3 ml 35.37 ml/m RA Volume:   28.90 ml  14.34 ml/m LA Vol (A4C):   74.1 ml 36.76 ml/m LA Biplane Vol: 75.9 ml 37.65 ml/m AORTIC VALVE LVOT Vmax:   102.00 cm/s LVOT Vmean:  66.700 cm/s LVOT VTI:    0.209 m  AORTA Ao Root diam: 3.50 cm  MITRAL VALVE               TRICUSPID VALVE MV Area (PHT): 2.73 cm    TR Peak grad:   22.1 mmHg MV Decel Time: 278 msec    TR Vmax:        235.00 cm/s MV E velocity: 74.10 cm/s MV A velocity: 63.40 cm/s  SHUNTS MV E/A ratio:  1.17        Systemic VTI:  0.21 m Systemic Diam: 1.90 cm  Maude Emmer MD Electronically signed by Maude Emmer MD Signature Date/Time: 06/11/2020/12:40:30 PM    Final   TEE  ECHO TEE 03/09/2018  Narrative *[place]* *[facility]* [address] [place], [place] [address] [phone]  ------------------------------------------------------------------- Transesophageal Echocardiography  Patient:    Davanna, He MR #:       [id number] Study Date: 03/09/2018 Gender:     F Age:        9 Height:     167.6 cm Weight:     105.9 kg BSA:        2.27 m^2 Pt. Status: Room:       6E09C  PERFORMING   Aleene Passe, M.D. REFERRING    Aleene Passe, M.D. ATTENDING    Lynwood Rakers, MD ADMITTING    Nahser, Mickey FINES     Nahser, Jr SONOGRAPHER  Mal Nora  cc:  ------------------------------------------------------------------- LV EF: 40% -    45%  ------------------------------------------------------------------- Indications:      Atrial fibrillation - 427.31.  ------------------------------------------------------------------- History:   PMH:   Congestive heart failure.  ------------------------------------------------------------------- Study Conclusions  - Left ventricle: Systolic function was mildly to moderately reduced. The estimated ejection fraction was in the range of 40% to 45%. - Aortic valve: No evidence of vegetation. - Mitral valve: No evidence of vegetation. There was mild regurgitation. - Left atrium: No evidence of thrombus in the atrial cavity or appendage. - Atrial septum: No defect or patent foramen ovale was identified. - Tricuspid valve: No evidence of vegetation. - Pulmonic valve: No evidence of vegetation. No evidence of vegetation.  ------------------------------------------------------------------- Study data:   Study status:  Routine.  Consent:  The risks, benefits, and alternatives to the procedure were explained to the patient and informed consent was obtained.  Procedure:  The patient reported no pain pre or post test. Initial setup. The patient was brought to the laboratory. Surface ECG leads were monitored. Sedation. Conscious sedation was administered by cardiology staff. Transesophageal echocardiography. A transesophageal probe was inserted by the attending cardiologistwithout difficulty. Image quality was adequate.  Study completion:  The patient tolerated the procedure well. There were no complications.  Administered medications:   Midazolam , 3mg , IV.  Fentanyl , 50mcg, IV. Diagnostic transesophageal echocardiography.  2D and color Doppler. Birthdate:  Patient birthdate: 19-Feb-1956.  Age:  Patient is 68 yr old.  Sex:  Gender: female.    BMI: 37.7 kg/m^2.  Blood pressure: 100/69  Patient status:  Inpatient.  Study date:  Study date: 03/09/2018. Study time: 08:15 AM.  Location:   Endoscopy.  -------------------------------------------------------------------  ------------------------------------------------------------------- Left ventricle:  Systolic function was mildly to moderately reduced. The estimated ejection fraction was in the range of 40% to 45%.  ------------------------------------------------------------------- Aortic valve:   Structurally normal valve.   Cusp separation was normal.  No evidence of vegetation.  Doppler:  There was no regurgitation.  ------------------------------------------------------------------- Aorta:  The aorta was normal, not dilated, and non-diseased.  ------------------------------------------------------------------- Mitral valve:   Structurally normal valve.   Leaflet separation was normal.  No evidence of vegetation.  Doppler:  There was mild regurgitation.  ------------------------------------------------------------------- Left atrium:   No evidence of thrombus in the atrial cavity or appendage.  ------------------------------------------------------------------- Atrial septum:  No defect or patent foramen ovale was identified.  ------------------------------------------------------------------- Pulmonic valve:    Structurally normal valve.   Cusp separation was normal.  No evidence of vegetation.  No evidence of vegetation. Doppler:  There was trivial regurgitation.  ------------------------------------------------------------------- Tricuspid valve:   Structurally normal valve.   Leaflet separation was normal.  No evidence of vegetation.  Doppler:  There was mild regurgitation.  ------------------------------------------------------------------- Post procedure conclusions Ascending Aorta:  - The aorta was normal, not dilated, and non-diseased.  ------------------------------------------------------------------- Prepared and Electronically Authenticated by  Aleene Passe,  M.D. 2019-10-03T16:49:06  MONITORS  LONG TERM MONITOR (3-14 DAYS) 07/26/2022  Narrative Patch Wear Time:  6 days and 21 hours  100% atrial flutter burden Heart rate 57-179, average 112 No triggered episodes Less than 1% ventricular ectopy  Will Camnitz, MD       ______________________________________________________________________________________________      EKG Interpretation Date/Time:  Tuesday January 31 2024 11:43:34 EDT Ventricular Rate:  117 PR Interval:  126 QRS Duration:  94 QT Interval:  316 QTC Calculation: 440 R Axis:   96  Text Interpretation: Sinus tachycardia Rightward axis Nonspecific ST abnormality When compared with ECG of 18-Jul-2023 09:01,  No significant change was found Confirmed by Carlin Nest [phone]) on 01/31/2024 11:45:47 AM   EKG Interpretation Date/Time:  Tuesday January 31 2024 11:43:34 EDT Ventricular Rate:  117 PR Interval:  126 QRS Duration:  94 QT Interval:  316 QTC Calculation: 440 R Axis:   96  Text Interpretation: Sinus tachycardia Rightward axis Nonspecific ST abnormality When compared with ECG of 18-Jul-2023 09:01, No significant change was found Confirmed by Carlin Nest [phone]) on 01/31/2024 11:45:47 AM  EKG Interpretation Date/Time:  Tuesday January 31 2024 11:43:34 EDT Ventricular Rate:  117 PR Interval:  126 QRS Duration:  94 QT Interval:  316 QTC Calculation: 440 R Axis:   96  Text Interpretation: Sinus tachycardia Rightward axis Nonspecific ST abnormality When compared with ECG of 18-Jul-2023 09:01, No significant change was found Confirmed by Carlin Nest [phone]) on 01/31/2024 11:45:47 AM   Risk Assessment/Calculations:    CHA2DS2-VASc Score = 5   This indicates a 7.2% annual risk of stroke. The patient's score is based upon: CHF History: 1 HTN History: 1 Diabetes History: 1 Stroke History: 0 Vascular Disease History: 0 Age Score: 1 Gender Score: 1            Physical Exam:   VS:  BP 100/80   Pulse 80    Ht 5' 6.5 (1.689 m)   Wt 213 lb (96.6 kg)   SpO2 97%   BMI 33.86 kg/m    Wt Readings from Last 3 Encounters:  01/31/24 213 lb (96.6 kg)  08/01/23 223 lb (101.2 kg)  07/18/23 222 lb 9.6 oz (101 kg)    GEN: Well nourished, well developed in no acute distress NECK: No JVD; No carotid bruits CARDIAC: RRR, no murmurs, rubs, gallops RESPIRATORY:  Clear to auscultation without rales, wheezing or rhonchi  ABDOMEN: Soft, non-tender, non-distended EXTREMITIES:  No edema; No deformity   ASSESSMENT AND PLAN: .   Persistent atrial fibrillation/flutter/hypercoagulable state/high risk medication use: S/p ablation in 2019 and again in 2023.  CHA2DS2-VASc score of 4, her heart rate is elevated today. Continue Eliquis  5 mg twice daily--no indication for dose reduction, will repeat CBC today, BMET today.  Continue diltiazem  120 mg twice daily.  Continue metoprolol  25 mg daily.  Taking amiodarone  200 mg daily--will repeat LFTs.  Will arrange for her to get set back up with EP.  Obesity-BMI 33, she is currently working with a weight management team to help her lose weight, is lost approximately 10 pounds.  She understands that this will help with her atrial fibrillation/flutter. Heart healthy diet and regular cardiovascular exercise encouraged.    HFpEF -most recent echo in 2022 revealed an EF of 60 to 65%, NYHA class I, euvolemic.  Continue Lasix , continue metoprolol  25 mg daily.  OSA-compliant with CPAP.    Disposition-CBC, BMET, lipid panel, schedule appointment to follow-up with EP to discuss repeat ablation, follow-up with general cardiology in 6 months.       Signed, Nest JAYSON Carlin, NP

## 2024-01-31 ENCOUNTER — Ambulatory Visit: Attending: Cardiology | Admitting: Cardiology

## 2024-01-31 ENCOUNTER — Encounter: Payer: Self-pay | Admitting: Cardiology

## 2024-01-31 VITALS — BP 100/80 | HR 80 | Ht 66.5 in | Wt 213.0 lb

## 2024-01-31 DIAGNOSIS — I4819 Other persistent atrial fibrillation: Secondary | ICD-10-CM

## 2024-01-31 DIAGNOSIS — E6609 Other obesity due to excess calories: Secondary | ICD-10-CM

## 2024-01-31 DIAGNOSIS — I4891 Unspecified atrial fibrillation: Secondary | ICD-10-CM

## 2024-01-31 DIAGNOSIS — Z79899 Other long term (current) drug therapy: Secondary | ICD-10-CM | POA: Diagnosis not present

## 2024-01-31 DIAGNOSIS — I5032 Chronic diastolic (congestive) heart failure: Secondary | ICD-10-CM

## 2024-01-31 DIAGNOSIS — D6859 Other primary thrombophilia: Secondary | ICD-10-CM

## 2024-01-31 DIAGNOSIS — E66812 Obesity, class 2: Secondary | ICD-10-CM | POA: Diagnosis not present

## 2024-01-31 DIAGNOSIS — Z6838 Body mass index (BMI) 38.0-38.9, adult: Secondary | ICD-10-CM | POA: Diagnosis not present

## 2024-01-31 NOTE — Patient Instructions (Signed)
 Medication Instructions:  Your physician recommends that you continue on your current medications as directed. Please refer to the Current Medication list given to you today.  *If you need a refill on your cardiac medications before your next appointment, please call your pharmacy*  Lab Work: Your physician recommends that you return for lab work in: Today for CMP, CBC and Fasting Lipid Panel  If you have labs (blood work) drawn today and your tests are completely normal, you will receive your results only by: MyChart Message (if you have MyChart) OR A paper copy in the mail If you have any lab test that is abnormal or we need to change your treatment, we will call you to review the results.  Testing/Procedures: NONE  Follow-Up: At [facility], you and your health needs are our priority.  As part of our continuing mission to provide you with exceptional heart care, our providers are all part of one team.  This team includes your primary Cardiologist (physician) and Advanced Practice Providers or APPs (Physician Assistants and Nurse Practitioners) who all work together to provide you with the care you need, when you need it.  Your next appointment:   6 month(s)  Provider:   Lamar Fitch, MD    We recommend signing up for the patient portal called MyChart.  Sign up information is provided on this After Visit Summary.  MyChart is used to connect with patients for Virtual Visits (Telemedicine).  Patients are able to view lab/test results, encounter notes, upcoming appointments, etc.  Non-urgent messages can be sent to your provider as well.   To learn more about what you can do with MyChart, go to [email or web address].   Other Instructions

## 2024-02-01 ENCOUNTER — Ambulatory Visit: Payer: Self-pay | Admitting: Cardiology

## 2024-02-01 DIAGNOSIS — M545 Low back pain, unspecified: Secondary | ICD-10-CM | POA: Diagnosis not present

## 2024-02-01 LAB — CBC WITH DIFFERENTIAL/PLATELET
Basophils Absolute: 0.1 x10E3/uL (ref 0.0–0.2)
Basos: 1 %
EOS (ABSOLUTE): 0.1 x10E3/uL (ref 0.0–0.4)
Eos: 1 %
Hematocrit: 44.1 % (ref 34.0–46.6)
Hemoglobin: 14.5 g/dL (ref 11.1–15.9)
Immature Grans (Abs): 0 x10E3/uL (ref 0.0–0.1)
Immature Granulocytes: 0 %
Lymphocytes Absolute: 1.9 x10E3/uL (ref 0.7–3.1)
Lymphs: 27 %
MCH: 31.2 pg (ref 26.6–33.0)
MCHC: 32.9 g/dL (ref 31.5–35.7)
MCV: 95 fL (ref 79–97)
Monocytes Absolute: 0.7 x10E3/uL (ref 0.1–0.9)
Monocytes: 9 %
Neutrophils Absolute: 4.4 x10E3/uL (ref 1.4–7.0)
Neutrophils: 62 %
Platelets: 270 x10E3/uL (ref 150–450)
RBC: 4.65 x10E6/uL (ref 3.77–5.28)
RDW: 13.4 % (ref 11.7–15.4)
WBC: 7.1 x10E3/uL (ref 3.4–10.8)

## 2024-02-01 LAB — COMPREHENSIVE METABOLIC PANEL WITH GFR
ALT: 31 IU/L (ref 0–32)
AST: 32 IU/L (ref 0–40)
Albumin: 4.2 g/dL (ref 3.9–4.9)
Alkaline Phosphatase: 89 IU/L (ref 44–121)
BUN/Creatinine Ratio: 20 (ref 12–28)
BUN: 17 mg/dL (ref 8–27)
Bilirubin Total: 0.4 mg/dL (ref 0.0–1.2)
CO2: 22 mmol/L (ref 20–29)
Calcium: 9.5 mg/dL (ref 8.7–10.3)
Chloride: 104 mmol/L (ref 96–106)
Creatinine, Ser: 0.87 mg/dL (ref 0.57–1.00)
Globulin, Total: 2.4 g/dL (ref 1.5–4.5)
Glucose: 79 mg/dL (ref 70–99)
Potassium: 4.6 mmol/L (ref 3.5–5.2)
Sodium: 141 mmol/L (ref 134–144)
Total Protein: 6.6 g/dL (ref 6.0–8.5)
eGFR: 73 mL/min/1.73

## 2024-02-01 LAB — LIPID PANEL
Chol/HDL Ratio: 4.7 ratio — ABNORMAL HIGH (ref 0.0–4.4)
Cholesterol, Total: 146 mg/dL (ref 100–199)
HDL: 31 mg/dL — ABNORMAL LOW
LDL Chol Calc (NIH): 91 mg/dL (ref 0–99)
Triglycerides: 131 mg/dL (ref 0–149)
VLDL Cholesterol Cal: 24 mg/dL (ref 5–40)

## 2024-02-07 DIAGNOSIS — G629 Polyneuropathy, unspecified: Secondary | ICD-10-CM | POA: Diagnosis not present

## 2024-02-07 DIAGNOSIS — M797 Fibromyalgia: Secondary | ICD-10-CM | POA: Diagnosis not present

## 2024-02-07 DIAGNOSIS — Z79891 Long term (current) use of opiate analgesic: Secondary | ICD-10-CM | POA: Diagnosis not present

## 2024-02-08 DIAGNOSIS — M545 Low back pain, unspecified: Secondary | ICD-10-CM | POA: Diagnosis not present

## 2024-02-13 DIAGNOSIS — Z6834 Body mass index (BMI) 34.0-34.9, adult: Secondary | ICD-10-CM | POA: Diagnosis not present

## 2024-02-13 DIAGNOSIS — I10 Essential (primary) hypertension: Secondary | ICD-10-CM | POA: Diagnosis not present

## 2024-02-13 DIAGNOSIS — E119 Type 2 diabetes mellitus without complications: Secondary | ICD-10-CM | POA: Diagnosis not present

## 2024-02-13 DIAGNOSIS — I4891 Unspecified atrial fibrillation: Secondary | ICD-10-CM | POA: Diagnosis not present

## 2024-02-15 DIAGNOSIS — M545 Low back pain, unspecified: Secondary | ICD-10-CM | POA: Diagnosis not present

## 2024-02-22 DIAGNOSIS — M545 Low back pain, unspecified: Secondary | ICD-10-CM | POA: Diagnosis not present

## 2024-02-28 ENCOUNTER — Institutional Professional Consult (permissible substitution): Admitting: Plastic Surgery

## 2024-03-02 DIAGNOSIS — G4733 Obstructive sleep apnea (adult) (pediatric): Secondary | ICD-10-CM | POA: Diagnosis not present

## 2024-03-02 DIAGNOSIS — J301 Allergic rhinitis due to pollen: Secondary | ICD-10-CM | POA: Diagnosis not present

## 2024-03-02 DIAGNOSIS — J4540 Moderate persistent asthma, uncomplicated: Secondary | ICD-10-CM | POA: Diagnosis not present

## 2024-03-02 DIAGNOSIS — R918 Other nonspecific abnormal finding of lung field: Secondary | ICD-10-CM | POA: Diagnosis not present

## 2024-03-09 ENCOUNTER — Other Ambulatory Visit: Payer: Self-pay | Admitting: Cardiovascular Disease

## 2024-03-09 DIAGNOSIS — Z0000 Encounter for general adult medical examination without abnormal findings: Secondary | ICD-10-CM

## 2024-03-19 ENCOUNTER — Telehealth: Payer: Self-pay | Admitting: Cardiology

## 2024-03-19 DIAGNOSIS — M797 Fibromyalgia: Secondary | ICD-10-CM | POA: Diagnosis not present

## 2024-03-19 DIAGNOSIS — G629 Polyneuropathy, unspecified: Secondary | ICD-10-CM | POA: Diagnosis not present

## 2024-03-19 MED ORDER — AMIODARONE HCL 200 MG PO TABS: ORAL_TABLET | ORAL | 1 refills | Status: DC

## 2024-03-19 MED ORDER — METOPROLOL SUCCINATE ER 50 MG PO TB24: 25.0000 mg | ORAL_TABLET | Freq: Every day | ORAL | 1 refills | Status: AC

## 2024-03-19 NOTE — Telephone Encounter (Signed)
*  STAT* If patient is at the pharmacy, call can be transferred to refill team.   1. Which medications need to be refilled? (please list name of each medication and dose if known)   metoprolol  succinate (TOPROL -XL) 50 MG 24 hr tablet     2. Would you like to learn more about the convenience, safety, & potential cost savings by using the [facility]? no   3. Are you open to using the [facility] (Type [facility]. ).no   4. Which pharmacy/location (including street and city if local pharmacy) is medication to be sent to?[facility] [phone] - [place], [place] - [address]    5. Do they need a 30 day or 90 day supply? 90 day    Pt is out of medication

## 2024-03-19 NOTE — Telephone Encounter (Signed)
 Pt's medication was sent to pt's pharmacy as requested. Confirmation received.

## 2024-03-19 NOTE — Telephone Encounter (Signed)
 RX sent in

## 2024-03-19 NOTE — Telephone Encounter (Signed)
*  STAT* If patient is at the pharmacy, call can be transferred to refill team.   1. Which medications need to be refilled? (please list name of each medication and dose if known)   amiodarone  (PACERONE ) 200 MG tablet    4. Which pharmacy/location (including street and city if local pharmacy) is medication to be sent to?  [id number] - [place], [place] - [address]     5. Do they need a 30 day or 90 day supply? 90    Per pt daughter, pt lost her rx

## 2024-03-21 ENCOUNTER — Ambulatory Visit (INDEPENDENT_AMBULATORY_CARE_PROVIDER_SITE_OTHER): Payer: PRIVATE HEALTH INSURANCE

## 2024-03-21 DIAGNOSIS — Z0000 Encounter for general adult medical examination without abnormal findings: Secondary | ICD-10-CM

## 2024-05-21 ENCOUNTER — Encounter: Payer: Self-pay | Admitting: Cardiology

## 2024-05-21 ENCOUNTER — Ambulatory Visit: Admitting: Cardiology

## 2024-05-21 VITALS — BP 130/74 | HR 109 | Ht 66.5 in | Wt 208.0 lb

## 2024-05-21 DIAGNOSIS — I4819 Other persistent atrial fibrillation: Secondary | ICD-10-CM

## 2024-05-21 DIAGNOSIS — D6869 Other thrombophilia: Secondary | ICD-10-CM | POA: Diagnosis not present

## 2024-05-21 DIAGNOSIS — Z79899 Other long term (current) drug therapy: Secondary | ICD-10-CM

## 2024-05-21 DIAGNOSIS — I484 Atypical atrial flutter: Secondary | ICD-10-CM | POA: Diagnosis not present

## 2024-05-21 DIAGNOSIS — G4733 Obstructive sleep apnea (adult) (pediatric): Secondary | ICD-10-CM | POA: Diagnosis not present

## 2024-05-21 NOTE — Patient Instructions (Addendum)
 Medication Instructions:  Your physician recommends that you continue on your current medications as directed. Please refer to the Current Medication list given to you today.  *If you need a refill on your cardiac medications before your next appointment, please call your pharmacy*  Lab Work: Pre ablation blood work: to be scheduled  If you have any lab test that is abnormal or we need to change your treatment, we will call you to review the results.  Testing/Procedures: Your physician has requested that you have cardiac CT 3 weeks prior to your ablation. Cardiac computed tomography (CT) is a painless test that uses an x-ray machine to take clear, detailed pictures of your heart. For further information please visit [email or web address] Please follow instruction sheet as given.  Someone will be in touch to schedule this testing.  Your physician has recommended that you have a repeat ablation. Catheter ablation is a medical procedure used to treat some cardiac arrhythmias (irregular heartbeats). During catheter ablation, a long, thin, flexible tube is put into a blood vessel in your groin (upper thigh), or neck. This tube is called an ablation catheter. It is then guided to your heart through the blood vessel. Radio frequency waves destroy small areas of heart tissue where abnormal heartbeats may cause an arrhythmia to start.   You will be scheduled for April -- someone will call you to arrange once that month is open/available, arrive to [facility] at ________ .  The office will send your instructions via mychart.    Follow-Up: At [facility], you and your health needs are our priority.  As part of our continuing mission to provide you with exceptional heart care, our providers are all part of one team.  This team includes your primary Cardiologist (physician) and Advanced Practice Providers or APPs (Physician Assistants and Nurse Practitioners) who all work together to provide  you with the care you need, when you need it.  Your next appointment:   1 month(s) after your ablation  Provider:   You will follow up in the Atrial Fibrillation Clinic located at [facility]. Your provider will be: Clint R. Fenton, PA-C or Fairy Heinrich, PA-C     Thank you for choosing [employer]!!   Maeola Domino, RN [phone]

## 2024-05-21 NOTE — Progress Notes (Signed)
 Electrophysiology Office Note:   Date:  05/21/2024  ID:  Christie Atkinson, DOB [birth year], MRN [id number]  Primary Cardiologist: Lamar Fitch, MD Primary Heart Failure: None Electrophysiologist: Sherian Valenza Gladis Ambrosio, MD      History of Present Illness:   Christie Atkinson is a 68 y.o. female with h/o atrial fibrillation, hypertension, diabetes, sleep apnea seen today for routine electrophysiology followup.   Discussed the use of AI scribe software for clinical note transcription with the patient, who gave verbal consent to proceed.  History of Present Illness Christie Atkinson is a 68 year old female with atrial flutter who presents for evaluation of her heart rhythm.  She experiences a high heart rate and is aware of being in atrial flutter. She feels tired at times but remains active. She has not been taking her diuretics due to concerns about her kidneys. She is focusing on fasting to help with weight loss.  She feels somewhat tired and fatigued that she relates to atrial fibrillation/flutter.  She notes a change in her physical activity over the past year, mentioning that she has not mowed the yard much due to only having a push mower. She has a dog that keeps her busy, which she describes as a military-trained dog that is 'pretty hard' and 'stubborn'.  She is currently taking metoprolol , 25 mg, but only takes half a pill a day. Increasing the dose in the past has led to her blood pressure dropping too low, although it does not seem to affect her heart rate significantly. She sometimes takes a little more than prescribed but is cautious about her blood pressure.   she denies chest pain, palpitations, dyspnea, PND, orthopnea, nausea, vomiting, dizziness, syncope, edema, weight gain, or early satiety.   Review of systems complete and found to be negative unless listed in HPI.   EP Information / Studies Reviewed:    EKG is ordered today. Personal review as below.  EKG  Interpretation Date/Time:  Monday May 21 2024 11:55:08 EST Ventricular Rate:  109 PR Interval:    QRS Duration:  98 QT Interval:  380 QTC Calculation: 511 R Axis:   104  Text Interpretation: Atrial flutter with variable A-V block Rightward axis When compared with ECG of 31-Jan-2024 11:43, Atrial flutter Confirmed by Kennice Finnie ([id number]) on 05/21/2024 12:01:45 PM    Risk Assessment/Calculations:    CHA2DS2-VASc Score = 5   This indicates a 7.2% annual risk of stroke. The patient's score is based upon: CHF History: 1 HTN History: 1 Diabetes History: 1 Stroke History: 0 Vascular Disease History: 0 Age Score: 1 Gender Score: 1            Physical Exam:   VS:  BP 130/74 (BP Location: Left Arm, Patient Position: Sitting, Cuff Size: Large)   Pulse (!) 109   Ht 5' 6.5 (1.689 m)   Wt 208 lb (94.3 kg)   SpO2 97%   BMI 33.07 kg/m    Wt Readings from Last 3 Encounters:  05/21/24 208 lb (94.3 kg)  01/31/24 213 lb (96.6 kg)  08/01/23 223 lb (101.2 kg)     GEN: Well nourished, well developed in no acute distress NECK: No JVD; No carotid bruits CARDIAC: Irregularly irregular rate and rhythm, no murmurs, rubs, gallops RESPIRATORY:  Clear to auscultation without rales, wheezing or rhonchi  ABDOMEN: Soft, non-tender, non-distended EXTREMITIES:  No edema; No deformity   ASSESSMENT AND PLAN:    1.  Persistent atrial fibrillation/flutter: On amiodarone .  Post ablation  in 2019 and 2023.  She remains in atrial flutter.  She feels poor with significant fatigue and shortness of breath.  She would prefer rhythm control.  Due to that, we Jerline Linzy plan for ablation.  She is on amiodarone  and this is not controlling her arrhythmia.  Risk, benefits, and alternatives to EP study and radiofrequency/pulse field ablation for afib were also discussed in detail today. These risks include but are not limited to stroke, bleeding, vascular damage, tamponade, perforation, damage to the esophagus,  lungs, and other structures, pulmonary vein stenosis, worsening renal function, and death. The patient understands these risk and wishes to proceed.  We Sabatino Williard therefore proceed with catheter ablation at the next available time.  Carto, ICE, anesthesia are requested for the procedure.  This patient Nyanna Heideman require CT prior to ablation. To be scheduled.   2.  Obstructive sleep apnea: Using oral device.  Compliance encouraged  3.  Secondary hypercoagulable state: On Xarelto  4.  High risk medication monitoring: On amiodarone .  Meilech Virts check TSH and LFTs today.  Follow up with EP Team as usual post procedure  Signed, Katiria Calame Gladis Manger, MD

## 2024-06-01 ENCOUNTER — Other Ambulatory Visit: Payer: Self-pay | Admitting: Cardiology
